# Patient Record
Sex: Male | Born: 1945 | Race: White | Hispanic: No | Marital: Married
Health system: Southern US, Academic
[De-identification: ages and names within clinical notes are randomized; demographics above are authoritative.]

## PROBLEM LIST (undated history)

## (undated) ENCOUNTER — Ambulatory Visit: Payer: MEDICARE

## (undated) ENCOUNTER — Encounter

## (undated) ENCOUNTER — Encounter: Attending: Family | Primary: Family

## (undated) ENCOUNTER — Telehealth

## (undated) ENCOUNTER — Encounter: Attending: Anatomic Pathology & Clinical Pathology | Primary: Anatomic Pathology & Clinical Pathology

## (undated) ENCOUNTER — Telehealth: Attending: Medical Oncology | Primary: Medical Oncology

## (undated) ENCOUNTER — Ambulatory Visit

## (undated) ENCOUNTER — Ambulatory Visit: Payer: MEDICARE | Attending: Neurological Surgery | Primary: Neurological Surgery

## (undated) ENCOUNTER — Telehealth
Attending: Student in an Organized Health Care Education/Training Program | Primary: Student in an Organized Health Care Education/Training Program

## (undated) ENCOUNTER — Ambulatory Visit
Attending: Student in an Organized Health Care Education/Training Program | Primary: Student in an Organized Health Care Education/Training Program

## (undated) DIAGNOSIS — I350 Nonrheumatic aortic (valve) stenosis: Secondary | ICD-10-CM

## (undated) DIAGNOSIS — I10 Essential (primary) hypertension: Secondary | ICD-10-CM

## (undated) DIAGNOSIS — M4800 Spinal stenosis, site unspecified: Secondary | ICD-10-CM

## (undated) DIAGNOSIS — Z85528 Personal history of other malignant neoplasm of kidney: Secondary | ICD-10-CM

## (undated) DIAGNOSIS — N184 Chronic kidney disease, stage 4 (severe): Secondary | ICD-10-CM

## (undated) DIAGNOSIS — I739 Peripheral vascular disease, unspecified: Secondary | ICD-10-CM

## (undated) DIAGNOSIS — K449 Diaphragmatic hernia without obstruction or gangrene: Secondary | ICD-10-CM

## (undated) DIAGNOSIS — I441 Atrioventricular block, second degree: Secondary | ICD-10-CM

## (undated) DIAGNOSIS — F410 Panic disorder [episodic paroxysmal anxiety] without agoraphobia: Secondary | ICD-10-CM

## (undated) DIAGNOSIS — Z8719 Personal history of other diseases of the digestive system: Secondary | ICD-10-CM

## (undated) DIAGNOSIS — R413 Other amnesia: Secondary | ICD-10-CM

## (undated) DIAGNOSIS — M3131 Wegener's granulomatosis with renal involvement: Secondary | ICD-10-CM

## (undated) DIAGNOSIS — F419 Anxiety disorder, unspecified: Secondary | ICD-10-CM

## (undated) DIAGNOSIS — N200 Calculus of kidney: Secondary | ICD-10-CM

## (undated) DIAGNOSIS — I4821 Permanent atrial fibrillation: Secondary | ICD-10-CM

## (undated) DIAGNOSIS — E6609 Other obesity due to excess calories: Secondary | ICD-10-CM

## (undated) DIAGNOSIS — I4891 Unspecified atrial fibrillation: Secondary | ICD-10-CM

## (undated) DIAGNOSIS — J449 Chronic obstructive pulmonary disease, unspecified: Secondary | ICD-10-CM

## (undated) DIAGNOSIS — E559 Vitamin D deficiency, unspecified: Secondary | ICD-10-CM

## (undated) DIAGNOSIS — M26629 Arthralgia of temporomandibular joint, unspecified side: Secondary | ICD-10-CM

## (undated) DIAGNOSIS — R001 Bradycardia, unspecified: Secondary | ICD-10-CM

## (undated) DIAGNOSIS — N189 Chronic kidney disease, unspecified: Secondary | ICD-10-CM

## (undated) DIAGNOSIS — I2510 Atherosclerotic heart disease of native coronary artery without angina pectoris: Secondary | ICD-10-CM

## (undated) DIAGNOSIS — I5022 Chronic systolic (congestive) heart failure: Secondary | ICD-10-CM

## (undated) DIAGNOSIS — Z950 Presence of cardiac pacemaker: Secondary | ICD-10-CM

## (undated) DIAGNOSIS — M109 Gout, unspecified: Secondary | ICD-10-CM

## (undated) DIAGNOSIS — E785 Hyperlipidemia, unspecified: Secondary | ICD-10-CM

## (undated) DIAGNOSIS — Z87442 Personal history of urinary calculi: Secondary | ICD-10-CM

## (undated) DIAGNOSIS — K8050 Calculus of bile duct without cholangitis or cholecystitis without obstruction: Secondary | ICD-10-CM

## (undated) DIAGNOSIS — K429 Umbilical hernia without obstruction or gangrene: Secondary | ICD-10-CM

## (undated) DIAGNOSIS — K219 Gastro-esophageal reflux disease without esophagitis: Secondary | ICD-10-CM

## (undated) DIAGNOSIS — G8929 Other chronic pain: Secondary | ICD-10-CM

## (undated) DIAGNOSIS — R55 Syncope and collapse: Secondary | ICD-10-CM

## (undated) DIAGNOSIS — M25559 Pain in unspecified hip: Secondary | ICD-10-CM

## (undated) HISTORY — PX: NOSE SURGERY: SHX723

## (undated) HISTORY — PX: CHOLECYSTECTOMY: SHX55

## (undated) HISTORY — DX: Umbilical hernia without obstruction or gangrene: K42.9

## (undated) HISTORY — DX: Hyperlipidemia, unspecified: E78.5

## (undated) HISTORY — DX: Vitamin D deficiency, unspecified: E55.9

## (undated) HISTORY — DX: Arthralgia of temporomandibular joint, unspecified side: M26.629

## (undated) HISTORY — DX: Chronic kidney disease, stage 4 (severe): N18.4

## (undated) HISTORY — PX: PROSTATE SURGERY: SHX751

## (undated) HISTORY — DX: Chronic kidney disease, unspecified: N18.9

## (undated) HISTORY — PX: HERNIA REPAIR: SHX51

## (undated) HISTORY — DX: Chronic obstructive pulmonary disease, unspecified: J44.9

## (undated) HISTORY — DX: Gout, unspecified: M10.9

## (undated) HISTORY — PX: CARDIAC CATHETERIZATION: SHX172

## (undated) HISTORY — DX: Nonrheumatic aortic (valve) stenosis: I35.0

## (undated) HISTORY — DX: Atherosclerotic heart disease of native coronary artery without angina pectoris: I25.10

## (undated) HISTORY — PX: EYE SURGERY: SHX253

## (undated) HISTORY — DX: Atrioventricular block, second degree: I44.1

## (undated) HISTORY — PX: HIP SURGERY: SHX245

## (undated) HISTORY — DX: Calculus of kidney: N20.0

## (undated) HISTORY — PX: KIDNEY SURGERY: SHX687

## (undated) HISTORY — DX: Other obesity due to excess calories: E66.09

## (undated) HISTORY — DX: Diaphragmatic hernia without obstruction or gangrene: K44.9

## (undated) HISTORY — DX: Calculus of bile duct without cholangitis or cholecystitis without obstruction: K80.50

## (undated) HISTORY — DX: Essential (primary) hypertension: I10

## (undated) HISTORY — DX: Spinal stenosis, site unspecified: M48.00

## (undated) HISTORY — DX: Gastro-esophageal reflux disease without esophagitis: K21.9

## (undated) HISTORY — PX: INTRAOCULAR LENS INSERTION: SHX110

## (undated) HISTORY — DX: Wegener's granulomatosis with renal involvement: M31.31

## (undated) HISTORY — DX: Personal history of other diseases of the digestive system: Z87.19

---
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
## 1998-09-10 ENCOUNTER — Ambulatory Visit (HOSPITAL_COMMUNITY): Admission: RE | Admit: 1998-09-10 | Discharge: 1998-09-10 | Payer: Self-pay | Admitting: *Deleted

## 2002-12-18 HISTORY — PX: CORONARY ARTERY BYPASS GRAFT: SHX141

## 2006-01-08 ENCOUNTER — Encounter: Admission: RE | Admit: 2006-01-08 | Discharge: 2006-01-08 | Payer: Self-pay | Admitting: Cardiovascular Disease

## 2006-01-12 ENCOUNTER — Inpatient Hospital Stay (HOSPITAL_COMMUNITY): Admission: RE | Admit: 2006-01-12 | Discharge: 2006-01-20 | Payer: Self-pay | Admitting: Cardiovascular Disease

## 2006-02-09 ENCOUNTER — Encounter: Admission: RE | Admit: 2006-02-09 | Discharge: 2006-02-09 | Payer: Self-pay | Admitting: Cardiothoracic Surgery

## 2006-07-31 ENCOUNTER — Inpatient Hospital Stay (HOSPITAL_COMMUNITY): Admission: RE | Admit: 2006-07-31 | Discharge: 2006-08-03 | Payer: Self-pay | Admitting: Orthopaedic Surgery

## 2006-08-06 ENCOUNTER — Ambulatory Visit: Payer: Self-pay | Admitting: Orthopaedic Surgery

## 2006-11-30 ENCOUNTER — Emergency Department: Payer: Self-pay | Admitting: Unknown Physician Specialty

## 2007-02-01 ENCOUNTER — Other Ambulatory Visit: Payer: Self-pay

## 2007-02-08 ENCOUNTER — Ambulatory Visit: Payer: Self-pay | Admitting: Surgery

## 2007-11-22 ENCOUNTER — Ambulatory Visit: Payer: Self-pay | Admitting: Endocrinology

## 2008-01-02 ENCOUNTER — Encounter: Payer: Self-pay | Admitting: Cardiovascular Disease

## 2008-01-03 ENCOUNTER — Encounter: Payer: Self-pay | Admitting: Cardiovascular Disease

## 2008-10-08 ENCOUNTER — Ambulatory Visit: Payer: Self-pay | Admitting: Urology

## 2008-10-14 ENCOUNTER — Ambulatory Visit: Payer: Self-pay | Admitting: Urology

## 2009-02-08 ENCOUNTER — Encounter: Admission: RE | Admit: 2009-02-08 | Discharge: 2009-02-08 | Payer: Self-pay | Admitting: Orthopaedic Surgery

## 2009-11-19 ENCOUNTER — Encounter: Payer: Self-pay | Admitting: Cardiovascular Disease

## 2011-01-08 ENCOUNTER — Encounter: Payer: Self-pay | Admitting: Orthopaedic Surgery

## 2011-07-14 ENCOUNTER — Ambulatory Visit: Payer: Self-pay | Admitting: Urology

## 2011-07-17 ENCOUNTER — Ambulatory Visit: Payer: Self-pay | Admitting: Urology

## 2011-12-25 ENCOUNTER — Ambulatory Visit: Payer: Self-pay | Admitting: Cardiovascular Disease

## 2012-01-08 ENCOUNTER — Encounter: Payer: Self-pay | Admitting: Cardiovascular Disease

## 2012-01-08 ENCOUNTER — Ambulatory Visit (INDEPENDENT_AMBULATORY_CARE_PROVIDER_SITE_OTHER): Payer: Medicare Other | Admitting: Cardiovascular Disease

## 2012-01-08 DIAGNOSIS — Z951 Presence of aortocoronary bypass graft: Secondary | ICD-10-CM | POA: Insufficient documentation

## 2012-01-08 DIAGNOSIS — E785 Hyperlipidemia, unspecified: Secondary | ICD-10-CM | POA: Insufficient documentation

## 2012-01-08 DIAGNOSIS — I10 Essential (primary) hypertension: Secondary | ICD-10-CM | POA: Insufficient documentation

## 2012-01-08 DIAGNOSIS — I2510 Atherosclerotic heart disease of native coronary artery without angina pectoris: Secondary | ICD-10-CM | POA: Insufficient documentation

## 2012-01-08 NOTE — Assessment & Plan Note (Signed)
Blood pressure is well controlled on today's visit. No changes made to the medications. 

## 2012-01-08 NOTE — Patient Instructions (Signed)
You are doing well. Please take a 1/2 tab of vytorin once a day If you have muscle ache, take a few days off.  Try red yeast rice for cholesterol  Please call us if you have new issues that need to be addressed before your next appt.  Your physician wants you to follow-up in: 6 months.  You will receive a reminder letter in the mail two months in advance. If you don't receive a letter, please call our office to schedule the follow-up appointment.

## 2012-01-08 NOTE — Assessment & Plan Note (Signed)
Cholesterol is elevated. Is not taking any medications. He did not tolerate pravastatin or Crestor. He did not tolerate simvastatin 40 mg. We will try vytorin 10/40 mg cut in half to start.

## 2012-01-08 NOTE — Assessment & Plan Note (Signed)
Bypass performed in 2007 at [facility]. No complications since that time.

## 2012-01-08 NOTE — Assessment & Plan Note (Addendum)
Severe proximal LAD and distal RCA disease in 2007, status post bypass. Rare episodes of chest pain. We did discuss whether he needs a stress test. He is not sure if he needs one at this time. We have asked him to call our office if he has any worsening chest discomfort and we would schedule a study. This could also be done at his primary care physician's office.  We have suggested he try low-dose aspirin.

## 2012-01-08 NOTE — Progress Notes (Signed)
[name], male    DOB: Sep 27, 1946, 66 y.o.   MRN: [id number]  HPI Comments: Jesus Hendrix is a very pleasant 66 year old gentleman, patient of Dr. 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  He denies any active chest pain though does report having episodes of chest pain over the past several months. No significant shortness of breath though he has not been very active secondary to his back with significant limitations in his back and legs. He is unable to do any exercise secondary to severe back pain. He is considering surgery.  Previous cardiac catheterization in January 2007 details 75-80% long lesion in the LAD at the ostium, 95% lesion in the PDA. He was referred to bypass surgery.  Previous echocardiogram January 2009 showed mild MR, normal LV function  Stress test January 2009 showed large region of decreased perfusion mild to moderate in severity in the inferior wall consistent with possible old MI. Defect worse in the inferoapical region. No ischemia. Ejection fraction 55%. Exercised for 7 METS  Recent blood work showed total cholesterol 246, LDL 158, HDL 45, TSH 1.8, normal LFTs, vitamin D 28th, fasting glucose 131  EKG shows normal sinus rhythm with rate 67 beats per minute, left axis deviation, unable to rule out old anterior infarct   Outpatient Encounter Prescriptions as of 01/08/2012  Medication Sig Dispense Refill  . cholecalciferol (VITAMIN D) 1000 UNITS tablet Take 1,000 Units by mouth daily.      . Famotidine (PEPCID PO) Take by mouth daily.      . FUROSEMIDE PO Take 20 mg by mouth daily.       Marland Kitchen losartan (COZAAR) 50 MG tablet Take 50 mg by mouth daily.      . mycophenolate (CELLCEPT) 500 MG tablet Take 500 mg by mouth 2 (two) times daily.      Marland Kitchen NIACIN PO Take by mouth daily.        . pantoprazole (PROTONIX) 40 MG tablet Take 40 mg by mouth daily.      . Tamsulosin HCl (FLOMAX) 0.4 MG CAPS Take 0.4 mg by mouth daily.        Review of Systems  Constitutional: Negative.   HENT: Negative.   Eyes: Negative.   Respiratory: Negative.   Cardiovascular: Negative.        Rare episodes of chest pain  Gastrointestinal: Negative.   Musculoskeletal: Positive for back pain and gait problem.  Skin: Negative.   Neurological: Negative.   Hematological: Negative.   Psychiatric/Behavioral: Negative.   All other systems reviewed and are negative.     BP 132/60  Pulse 67  Ht 5\' 7"  (1.702 m)  Wt 211 lb 12.8 oz (96.072 kg)  BMI 33.17 kg/m2 Physical Exam  Nursing note and vitals reviewed. Constitutional: He is oriented to person, place, and time. He appears well-developed and well-nourished.  HENT:  Head: Normocephalic.  Nose: Nose normal.  Mouth/Throat: Oropharynx is clear and moist.  Eyes: Conjunctivae are normal. Pupils are equal, round, and reactive to light.  Neck: Normal range of motion. Neck supple. No JVD present.  Cardiovascular: Normal rate, regular rhythm, S1 normal, S2 normal, normal heart sounds and intact distal pulses.  Exam reveals no gallop and no friction rub.   No murmur heard. Pulmonary/Chest: Effort normal and breath sounds normal. No respiratory distress. He has no  wheezes. He has no rales. He exhibits no tenderness.  Abdominal: Soft. Bowel sounds are normal. He exhibits no distension. There is no tenderness.  Musculoskeletal: Normal range of motion. He exhibits no edema and no tenderness.  Lymphadenopathy:    He has no cervical adenopathy.  Neurological: He is alert and oriented to person, place, and time. Coordination normal.  Skin: Skin is warm and dry. No rash noted. No erythema.  Psychiatric: He has a normal mood and affect. His behavior is normal. Judgment and thought content normal.           Assessment and Plan

## 2012-12-02 ENCOUNTER — Telehealth: Payer: Self-pay | Admitting: Cardiovascular Disease

## 2012-12-02 NOTE — Telephone Encounter (Signed)
Pt says he had a sensation of "needles pricking me" 2 nights ago in chest He then tells me he has hda, what feels like," indigestion" over the last 1-2 days He has hx hiatal hernia and is unsure if this is related He had 3 VCABG in 2007 and was asymptomatic prior to CABG He denies DOE or pain/numbness in extreme ties Has not seen Dr. Rockey Situ in almost a year Has appt with him next week I advised pt to come on in tomm since symptoms are concerning for cardiac cause Pt reassures me he has been "working all day" without symptoms He was advised to go to ER tonight should symptoms get worse/change otherwise we will see him tomm Understanding verb

## 2012-12-02 NOTE — Telephone Encounter (Signed)
Pt called stating that pt had some pains in chest felt like bee stings few days ago. Been having chest discomfort since then.

## 2012-12-03 ENCOUNTER — Encounter: Payer: Self-pay | Admitting: Cardiovascular Disease

## 2012-12-03 ENCOUNTER — Ambulatory Visit (INDEPENDENT_AMBULATORY_CARE_PROVIDER_SITE_OTHER): Payer: Medicare Other | Admitting: Cardiovascular Disease

## 2012-12-03 VITALS — BP 120/70 | HR 65 | Ht 67.0 in | Wt 204.0 lb

## 2012-12-03 DIAGNOSIS — I2510 Atherosclerotic heart disease of native coronary artery without angina pectoris: Secondary | ICD-10-CM

## 2012-12-03 DIAGNOSIS — I10 Essential (primary) hypertension: Secondary | ICD-10-CM

## 2012-12-03 DIAGNOSIS — R079 Chest pain, unspecified: Secondary | ICD-10-CM

## 2012-12-03 DIAGNOSIS — E785 Hyperlipidemia, unspecified: Secondary | ICD-10-CM

## 2012-12-03 MED ORDER — EZETIMIBE-SIMVASTATIN 10-80 MG PO TABS: 1.0000 | ORAL_TABLET | Freq: Every day | ORAL | Status: DC

## 2012-12-03 MED ORDER — EZETIMIBE-SIMVASTATIN 10-40 MG PO TABS: 0.5000 | ORAL_TABLET | Freq: Every day | ORAL | Status: DC

## 2012-12-03 NOTE — Patient Instructions (Addendum)
You are doing well. Please increase vytorin dose and take 1/2 pill daily  Please call us if you have new issues that need to be addressed before your next appt.  Your physician wants you to follow-up in: 6 months.  You will receive a reminder letter in the mail two months in advance. If you don't receive a letter, please call our office to schedule the follow-up appointment.

## 2012-12-03 NOTE — Assessment & Plan Note (Signed)
Blood pressure is well controlled on today's visit. No changes made to the medications. 

## 2012-12-03 NOTE — Assessment & Plan Note (Signed)
Currently with no symptoms of angina. No further workup at this time. Continue current medication regimen. Very active at his job.

## 2012-12-03 NOTE — Progress Notes (Signed)
[name], male    DOB: 07/30/1946, 66 y.o.   MRN: [id number]  HPI Comments: Jesus Hendrix is a very pleasant 66 year old gentleman, patient of Dr. 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  He is working long hours Jesus Hendrix, working with Jesus Hendrix, Jesus Hendrix, digging. He is significant stress recently as one of his family members who used to work with him is now working at a different job and he is working on his own. He has had palpitations at nighttime, strong beats, lying in bed with stress. He does report one episode of tingling in his chest. No symptoms with exertion.  Previous cardiac catheterization in January 2007 details 75-80% long lesion in the LAD at the ostium, 95% lesion in the PDA. He was referred to bypass surgery.  Previous echocardiogram January 2009 showed mild MR, normal LV function  Stress test January 2009 showed large region of decreased perfusion mild to moderate in severity in the inferior wall consistent with possible old MI. Defect worse in the inferoapical region. No ischemia. Ejection fraction 55%. Exercised for 7 METS  Previous blood work showed total cholesterol 246, LDL 158, HDL 45, TSH 1.8, normal LFTs, vitamin D 28th, fasting glucose 131 Most recent cholesterol 180s, LDL 122  EKG shows normal sinus rhythm with rate 65 beats per minute, left axis deviation, unable to rule out old anterior infarct   Outpatient Encounter Prescriptions as of 12/03/2012  Medication Sig Dispense Refill  . aspirin 81 MG tablet Take 81 mg by mouth daily.      . cholecalciferol (VITAMIN D) 1000 UNITS tablet Take 1,000 Units by mouth daily.      Marland Kitchen ezetimibe-simvastatin (VYTORIN) 10-40 MG per tablet Take 0.5 tablets by mouth at bedtime.  30 tablet  11  . Famotidine (PEPCID PO) Take by mouth daily.      .  FUROSEMIDE PO Take 20 mg by mouth daily.       Marland Kitchen losartan (COZAAR) 50 MG tablet Take 50 mg by mouth daily.      . mycophenolate (CELLCEPT) 500 MG tablet Take 500 mg by mouth 2 (two) times daily.      Marland Kitchen NIACIN PO Take by mouth daily.      . pantoprazole (PROTONIX) 40 MG tablet Take 40 mg by mouth daily.      . Tamsulosin HCl (FLOMAX) 0.4 MG CAPS Take 0.4 mg by mouth daily.      Marland Kitchen  ezetimibe-simvastatin (VYTORIN) 10-40 MG per tablet Take 0.5 tablets by mouth at bedtime.  30 tablet  11     Review of Systems  Constitutional: Negative.   HENT: Negative.   Eyes: Negative.   Respiratory: Negative.   Cardiovascular: Negative.        Rare episodes of chest pain  Gastrointestinal: Negative.   Musculoskeletal: Positive for back pain and gait problem.  Skin: Negative.   Neurological: Negative.   Hematological: Negative.   Psychiatric/Behavioral: Negative.   All other systems reviewed and are negative.     BP 120/70  Pulse 65  Ht 5\' 7"  (1.702 m)  Wt 204 lb (92.534 kg)  BMI 31.95 kg/m2 Physical Exam  Nursing note and vitals reviewed. Constitutional: He is oriented to person, place, and time. He appears well-developed and well-nourished.  HENT:  Head: Normocephalic.  Nose: Nose normal.  Mouth/Throat: Oropharynx is clear and moist.  Eyes: Conjunctivae normal are normal. Pupils are equal, round, and reactive to light.  Neck: Normal range of motion. Neck supple. No JVD present.  Cardiovascular: Normal rate, regular rhythm, S1 normal, S2 normal, normal heart sounds and intact distal pulses.  Exam reveals no gallop and no friction rub.   No murmur heard. Pulmonary/Chest: Effort normal and breath sounds normal. No respiratory distress. He has no wheezes. He has no rales. He exhibits no tenderness.  Abdominal: Soft. Bowel sounds are normal. He exhibits no distension. There is no tenderness.  Musculoskeletal: Normal range of motion. He exhibits no edema and no tenderness.  Lymphadenopathy:     He has no cervical adenopathy.  Neurological: He is alert and oriented to person, place, and time. Coordination normal.  Skin: Skin is warm and dry. No rash noted. No erythema.  Psychiatric: He has a normal mood and affect. His behavior is normal. Judgment and thought content normal.           Assessment and Plan

## 2012-12-03 NOTE — Assessment & Plan Note (Signed)
Cholesterol still above goal. We have written a prescription for Vytorin 10/80 mg daily and have suggested he take half. So far he has tolerated Vytorin 5/20 daily. Increasing to 5/40.

## 2012-12-12 ENCOUNTER — Ambulatory Visit: Payer: Medicare Other | Admitting: Cardiovascular Disease

## 2013-03-17 ENCOUNTER — Ambulatory Visit: Payer: Self-pay | Admitting: Urology

## 2013-04-07 ENCOUNTER — Telehealth: Payer: Self-pay

## 2013-04-07 NOTE — Telephone Encounter (Signed)
Pt wife called and states pt is having kidney surgery, and needs cardiac clearance. Please call. Wants to know if he needs a stress test before this surgery. Surgery on may 5

## 2013-04-07 NOTE — Telephone Encounter (Signed)
Please advise 

## 2013-04-08 NOTE — Telephone Encounter (Signed)
If he is doing the same as before with no significant changes from his prior visit at the end of 2013, no stress test needed. Acceptable risk for surgery

## 2013-04-09 NOTE — Telephone Encounter (Signed)
Pt informed Denies new symptoms Says he is unsure of surgeon's name but he is with [facility].  We will fax to them

## 2013-05-21 ENCOUNTER — Telehealth: Payer: Self-pay

## 2013-05-21 NOTE — Telephone Encounter (Signed)
Pt says he is having more frequent dizzy spells and "passed out" for "a second" this pas weekend. Having dizziness when up walking around denies chest pain or sob Says he spoke with Dr. Gwenlyn Found who advised he see [place] to discuss He is having surg on his kidney in [place] next Thursday and needs to be seen sooner since he is having symptoms Due for 6 month f/u this month Scheduled for Friday 6/6 at [place] I advised he stay well hydrated and go to ER should symptoms return prior to appt Understanding verb

## 2013-05-21 NOTE — Telephone Encounter (Signed)
Pt states he needs to see dr Rockey Situ asap, states he is having dizzy spells,  passed out 1 time, and is having a "kidney operation" next Thursday,needs clearance. please call

## 2013-05-23 ENCOUNTER — Ambulatory Visit (INDEPENDENT_AMBULATORY_CARE_PROVIDER_SITE_OTHER): Payer: Medicare Other | Admitting: Cardiovascular Disease

## 2013-05-23 ENCOUNTER — Encounter: Payer: Self-pay | Admitting: Cardiovascular Disease

## 2013-05-23 VITALS — BP 130/64 | HR 68 | Ht 67.0 in | Wt 206.8 lb

## 2013-05-23 DIAGNOSIS — M3130 Wegener's granulomatosis without renal involvement: Secondary | ICD-10-CM | POA: Insufficient documentation

## 2013-05-23 DIAGNOSIS — I10 Essential (primary) hypertension: Secondary | ICD-10-CM

## 2013-05-23 DIAGNOSIS — E785 Hyperlipidemia, unspecified: Secondary | ICD-10-CM

## 2013-05-23 DIAGNOSIS — R42 Dizziness and giddiness: Secondary | ICD-10-CM

## 2013-05-23 DIAGNOSIS — R55 Syncope and collapse: Secondary | ICD-10-CM

## 2013-05-23 DIAGNOSIS — I2510 Atherosclerotic heart disease of native coronary artery without angina pectoris: Secondary | ICD-10-CM

## 2013-05-23 MED ORDER — ATORVASTATIN CALCIUM 20 MG PO TABS: 20.0000 mg | ORAL_TABLET | Freq: Every day | ORAL | Status: DC

## 2013-05-23 NOTE — Assessment & Plan Note (Signed)
Currently with no symptoms of angina. No further workup at this time. Continue current medication regimen.  Acceptable risk for upcoming kidney surgery. No further testing needed. No active angina and he is active at work.

## 2013-05-23 NOTE — Assessment & Plan Note (Signed)
He has cut back on CellCept on his own and did not tell anyone. 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 I've suggested he share this with his rheumatologist. Dizziness has persisted, slightly improved over the past week. Uncertain if this is from decreasing his CellCept dose.

## 2013-05-23 NOTE — Assessment & Plan Note (Signed)
He is unable to afford Vytorin. Also having cramps. We will start low-dose generic Lipitor 10 mg daily for several weeks titrating up to 20 mg daily.

## 2013-05-23 NOTE — Assessment & Plan Note (Signed)
Blood pressure is well controlled on today's visit. No changes made to the medications. 

## 2013-05-23 NOTE — Progress Notes (Signed)
[name], male    DOB: [birth year], 67 y.o.   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  Recently started having sweats at nighttime again  He cut the CellCept dose in half after recent diagnosis of kidney tumor . He is being scheduled for laser ablation of his kidney tumor at [facility] . Also having some dizziness, spinning, worse when he turns his head . He wonders if it could be from cutting back on his CellCept and recurrence of his Wegener's . He does state that the dizziness is getting slightly better over the past week.  Previous cardiac catheterization in January 2007 details 75-80% long lesion in the LAD at the ostium, 95% lesion in the PDA. He was referred to bypass surgery. Previous echocardiogram January 2009 showed mild MR, normal LV function  Stress test January 2009 showed large region of decreased perfusion mild to moderate in severity in the inferior wall consistent with possible old MI. Defect worse in the inferoapical region. No ischemia. Ejection fraction 55%. Exercised for 7 METS   cholesterol 180s, LDL 122 He recently stopped his cholesterol medication as it was expensive, also was having muscle cramps.  EKG shows normal sinus rhythm with rate 70 beats per minute, left axis deviation, unable to rule out old anterior infarct   Outpatient Encounter Prescriptions as of 05/23/2013  Medication Sig Dispense Refill  . aspirin 81 MG tablet Take 81 mg by mouth daily.      . cholecalciferol (VITAMIN D) 1000 UNITS  tablet Take 1,000 Units by mouth daily.      . Famotidine (PEPCID PO) Take by mouth daily.      . FUROSEMIDE PO Take 20 mg by mouth daily.       Marland Kitchen losartan (COZAAR) 50 MG tablet Take 50 mg by mouth daily.      . mycophenolate (CELLCEPT) 500 MG tablet Take 500 mg by mouth daily.       Marland Kitchen NIACIN PO Take by mouth daily.      . pantoprazole (PROTONIX) 40 MG tablet Take 40 mg by mouth daily.      . Tamsulosin HCl (FLOMAX) 0.4 MG CAPS Take 0.4 mg by mouth daily.      Marland Kitchen  ezetimibe-simvastatin (VYTORIN) 10-40 MG per tablet Take 0.5 tablets by mouth at bedtime.  30 tablet  11     Review of Systems  Constitutional: Negative.   HENT: Negative.   Eyes: Negative.   Respiratory: Negative.   Cardiovascular: Negative.        Rare episodes of chest pain  Gastrointestinal: Negative.   Musculoskeletal: Positive for myalgias, back pain and gait problem.  Skin: Negative.   Neurological: Positive for dizziness.  Psychiatric/Behavioral: Negative.   All other systems reviewed and are negative.   BP 130/64  Pulse 68  Ht 5\' 7"  (1.702 m)  Wt 206 lb 12 oz (93.781 kg)  BMI 32.37 kg/m2  Physical Exam  Nursing note and vitals reviewed. Constitutional: He is oriented to person, place, and time. He appears  well-developed and well-nourished.  HENT:  Head: Normocephalic.  Nose: Nose normal.  Mouth/Throat: Oropharynx is clear and moist.  Eyes: Conjunctivae are normal. Pupils are equal, round, and reactive to light.  Neck: Normal range of motion. Neck supple. No JVD present.  Cardiovascular: Normal rate, regular rhythm, S1 normal, S2 normal and intact distal pulses.  Exam reveals no gallop and no friction rub.   Murmur heard.  Crescendo systolic murmur is present with a grade of 2/6  Pulmonary/Chest: Effort normal and breath sounds normal. No respiratory distress. He has no wheezes. He has no rales. He exhibits no tenderness.  Abdominal: Soft. Bowel sounds are normal. He exhibits no distension. There is no  tenderness.  Musculoskeletal: Normal range of motion. He exhibits no edema and no tenderness.  Lymphadenopathy:    He has no cervical adenopathy.  Neurological: He is alert and oriented to person, place, and time. Coordination normal.  Skin: Skin is warm and dry. No rash noted. No erythema.  Psychiatric: He has a normal mood and affect. His behavior is normal. Judgment and thought content normal.      Assessment and Plan

## 2013-05-23 NOTE — Patient Instructions (Signed)
You are doing well. Please start generic lipitor one a day  Please call us if you have new issues that need to be addressed before your next appt.  Your physician wants you to follow-up in: 6 months.  You will receive a reminder letter in the mail two months in advance. If you don't receive a letter, please call our office to schedule the follow-up appointment.

## 2013-10-21 ENCOUNTER — Telehealth: Payer: Self-pay

## 2013-10-21 NOTE — Telephone Encounter (Signed)
Spoke w/ pt.  Explained to him what LBBB was and possible symptoms.   He reports occasional dizziness "like when you shake your head too fast" over the past 3-4 months.  Explained lexiscan purpose and procedure.  Pt states that he would really like to talk to Dr. Rockey Situ and discuss what he feels is best.  Pt sched to see Dr. Rockey Situ tomorrow at 11:00.

## 2013-10-21 NOTE — Telephone Encounter (Signed)
Spoke w/ pt.  Informed him that Dr. Ronnald Collum sent over a copy of EKG done in his office 10/15/13.   Per Dr. Rockey Situ:  "New LBBB compared to previous EKG. Options include Do lexiscan stress test. If no symptoms, could just watch it."  Pt is due for 6 month f/u in a few weeks.  Pt states that he is not at home at the moment.   He will call when he gets home to let me know what he had decided.

## 2013-10-22 ENCOUNTER — Ambulatory Visit (INDEPENDENT_AMBULATORY_CARE_PROVIDER_SITE_OTHER): Payer: Medicare Other | Admitting: Cardiovascular Disease

## 2013-10-22 ENCOUNTER — Encounter: Payer: Self-pay | Admitting: Cardiovascular Disease

## 2013-10-22 VITALS — BP 130/62 | HR 72 | Ht 67.0 in | Wt 207.0 lb

## 2013-10-22 DIAGNOSIS — R079 Chest pain, unspecified: Secondary | ICD-10-CM

## 2013-10-22 DIAGNOSIS — I2510 Atherosclerotic heart disease of native coronary artery without angina pectoris: Secondary | ICD-10-CM

## 2013-10-22 DIAGNOSIS — E785 Hyperlipidemia, unspecified: Secondary | ICD-10-CM

## 2013-10-22 DIAGNOSIS — Z951 Presence of aortocoronary bypass graft: Secondary | ICD-10-CM

## 2013-10-22 DIAGNOSIS — I10 Essential (primary) hypertension: Secondary | ICD-10-CM

## 2013-10-22 DIAGNOSIS — I447 Left bundle-branch block, unspecified: Secondary | ICD-10-CM

## 2013-10-22 MED ORDER — ATORVASTATIN CALCIUM 20 MG PO TABS: 30.0000 mg | ORAL_TABLET | Freq: Every day | ORAL | Status: DC

## 2013-10-22 NOTE — Assessment & Plan Note (Signed)
Blood pressure is well controlled on today's visit. No changes made to the medications. 

## 2013-10-22 NOTE — Assessment & Plan Note (Addendum)
We have suggested he increase his Lipitor up to 30 mg daily as recent total cholesterol 170, goal less than 150

## 2013-10-22 NOTE — Assessment & Plan Note (Signed)
Etiology of his new left bundle branch block is unclear. Rare episodes of shortness of breath and chest tightness, typically with heavy exertion. We have suggested he repeat his stress test. This could be done with primary care or through [facility]. 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 He prefers the shortest time possible laying flat. We have suggested to him that the shortest time would probably be through the [facility] camera. At his request, a pharmacologic Myoview will be scheduled in the next week.

## 2013-10-22 NOTE — Assessment & Plan Note (Signed)
Myoview scheduled for symptoms and new left bundle branch block

## 2013-10-22 NOTE — Progress Notes (Signed)
[name], male    DOB: 23-Jan-1946, 67 y.o.   MRN: [id number]  HPI Comments: Mr. 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 Also with previous history of dizziness which she attributes to his Wegener's.  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 He does report having occasional tightness in his chest, some shortness of breath with heavy exertion.  He spends most of his time in the tractor . 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  Recent change in his EKG, now with left bundle branch block seen by Dr. Sabino Niemann.  He is concerned that this could be from his heart. Last stress test over 5 years ago  Previous cardiac catheterization in January 2007 details 75-80% long lesion in the LAD at the ostium, 95% lesion in the PDA. He was referred to bypass surgery. 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 Defect worse in the inferoapical region. No ischemia. Ejection fraction 55%. Exercised for 7 METS  He reports most recent cholesterol is 170  Previous history of myalgias on high-dose cholesterol medication  EKG shows normal sinus rhythm with rate 72 beats per minute,  left bundle branch blockt   Outpatient Encounter Prescriptions as of 10/22/2013  Medication Sig  . atorvastatin (LIPITOR) 20 MG tablet Take 1.5 tablets (30 mg total) by mouth daily.  . cholecalciferol (VITAMIN D) 1000 UNITS tablet Take 1,000 Units  by mouth daily.  . Famotidine (PEPCID PO) Take 20 mg by mouth daily.   . FUROSEMIDE PO Take 20 mg by mouth daily.   Marland Kitchen losartan (COZAAR) 50 MG tablet Take 25 mg by mouth daily.   . mycophenolate (CELLCEPT) 500 MG tablet Take 500 mg by mouth daily.   Marland Kitchen NIACIN PO Take 500 mg by mouth daily.   . pantoprazole (PROTONIX) 40 MG tablet Take 40 mg by mouth daily.  . Tamsulosin HCl (FLOMAX) 0.4 MG CAPS Take 0.4 mg by mouth daily.  Marland Kitchen zolpidem (AMBIEN) 5 MG tablet Take 5 mg by mouth at bedtime as needed for sleep.  . [DISCONTINUED] atorvastatin (LIPITOR) 20 MG tablet Take 1 tablet (20 mg total) by mouth daily.  . [DISCONTINUED] aspirin 81 MG tablet Take 81 mg by mouth daily.     Review of Systems  Constitutional: Negative.   HENT: Negative.   Eyes: Negative.   Respiratory: Positive for shortness of breath.   Cardiovascular: Negative.        Rare episodes of chest pain  Gastrointestinal: Negative.   Musculoskeletal: Positive for back pain, gait problem and myalgias.  Skin: Negative.   Neurological: Positive for dizziness.  Psychiatric/Behavioral: Negative.   All other systems reviewed and are negative.   BP 130/62  Pulse 72  Ht 5\' 7"  (1.702 m)  Wt 207 lb (93.895 kg)  BMI 32.41 kg/m2  Physical Exam  Nursing note and vitals reviewed. Constitutional: He is oriented to person, place, and time. He appears well-developed and well-nourished.  HENT:  Head: Normocephalic.  Nose: Nose  normal.  Mouth/Throat: Oropharynx is clear and moist.  Eyes: Conjunctivae are normal. Pupils are equal, round, and reactive to light.  Neck: Normal range of motion. Neck supple. No JVD present.  Cardiovascular: Normal rate, regular rhythm, S1 normal, S2 normal and intact distal pulses.  Exam reveals no gallop and no friction rub.   Murmur heard.  Crescendo systolic murmur is present with a grade of 2/6  Pulmonary/Chest: Effort normal and breath sounds normal. No respiratory distress. He has no wheezes. He has no  rales. He exhibits no tenderness.  Abdominal: Soft. Bowel sounds are normal. He exhibits no distension. There is no tenderness.  Musculoskeletal: Normal range of motion. He exhibits no edema and no tenderness.  Lymphadenopathy:    He has no cervical adenopathy.  Neurological: He is alert and oriented to person, place, and time. Coordination normal.  Skin: Skin is warm and dry. No rash noted. No erythema.  Psychiatric: He has a normal mood and affect. His behavior is normal. Judgment and thought content normal.      Assessment and Plan

## 2013-10-22 NOTE — Patient Instructions (Addendum)
You are doing well.  We will schedule a stress test, lexiscan No caffeine for 24 hours before the test No food the morning of the test  Please take 1/2 of the atorvastatin pills daily  Please call us if you have new issues that need to be addressed before your next appt.  Your physician wants you to follow-up in: 6 months.  You will receive a reminder letter in the mail two months in advance. If you don't receive a letter, please call our office to schedule the follow-up appointment.  [place]  Your caregiver has ordered a Stress Test with nuclear imaging. The purpose of this test is to evaluate the blood supply to your heart muscle. This procedure is referred to as a "Non-Invasive Stress Test." This is because other than having an IV started in your vein, nothing is inserted or "invades" your body. Cardiac stress tests are done to find areas of poor blood flow to the heart by determining the extent of coronary artery disease (CAD). Some patients exercise on a treadmill, which naturally increases the blood flow to your heart, while others who are  unable to walk on a treadmill due to physical limitations have a pharmacologic/chemical stress agent called Lexiscan . This medicine will mimic walking on a treadmill by temporarily increasing your coronary blood flow.   Please note: these test may take anywhere between 2-4 hours to complete  PLEASE REPORT TO [place] AT THE FIRST DESK WILL DIRECT YOU WHERE TO GO  Date of Procedure:________Tuesday, Nov 18______________  Arrival Time for Procedure:______7:00am_________________   PLEASE NOTIFY THE OFFICE AT LEAST 24 HOURS IN ADVANCE IF YOU ARE UNABLE TO KEEP YOUR APPOINTMENT.  [phone] AND  PLEASE NOTIFY NUCLEAR MEDICINE AT [facility] AT LEAST 24 HOURS IN ADVANCE IF YOU ARE UNABLE TO KEEP YOUR APPOINTMENT. [phone]  How to prepare for your Myoview test:  1. Do not eat or drink after midnight 2. No  caffeine for 24 hours prior to test 3. No smoking 24 hours prior to test. 4. Your medication may be taken with water.  If your doctor stopped a medication because of this test, do not take that medication. 5. Ladies, please do not wear dresses.  Skirts or pants are appropriate. Please wear a short sleeve shirt. 6. No perfume, cologne or lotion. 7. Wear comfortable walking shoes. No heels!

## 2013-11-04 ENCOUNTER — Ambulatory Visit: Payer: Self-pay | Admitting: Cardiovascular Disease

## 2013-11-04 ENCOUNTER — Other Ambulatory Visit: Payer: Self-pay

## 2013-11-04 DIAGNOSIS — I2510 Atherosclerotic heart disease of native coronary artery without angina pectoris: Secondary | ICD-10-CM

## 2013-11-04 DIAGNOSIS — I447 Left bundle-branch block, unspecified: Secondary | ICD-10-CM

## 2013-11-04 DIAGNOSIS — R079 Chest pain, unspecified: Secondary | ICD-10-CM

## 2013-11-19 ENCOUNTER — Telehealth: Payer: Self-pay | Admitting: *Deleted

## 2013-11-19 NOTE — Telephone Encounter (Signed)
Patient called wanting stress results.

## 2013-11-21 NOTE — Telephone Encounter (Signed)
Spoke w/ pt.  He is aware of results. Denies chest pain and reports that he is agreeable with plan and will call if symptoms occur.

## 2013-11-21 NOTE — Telephone Encounter (Signed)
Left message for pt to call back  °

## 2013-11-21 NOTE — Telephone Encounter (Signed)
Message copied by Stana Bunting on Fri Nov 21, 2013 11:14 AM ------      Message from: Minna Merritts      Created: Thu Nov 20, 2013 11:25 PM       Stress test is similar to previous stress test in 2009      There is old blockage in the inferior wall of the heart      If symptoms of chest are still happening,      Would probably recommend repeat cardiac cath to look at bypass grafts      If no significant chest pain, would wait on cath ------

## 2013-12-30 ENCOUNTER — Other Ambulatory Visit: Payer: Self-pay

## 2013-12-30 MED ORDER — ATORVASTATIN CALCIUM 20 MG PO TABS: 30.0000 mg | ORAL_TABLET | Freq: Every day | ORAL | Status: DC

## 2013-12-30 NOTE — Telephone Encounter (Signed)
Refill sent for atorvastatin. 

## 2014-03-13 ENCOUNTER — Other Ambulatory Visit: Payer: Self-pay

## 2014-03-13 MED ORDER — ATORVASTATIN CALCIUM 20 MG PO TABS: 30.0000 mg | ORAL_TABLET | Freq: Every day | ORAL | Status: DC

## 2014-03-13 NOTE — Telephone Encounter (Signed)
Refill sent for atorvastatin. 

## 2014-03-17 ENCOUNTER — Telehealth: Payer: Self-pay | Admitting: *Deleted

## 2014-03-17 NOTE — Telephone Encounter (Signed)
Would call patient and ask about his chest pain. Are sx stable. Stress test looked ok. Old scar.  If no new sx, ok to proceed. Acceptable risk.

## 2014-03-17 NOTE — Telephone Encounter (Signed)
Dr. Raynald Kemp called needing Surgical Clearance for Urological surgergy . Want to make sure patient's stress test is ok

## 2014-03-17 NOTE — Telephone Encounter (Signed)
[phone]  F) [phone]  Clearance letter and most recent EKG

## 2014-03-18 NOTE — Telephone Encounter (Signed)
Left message for pt to call back  °

## 2014-03-18 NOTE — Telephone Encounter (Signed)
Spoke w/ pt.  He reports that he has a "little cancer in my kidney and they want to make sure they got it all out." Denies any chest pain and would like to have clearance letter sent out as soon as possible. Advised him that I would type and send to Dr. Sabino Snipes office as soon as I hang up the phone.

## 2014-04-13 ENCOUNTER — Telehealth: Payer: Self-pay

## 2014-04-13 NOTE — Telephone Encounter (Signed)
Spoke w/ pt.  Reports that he was seen at [facility] for f/u.  Reports that his doctor told him that his "heart is making a funny beat and doesn't sound good". Reports dizziness, sweating, and pain in his back on Sat while working in his yard.  Reports that he feels good now, he has a hiatal hernia that usually gives him problems and he feels that the pollen is bothering him more than usual and may be contributing to his symptoms.  Advised pt that if symptoms recur, worsen or continue, to call 911 immediately. He verbalizes understanding and will come in to see Kerin Ransom, [place] on Wed 5/29 @ 10:00.

## 2014-04-13 NOTE — Telephone Encounter (Signed)
Pt called and states he was seen in [facility] and he had a "irregular heartbeat" and wants to be seen this week. States he had CP, pain in back, SOB on Saturday. Please call.

## 2014-04-15 ENCOUNTER — Encounter: Payer: Self-pay | Admitting: Cardiology

## 2014-04-15 ENCOUNTER — Encounter (INDEPENDENT_AMBULATORY_CARE_PROVIDER_SITE_OTHER): Payer: Self-pay

## 2014-04-15 ENCOUNTER — Ambulatory Visit (INDEPENDENT_AMBULATORY_CARE_PROVIDER_SITE_OTHER): Payer: Medicare Other | Admitting: Cardiology

## 2014-04-15 VITALS — BP 120/70 | HR 56 | Ht 66.0 in | Wt 199.5 lb

## 2014-04-15 DIAGNOSIS — I447 Left bundle-branch block, unspecified: Secondary | ICD-10-CM | POA: Insufficient documentation

## 2014-04-15 DIAGNOSIS — I498 Other specified cardiac arrhythmias: Secondary | ICD-10-CM

## 2014-04-15 DIAGNOSIS — I10 Essential (primary) hypertension: Secondary | ICD-10-CM

## 2014-04-15 DIAGNOSIS — Z951 Presence of aortocoronary bypass graft: Secondary | ICD-10-CM

## 2014-04-15 DIAGNOSIS — R55 Syncope and collapse: Secondary | ICD-10-CM

## 2014-04-15 DIAGNOSIS — R001 Bradycardia, unspecified: Secondary | ICD-10-CM

## 2014-04-15 DIAGNOSIS — R0989 Other specified symptoms and signs involving the circulatory and respiratory systems: Secondary | ICD-10-CM

## 2014-04-15 DIAGNOSIS — R011 Cardiac murmur, unspecified: Secondary | ICD-10-CM

## 2014-04-15 DIAGNOSIS — I440 Atrioventricular block, first degree: Secondary | ICD-10-CM

## 2014-04-15 DIAGNOSIS — I499 Cardiac arrhythmia, unspecified: Secondary | ICD-10-CM

## 2014-04-15 NOTE — Assessment & Plan Note (Signed)
Low risk Myoview Nov 2014

## 2014-04-15 NOTE — Assessment & Plan Note (Signed)
Controlled.  

## 2014-04-15 NOTE — Assessment & Plan Note (Signed)
Rt CA bruit

## 2014-04-15 NOTE — Assessment & Plan Note (Signed)
PR 218

## 2014-04-15 NOTE — Assessment & Plan Note (Signed)
HR 53

## 2014-04-15 NOTE — Assessment & Plan Note (Signed)
AS murmur, last echo 2009 WNL

## 2014-04-15 NOTE — Assessment & Plan Note (Signed)
Near syncopal event last week while working in his yard

## 2014-04-15 NOTE — Patient Instructions (Signed)
Your physician has requested that you have an echocardiogram. Echocardiography is a painless test that uses sound waves to create images of your heart. It provides your doctor with information about the size and shape of your heart and how well your heart's chambers and valves are working. This procedure takes approximately one hour. There are no restrictions for this procedure. Your physician has requested that you have a carotid duplex. This test is an ultrasound of the carotid arteries in your neck. It looks at blood flow through these arteries that supply the brain with blood. Allow one hour for this exam. There are no restrictions or special instructions. Your physician has recommended that you wear an event monitor. Event monitors are medical devices that record the heart's electrical activity. Doctors most often [place] these monitors to diagnose arrhythmias. Arrhythmias are problems with the speed or rhythm of the heartbeat. The monitor is a small, portable device. You can wear one while you do your normal daily activities. This is usually used to diagnose what is causing palpitations/syncope (passing out). Your physician recommends that you schedule a follow-up appointment in: Dr Rockey Situ after tests

## 2014-04-15 NOTE — Progress Notes (Signed)
04/15/2014 Jesus Hendrix   1945-12-30  [id number]  Primary Physicia Lenard Simmer, MD Primary Cardiologist: Dr Rockey Situ  HPI:  Pleasant 68 y/o male followed by Dr Rockey Situ. He is S/P CABG in 2007. He had a low risk Myoview Nov 2014- he did have a small area of inferior ischemia. He has not had angina. He has baseline bradycardia, first degree AVB, and LBBB. Last Saturday he was working in his yard when he had a near syncopal spell. He describes becoming weak and broke out into a mild sweat. He went in to lay down and felt better. He denies chest pain or palpations. Also, he recently was told by his providers at [facility] that see him for Wegener's that "his heart didn't sound right". He has had no further episodes.    Current Outpatient Prescriptions  Medication Sig Dispense Refill  . atorvastatin (LIPITOR) 20 MG tablet Take 1.5 tablets (30 mg total) by mouth daily.  135 tablet  3  . cholecalciferol (VITAMIN D) 1000 UNITS tablet Take 1,000 Units by mouth daily.      . Famotidine (PEPCID PO) Take 20 mg by mouth daily.       . FUROSEMIDE PO Take 20 mg by mouth daily.       Marland Kitchen losartan (COZAAR) 50 MG tablet Take 25 mg by mouth daily.       . mycophenolate (CELLCEPT) 500 MG tablet Take 500 mg by mouth daily.       Marland Kitchen NIACIN PO Take 500 mg by mouth daily.       . pantoprazole (PROTONIX) 40 MG tablet Take 40 mg by mouth daily.      . Tamsulosin HCl (FLOMAX) 0.4 MG CAPS Take 0.4 mg by mouth daily.      Marland Kitchen zolpidem (AMBIEN) 5 MG tablet Take 5 mg by mouth at bedtime as needed for sleep.       No current facility-administered medications for this visit.    No Known Allergies  History   Social History  . Marital Status: Married    Spouse Name: N/A    Number of Children: N/A  . Years of Education: N/A   Occupational History  . Not on file.   Social History Main Topics  . Smoking status: Former Smoker -- 1.00 packs/day for 25 years    Types: Cigarettes    Quit date: 12/29/1974  . Smokeless  tobacco: Not on file  . Alcohol Use: Yes     Comment: social  . Drug Use: No  . Sexual Activity: Not on file   Other Topics Concern  . Not on file   Social History Narrative  . No narrative on file     Review of Systems: General: negative for chills, fever, night sweats or weight changes.  Cardiovascular: negative for chest pain, dyspnea on exertion, edema, orthopnea, palpitations, paroxysmal nocturnal dyspnea or shortness of breath Dermatological: negative for rash Respiratory: negative for cough or wheezing Urologic: negative for hematuria Abdominal: negative for nausea, vomiting, diarrhea, bright red blood per rectum, melena, or hematemesis Neurologic: negative for visual changes, syncope, or dizziness All other systems reviewed and are otherwise negative except as noted above.    Blood pressure 120/70, pulse 56, height 5\' 6"  (1.676 m), weight 199 lb 8 oz (90.493 kg).  General appearance: alert, cooperative and no distress Neck: Rt carotid bruit Lungs: clear to auscultation bilaterally Heart: regular rate and rhythm and 2/6 systolic murmur AOv area Neurologic: Grossly normal  EKG NSR, SB,  LBBB, first degree AVB  ASSESSMENT AND PLAN:   Near syncope Near syncopal event last week while working in his yard  S/P CABG x 3 Low risk Myoview Nov 2014  Murmur AS murmur, last echo 2009 WNL  HTN (hypertension) Controlled  Bruit Rt CA bruit  LBBB (left bundle branch block) .  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 F/U Dr Rockey Situ after tests.   Doreene Burke [facility] 04/15/2014 10:47 AM

## 2014-04-20 ENCOUNTER — Encounter: Payer: Self-pay | Admitting: *Deleted

## 2014-04-23 ENCOUNTER — Other Ambulatory Visit: Payer: Medicare Other

## 2014-04-29 ENCOUNTER — Ambulatory Visit: Payer: Medicare Other | Admitting: Cardiovascular Disease

## 2014-05-04 ENCOUNTER — Other Ambulatory Visit: Payer: Self-pay

## 2014-05-04 ENCOUNTER — Other Ambulatory Visit (INDEPENDENT_AMBULATORY_CARE_PROVIDER_SITE_OTHER): Payer: Medicare Other

## 2014-05-04 ENCOUNTER — Encounter (INDEPENDENT_AMBULATORY_CARE_PROVIDER_SITE_OTHER): Payer: Medicare Other

## 2014-05-04 DIAGNOSIS — R55 Syncope and collapse: Secondary | ICD-10-CM

## 2014-05-04 DIAGNOSIS — I2510 Atherosclerotic heart disease of native coronary artery without angina pectoris: Secondary | ICD-10-CM

## 2014-05-04 DIAGNOSIS — I6529 Occlusion and stenosis of unspecified carotid artery: Secondary | ICD-10-CM

## 2014-05-04 DIAGNOSIS — R011 Cardiac murmur, unspecified: Secondary | ICD-10-CM

## 2014-05-04 DIAGNOSIS — R0989 Other specified symptoms and signs involving the circulatory and respiratory systems: Secondary | ICD-10-CM

## 2014-05-13 ENCOUNTER — Ambulatory Visit: Payer: Medicare Other | Admitting: Cardiovascular Disease

## 2014-05-22 ENCOUNTER — Telehealth: Payer: Self-pay

## 2014-05-22 NOTE — Telephone Encounter (Signed)
Spoke with Gen with [employer] mail order to get prior authorization for atorvastatin 20 mg take 1.5 mg (30 mg) by mouth daily. Verification # is [id number]. We should receive authoriazation by fax.

## 2014-05-26 ENCOUNTER — Telehealth: Payer: Self-pay

## 2014-05-26 NOTE — Telephone Encounter (Signed)
Atorvastatin 20 mg has been approved through 05/23/2015. Patient is aware that atorvastatin is approved and will contact our office when he needs a refill.

## 2014-07-09 ENCOUNTER — Telehealth: Payer: Self-pay | Admitting: *Deleted

## 2014-07-09 NOTE — Telephone Encounter (Signed)
Patient never got Ecardio monitor due to insurance purposes.

## 2014-07-13 ENCOUNTER — Encounter: Payer: Self-pay | Admitting: Cardiovascular Disease

## 2014-07-13 ENCOUNTER — Ambulatory Visit (INDEPENDENT_AMBULATORY_CARE_PROVIDER_SITE_OTHER): Payer: Medicare Other | Admitting: Cardiovascular Disease

## 2014-07-13 VITALS — BP 120/62 | HR 61 | Ht 68.0 in | Wt 204.5 lb

## 2014-07-13 DIAGNOSIS — Z951 Presence of aortocoronary bypass graft: Secondary | ICD-10-CM

## 2014-07-13 DIAGNOSIS — I158 Other secondary hypertension: Secondary | ICD-10-CM

## 2014-07-13 DIAGNOSIS — I359 Nonrheumatic aortic valve disorder, unspecified: Secondary | ICD-10-CM

## 2014-07-13 DIAGNOSIS — I658 Occlusion and stenosis of other precerebral arteries: Secondary | ICD-10-CM

## 2014-07-13 DIAGNOSIS — I6529 Occlusion and stenosis of unspecified carotid artery: Secondary | ICD-10-CM

## 2014-07-13 DIAGNOSIS — I447 Left bundle-branch block, unspecified: Secondary | ICD-10-CM

## 2014-07-13 DIAGNOSIS — E785 Hyperlipidemia, unspecified: Secondary | ICD-10-CM

## 2014-07-13 DIAGNOSIS — I2510 Atherosclerotic heart disease of native coronary artery without angina pectoris: Secondary | ICD-10-CM

## 2014-07-13 DIAGNOSIS — I6523 Occlusion and stenosis of bilateral carotid arteries: Secondary | ICD-10-CM

## 2014-07-13 DIAGNOSIS — I350 Nonrheumatic aortic (valve) stenosis: Secondary | ICD-10-CM

## 2014-07-13 MED ORDER — EZETIMIBE 10 MG PO TABS: 10.0000 mg | ORAL_TABLET | Freq: Every day | ORAL | Status: DC

## 2014-07-13 NOTE — Assessment & Plan Note (Signed)
Prior bypass surgery in 2007. He has done well since that time. No symptoms concerning for angina on today's visit. No further testing ordered

## 2014-07-13 NOTE — Assessment & Plan Note (Signed)
Blood pressure is well controlled on today's visit. No changes made to the medications. 

## 2014-07-13 NOTE — Assessment & Plan Note (Signed)
Currently with no symptoms of angina. No further workup at this time. Continue current medication regimen. 

## 2014-07-13 NOTE — Assessment & Plan Note (Addendum)
Prior cholesterol from 2014 above goal. Hemoglobin A1c has decreased since that time. We'll try to find his most recent lipid panel. Goal LDL less than 70. Suggested he start zetia 10 mg daily to reach this goal. Coupon provided today for free 30 days

## 2014-07-13 NOTE — Progress Notes (Signed)
[name], male    DOB: May 26, 1946, 68 y.o.   MRN: [id number]  HPI Comments: Mr. 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 Also with previous history of dizziness which she attributes to his Wegener's.  In followup today, he reports that he is doing very well. 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 No significant chest tightness with exertion. He spends most of his time in the tractor . 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  Prior echocardiogram showing mild aortic valve stenosis earlier in 2015 Denies having any near syncope or syncope. Previous episode of vertigo which she attributed to sinus problem Hemoglobin A1c has improved from 6.2 down to 5.4 With the higher hemoglobin A1c, total cholesterol 180, LDL 116  Previous cardiac catheterization in January 2007 details 75-80% long lesion in the LAD at the ostium, 95% lesion in the PDA. He was referred to bypass surgery. 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 Defect worse in the inferoapical region. No ischemia. Ejection fraction 55%. Exercised for 7 METS  Previous history of myalgias on high-dose cholesterol medication  EKG shows normal sinus rhythm with rate 61 beats per minute,  left bundle branch blockt   Outpatient Encounter Prescriptions as of 07/13/2014  Medication Sig  . atorvastatin (LIPITOR) 20 MG tablet Take 1.5 tablets (30 mg  total) by mouth daily.  . cholecalciferol (VITAMIN D) 1000 UNITS tablet Take 1,000 Units by mouth daily.  . Famotidine (PEPCID PO) Take 20 mg by mouth daily.   . FUROSEMIDE PO Take 20 mg by mouth daily.   Marland Kitchen losartan (COZAAR) 50 MG tablet Take 25 mg by mouth daily.   . mycophenolate (CELLCEPT) 500 MG tablet Take 500 mg by mouth daily.   Marland Kitchen NIACIN PO Take 500 mg by mouth daily.   . pantoprazole (PROTONIX) 40 MG tablet Take 40 mg by mouth daily.  . Tamsulosin HCl (FLOMAX) 0.4 MG CAPS Take 0.4 mg by mouth daily.  Marland Kitchen zolpidem (AMBIEN) 5 MG tablet Take 5 mg by mouth at bedtime as needed for sleep.     Review of Systems  Constitutional: Negative.   HENT: Negative.   Eyes: Negative.   Respiratory: Negative.   Cardiovascular: Negative.        Rare episodes of chest pain  Gastrointestinal: Negative.   Endocrine: Negative.   Musculoskeletal: Positive for back pain, gait problem and myalgias.  Skin: Negative.   Allergic/Immunologic: Negative.   Neurological: Negative.   Hematological: Negative.   Psychiatric/Behavioral: Negative.   All other systems reviewed and are negative.  BP 120/62  Pulse 61  Ht 5\' 8"  (1.727 m)  Wt 204 lb 8 oz (92.761 kg)  BMI 31.10 kg/m2  Physical Exam  Nursing note and vitals reviewed. Constitutional: He is oriented to person, place, and time. He appears well-developed and well-nourished.  HENT:  Head: Normocephalic.  Nose: Nose normal.  Mouth/Throat: Oropharynx is clear and moist.  Eyes: Conjunctivae are normal. Pupils are equal, round,  and reactive to light.  Neck: Normal range of motion. Neck supple. No JVD present.  Cardiovascular: Normal rate, regular rhythm, S1 normal, S2 normal and intact distal pulses.  Exam reveals no gallop and no friction rub.   Murmur heard.  Crescendo systolic murmur is present with a grade of 2/6  Pulmonary/Chest: Effort normal and breath sounds normal. No respiratory distress. He has no wheezes. He has no rales. He exhibits no  tenderness.  Abdominal: Soft. Bowel sounds are normal. He exhibits no distension. There is no tenderness.  Musculoskeletal: Normal range of motion. He exhibits no edema and no tenderness.  Lymphadenopathy:    He has no cervical adenopathy.  Neurological: He is alert and oriented to person, place, and time. Coordination normal.  Skin: Skin is warm and dry. No rash noted. No erythema.  Psychiatric: He has a normal mood and affect. His behavior is normal. Judgment and thought content normal.      Assessment and Plan

## 2014-07-13 NOTE — Assessment & Plan Note (Addendum)
Recent echocardiogram may 2015 showing mild aortic valve stenosis. He does not appear to have any clinical symptoms at this time

## 2014-07-13 NOTE — Patient Instructions (Signed)
You are doing well. No medication changes were made.  Call if you would like zetia one a day for cholesterol  Please call us if you have new issues that need to be addressed before your next appt.  Your physician wants you to follow-up in: 6 months.  You will receive a reminder letter in the mail two months in advance. If you don't receive a letter, please call our office to schedule the follow-up appointment.

## 2014-07-13 NOTE — Assessment & Plan Note (Signed)
Recent carotid ultrasound may 2015 showing less than 39% bilateral carotid disease

## 2014-09-25 ENCOUNTER — Telehealth: Payer: Self-pay | Admitting: *Deleted

## 2014-09-25 NOTE — Telephone Encounter (Signed)
Per Dr. Rockey Situ, pt is acceptable risk to proceed w/ surgery, no further workup needed. Dan Maker of this, as well as faxed back to [facility] at [phone].

## 2014-09-25 NOTE — Telephone Encounter (Signed)
Form is in [place] folder waiting for signature.

## 2014-09-25 NOTE — Telephone Encounter (Signed)
LouAnn calling from [facility] needs a cardiac clearance form.  Needs this stat and is having surgery on Tues. Please call.

## 2014-12-09 ENCOUNTER — Telehealth: Payer: Self-pay | Admitting: *Deleted

## 2014-12-09 NOTE — Telephone Encounter (Signed)
Please see me regarding this.

## 2014-12-09 NOTE — Telephone Encounter (Signed)
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

## 2014-12-09 NOTE — Telephone Encounter (Signed)
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 Please call patient when letter is ready. Patient needs this done asap.

## 2014-12-10 NOTE — Telephone Encounter (Signed)
Letter typed and left at front desk for pt to pick up at his convenience.  He is appreciative and will come by before noon today.

## 2015-01-11 ENCOUNTER — Ambulatory Visit (INDEPENDENT_AMBULATORY_CARE_PROVIDER_SITE_OTHER): Payer: Medicare Other | Admitting: Cardiovascular Disease

## 2015-01-11 ENCOUNTER — Encounter: Payer: Self-pay | Admitting: Cardiovascular Disease

## 2015-01-11 VITALS — BP 118/66 | HR 61 | Ht 67.0 in | Wt 215.5 lb

## 2015-01-11 DIAGNOSIS — Z951 Presence of aortocoronary bypass graft: Secondary | ICD-10-CM

## 2015-01-11 DIAGNOSIS — I447 Left bundle-branch block, unspecified: Secondary | ICD-10-CM

## 2015-01-11 DIAGNOSIS — I6523 Occlusion and stenosis of bilateral carotid arteries: Secondary | ICD-10-CM

## 2015-01-11 DIAGNOSIS — I350 Nonrheumatic aortic (valve) stenosis: Secondary | ICD-10-CM

## 2015-01-11 DIAGNOSIS — I159 Secondary hypertension, unspecified: Secondary | ICD-10-CM

## 2015-01-11 DIAGNOSIS — I2510 Atherosclerotic heart disease of native coronary artery without angina pectoris: Secondary | ICD-10-CM

## 2015-01-11 DIAGNOSIS — E785 Hyperlipidemia, unspecified: Secondary | ICD-10-CM

## 2015-01-11 NOTE — Progress Notes (Signed)
[name], male    DOB: 27-Dec-1945, 69 y.o.   MRN: [id number]  HPI Comments: Mr. 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 Also with previous history of dizziness which she attributes to his Wegener's.   he reports that he is doing very well. 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 No significant chest tightness with exertion.  Recent lab work with hemoglobin A1c 5.6 Echocardiogram May 2015 with mild aortic valve stenosis, results were discussed with him  He denies having any near syncope or syncope.   EKG on today's visit showing normal sinus rhythm with rate 60 bpm, left bundle branch block  Previous cardiac catheterization in January 2007 details 75-80% long lesion in the LAD at the ostium, 95% lesion in the PDA. He was referred to bypass surgery. 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 Defect worse in the inferoapical region. No ischemia. Ejection fraction 55%. Exercised for 7 METS  Previous history of myalgias on high-dose cholesterol medication. Tolerating Lipitor 30 mg daily   No Known Allergies  Outpatient Encounter Prescriptions as of 01/11/2015  Medication Sig  . atorvastatin (LIPITOR) 20 MG tablet Take 1.5 tablets (30 mg total) by mouth daily.  . cholecalciferol (VITAMIN D) 1000 UNITS tablet Take 1,000 Units by mouth daily.  Marland Kitchen ezetimibe (ZETIA) 10 MG tablet Take 1 tablet (10 mg total) by mouth daily.   . FUROSEMIDE PO Take 20 mg by mouth daily.   Marland Kitchen losartan (COZAAR) 50 MG tablet Take 25 mg by mouth daily.   . mycophenolate (CELLCEPT) 500 MG tablet Take 500 mg by mouth 2 (two) times daily.   Marland Kitchen NIACIN PO Take 500 mg by mouth daily.   . pantoprazole (PROTONIX) 40 MG tablet Take 40 mg by mouth daily.  . Tamsulosin HCl (FLOMAX) 0.4 MG CAPS Take 0.4 mg by mouth daily.  Marland Kitchen zolpidem (AMBIEN) 5 MG tablet Take 5 mg by mouth at bedtime as needed for sleep.  . [DISCONTINUED] Famotidine (PEPCID PO) Take 20 mg by mouth daily.     Past Medical History  Diagnosis Date  . Hypertension   . Hyperlipidemia   . Coronary artery disease   . Hiatal hernia   . Heart murmur   . Chronic kidney disease   . Cancer of kidney   . Cardiomyopathy   . Choledocholithiasis   . COPD (chronic obstructive pulmonary disease)   . History of colonic diverticulitis   . DM (diabetes mellitus)   . GERD (gastroesophageal reflux disease)   . Gout   . Spinal stenosis   . Nephrolithiasis   . Umbilical hernia   . Vitamin D deficiency   . Wegener's granulomatosis with renal involvement   . Temporomandibular joint pain dysfunction syndrome   . Exogenous obesity     Past Surgical History  Procedure Laterality Date  . Prostate surgery    . Coronary artery bypass graft  2004    CABG x 3  . Cardiac catheterization    . Hernia repair  x 2  . Eye surgery    . Intraocular lens insertion    . Hip surgery      right hip replacement  . Kidney surgery      cancer  . Cholecystectomy      Social History  reports that he quit smoking about 40 years ago. His smoking use included Cigarettes. He has a 25 pack-year smoking history. He does not have any smokeless tobacco history on file. He reports that he drinks alcohol. He reports that he does not use illicit drugs.  Family History family history includes Heart disease in his brother, mother, and sister.   Review of Systems  Constitutional: Negative.   Eyes:  Negative.   Respiratory: Negative.   Cardiovascular: Negative.        Rare episodes of chest pain  Gastrointestinal: Negative.   Musculoskeletal: Positive for myalgias, back pain and gait problem.  Skin: Negative.   Allergic/Immunologic: Negative.   Neurological: Negative.   Hematological: Negative.   Psychiatric/Behavioral: Negative.   All other systems reviewed and are negative.  BP 118/66 mmHg  Pulse 61  Ht 5\' 7"  (1.702 m)  Wt 215 lb 8 oz (97.75 kg)  BMI 33.74 kg/m2  Physical Exam  Constitutional: He is oriented to person, place, and time. He appears well-developed and well-nourished.  HENT:  Head: Normocephalic.  Nose: Nose normal.  Mouth/Throat: Oropharynx is clear and moist.  Eyes: Conjunctivae are normal. Pupils are equal, round, and reactive to light.  Neck: Normal range of motion. Neck supple. No JVD present.  Cardiovascular: Normal rate, regular rhythm, S1 normal, S2 normal and intact distal pulses.  Exam reveals no gallop and no friction rub.   Murmur heard.  Crescendo systolic murmur is present with a grade of 2/6  Pulmonary/Chest: Effort normal and breath sounds normal. No respiratory distress. He has no wheezes. He has no rales. He exhibits no tenderness.  Abdominal: Soft. Bowel sounds are normal. He exhibits no distension. There is no tenderness.  Musculoskeletal: Normal range of motion. He exhibits no edema or tenderness.  Lymphadenopathy:    He has no cervical adenopathy.  Neurological: He is alert and oriented to person, place, and time. Coordination normal.  Skin: Skin is warm and dry. No rash noted. No erythema.  Psychiatric: He has a normal mood and affect. His behavior is normal. Judgment and thought content normal.      Assessment and Plan   Nursing note and vitals reviewed.

## 2015-01-11 NOTE — Patient Instructions (Addendum)
You are doing well. No medication changes were made.  Please call us if you have new issues that need to be addressed before your next appt.  Your physician wants you to follow-up in: 6 months.  You will receive a reminder letter in the mail two months in advance. If you don't receive a letter, please call our office to schedule the follow-up appointment.   

## 2015-01-11 NOTE — Assessment & Plan Note (Signed)
carotid ultrasound may 2015 showing less than 39% bilateral carotid disease

## 2015-01-11 NOTE — Assessment & Plan Note (Signed)
Currently with no symptoms of angina. No further workup at this time. Continue current medication regimen. 

## 2015-01-11 NOTE — Assessment & Plan Note (Signed)
Chronic left bundle branch block seen on EKG

## 2015-01-11 NOTE — Assessment & Plan Note (Signed)
Encouraged him to stay on his current Lipitor and zetia dosing. Lab work followed by Dr. Ronnald Collum.

## 2015-01-11 NOTE — Assessment & Plan Note (Signed)
Mild aortic valve stenosis May 2015. Not contributing to any clinical symptoms at this time

## 2015-01-11 NOTE — Assessment & Plan Note (Signed)
Blood pressure is well controlled on today's visit. No changes made to the medications. 

## 2015-05-27 ENCOUNTER — Encounter: Payer: Self-pay | Admitting: *Deleted

## 2015-06-02 NOTE — Discharge Instructions (Signed)

## 2015-06-03 ENCOUNTER — Ambulatory Visit
Admission: RE | Admit: 2015-06-03 | Discharge: 2015-06-03 | Disposition: A | Discharge status: Home / Self Care | Payer: Medicare Other | Source: Ambulatory Visit | Attending: Gastroenterology | Admitting: Gastroenterology

## 2015-06-03 ENCOUNTER — Ambulatory Visit: Payer: Medicare Other | Admitting: Student in an Organized Health Care Education/Training Program

## 2015-06-03 ENCOUNTER — Encounter
Admission: RE | Disposition: A | Discharge status: Home / Self Care | Payer: Self-pay | Source: Ambulatory Visit | Attending: Gastroenterology

## 2015-06-03 ENCOUNTER — Other Ambulatory Visit: Payer: Self-pay | Admitting: Gastroenterology

## 2015-06-03 DIAGNOSIS — Z8739 Personal history of other diseases of the musculoskeletal system and connective tissue: Secondary | ICD-10-CM | POA: Diagnosis not present

## 2015-06-03 DIAGNOSIS — K621 Rectal polyp: Secondary | ICD-10-CM | POA: Insufficient documentation

## 2015-06-03 DIAGNOSIS — J449 Chronic obstructive pulmonary disease, unspecified: Secondary | ICD-10-CM | POA: Insufficient documentation

## 2015-06-03 DIAGNOSIS — Z87891 Personal history of nicotine dependence: Secondary | ICD-10-CM | POA: Insufficient documentation

## 2015-06-03 DIAGNOSIS — E785 Hyperlipidemia, unspecified: Secondary | ICD-10-CM | POA: Diagnosis not present

## 2015-06-03 DIAGNOSIS — M2662 Arthralgia of temporomandibular joint: Secondary | ICD-10-CM | POA: Diagnosis not present

## 2015-06-03 DIAGNOSIS — E119 Type 2 diabetes mellitus without complications: Secondary | ICD-10-CM | POA: Diagnosis not present

## 2015-06-03 DIAGNOSIS — K219 Gastro-esophageal reflux disease without esophagitis: Secondary | ICD-10-CM | POA: Diagnosis not present

## 2015-06-03 DIAGNOSIS — Z96641 Presence of right artificial hip joint: Secondary | ICD-10-CM | POA: Diagnosis not present

## 2015-06-03 DIAGNOSIS — I129 Hypertensive chronic kidney disease with stage 1 through stage 4 chronic kidney disease, or unspecified chronic kidney disease: Secondary | ICD-10-CM | POA: Insufficient documentation

## 2015-06-03 DIAGNOSIS — K635 Polyp of colon: Secondary | ICD-10-CM | POA: Insufficient documentation

## 2015-06-03 DIAGNOSIS — Z1211 Encounter for screening for malignant neoplasm of colon: Secondary | ICD-10-CM | POA: Diagnosis not present

## 2015-06-03 DIAGNOSIS — T184XXA Foreign body in colon, initial encounter: Secondary | ICD-10-CM | POA: Diagnosis not present

## 2015-06-03 DIAGNOSIS — X58XXXA Exposure to other specified factors, initial encounter: Secondary | ICD-10-CM | POA: Insufficient documentation

## 2015-06-03 DIAGNOSIS — Z6832 Body mass index (BMI) 32.0-32.9, adult: Secondary | ICD-10-CM | POA: Diagnosis not present

## 2015-06-03 DIAGNOSIS — I2510 Atherosclerotic heart disease of native coronary artery without angina pectoris: Secondary | ICD-10-CM | POA: Insufficient documentation

## 2015-06-03 DIAGNOSIS — Z9049 Acquired absence of other specified parts of digestive tract: Secondary | ICD-10-CM | POA: Insufficient documentation

## 2015-06-03 DIAGNOSIS — N189 Chronic kidney disease, unspecified: Secondary | ICD-10-CM | POA: Diagnosis not present

## 2015-06-03 DIAGNOSIS — K640 First degree hemorrhoids: Secondary | ICD-10-CM | POA: Insufficient documentation

## 2015-06-03 DIAGNOSIS — I429 Cardiomyopathy, unspecified: Secondary | ICD-10-CM | POA: Diagnosis not present

## 2015-06-03 DIAGNOSIS — E559 Vitamin D deficiency, unspecified: Secondary | ICD-10-CM | POA: Diagnosis not present

## 2015-06-03 DIAGNOSIS — Z87442 Personal history of urinary calculi: Secondary | ICD-10-CM | POA: Diagnosis not present

## 2015-06-03 DIAGNOSIS — E6609 Other obesity due to excess calories: Secondary | ICD-10-CM | POA: Insufficient documentation

## 2015-06-03 DIAGNOSIS — Z85528 Personal history of other malignant neoplasm of kidney: Secondary | ICD-10-CM | POA: Insufficient documentation

## 2015-06-03 DIAGNOSIS — M4800 Spinal stenosis, site unspecified: Secondary | ICD-10-CM | POA: Diagnosis not present

## 2015-06-03 DIAGNOSIS — Z951 Presence of aortocoronary bypass graft: Secondary | ICD-10-CM | POA: Insufficient documentation

## 2015-06-03 DIAGNOSIS — D125 Benign neoplasm of sigmoid colon: Secondary | ICD-10-CM | POA: Insufficient documentation

## 2015-06-03 DIAGNOSIS — Z79899 Other long term (current) drug therapy: Secondary | ICD-10-CM | POA: Insufficient documentation

## 2015-06-03 DIAGNOSIS — M3131 Wegener's granulomatosis with renal involvement: Secondary | ICD-10-CM | POA: Diagnosis not present

## 2015-06-03 HISTORY — PX: COLONOSCOPY: SHX5424

## 2015-06-03 HISTORY — PX: POLYPECTOMY: SHX5525

## 2015-06-03 SURGERY — COLONOSCOPY
Anesthesia: Monitor Anesthesia Care | Wound class: Contaminated

## 2015-06-03 MED ORDER — LIDOCAINE HCL (CARDIAC) 20 MG/ML IV SOLN
INTRAVENOUS | Status: DC | PRN
  Administered 2015-06-03: 40 mg via INTRAVENOUS

## 2015-06-03 MED ORDER — PROPOFOL 10 MG/ML IV BOLUS
INTRAVENOUS | Status: DC | PRN
  Administered 2015-06-03 (×2): 50 mg via INTRAVENOUS
  Administered 2015-06-03: 100 mg via INTRAVENOUS
  Administered 2015-06-03: 50 mg via INTRAVENOUS

## 2015-06-03 MED ORDER — SIMETHICONE 40 MG/0.6ML PO SUSP
ORAL | Status: DC | PRN
  Administered 2015-06-03: 12:00:00

## 2015-06-03 MED ORDER — LACTATED RINGERS IV SOLN
INTRAVENOUS | Status: DC
  Administered 2015-06-03: 10:00:00 via INTRAVENOUS

## 2015-06-03 SURGICAL SUPPLY — 28 items
CANISTER SUCT 1200ML W/VALVE (MISCELLANEOUS) ×3 IMPLANT
FCP ESCP3.2XJMB 240X2.8X (MISCELLANEOUS)
FORCEPS BIOP RAD 4 LRG CAP 4 (CUTTING FORCEPS) IMPLANT
FORCEPS BIOP RJ4 240 W/NDL (MISCELLANEOUS)
FORCEPS ESCP3.2XJMB 240X2.8X (MISCELLANEOUS) IMPLANT
GOWN CVR UNV OPN BCK APRN NK (MISCELLANEOUS) ×4 IMPLANT
GOWN ISOL THUMB LOOP REG UNIV (MISCELLANEOUS) ×6
HEMOCLIP INSTINCT (CLIP) IMPLANT
INJECTOR VARIJECT VIN23 (MISCELLANEOUS) IMPLANT
KIT CO2 TUBING (TUBING) IMPLANT
KIT DEFENDO VALVE AND CONN (KITS) IMPLANT
KIT ENDO PROCEDURE OLY (KITS) ×3 IMPLANT
LIGATOR MULTIBAND 6SHOOTER MBL (MISCELLANEOUS) IMPLANT
MARKER SPOT ENDO TATTOO 5ML (MISCELLANEOUS) IMPLANT
PAD GROUND ADULT SPLIT (MISCELLANEOUS) IMPLANT
SNARE SHORT THROW 13M SML OVAL (MISCELLANEOUS) IMPLANT
SNARE SHORT THROW 30M LRG OVAL (MISCELLANEOUS) IMPLANT
SPOT EX ENDOSCOPIC TATTOO (MISCELLANEOUS)
SUCTION POLY TRAP 4CHAMBER (MISCELLANEOUS) IMPLANT
TRAP SUCTION POLY (MISCELLANEOUS) IMPLANT
TUBING CONN 6MMX3.1M (TUBING)
TUBING SUCTION CONN 0.25 STRL (TUBING) IMPLANT
UNDERPAD 30X60 958B10 (PK) (MISCELLANEOUS) IMPLANT
VALVE BIOPSY ENDO (VALVE) IMPLANT
VARIJECT INJECTOR VIN23 (MISCELLANEOUS)
WATER AUXILLARY (MISCELLANEOUS) IMPLANT
WATER STERILE IRR 250ML POUR (IV SOLUTION) ×3 IMPLANT
WATER STERILE IRR 500ML POUR (IV SOLUTION) IMPLANT

## 2015-06-03 NOTE — H&P (Signed)
[facility]  [address] [place], [place] [address] Phone: [phone] Fax : [phone]  Primary Care Physician:  Lenard Simmer, MD Primary Gastroenterologist:  Dr. Allen Norris  Pre-Procedure History & Physical: HPI:  Jesus Hendrix is a 69 y.o. male is here for a screening colonoscopy.   Past Medical History  Diagnosis Date  . Hypertension   . Hyperlipidemia   . Coronary artery disease   . Hiatal hernia   . Heart murmur   . Chronic kidney disease   . Cancer of kidney   . Cardiomyopathy   . Choledocholithiasis   . COPD (chronic obstructive pulmonary disease)   . History of colonic diverticulitis   . DM (diabetes mellitus)   . GERD (gastroesophageal reflux disease)   . Gout   . Spinal stenosis   . Nephrolithiasis   . Umbilical hernia   . Vitamin D deficiency   . Wegener's granulomatosis with renal involvement   . Temporomandibular joint pain dysfunction syndrome   . Exogenous obesity     Past Surgical History  Procedure Laterality Date  . Prostate surgery    . Coronary artery bypass graft  2004    CABG x 3  . Cardiac catheterization    . Hernia repair      x 2  . Eye surgery    . Intraocular lens insertion    . Hip surgery      right hip replacement  . Kidney surgery      cancer  . Cholecystectomy      Prior to Admission medications   Medication Sig Start Date End Date Taking? Authorizing Provider  atorvastatin (LIPITOR) 20 MG tablet Take 1.5 tablets (30 mg total) by mouth daily. 03/13/14  Yes Minna Merritts, MD  cholecalciferol (VITAMIN D) 1000 UNITS tablet Take 1,000 Units by mouth daily. PM   Yes Historical Provider, MD  Famotidine (PEPCID PO) Take by mouth. AM   Yes Historical Provider, MD  fluticasone (FLONASE) 50 MCG/ACT nasal spray Place into both nostrils daily. PM   Yes Historical Provider, MD  FUROSEMIDE PO Take 20 mg by mouth daily. PM   Yes Historical Provider, MD  losartan (COZAAR) 50 MG tablet Take 25 mg by mouth  daily. PM   Yes Historical Provider, MD  mycophenolate (CELLCEPT) 500 MG tablet Take 250 mg by mouth 2 (two) times daily.    Yes Historical Provider, MD  Tamsulosin HCl (FLOMAX) 0.4 MG CAPS Take 0.4 mg by mouth daily. AM   Yes Historical Provider, MD  ezetimibe (ZETIA) 10 MG tablet Take 1 tablet (10 mg total) by mouth daily. 07/13/14   Minna Merritts, MD  NIACIN PO Take 500 mg by mouth daily.     Historical Provider, MD  pantoprazole (PROTONIX) 40 MG tablet Take 40 mg by mouth daily.    Historical Provider, MD  zolpidem (AMBIEN) 5 MG tablet Take 5 mg by mouth at bedtime as needed for sleep.    Historical Provider, MD    Allergies as of 05/03/2015  . (No Known Allergies)    Family History  Problem Relation Age of Onset  . Heart disease Mother   . Heart disease Sister   . Heart disease Brother     History   Social History  . Marital Status: Married    Spouse Name: N/A  . Number of Children: N/A  . Years of Education: N/A   Occupational History  . Not on file.   Social History Main Topics  .  Smoking status: Former Smoker -- 1.00 packs/day for 25 years    Types: Cigarettes    Quit date: 12/29/1974  . Smokeless tobacco: Not on file  . Alcohol Use: 1.2 oz/week    2 Cans of beer per week     Comment: social  . Drug Use: No  . Sexual Activity: Not on file   Other Topics Concern  . Not on file   Social History Narrative    Review of Systems: See HPI, otherwise negative ROS  Physical Exam: BP 168/68 mmHg  Pulse 69  Temp(Src) 97.5 F (36.4 C) (Temporal)  Resp 16  Ht 5\' 7"  (1.702 m)  Wt 208 lb (94.348 kg)  BMI 32.57 kg/m2  SpO2 99% General:   Alert,  pleasant and cooperative in NAD Head:  Normocephalic and atraumatic. Neck:  Supple; no masses or thyromegaly. Lungs:  Clear throughout to auscultation.    Heart:  Regular rate and rhythm. Abdomen:  Soft, nontender and nondistended. Normal bowel sounds, without guarding, and without rebound.   Neurologic:  Alert and   oriented x4;  grossly normal neurologically.  Impression/Plan: Jesus Hendrix is now here to undergo a screening colonoscopy.  Risks, benefits, and alternatives regarding colonoscopy have been reviewed with the patient.  Questions have been answered.  All parties agreeable.

## 2015-06-03 NOTE — Anesthesia Preprocedure Evaluation (Signed)
Anesthesia Evaluation  Patient identified by MRN, date of birth, ID band Patient awake    Reviewed: Allergy & Precautions, NPO status , Patient's Chart, lab work & pertinent test results  Airway Mallampati: II  TM Distance: >3 FB Neck ROM: Full    Dental no notable dental hx.    Pulmonary COPDformer smoker,  breath sounds clear to auscultation  Pulmonary exam normal       Cardiovascular Exercise Tolerance: Good hypertension, + CAD and + Peripheral Vascular Disease Normal cardiovascular exam+ dysrhythmias + Valvular Problems/Murmurs AS Rhythm:Regular Rate:Normal  Pt has excellent functional capacity, lays cement foundations daily.  No changes in current cardiac care.  No chest pain since CABG X 3.  Left BBB.  Aortic stenosis causes his murmur.     Neuro/Psych negative neurological ROS  negative psych ROS   GI/Hepatic negative GI ROS, Neg liver ROS, hiatal hernia, GERD-  ,  Endo/Other  negative endocrine ROSdiabetes  Renal/GU Renal diseasenegative Renal ROSWegener's granulomatosis.  One kidney currently.  negative genitourinary   Musculoskeletal negative musculoskeletal ROS (+)   Abdominal   Peds negative pediatric ROS (+)  Hematology negative hematology ROS (+)   Anesthesia Other Findings   Reproductive/Obstetrics negative OB ROS                             Anesthesia Physical Anesthesia Plan  ASA: III  Anesthesia Plan: MAC   Post-op Pain Management:    Induction: Intravenous  Airway Management Planned: Natural Airway  Additional Equipment:   Intra-op Plan:   Post-operative Plan:   Informed Consent: I have reviewed the patients History and Physical, chart, labs and discussed the procedure including the risks, benefits and alternatives for the proposed anesthesia with the patient or authorized representative who has indicated his/her understanding and acceptance.   Dental  advisory given  Plan Discussed with: CRNA  Anesthesia Plan Comments:         Anesthesia Quick Evaluation

## 2015-06-03 NOTE — Anesthesia Postprocedure Evaluation (Signed)
  Anesthesia Post-op Note  Patient: Jesus Hendrix  Procedure(s) Performed: Procedure(s): COLONOSCOPY (N/A) POLYPECTOMY  Anesthesia type:MAC  Patient location: PACU  Post pain: Pain level controlled  Post assessment: Post-op Vital signs reviewed, Patient's Cardiovascular Status Stable, Respiratory Function Stable, Patent Airway and No signs of Nausea or vomiting  Post vital signs: Reviewed and stable  Last Vitals:  Filed Vitals:   06/03/15 1218  BP:   Pulse: 43  Temp: 36.5 C  Resp: 14    Level of consciousness: awake, alert  and patient cooperative  Complications: No apparent anesthesia complications

## 2015-06-03 NOTE — Transfer of Care (Signed)
Immediate Anesthesia Transfer of Care Note  Patient: Jesus Hendrix  Procedure(s) Performed: Procedure(s): COLONOSCOPY (N/A) POLYPECTOMY  Patient Location: PACU  Anesthesia Type: MAC  Level of Consciousness: awake, alert  and patient cooperative  Airway and Oxygen Therapy: Patient Spontanous Breathing and Patient connected to supplemental oxygen  Post-op Assessment: Post-op Vital signs reviewed, Patient's Cardiovascular Status Stable, Respiratory Function Stable, Patent Airway and No signs of Nausea or vomiting  Post-op Vital Signs: Reviewed and stable  Complications: No apparent anesthesia complications

## 2015-06-03 NOTE — Op Note (Signed)
[facility] Patient Name: Jesus Hendrix Procedure Date: 06/03/2015 11:39 AM MRN: [id number] Account #: [id number] Date of Birth: [birth year] Admit Type: Outpatient Age: 69 Room: [facility] OR ROOM 01 Gender: Male Note Status: Finalized Procedure:         Colonoscopy Indications:       Screening for colorectal malignant neoplasm Providers:         Lucilla Lame, MD Medicines:         Propofol per Anesthesia Complications:     No immediate complications. Procedure:         Pre-Anesthesia Assessment:                    - Prior to the procedure, a History and Physical was                     performed, and patient medications and allergies were                     reviewed. The patient's tolerance of previous anesthesia                     was also reviewed. The risks and benefits of the procedure                     and the sedation options and risks were discussed with the                     patient. All questions were answered, and informed consent                     was obtained. Prior Anticoagulants: The patient has taken                     no previous anticoagulant or antiplatelet agents. ASA                     Grade Assessment: II - A patient with mild systemic                     disease. After reviewing the risks and benefits, the                     patient was deemed in satisfactory condition to undergo                     the procedure.                    After obtaining informed consent, the colonoscope was                     passed under direct vision. Throughout the procedure, the                     patient's blood pressure, pulse, and oxygen saturations                     were monitored continuously. The Olympus CF H180AL                     colonoscope (S#: [id number]) was introduced through the anus                     and advanced to the the cecum, identified by  appendiceal                     orifice and ileocecal valve. The colonoscopy  was performed                     without difficulty. The patient tolerated the procedure                     well. The quality of the bowel preparation was excellent. Findings:      The perianal and digital rectal examinations were normal.      A 4 mm polyp was found in the sigmoid colon. The polyp was sessile. The       polyp was removed with a cold biopsy forceps. Resection and retrieval       were complete.      A 3 mm polyp was found in the rectum. The polyp was sessile. The polyp       was removed with a cold biopsy forceps. Resection and retrieval were       complete.      Non-bleeding internal hemorrhoids were found during retroflexion. The       hemorrhoids were Grade I (internal hemorrhoids that do not prolapse).      A foreign body was found in the descending colon. Impression:        - One 4 mm polyp in the sigmoid colon. Resected and                     retrieved.                    - One 3 mm polyp in the rectum. Resected and retrieved.                    - Non-bleeding internal hemorrhoids.                    - Foreign body in the descending colon. Recommendation:    - Await pathology results.                    - Repeat colonoscopy in 5 years if polyp adenoma and 10                     years if hyperplastic Procedure Code(s): --- Professional ---                    [phone], Colonoscopy, flexible; with biopsy, single or                     multiple Diagnosis Code(s): --- Professional ---                    Z12.11, Encounter for screening for malignant neoplasm of                     colon                    D12.5, Benign neoplasm of sigmoid colon                    K62.1, Rectal polyp CPT copyright 2014 [employer]. All rights reserved. The codes documented in this report are preliminary and upon coder review may  be revised to meet current compliance requirements. Lucilla Lame, MD 06/03/2015 12:05:28 PM  This report has been signed  electronically. Number of Addenda: 0 Note Initiated On: 06/03/2015 11:39 AM Scope Withdrawal Time: 0 hours 8 minutes 2 seconds  Total Procedure Duration: 0 hours 11 minutes 10 seconds       [facility]

## 2015-06-03 NOTE — Anesthesia Procedure Notes (Signed)
Procedure Name: MAC Performed by: Kella Splinter Pre-anesthesia Checklist: Patient identified, Emergency Drugs available, Suction available, Timeout performed and Patient being monitored Patient Re-evaluated:Patient Re-evaluated prior to inductionOxygen Delivery Method: Nasal cannula Placement Confirmation: positive ETCO2     

## 2015-06-04 ENCOUNTER — Encounter: Payer: Self-pay | Admitting: Gastroenterology

## 2015-06-15 ENCOUNTER — Encounter: Payer: Self-pay | Admitting: Gastroenterology

## 2015-11-15 ENCOUNTER — Ambulatory Visit (INDEPENDENT_AMBULATORY_CARE_PROVIDER_SITE_OTHER): Payer: Medicare Other | Admitting: Cardiovascular Disease

## 2015-11-15 ENCOUNTER — Encounter: Payer: Self-pay | Admitting: Cardiovascular Disease

## 2015-11-15 VITALS — BP 125/68 | HR 69 | Ht 67.0 in | Wt 162.8 lb

## 2015-11-15 DIAGNOSIS — I159 Secondary hypertension, unspecified: Secondary | ICD-10-CM | POA: Diagnosis not present

## 2015-11-15 DIAGNOSIS — M3130 Wegener's granulomatosis without renal involvement: Secondary | ICD-10-CM | POA: Diagnosis not present

## 2015-11-15 DIAGNOSIS — Z01810 Encounter for preprocedural cardiovascular examination: Secondary | ICD-10-CM

## 2015-11-15 DIAGNOSIS — I447 Left bundle-branch block, unspecified: Secondary | ICD-10-CM | POA: Diagnosis not present

## 2015-11-15 DIAGNOSIS — I6523 Occlusion and stenosis of bilateral carotid arteries: Secondary | ICD-10-CM

## 2015-11-15 DIAGNOSIS — E785 Hyperlipidemia, unspecified: Secondary | ICD-10-CM

## 2015-11-15 DIAGNOSIS — I2510 Atherosclerotic heart disease of native coronary artery without angina pectoris: Secondary | ICD-10-CM | POA: Diagnosis not present

## 2015-11-15 DIAGNOSIS — I350 Nonrheumatic aortic (valve) stenosis: Secondary | ICD-10-CM

## 2015-11-15 MED ORDER — ATORVASTATIN CALCIUM 40 MG PO TABS: 40.0000 mg | ORAL_TABLET | Freq: Every day | ORAL | Status: DC

## 2015-11-15 MED ORDER — EZETIMIBE 10 MG PO TABS: 10.0000 mg | ORAL_TABLET | Freq: Every day | ORAL | Status: DC

## 2015-11-15 NOTE — Assessment & Plan Note (Signed)
Recommended that he increase his Lipitor up to 40 mg daily

## 2015-11-15 NOTE — Progress Notes (Signed)
[name], male    DOB: [birth year], 69 y.o.   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  previous history of dizziness which he attributes to his Wegener's.   he reports that he is doing very well. 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 No significant chest tightness with exertion. 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 He was referred to bypass surgery. 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 Defect worse in the inferoapical region. No ischemia. Ejection fraction 55%. Exercised for 7 METS  Previous history of myalgias on high-dose cholesterol medication. Tolerating Lipitor 30 mg daily   No Known Allergies  Outpatient Encounter Prescriptions as of 11/15/2015  Medication Sig  . atorvastatin (LIPITOR) 40 MG tablet Take 1 tablet (40 mg total) by mouth  daily.  . cholecalciferol (VITAMIN D) 1000 UNITS tablet Take 1,000 Units by mouth daily. PM  . ezetimibe (ZETIA) 10 MG tablet Take 1 tablet (10 mg total) by mouth daily.  . Famotidine (PEPCID PO) Take by mouth. AM  . fluticasone (FLONASE) 50 MCG/ACT nasal spray Place into both nostrils daily. PM  . furosemide (LASIX) 20 MG tablet Take 20 mg by mouth daily.  Marland Kitchen losartan (COZAAR) 50 MG tablet Take 25 mg by mouth daily. PM  . mycophenolate (CELLCEPT) 500 MG tablet Take 250 mg by mouth 2 (two) times daily.   Marland Kitchen NIACIN PO Take 500 mg by mouth daily.   . pantoprazole (PROTONIX) 40 MG tablet Take 40 mg by mouth daily.  . tamsulosin (FLOMAX) 0.4 MG CAPS capsule Take 0.4 mg by mouth daily.  . [DISCONTINUED] atorvastatin (LIPITOR) 20 MG tablet Take 1.5 tablets (30 mg total) by mouth daily.  . [DISCONTINUED] atorvastatin (LIPITOR) 40 MG tablet Take 1 tablet (40 mg total) by mouth daily.  . [DISCONTINUED] ezetimibe (ZETIA) 10 MG tablet Take 1 tablet (10 mg total) by mouth daily.  . [DISCONTINUED] ezetimibe (ZETIA) 10 MG tablet Take 1 tablet (10 mg total) by mouth daily.  . [DISCONTINUED] FUROSEMIDE PO Take 20 mg by mouth daily. PM  . [DISCONTINUED] Tamsulosin HCl (FLOMAX) 0.4 MG CAPS Take 0.4 mg by mouth daily. AM  . [DISCONTINUED] zolpidem (AMBIEN) 5 MG tablet Take 5 mg by mouth at bedtime as needed for sleep.   No facility-administered encounter medications on file as of 11/15/2015.    Past Medical History  Diagnosis Date  . Hypertension   . Hyperlipidemia   . Coronary artery disease   .  Hiatal hernia   . Heart murmur   . Chronic kidney disease   . Cancer of kidney ([place])   . Cardiomyopathy ([place])   . Choledocholithiasis   . COPD (chronic obstructive pulmonary disease) ([place])   . History of colonic diverticulitis   . DM (diabetes mellitus) ([place])   . GERD (gastroesophageal reflux disease)   . Gout   . Spinal stenosis   . Nephrolithiasis   . Umbilical hernia   . Vitamin D deficiency   .  Wegener's granulomatosis with renal involvement ([place])   . Temporomandibular joint pain dysfunction syndrome   . Exogenous obesity     Past Surgical History  Procedure Laterality Date  . Prostate surgery    . Coronary artery bypass graft  2004    CABG x 3  . Cardiac catheterization    . Hernia repair      x 2  . Eye surgery    . Intraocular lens insertion    . Hip surgery      right hip replacement  . Kidney surgery      cancer  . Cholecystectomy    . Colonoscopy N/A 06/03/2015    Procedure: COLONOSCOPY;  Surgeon: Lucilla Lame, MD;  Location: [place];  Service: Gastroenterology;  Laterality: N/A;  . Polypectomy  06/03/2015    Procedure: POLYPECTOMY;  Surgeon: Lucilla Lame, MD;  Location: [place];  Service: Gastroenterology;;    Social History  reports that he quit smoking about 40 years ago. His smoking use included Cigarettes. He has a 25 pack-year smoking history. He does not have any smokeless tobacco history on file. He reports that he drinks about 1.2 oz of alcohol per week. He reports that he does not use illicit drugs.  Family History family history includes Heart disease in his brother, mother, and sister.   Review of Systems  Constitutional: Negative.   Eyes: Negative.   Respiratory: Negative.   Cardiovascular: Negative.        Rare episodes of chest pain  Gastrointestinal: Negative.   Musculoskeletal: Positive for myalgias, back pain and gait problem.  Skin: Negative.   Allergic/Immunologic: Negative.   Neurological: Negative.   Hematological: Negative.   Psychiatric/Behavioral: Negative.   All other systems reviewed and are negative.  BP 125/68 mmHg  Pulse 69  Ht 5\' 7"  (1.702 m)  Wt 162 lb 12.8 oz (73.846 kg)  BMI 25.49 kg/m2  Physical Exam  Constitutional: He is oriented to person, place, and time. He appears well-developed and well-nourished.  HENT:  Head: Normocephalic.  Nose: Nose normal.  Mouth/Throat: Oropharynx is clear  and moist.  Eyes: Conjunctivae are normal. Pupils are equal, round, and reactive to light.  Neck: Normal range of motion. Neck supple. No JVD present.  Cardiovascular: Normal rate, regular rhythm, S1 normal, S2 normal and intact distal pulses.  Exam reveals no gallop and no friction rub.   Murmur heard.  Crescendo systolic murmur is present with a grade of 2/6  Pulmonary/Chest: Effort normal and breath sounds normal. No respiratory distress. He has no wheezes. He has no rales. He exhibits no tenderness.  Abdominal: Soft. Bowel sounds are normal. He exhibits no distension. There is no tenderness.  Musculoskeletal: Normal range of motion. He exhibits no edema or tenderness.  Lymphadenopathy:    He has no cervical adenopathy.  Neurological: He is alert and oriented to person, place, and time. Coordination normal.  Skin: Skin is warm and dry. No rash noted. No erythema.  Psychiatric: He  has a normal mood and affect. His behavior is normal. Judgment and thought content normal.      Assessment and Plan   Nursing note and vitals reviewed.

## 2015-11-15 NOTE — Assessment & Plan Note (Signed)
No change compared to prior ekg. No new sx concerning for angina

## 2015-11-15 NOTE — Assessment & Plan Note (Signed)
Murmur on exam consistent with mild aortic valve stenosis We will repeat echocardiogram every several years, currently asymptomatic

## 2015-11-15 NOTE — Assessment & Plan Note (Signed)
Reports he is off cellcept. No readmission.

## 2015-11-15 NOTE — Assessment & Plan Note (Signed)
Upcoming kidney surveillance procedure tomorrow at [facility] Doing very well, no angina with heavy stress/physical labor. No further testing needed

## 2015-11-15 NOTE — Assessment & Plan Note (Signed)
Currently with no symptoms of angina. No further workup at this time. Continue current medication regimen. 

## 2015-11-15 NOTE — Patient Instructions (Addendum)
You are doing well. Please increase Lipitor to 40 mg daily  Please call us if you have new issues that need to be addressed before your next appt.  Your physician wants you to follow-up in: 6 months.  You will receive a reminder letter in the mail two months in advance. If you don't receive a letter, please call our office to schedule the follow-up appointment.

## 2015-11-15 NOTE — Assessment & Plan Note (Signed)
Blood pressure is well controlled on today's visit. No changes made to the medications. 

## 2016-05-26 ENCOUNTER — Telehealth: Payer: Self-pay | Admitting: Cardiovascular Disease

## 2016-05-26 NOTE — Telephone Encounter (Signed)
Received pre-operative clearance request from [facility] as pt will undergo left total hip replacement. Pt last seen Nov 2016 w/instructions to f/u 6 months. Pt needs appt w/Dr. Rockey Situ. Forwarded to scheduling.

## 2016-05-26 NOTE — Telephone Encounter (Signed)
Pt is coming 06/08/16 to see Dr Rockey Situ

## 2016-06-06 ENCOUNTER — Emergency Department: Payer: Medicare Other

## 2016-06-06 ENCOUNTER — Telehealth: Payer: Self-pay | Admitting: Cardiovascular Disease

## 2016-06-06 ENCOUNTER — Observation Stay
Admission: EM | Admit: 2016-06-06 | Discharge: 2016-06-06 | Disposition: A | Discharge status: Home / Self Care | Payer: Medicare Other | Attending: Internal Medicine | Admitting: Internal Medicine

## 2016-06-06 ENCOUNTER — Other Ambulatory Visit: Payer: Self-pay

## 2016-06-06 ENCOUNTER — Encounter: Payer: Self-pay | Admitting: Emergency Medicine

## 2016-06-06 DIAGNOSIS — Z79899 Other long term (current) drug therapy: Secondary | ICD-10-CM | POA: Diagnosis not present

## 2016-06-06 DIAGNOSIS — E6609 Other obesity due to excess calories: Secondary | ICD-10-CM | POA: Diagnosis not present

## 2016-06-06 DIAGNOSIS — M4800 Spinal stenosis, site unspecified: Secondary | ICD-10-CM | POA: Diagnosis not present

## 2016-06-06 DIAGNOSIS — E785 Hyperlipidemia, unspecified: Secondary | ICD-10-CM | POA: Diagnosis not present

## 2016-06-06 DIAGNOSIS — E559 Vitamin D deficiency, unspecified: Secondary | ICD-10-CM | POA: Diagnosis not present

## 2016-06-06 DIAGNOSIS — E1122 Type 2 diabetes mellitus with diabetic chronic kidney disease: Secondary | ICD-10-CM | POA: Diagnosis not present

## 2016-06-06 DIAGNOSIS — I4891 Unspecified atrial fibrillation: Secondary | ICD-10-CM | POA: Diagnosis not present

## 2016-06-06 DIAGNOSIS — Z8249 Family history of ischemic heart disease and other diseases of the circulatory system: Secondary | ICD-10-CM | POA: Diagnosis not present

## 2016-06-06 DIAGNOSIS — R748 Abnormal levels of other serum enzymes: Secondary | ICD-10-CM | POA: Diagnosis present

## 2016-06-06 DIAGNOSIS — K449 Diaphragmatic hernia without obstruction or gangrene: Secondary | ICD-10-CM | POA: Diagnosis not present

## 2016-06-06 DIAGNOSIS — I480 Paroxysmal atrial fibrillation: Secondary | ICD-10-CM

## 2016-06-06 DIAGNOSIS — Z85528 Personal history of other malignant neoplasm of kidney: Secondary | ICD-10-CM | POA: Diagnosis not present

## 2016-06-06 DIAGNOSIS — M109 Gout, unspecified: Secondary | ICD-10-CM | POA: Insufficient documentation

## 2016-06-06 DIAGNOSIS — J449 Chronic obstructive pulmonary disease, unspecified: Secondary | ICD-10-CM | POA: Diagnosis not present

## 2016-06-06 DIAGNOSIS — I4821 Permanent atrial fibrillation: Secondary | ICD-10-CM | POA: Diagnosis present

## 2016-06-06 DIAGNOSIS — K429 Umbilical hernia without obstruction or gangrene: Secondary | ICD-10-CM | POA: Diagnosis not present

## 2016-06-06 DIAGNOSIS — I5022 Chronic systolic (congestive) heart failure: Secondary | ICD-10-CM | POA: Insufficient documentation

## 2016-06-06 DIAGNOSIS — Z6832 Body mass index (BMI) 32.0-32.9, adult: Secondary | ICD-10-CM | POA: Insufficient documentation

## 2016-06-06 DIAGNOSIS — N189 Chronic kidney disease, unspecified: Secondary | ICD-10-CM | POA: Insufficient documentation

## 2016-06-06 DIAGNOSIS — Z96641 Presence of right artificial hip joint: Secondary | ICD-10-CM | POA: Diagnosis not present

## 2016-06-06 DIAGNOSIS — K219 Gastro-esophageal reflux disease without esophagitis: Secondary | ICD-10-CM | POA: Insufficient documentation

## 2016-06-06 DIAGNOSIS — I130 Hypertensive heart and chronic kidney disease with heart failure and stage 1 through stage 4 chronic kidney disease, or unspecified chronic kidney disease: Secondary | ICD-10-CM | POA: Diagnosis not present

## 2016-06-06 DIAGNOSIS — I159 Secondary hypertension, unspecified: Secondary | ICD-10-CM

## 2016-06-06 DIAGNOSIS — R0602 Shortness of breath: Secondary | ICD-10-CM | POA: Insufficient documentation

## 2016-06-06 DIAGNOSIS — Z9889 Other specified postprocedural states: Secondary | ICD-10-CM | POA: Insufficient documentation

## 2016-06-06 DIAGNOSIS — Z9049 Acquired absence of other specified parts of digestive tract: Secondary | ICD-10-CM | POA: Diagnosis not present

## 2016-06-06 DIAGNOSIS — Z7951 Long term (current) use of inhaled steroids: Secondary | ICD-10-CM | POA: Insufficient documentation

## 2016-06-06 DIAGNOSIS — Z87891 Personal history of nicotine dependence: Secondary | ICD-10-CM | POA: Diagnosis not present

## 2016-06-06 DIAGNOSIS — R55 Syncope and collapse: Secondary | ICD-10-CM | POA: Diagnosis present

## 2016-06-06 DIAGNOSIS — Z951 Presence of aortocoronary bypass graft: Secondary | ICD-10-CM

## 2016-06-06 DIAGNOSIS — I10 Essential (primary) hypertension: Secondary | ICD-10-CM | POA: Diagnosis present

## 2016-06-06 DIAGNOSIS — M25559 Pain in unspecified hip: Secondary | ICD-10-CM | POA: Diagnosis present

## 2016-06-06 DIAGNOSIS — F419 Anxiety disorder, unspecified: Secondary | ICD-10-CM | POA: Diagnosis not present

## 2016-06-06 DIAGNOSIS — M3131 Wegener's granulomatosis with renal involvement: Secondary | ICD-10-CM | POA: Insufficient documentation

## 2016-06-06 DIAGNOSIS — F410 Panic disorder [episodic paroxysmal anxiety] without agoraphobia: Secondary | ICD-10-CM | POA: Insufficient documentation

## 2016-06-06 DIAGNOSIS — I2510 Atherosclerotic heart disease of native coronary artery without angina pectoris: Secondary | ICD-10-CM | POA: Diagnosis not present

## 2016-06-06 HISTORY — DX: Unspecified atrial fibrillation: I48.91

## 2016-06-06 HISTORY — DX: Permanent atrial fibrillation: I48.21

## 2016-06-06 HISTORY — DX: Syncope and collapse: R55

## 2016-06-06 HISTORY — DX: Personal history of other malignant neoplasm of kidney: Z85.528

## 2016-06-06 HISTORY — DX: Chronic systolic (congestive) heart failure: I50.22

## 2016-06-06 HISTORY — DX: Panic disorder (episodic paroxysmal anxiety): F41.0

## 2016-06-06 HISTORY — DX: Nonrheumatic aortic (valve) stenosis: I35.0

## 2016-06-06 HISTORY — DX: Anxiety disorder, unspecified: F41.9

## 2016-06-06 LAB — BASIC METABOLIC PANEL
ANION GAP: 8 (ref 5–15)
BUN: 19 mg/dL (ref 6–20)
CALCIUM: 8.6 mg/dL — AB (ref 8.9–10.3)
CHLORIDE: 106 mmol/L (ref 101–111)
CO2: 25 mmol/L (ref 22–32)
Creatinine, Ser: 0.96 mg/dL (ref 0.61–1.24)
GFR calc non Af Amer: >60 mL/min (ref >60)
Glucose, Bld: 136 mg/dL — ABNORMAL HIGH (ref 65–99)
Potassium: 3.8 mmol/L (ref 3.5–5.1)
Sodium: 139 mmol/L (ref 135–145)

## 2016-06-06 LAB — CBC
HCT: 37.9 % — ABNORMAL LOW (ref 40.0–52.0)
HEMOGLOBIN: 13.5 g/dL (ref 13.0–18.0)
MCH: 33.3 pg (ref 26.0–34.0)
MCHC: 35.5 g/dL (ref 32.0–36.0)
MCV: 93.8 fL (ref 80.0–100.0)
PLATELETS: 208 10*3/uL (ref 150–440)
RBC: 4.05 MIL/uL — AB (ref 4.40–5.90)
RDW: 14.7 % — ABNORMAL HIGH (ref 11.5–14.5)
WBC: 6.6 10*3/uL (ref 3.8–10.6)

## 2016-06-06 LAB — TSH
TSH: 1.823 u[IU]/mL (ref 0.350–4.500)
TSH: 1.467 u[IU]/mL (ref 0.350–4.500)

## 2016-06-06 LAB — TROPONIN I
Troponin I: 0.04 ng/mL — ABNORMAL HIGH (ref <0.031)
Troponin I: <0.03 ng/mL (ref <0.031)

## 2016-06-06 LAB — BRAIN NATRIURETIC PEPTIDE: B Natriuretic Peptide: 98 pg/mL (ref 0.0–100.0)

## 2016-06-06 LAB — PROTIME-INR
INR: 1.06
PROTHROMBIN TIME: 14 s (ref 11.4–15.0)

## 2016-06-06 LAB — APTT: APTT: 30 s (ref 24–36)

## 2016-06-06 LAB — MAGNESIUM: MAGNESIUM: 1.9 mg/dL (ref 1.7–2.4)

## 2016-06-06 MED ORDER — ONDANSETRON HCL 4 MG/2ML IJ SOLN: 4.0000 mg | Freq: Four times a day (QID) | INTRAMUSCULAR | Status: DC | PRN

## 2016-06-06 MED ORDER — LOSARTAN POTASSIUM 25 MG PO TABS: 25.0000 mg | ORAL_TABLET | Freq: Every day | ORAL | Status: DC

## 2016-06-06 MED ORDER — ACETAMINOPHEN 325 MG PO TABS: 650.0000 mg | ORAL_TABLET | Freq: Four times a day (QID) | ORAL | Status: DC | PRN

## 2016-06-06 MED ORDER — TAMSULOSIN HCL 0.4 MG PO CAPS
0.4000 mg | ORAL_CAPSULE | Freq: Every day | ORAL | Status: DC
  Administered 2016-06-06: 0.4 mg via ORAL
  Filled 2016-06-06: qty 1

## 2016-06-06 MED ORDER — HEPARIN BOLUS VIA INFUSION
5000.0000 [IU] | Freq: Once | INTRAVENOUS | Status: AC
  Administered 2016-06-06: 5000 [IU] via INTRAVENOUS
  Filled 2016-06-06: qty 5000

## 2016-06-06 MED ORDER — HEPARIN BOLUS VIA INFUSION
4000.0000 [IU] | Freq: Once | INTRAVENOUS | Status: DC
  Filled 2016-06-06: qty 4000

## 2016-06-06 MED ORDER — PANTOPRAZOLE SODIUM 40 MG PO TBEC
40.0000 mg | DELAYED_RELEASE_TABLET | Freq: Every day | ORAL | Status: DC
  Administered 2016-06-06: 40 mg via ORAL
  Filled 2016-06-06: qty 1

## 2016-06-06 MED ORDER — LORAZEPAM 2 MG/ML IJ SOLN
INTRAMUSCULAR | Status: AC
  Administered 2016-06-06: 1 mg via INTRAVENOUS
  Filled 2016-06-06: qty 1

## 2016-06-06 MED ORDER — ASPIRIN EC 325 MG PO TBEC
325.0000 mg | DELAYED_RELEASE_TABLET | Freq: Once | ORAL | Status: AC
  Administered 2016-06-06: 325 mg via ORAL
  Filled 2016-06-06: qty 1

## 2016-06-06 MED ORDER — METOPROLOL SUCCINATE ER 25 MG PO TB24: 25.0000 mg | ORAL_TABLET | Freq: Every day | ORAL | Status: DC

## 2016-06-06 MED ORDER — APIXABAN 5 MG PO TABS: 5.0000 mg | ORAL_TABLET | Freq: Two times a day (BID) | ORAL | Status: DC

## 2016-06-06 MED ORDER — VITAMIN D 1000 UNITS PO TABS: 2000.0000 [IU] | ORAL_TABLET | Freq: Every day | ORAL | Status: DC

## 2016-06-06 MED ORDER — FLUTICASONE PROPIONATE 50 MCG/ACT NA SUSP
2.0000 | Freq: Every day | NASAL | Status: DC
  Filled 2016-06-06: qty 16

## 2016-06-06 MED ORDER — LORAZEPAM 2 MG/ML IJ SOLN
1.0000 mg | Freq: Once | INTRAMUSCULAR | Status: AC
Start: 2016-06-06 — End: 2016-06-06
  Administered 2016-06-06: 1 mg via INTRAVENOUS

## 2016-06-06 MED ORDER — SODIUM CHLORIDE 0.9% FLUSH
3.0000 mL | Freq: Two times a day (BID) | INTRAVENOUS | Status: DC
  Administered 2016-06-06: 3 mL via INTRAVENOUS

## 2016-06-06 MED ORDER — HEPARIN (PORCINE) IN NACL 100-0.45 UNIT/ML-% IJ SOLN
1250.0000 [IU]/h | INTRAMUSCULAR | Status: DC
  Administered 2016-06-06: 1250 [IU]/h via INTRAVENOUS
  Filled 2016-06-06: qty 250

## 2016-06-06 MED ORDER — ACETAMINOPHEN 650 MG RE SUPP: 650.0000 mg | Freq: Four times a day (QID) | RECTAL | Status: DC | PRN

## 2016-06-06 MED ORDER — ONDANSETRON HCL 4 MG PO TABS: 4.0000 mg | ORAL_TABLET | Freq: Four times a day (QID) | ORAL | Status: DC | PRN

## 2016-06-06 MED ORDER — APIXABAN 5 MG PO TABS
5.0000 mg | ORAL_TABLET | Freq: Two times a day (BID) | ORAL | Status: DC
  Administered 2016-06-06: 5 mg via ORAL
  Filled 2016-06-06: qty 1

## 2016-06-06 MED ORDER — ATORVASTATIN CALCIUM 20 MG PO TABS: 40.0000 mg | ORAL_TABLET | Freq: Every day | ORAL | Status: DC

## 2016-06-06 MED ORDER — NIACIN ER (ANTIHYPERLIPIDEMIC) 500 MG PO TBCR
500.0000 mg | EXTENDED_RELEASE_TABLET | Freq: Every day | ORAL | Status: DC
  Administered 2016-06-06: 500 mg via ORAL
  Filled 2016-06-06: qty 1

## 2016-06-06 NOTE — Care Management Obs Status (Signed)
[place] NOTIFICATION   Patient Details  Name: Jesus Hendrix MRN: [id number] Date of Birth: 08/21/1947   Medicare Observation Status Notification Given:  Yes    Beau Fanny, RN 06/06/2016, 11:09 AM

## 2016-06-06 NOTE — ED Notes (Signed)
Attempted report floor. RN unavailable.

## 2016-06-06 NOTE — ED Notes (Signed)
Pt denies PMH of atrial fibrillation.

## 2016-06-06 NOTE — ED Notes (Signed)
Pt presents to ED c/o of anxiety, restlessness, and SOB

## 2016-06-06 NOTE — ED Notes (Signed)
Attempted report. RN unavailable.

## 2016-06-06 NOTE — ED Notes (Signed)
Lab called to confirm that PT/INR tube is in the lab

## 2016-06-06 NOTE — ED Notes (Signed)
Handoff report given to [facility].

## 2016-06-06 NOTE — Care Management (Signed)
Patient for discharge home on Eliquis.  Confirms her has medication coverage through his medicare.  Provided 30 day free coupon.

## 2016-06-06 NOTE — Progress Notes (Signed)
Jesus Hendrix is a 70 y.o. male patient admitted from ED awake, alert - oriented  X 4 - no acute distress noted.  VSS - Blood pressure 156/88, pulse 106, temperature 97.6 F (36.4 C), temperature source Oral, resp. rate 22, height 5\' 8"  (1.727 m), weight 98.34 kg (216 lb 12.8 oz), SpO2 98 %.    IV in place, occlusive dsg intact without redness. Heparin started.   Orientation to room, and floor completed with information packet given to patient/family. Admission INP armband ID verified with patient/family, and in place.   SR up x 2, fall assessment complete, with patient and family able to verbalize understanding of risk associated with falls, and verbalized understanding.  Call light within reach, patient able to voice, and demonstrate understanding.  Skin, clean-dry- intact without evidence of bruising, or skin tears.   No evidence of skin break down noted on exam. Skin assessed with Cystal G. Tele box #20 verified with Edwena Blow, NT and [employer], Therapist, sports. Family at bedside.      Will cont to eval and treat per MD orders.  Horton Finer, RN 06/06/2016 12:42 PM

## 2016-06-06 NOTE — Progress Notes (Signed)
ANTICOAGULATION CONSULT NOTE - Initial Consult  Pharmacy Consult for Heparin Drip  Indication: atrial fibrillation  No Known Allergies  Patient Measurements: Height: 5\' 8"  (172.7 cm) Weight: 217 lb (98.431 kg) IBW/kg (Calculated) : 68.4 Heparin Dosing Weight: 89.37kg  Vital Signs: Temp: 97.6 F (36.4 C) (06/20 1153) Temp Source: Oral (06/20 1153) BP: 156/88 mmHg (06/20 1153) Pulse Rate: 106 (06/20 1153)  Labs:  Recent Labs  06/06/16 0822  HGB 13.5  HCT 37.9*  PLT 208  CREATININE 0.96  TROPONINI 0.04*    Estimated Creatinine Clearance: 81.4 mL/min (by C-G formula based on Cr of 0.96).   Medical History: Past Medical History  Diagnosis Date  . Hypertension   . Hyperlipidemia   . Coronary artery disease     a. status post CABG in 2007; b. nuc stress test 2014: small region of mild ischemia in the inferior territory, EF 61%, no ischemia, scan similar to prior scan in 2009  . Hiatal hernia   . Chronic kidney disease   . Choledocholithiasis   . COPD (chronic obstructive pulmonary disease) ([place])     Not on home oxygen  . History of colonic diverticulitis   . DM (diabetes mellitus) ([place])   . GERD (gastroesophageal reflux disease)   . Gout   . Spinal stenosis   . Nephrolithiasis   . Umbilical hernia   . Vitamin D deficiency   . Wegener's granulomatosis with renal involvement ([place])   . Temporomandibular joint pain dysfunction syndrome   . Exogenous obesity   . History of renal cell carcinoma     a. s/p laser ablation   . Chronic systolic CHF (congestive heart failure) ([place])     a. echo 04/2014: EF 45-50%, nl LV dia fxn, mild AS, LA mildly dilated, PASP nl  . Atrial fibrillation ([place]) 06/06/2016    a. newly diagnosed 06/06/2016; b. CHADS2VASc => 5 (CHF, HTN, age x 1, DM, vascular disease)  . Near syncope     a. 3 separate events in 2017  . Aortic stenosis     a. mild on echo 04/2014  . Anxiety   . Panic attacks     Medications:  Scheduled:  . atorvastatin   40 mg Oral q1800  . heparin  5,000 Units Intravenous Once   Infusions:  . heparin      Assessment: 70 yo male ordered heparin for atrial fibrillation.   Goal of Therapy:  Heparin level 0.3-0.7 units/ml Monitor platelets by anticoagulation protocol: Yes   Plan:  Give 5000 units bolus x 1 Start heparin infusion at 1250 units/hr Check anti-Xa level in 8 hours and daily while on heparin Continue to monitor H&H and platelets  Heparin bag sent to ED ~ 11:15, but patient transferred to 2A prior to infusion being started.  Called ED to send bag up to floor.    Olivia Canter, [facility] Clinical Pharmacist 06/06/2016,11:58 AM

## 2016-06-06 NOTE — H&P (Signed)
[facility] NAME: Jesus Hendrix    MR#:  [id number]  DATE OF BIRTH:  08/21/1947  DATE OF ADMISSION:  06/06/2016  PRIMARY CARE PHYSICIAN: Lenard Simmer, MD   REQUESTING/REFERRING PHYSICIAN: Dr. Marta Antu  CHIEF COMPLAINT:   Chief Complaint  Patient presents with  . Anxiety  . Shortness of Breath    HISTORY OF PRESENT ILLNESS:  Jesus Hendrix  is a 70 y.o. male with a known history of CAD status post CABG, CK D, hypertension, COPD not on home oxygen, chronic back pain with sciatica, diabetes mellitus, arthritis presents from home secondary to anxiety/panic attack this morning. Patient was noted to be in new onset atrial fibrillation. He was in sinus rhythm with history of left bundle branch block and some PVCs in the past from cardiology notes. His heart rate dropped to 30s today. Patient had an episode of near syncope last week. He occasionally feels palpitations at home. No chest pain, nausea vomiting or diaphoresis. He felt short of breath this morning which made him come to the emergency room. He received Ativan and his breathing is much better now. Slight elevation of troponin noted today. EKG showing atrial fibrillation which is now. Chest x-ray is negative. He is being admitted under observation for near syncope and new onset A. fib.  PAST MEDICAL HISTORY:   Past Medical History  Diagnosis Date  . Hypertension   . Hyperlipidemia   . Coronary artery disease     a. status post CABG in 2007; b. nuc stress test 2014: small region of mild ischemia in the inferior territory, EF 61%, no ischemia, scan similar to prior scan in 2009  . Hiatal hernia   . Chronic kidney disease   . Choledocholithiasis   . COPD (chronic obstructive pulmonary disease) ([place])     Not on home oxygen  . History of colonic diverticulitis   . DM (diabetes mellitus) ([place])   . GERD (gastroesophageal reflux disease)   . Gout   . Spinal stenosis    . Nephrolithiasis   . Umbilical hernia   . Vitamin D deficiency   . Wegener's granulomatosis with renal involvement ([place])   . Temporomandibular joint pain dysfunction syndrome   . Exogenous obesity   . History of renal cell carcinoma     a. s/p laser ablation   . Chronic systolic CHF (congestive heart failure) ([place])     a. echo 04/2014: EF 45-50%, nl LV dia fxn, mild AS, LA mildly dilated, PASP nl  . Atrial fibrillation ([place]) 06/06/2016    a. newly diagnosed 06/06/2016; b. CHADS2VASc => 5 (CHF, HTN, age x 1, DM, vascular disease)  . Near syncope     a. 3 separate events in 2017  . Aortic stenosis     a. mild on echo 04/2014  . Anxiety   . Panic attacks     PAST SURGICAL HISTORY:   Past Surgical History  Procedure Laterality Date  . Prostate surgery    . Coronary artery bypass graft  2004    CABG x 3  . Cardiac catheterization    . Hernia repair      x 2  . Eye surgery    . Intraocular lens insertion    . Hip surgery      right hip replacement  . Kidney surgery      cancer  . Cholecystectomy    . Colonoscopy N/A 06/03/2015    Procedure: COLONOSCOPY;  Surgeon: Lucilla Lame, MD;  Location: [facility];  Service: Gastroenterology;  Laterality: N/A;  . Polypectomy  06/03/2015    Procedure: POLYPECTOMY;  Surgeon: Lucilla Lame, MD;  Location: [facility];  Service: Gastroenterology;;    SOCIAL HISTORY:   Social History  Substance Use Topics  . Smoking status: Former Smoker -- 1.00 packs/day for 25 years    Types: Cigarettes    Quit date: 12/29/1974  . Smokeless tobacco: Not on file  . Alcohol Use: 1.2 oz/week    2 Cans of beer per week     Comment: social    FAMILY HISTORY:   Family History  Problem Relation Age of Onset  . Heart disease Mother   . Heart disease Sister   . Heart disease Brother     DRUG ALLERGIES:  No Known Allergies  REVIEW OF SYSTEMS:   Review of Systems  Constitutional: Negative for fever, chills, weight loss and  malaise/fatigue.  HENT: Negative for ear discharge, ear pain, hearing loss and nosebleeds.   Eyes: Negative for blurred vision, double vision and photophobia.  Respiratory: Positive for shortness of breath. Negative for cough, hemoptysis and wheezing.   Cardiovascular: Positive for palpitations. Negative for chest pain, orthopnea and leg swelling.  Gastrointestinal: Negative for heartburn, nausea, vomiting, abdominal pain, diarrhea, constipation and melena.  Genitourinary: Negative for dysuria, urgency, frequency and hematuria.  Musculoskeletal: Positive for myalgias and joint pain. Negative for back pain and neck pain.  Skin: Negative for rash.  Neurological: Negative for dizziness, tingling, sensory change, speech change, focal weakness and headaches.  Endo/Heme/Allergies: Does not bruise/bleed easily.  Psychiatric/Behavioral: Negative for depression.    MEDICATIONS AT HOME:   Prior to Admission medications   Medication Sig Start Date End Date Taking? Authorizing Provider  acetaminophen (TYLENOL) 325 MG tablet Take 650 mg by mouth 3 (three) times daily as needed for moderate pain.   Yes Historical Provider, MD  cholecalciferol (VITAMIN D) 1000 UNITS tablet Take 2,000 Units by mouth at bedtime.    Yes Historical Provider, MD  fluticasone (FLONASE) 50 MCG/ACT nasal spray Place 2 sprays into both nostrils at bedtime.    Yes Historical Provider, MD  furosemide (LASIX) 20 MG tablet Take 20 mg by mouth daily. 10/27/15  Yes Historical Provider, MD  losartan (COZAAR) 50 MG tablet Take 25 mg by mouth at bedtime.    Yes Historical Provider, MD  niacin (NIASPAN) 500 MG CR tablet Take 500 mg by mouth daily.   Yes Historical Provider, MD  pantoprazole (PROTONIX) 40 MG tablet Take 40 mg by mouth daily.   Yes Historical Provider, MD  tamsulosin (FLOMAX) 0.4 MG CAPS capsule Take 0.4 mg by mouth daily. 01/28/15  Yes Historical Provider, MD  atorvastatin (LIPITOR) 40 MG tablet Take 1 tablet (40 mg total) by  mouth daily. Patient not taking: Reported on 06/06/2016 11/15/15   Minna Merritts, MD  ezetimibe (ZETIA) 10 MG tablet Take 1 tablet (10 mg total) by mouth daily. Patient not taking: Reported on 06/06/2016 11/15/15   Minna Merritts, MD      VITAL SIGNS:  Blood pressure 140/92, temperature 97.7 F (36.5 C), temperature source Oral, resp. rate 16, height 5\' 8"  (1.727 m), weight 98.431 kg (217 lb), SpO2 95 %.  PHYSICAL EXAMINATION:   Physical Exam  GENERAL:  70 y.o.-year-old patient lying in the bed with no acute distress.  EYES: Pupils equal, round, reactive to light and accommodation. No scleral icterus. Extraocular muscles intact.  HEENT: Head atraumatic,  normocephalic. Oropharynx and nasopharynx clear.  NECK:  Supple, no jugular venous distention. No thyroid enlargement, no tenderness.  LUNGS: Normal breath sounds bilaterally, no wheezing, rales,rhonchi or crepitation. No use of accessory muscles of respiration.  CARDIOVASCULAR: S1, S2 normal. No murmurs, rubs, or gallops.  ABDOMEN: Soft, nontender, nondistended. Bowel sounds present. No organomegaly or mass.  EXTREMITIES: No pedal edema, cyanosis, or clubbing. Left hip pain secondary to arthritis. NEUROLOGIC: Cranial nerves II through XII are intact. Muscle strength 5/5 in all extremities. Sensation intact. Gait not checked.  PSYCHIATRIC: The patient is alert and oriented x 3.  SKIN: No obvious rash, lesion, or ulcer.   LABORATORY PANEL:   CBC  Recent Labs Lab 06/06/16 0822  WBC 6.6  HGB 13.5  HCT 37.9*  PLT 208   ------------------------------------------------------------------------------------------------------------------  Chemistries   Recent Labs Lab 06/06/16 0822  NA 139  K 3.8  CL 106  CO2 25  GLUCOSE 136*  BUN 19  CREATININE 0.96  CALCIUM 8.6*  MG 1.9   ------------------------------------------------------------------------------------------------------------------  Cardiac Enzymes  Recent  Labs Lab 06/06/16 0822  TROPONINI 0.04*   ------------------------------------------------------------------------------------------------------------------  RADIOLOGY:  Dg Chest 2 View  06/06/2016  CLINICAL DATA:  Shortness of breath. EXAM: CHEST  2 VIEW COMPARISON:  07/14/2011 FINDINGS: Postsurgical changes from CABG is stable. Cardiomediastinal silhouette is normal. Mediastinal contours appear intact. There is no evidence of focal airspace consolidation, pleural effusion or pneumothorax. Osseous structures are without acute abnormality. Soft tissues are grossly normal. IMPRESSION: No active cardiopulmonary disease. Electronically Signed   By: Fidela Salisbury M.D.   On: 06/06/2016 08:52   Ct Head Wo Contrast  06/06/2016  CLINICAL DATA:  Near syncope and shortness of breath, possible anxiety attack. EXAM: CT HEAD WITHOUT CONTRAST TECHNIQUE: Contiguous axial images were obtained from the base of the skull through the vertex without intravenous contrast. COMPARISON:  Report from 01/08/1999 FINDINGS: Remote lacunar infarcts in the right external capsule and right periventricular white matter. Periventricular white matter and corona radiata hypodensities favor chronic ischemic microvascular white matter disease. Otherwise, the brainstem, cerebellum, cerebral peduncles, thalami, basal ganglia, basilar cisterns, and ventricular system appear within normal limits. No intracranial hemorrhage, mass lesion, or acute CVA. There is atherosclerotic calcification of the cavernous carotid arteries bilaterally. Mucosal thickening in the nasal cavity with 7 mm of leftward nasal septal deviation and right-sided concha bullosa. IMPRESSION: 1. No acute intracranial findings. 2. Remote right-sided lacunar infarcts in the periventricular white matter and external capsule. Periventricular white matter and corona radiata hypodensities favor chronic ischemic microvascular white matter disease. 3. Mucosal thickening in the  nasal cavity with leftward nasal septal deviation. Electronically Signed   By: Van Clines M.D.   On: 06/06/2016 10:31    EKG:   Orders placed or performed during the hospital encounter of 06/06/16  . ED EKG within 10 minutes  . ED EKG within 10 minutes    IMPRESSION AND PLAN:   Jesus Hendrix  is a 70 y.o. male with a known history of CAD status post CABG, CK D, hypertension, COPD not on home oxygen, chronic back pain with sciatica, diabetes mellitus, arthritis presents from home secondary to anxiety/panic attack this morning.  #1 New onset atrial fibrillation- has been rate controlled since being here. -Per ER physician, bradycardic with heart rate into the 40s. None now. -Not on any rate controlling medications. -Cardiology consulted. Started on heparin drip -Echocardiogram ordered. -Possible Lexiscan tomorrow.  #2 presyncopal episode/dizziness-no loss of consciousness. -CT of the head is pending. Not  sure if cardiac arrhythmia is contributing to these events. Might need outpatient monitoring -Echo and Dopplers ordered.  #3 CAD status post CABG-for Myoview tomorrow -Check echocardiogram. Appreciate cardiology consult. Currently on heparin drip. -Trend troponins  #4 hypertension-continue home medications  #5 hyperlipidemia-on statin  #6 GERD-Protonix  #7 DVT prophylaxis-on heparin drip    All the records are reviewed and case discussed with ED provider. Management plans discussed with the patient, family and they are in agreement.  CODE STATUS: Full Code  TOTAL TIME TAKING CARE OF THIS PATIENT: 50 minutes.    Jesus Hendrix M.D on 06/06/2016 at 11:20 AM  Between 7am to 6pm - Pager - [phone]  After 6pm go to [email or web address] - password [email or web address]  Office  [phone]  CC: Primary care physician; Lenard Simmer, MD

## 2016-06-06 NOTE — Telephone Encounter (Signed)
Lm with wife to schedule a Carotid U/s (2 yr f/u). She stated that he wouldn't schedule right now as he is having other health issues.

## 2016-06-06 NOTE — Consult Note (Signed)
Cardiology Consultation Note  [name], MRN: [id number], DOB/AGE: 72/03/1947 72 y.o. Admit date: 06/06/2016   Date of Consult: 06/06/2016 Primary Physician: Lenard Simmer, MD Primary Cardiologist: Dr. Rockey Situ, MD Requesting Physician: Dr. Tressia Miners, MD  Chief Complaint: SOB and anxiety Reason for Consult: new onset Afib  HPI: 70 y.o. male with h/o CAD s/p CABG in 2007, Wegener's granulomatosis, renal carcinoma s/p laser ablation, HTN, HLD, obesity, COPD, diverticulitis, chronic back pain with associated sciatica, anxiety, and recent presyncope/syncopal events who presented to [facility] on 6/20 with anxiety/panic attack and newly found Afib.   Last cardiac in 2007 showed 75-80% long stenosis in the LAD at the ostium, 95% PDA stenosis. He was referred for cardiac bypass surgery. Echo in 2009 showed normal LVEF, mild MR, and aortic valve sclerosis without stenosis. Stress test in 2009 showed a large region of decreased perfusion of mild to moderate severity in the inferior wall consistent with possible old MI. Defect was worse in the inferoapical region. No ischemia. EF 55%. He exercised for 7 METS. Repeat stress test in 2014 showed small region of mild ischemia in the inferior territory, EF 61%, no EKG changes concerning for ischemia, Moderate risk scan. Scan was similar to his prior test in 2009. Echo in May 2015 showed EF 45-50%, mild AS, LA mildly dilated, PASP normal. 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 He was not having any chest pain or tachy-palpitations. He was noted to be having renal surgery at that time and was cleared. He recently contacted our office on 05/26/2016 stating he needed cardiac clearance for orthopedic surgery. He was advised to follow up and had an appointment scheduled on 06/08/16.   Of note, the patient has suffered 3 separate dizzy/presyncope episodes over the past 12 months with the  last 2 occuring within the past 1 week. He reports these events last approximately 5 seconds and self resolve. Most recent carotid doppler from 2015 showed 1-39% bilateral ICA stenosis. Never with associated chest pain, palpitations, SOB, nausea, vomiting, or diaphoresis. Has never fully suffered LOC.    He presented to [facility] ED on 6/20 with complaints of panic attack for 2 hours and SOB. He was sitting in his house when he suddenly became SOB and anxious. He had to walk outdoors to attempt to catch his breath. His panic attacks usually last 5-10 minutes. He could not feel any palpitations. His main complaint was he could not catch his breath. No chest pain, nausea, vomiting, diaphoresis, presyncope or syncope with this episode.   Upon the patient's arrival to [facility] they were found to have an initial troponin of 0.04, BNP 98.0, hgb 13.5, WBC 6.6, PLT 208, SCr 0.96, BUN 19, K+ 3.8. ECG showed Afib, 89 bpm, LBBB (known), CXR showed no active cardiopulmonary disease. Head CT pending. He has received full-dose aspirin and Ativan in the ED. Cardiology added Mg++, TSH, and echo. Currently, in rate controlled Afib. Review of tele as below with no evidence of severely bradycardic heart rates in the 30's to 40's. His heart rate has remained in the 60's bpm.    Past Medical History  Diagnosis Date  . Hypertension   . Hyperlipidemia   . Coronary artery disease     a. status post CABG in 2007; b. nuc stress test 2014: small region of mild ischemia in the inferior territory, EF 61%, no ischemia, scan similar to prior scan in 2009  .  Hiatal hernia   . Chronic kidney disease   . Choledocholithiasis   . COPD (chronic obstructive pulmonary disease) ([place])     Not on home oxygen  . History of colonic diverticulitis   . DM (diabetes mellitus) ([place])   . GERD (gastroesophageal reflux disease)   . Gout   . Spinal stenosis   . Nephrolithiasis   . Umbilical hernia   . Vitamin D deficiency   . Wegener's granulomatosis  with renal involvement ([place])   . Temporomandibular joint pain dysfunction syndrome   . Exogenous obesity   . History of renal cell carcinoma     a. s/p laser ablation   . Chronic systolic CHF (congestive heart failure) ([place])     a. echo 04/2014: EF 45-50%, nl LV dia fxn, mild AS, LA mildly dilated, PASP nl  . Atrial fibrillation ([place]) 06/06/2016    a. newly diagnosed 06/06/2016; b. CHADS2VASc => 5 (CHF, HTN, age x 1, DM, vascular disease)  . Near syncope     a. 3 separate events in 2017  . Aortic stenosis     a. mild on echo 04/2014  . Anxiety   . Panic attacks       Most Recent Cardiac Studies: Echo 04/2014: Study Conclusions - Left ventricle: The cavity size was normal. Systolic function was mildly reduced. The estimated ejection fraction was in the range of 45% to 50%. Septal motion consistent with conduction abnormality and postoperative state. Left ventricular diastolic function parameters were normal. - Aortic valve: Poorly visualized. Transvalvular velocity was increased. There was mild stenosis. Peak gradient (S): 20 mm Hg. - Mitral valve: Calcified annulus. There was mild regurgitation. - Left atrium: The atrium was mildly dilated. - Pulmonary arteries: Systolic pressure was within the normal range.  Nuclear stress test 10/2013: Small region of mild ischemia in the inferior territory, EF 61%, no EKG changes concerning for ischemia, Moderate risk scan. Scan was similar to his prior test in 2009.   Surgical History:  Past Surgical History  Procedure Laterality Date  . Prostate surgery    . Coronary artery bypass graft  2004    CABG x 3  . Cardiac catheterization    . Hernia repair      x 2  . Eye surgery    . Intraocular lens insertion    . Hip surgery      right hip replacement  . Kidney surgery      cancer  . Cholecystectomy    . Colonoscopy N/A 06/03/2015    Procedure: COLONOSCOPY;  Surgeon: Lucilla Lame, MD;  Location: [facility];  Service:  Gastroenterology;  Laterality: N/A;  . Polypectomy  06/03/2015    Procedure: POLYPECTOMY;  Surgeon: Lucilla Lame, MD;  Location: [facility];  Service: Gastroenterology;;     Home Meds: Prior to Admission medications   Medication Sig Start Date End Date Taking? Authorizing Provider  acetaminophen (TYLENOL) 325 MG tablet Take 650 mg by mouth 3 (three) times daily as needed for moderate pain.   Yes Historical Provider, MD  cholecalciferol (VITAMIN D) 1000 UNITS tablet Take 2,000 Units by mouth at bedtime.    Yes Historical Provider, MD  fluticasone (FLONASE) 50 MCG/ACT nasal spray Place 2 sprays into both nostrils at bedtime.    Yes Historical Provider, MD  furosemide (LASIX) 20 MG tablet Take 20 mg by mouth daily. 10/27/15  Yes Historical Provider, MD  losartan (COZAAR) 50 MG tablet Take 25 mg by mouth at bedtime.  Yes Historical Provider, MD  niacin (NIASPAN) 500 MG CR tablet Take 500 mg by mouth daily.   Yes Historical Provider, MD  pantoprazole (PROTONIX) 40 MG tablet Take 40 mg by mouth daily.   Yes Historical Provider, MD  tamsulosin (FLOMAX) 0.4 MG CAPS capsule Take 0.4 mg by mouth daily. 01/28/15  Yes Historical Provider, MD  atorvastatin (LIPITOR) 40 MG tablet Take 1 tablet (40 mg total) by mouth daily. Patient not taking: Reported on 06/06/2016 11/15/15   Minna Merritts, MD  ezetimibe (ZETIA) 10 MG tablet Take 1 tablet (10 mg total) by mouth daily. Patient not taking: Reported on 06/06/2016 11/15/15   Minna Merritts, MD    Inpatient Medications:     Allergies: No Known Allergies  Social History   Social History  . Marital Status: Married    Spouse Name: N/A  . Number of Children: N/A  . Years of Education: N/A   Occupational History  . Not on file.   Social History Main Topics  . Smoking status: Former Smoker -- 1.00 packs/day for 25 years    Types: Cigarettes    Quit date: 12/29/1974  . Smokeless tobacco: Not on file  . Alcohol Use: 1.2 oz/week    2 Cans  of beer per week     Comment: social  . Drug Use: No  . Sexual Activity: Not on file   Other Topics Concern  . Not on file   Social History Narrative     Family History  Problem Relation Age of Onset  . Heart disease Mother   . Heart disease Sister   . Heart disease Brother      Review of Systems: Review of Systems  Constitutional: Positive for malaise/fatigue. Negative for fever, chills, weight loss and diaphoresis.  HENT: Negative for congestion.   Eyes: Negative for discharge and redness.  Respiratory: Positive for shortness of breath. Negative for cough, hemoptysis, sputum production and wheezing.   Cardiovascular: Negative for chest pain, palpitations, orthopnea, claudication, leg swelling and PND.  Gastrointestinal: Negative for heartburn, nausea, vomiting, abdominal pain, blood in stool and melena.  Genitourinary: Negative for hematuria.  Musculoskeletal: Positive for back pain and joint pain. Negative for myalgias, falls and neck pain.       Left hip pain  Skin: Negative for rash.  Neurological: Negative for dizziness, sensory change, speech change, focal weakness, weakness and headaches.  Endo/Heme/Allergies: Does not bruise/bleed easily.  Psychiatric/Behavioral: Negative for substance abuse. The patient is nervous/anxious.   All other systems reviewed and are negative.   Labs:  Recent Labs  06/06/16 0822  TROPONINI 0.04*   Lab Results  Component Value Date   WBC 6.6 06/06/2016   HGB 13.5 06/06/2016   HCT 37.9* 06/06/2016   MCV 93.8 06/06/2016   PLT 208 06/06/2016     Recent Labs Lab 06/06/16 0822  NA 139  K 3.8  CL 106  CO2 25  BUN 19  CREATININE 0.96  CALCIUM 8.6*  GLUCOSE 136*   No results found for: CHOL, HDL, LDLCALC, TRIG No results found for: DDIMER  Radiology/Studies:  Dg Chest 2 View  06/06/2016  CLINICAL DATA:  Shortness of breath. EXAM: CHEST  2 VIEW COMPARISON:  07/14/2011 FINDINGS: Postsurgical changes from CABG is stable.  Cardiomediastinal silhouette is normal. Mediastinal contours appear intact. There is no evidence of focal airspace consolidation, pleural effusion or pneumothorax. Osseous structures are without acute abnormality. Soft tissues are grossly normal. IMPRESSION: No active cardiopulmonary disease. Electronically Signed  By: Fidela Salisbury M.D.   On: 06/06/2016 08:52   Ct Head Wo Contrast  06/06/2016  CLINICAL DATA:  Near syncope and shortness of breath, possible anxiety attack. EXAM: CT HEAD WITHOUT CONTRAST TECHNIQUE: Contiguous axial images were obtained from the base of the skull through the vertex without intravenous contrast. COMPARISON:  Report from 01/08/1999 FINDINGS: Remote lacunar infarcts in the right external capsule and right periventricular white matter. Periventricular white matter and corona radiata hypodensities favor chronic ischemic microvascular white matter disease. Otherwise, the brainstem, cerebellum, cerebral peduncles, thalami, basal ganglia, basilar cisterns, and ventricular system appear within normal limits. No intracranial hemorrhage, mass lesion, or acute CVA. There is atherosclerotic calcification of the cavernous carotid arteries bilaterally. Mucosal thickening in the nasal cavity with 7 mm of leftward nasal septal deviation and right-sided concha bullosa. IMPRESSION: 1. No acute intracranial findings. 2. Remote right-sided lacunar infarcts in the periventricular white matter and external capsule. Periventricular white matter and corona radiata hypodensities favor chronic ischemic microvascular white matter disease. 3. Mucosal thickening in the nasal cavity with leftward nasal septal deviation. Electronically Signed   By: Van Clines M.D.   On: 06/06/2016 10:31    EKG: Interpreted by me showed: Afib, 89 bpm, LBBB (known) Telemetry: Interpreted by me showed: Afib with peak HR in the 80's bpm. I do not see any heart rates in the 30's bpm and the reported heart rates in  the 40's are artifact given this is an irregularly-irregular rhythm  Weights: Filed Weights   06/06/16 0814  Weight: 217 lb (98.431 kg)     Physical Exam: Blood pressure 140/92, temperature 97.7 F (36.5 C), temperature source Oral, resp. rate 16, height 5\' 8"  (1.727 m), weight 217 lb (98.431 kg), SpO2 95 %. Body mass index is 33 kg/(m^2). General: Well developed, well nourished, in no acute distress. Head: Normocephalic, atraumatic, sclera non-icteric, no xanthomas, nares are without discharge.  Neck: Negative for carotid bruits. JVD not elevated. Lungs: Clear bilaterally to auscultation without wheezes, rales, or rhonchi. Breathing is unlabored. Heart: RRR with S1 S2. No murmurs, rubs, or gallops appreciated. Abdomen: Obese, soft, non-tender, non-distended with normoactive bowel sounds. No hepatomegaly. No rebound/guarding. No obvious abdominal masses. Msk:  Strength and tone appear normal for age. Extremities: No clubbing or cyanosis. No edema. Distal pedal pulses are 2+ and equal bilaterally. Neuro: Alert and oriented X 3. No facial asymmetry. No focal deficit. Moves all extremities spontaneously. Psych:  Responds to questions appropriately with a normal affect.    Assessment and Plan:  Principal Problem:   New onset a-fib [facility]) Active Problems:   CAD (coronary artery disease)   S/P CABG x 3   HTN (hypertension)   Near syncope   Anxiety   Hip pain   Hyperlipidemia    1. New onset Afib: -Currently rate controlled in the 60's bpm with a peak heart rate in the 80's bpm -No evidence of severely bradycardic heart rates -Would rate control as it is uncertain when he exactly went into this rhythm, or if he has been in it previously, given his presyncope (possible pst-termination pause) without full-dose anticoagulation  -Check echo to evaluate LVSF -Add Mg++ and TSH -CHADS2VASc at least 5 (CHF, HTN, age x 1, DM, vascular disease) -For now, until we see how the troponin  trends would anticoagulate with heparin gtt. If no inpatient ischemic workup is needed would transition to [place] given the above stroke risk -Check Lexiscan as below -Hip pain possibly precipitating event. Could also consider  obesity with possible sleep apnea  2. CAD s/p CABG: -Not on aspirin upon admission -No symptoms concerning for angina -Will check Lexiscan Myoview (cannot treadmill 2/2 hip pain) on 6/21 given his dizziness/presyncope events as well as for pre-operative evaluation for upcoming hip replacement  -Not on beta blocker at this time given heart rates in the low 60's to 50's bpm  3. Dizziness/presyncope: -has had 3 events within the past 12 months and 2 of these events have occurred in the past 1 week -Never with LOC -Cannot rule out post-termination pause from possible Afib occuring at home leading to these events -Check echo as above as well as repeat carotid doppler ultrasound  -Events have not been with positional changes, thus orthostasis is less likely   4. Chronic systolic CHF: -Most recent EF 45-50% in 04/2014 -Check echo as above -Will eventually be placed on DOAC in place of aspirin. No indication for dual therapy at this time  5. Elevated troponin: -Initial troponin 0.04, continue to cycle -Check echo as above -Heparin gtt as above -Check Lexiscan Myoview as above, unless troponin trends upwards then would need cardiac cath  6. Anxiety/panic attack: -Improved s/p Ativan -Per IM  7. HTN: -Improved -Presented with SBP in the 180's, now in the 1-teens -Continue losartan -If needed for rate control would add metoprolol   8. HLD: -Continue Lipitor and Zetia  9. Hip pain: -Planning to have hip replaced -Lexiscan as above for pre-operative evaluation    Signed, Marcille Blanco [place] Pager: [phone] 06/06/2016, 11:19 AM

## 2016-06-06 NOTE — Discharge Summary (Signed)
[facility] [place] NAME: Jesus Hendrix    MR#:  [id number]  DATE OF BIRTH:  08/21/1947  DATE OF ADMISSION:  06/06/2016 ADMITTING PHYSICIAN: Gladstone Lighter, MD  DATE OF DISCHARGE: 06/06/2016  4:24 PM  PRIMARY CARE PHYSICIAN: Lenard Simmer, MD    ADMISSION DIAGNOSIS:  Shortness of breath [R06.02] Cardiac enzymes elevated [R74.8] Near syncope [R55] Atrial fibrillation, unspecified type ([place]) [I48.91]  DISCHARGE DIAGNOSIS:  Principal Problem:   New onset a-fib ([place]) Active Problems:   CAD (coronary artery disease)   S/P CABG x 3   Hyperlipidemia   HTN (hypertension)   Near syncope   Anxiety   Hip pain   Shortness of breath   SECONDARY DIAGNOSIS:   Past Medical History  Diagnosis Date  . Hypertension   . Hyperlipidemia   . Coronary artery disease     a. status post CABG in 2007; b. nuc stress test 2014: small region of mild ischemia in the inferior territory, EF 61%, no ischemia, scan similar to prior scan in 2009  . Hiatal hernia   . Chronic kidney disease   . Choledocholithiasis   . COPD (chronic obstructive pulmonary disease) ([place])     Not on home oxygen  . History of colonic diverticulitis   . DM (diabetes mellitus) ([place])   . GERD (gastroesophageal reflux disease)   . Gout   . Spinal stenosis   . Nephrolithiasis   . Umbilical hernia   . Vitamin D deficiency   . Wegener's granulomatosis with renal involvement ([place])   . Temporomandibular joint pain dysfunction syndrome   . Exogenous obesity   . History of renal cell carcinoma     a. s/p laser ablation   . Chronic systolic CHF (congestive heart failure) ([place])     a. echo 04/2014: EF 45-50%, nl LV dia fxn, mild AS, LA mildly dilated, PASP nl  . Atrial fibrillation ([place]) 06/06/2016    a. newly diagnosed 06/06/2016; b. CHADS2VASc => 5 (CHF, HTN, age x 1, DM, vascular disease)  . Near syncope     a. 3 separate events in 2017  . Aortic stenosis     a. mild  on echo 04/2014  . Anxiety   . Panic attacks     HOSPITAL COURSE:   Jesus Hendrix is a 70 y.o. male with a known history of CAD status post CABG, CK D, hypertension, COPD not on home oxygen, chronic back pain with sciatica, diabetes mellitus, arthritis presents from home secondary to anxiety/panic attack this morning.  #1 New onset atrial fibrillation- rate controlled in the hospital. -Started on Toprol 25 mg daily and also eliquis 5 mg twice a day for anticoagulation. Ex line-outpatient follow-up for echocardiogram and stress test if needed. -Patient is asymptomatic, seen by cardiology. -For a near syncopal episode as outpatient concerning his atrial fibrillation, 30 day event monitor will be set up as outpatient in the next 1-2 days. Advised to refrain from driving until then.  #2 dyspnea on admission-secondary to anxiety. Improved with Ativan. -Cardiology follow up as outpatient for possible stress test  #3 CAD status post CABG-outpatient cardiology follow-up. Continue cardiac medications. -Patient is asymptomatic.  #4 hypertension-continue home medications and Toprol added  #5 hyperlipidemia-on statin  #6 GERD-Protonix  Patient is being discharged today  DISCHARGE CONDITIONS:   Stable  CONSULTS OBTAINED:  Treatment Team:  Minna Merritts, MD  DRUG ALLERGIES:  No Known Allergies  DISCHARGE MEDICATIONS:   Discharge Medication List  as of 06/06/2016  4:02 PM    START taking these medications   Details  apixaban (ELIQUIS) 5 MG TABS tablet Take 1 tablet (5 mg total) by mouth 2 (two) times daily., Starting 06/06/2016, Until Discontinued, Print    metoprolol succinate (TOPROL-XL) 25 MG 24 hr tablet Take 1 tablet (25 mg total) by mouth daily., Starting 06/07/2016, Until Discontinued, Print      CONTINUE these medications which have NOT CHANGED   Details  acetaminophen (TYLENOL) 325 MG tablet Take 650 mg by mouth 3 (three) times daily as needed for moderate pain., Until  Discontinued, Historical Med    cholecalciferol (VITAMIN D) 1000 UNITS tablet Take 2,000 Units by mouth at bedtime. , Until Discontinued, Historical Med    fluticasone (FLONASE) 50 MCG/ACT nasal spray Place 2 sprays into both nostrils at bedtime. , Until Discontinued, Historical Med    furosemide (LASIX) 20 MG tablet Take 20 mg by mouth daily., Starting 10/27/2015, Until Discontinued, Historical Med    losartan (COZAAR) 50 MG tablet Take 25 mg by mouth at bedtime. , Until Discontinued, Historical Med    niacin (NIASPAN) 500 MG CR tablet Take 500 mg by mouth daily., Until Discontinued, Historical Med    pantoprazole (PROTONIX) 40 MG tablet Take 40 mg by mouth daily., Until Discontinued, Historical Med    tamsulosin (FLOMAX) 0.4 MG CAPS capsule Take 0.4 mg by mouth daily., Starting 01/28/2015, Until Discontinued, Historical Med    atorvastatin (LIPITOR) 40 MG tablet Take 1 tablet (40 mg total) by mouth daily., Starting 11/15/2015, Until Discontinued, Normal    ezetimibe (ZETIA) 10 MG tablet Take 1 tablet (10 mg total) by mouth daily., Starting 11/15/2015, Until Discontinued, Normal         DISCHARGE INSTRUCTIONS:   1. Cardiology follow up in 2 days as prior schedule 2. PCP follow-up in 1-2 weeks  If you experience worsening of your admission symptoms, develop shortness of breath, life threatening emergency, suicidal or homicidal thoughts you must seek medical attention immediately by calling 911 or calling your MD immediately  if symptoms less severe.  You Must read complete instructions/literature along with all the possible adverse reactions/side effects for all the Medicines you take and that have been prescribed to you. Take any new Medicines after you have completely understood and accept all the possible adverse reactions/side effects.   Please note  You were cared for by a hospitalist during your hospital stay. If you have any questions about your discharge medications or the  care you received while you were in the hospital after you are discharged, you can call the unit and asked to speak with the hospitalist on call if the hospitalist that took care of you is not available. Once you are discharged, your primary care physician will handle any further medical issues. Please note that NO REFILLS for any discharge medications will be authorized once you are discharged, as it is imperative that you return to your primary care physician (or establish a relationship with a primary care physician if you do not have one) for your aftercare needs so that they can reassess your need for medications and monitor your lab values.    Today   CHIEF COMPLAINT:   Chief Complaint  Patient presents with  . Anxiety  . Shortness of Breath    VITAL SIGNS:  Blood pressure 156/88, pulse 106, temperature 97.6 F (36.4 C), temperature source Oral, resp. rate 22, height 5\' 8"  (1.727 m), weight 98.34 kg (216 lb 12.8 oz),  SpO2 98 %.  I/O:  No intake or output data in the 24 hours ending 06/06/16 1704  PHYSICAL EXAMINATION:   Physical Exam  GENERAL: 70 y.o.-year-old patient lying in the bed with no acute distress.  EYES: Pupils equal, round, reactive to light and accommodation. No scleral icterus. Extraocular muscles intact.  HEENT: Head atraumatic, normocephalic. Oropharynx and nasopharynx clear.  NECK: Supple, no jugular venous distention. No thyroid enlargement, no tenderness.  LUNGS: Normal breath sounds bilaterally, no wheezing, rales,rhonchi or crepitation. No use of accessory muscles of respiration.  CARDIOVASCULAR: S1, S2 normal. No murmurs, rubs, or gallops.  ABDOMEN: Soft, nontender, nondistended. Bowel sounds present. No organomegaly or mass.  EXTREMITIES: No pedal edema, cyanosis, or clubbing. Left hip pain secondary to arthritis. NEUROLOGIC: Cranial nerves II through XII are intact. Muscle strength 5/5 in all extremities. Sensation intact. Gait not checked.   PSYCHIATRIC: The patient is alert and oriented x 3.  SKIN: No obvious rash, lesion, or ulcer.   DATA REVIEW:   CBC  Recent Labs Lab 06/06/16 0822  WBC 6.6  HGB 13.5  HCT 37.9*  PLT 208    Chemistries   Recent Labs Lab 06/06/16 0822  NA 139  K 3.8  CL 106  CO2 25  GLUCOSE 136*  BUN 19  CREATININE 0.96  CALCIUM 8.6*  MG 1.9    Cardiac Enzymes  Recent Labs Lab 06/06/16 [place] <0.03    Microbiology Results  No results found for this or any previous visit.  RADIOLOGY:  Dg Chest 2 View  06/06/2016  CLINICAL DATA:  Shortness of breath. EXAM: CHEST  2 VIEW COMPARISON:  07/14/2011 FINDINGS: Postsurgical changes from CABG is stable. Cardiomediastinal silhouette is normal. Mediastinal contours appear intact. There is no evidence of focal airspace consolidation, pleural effusion or pneumothorax. Osseous structures are without acute abnormality. Soft tissues are grossly normal. IMPRESSION: No active cardiopulmonary disease. Electronically Signed   By: Fidela Salisbury M.D.   On: 06/06/2016 08:52   Ct Head Wo Contrast  06/06/2016  CLINICAL DATA:  Near syncope and shortness of breath, possible anxiety attack. EXAM: CT HEAD WITHOUT CONTRAST TECHNIQUE: Contiguous axial images were obtained from the base of the skull through the vertex without intravenous contrast. COMPARISON:  Report from 01/08/1999 FINDINGS: Remote lacunar infarcts in the right external capsule and right periventricular white matter. Periventricular white matter and corona radiata hypodensities favor chronic ischemic microvascular white matter disease. Otherwise, the brainstem, cerebellum, cerebral peduncles, thalami, basal ganglia, basilar cisterns, and ventricular system appear within normal limits. No intracranial hemorrhage, mass lesion, or acute CVA. There is atherosclerotic calcification of the cavernous carotid arteries bilaterally. Mucosal thickening in the nasal cavity with 7 mm of leftward  nasal septal deviation and right-sided concha bullosa. IMPRESSION: 1. No acute intracranial findings. 2. Remote right-sided lacunar infarcts in the periventricular white matter and external capsule. Periventricular white matter and corona radiata hypodensities favor chronic ischemic microvascular white matter disease. 3. Mucosal thickening in the nasal cavity with leftward nasal septal deviation. Electronically Signed   By: Van Clines M.D.   On: 06/06/2016 10:31    EKG:   Orders placed or performed in visit on 06/06/16  . EKG 12-Lead  . EKG 12-Lead  . EKG 12-Lead      Management plans discussed with the patient, family and they are in agreement.  CODE STATUS:     Code Status Orders        Start     Ordered   06/06/16  1202  Full code   Continuous     06/06/16 1201    Code Status History    Date Active Date Inactive Code Status Order ID Comments User Context   This patient has a current code status but no historical code status.      TOTAL TIME TAKING CARE OF THIS PATIENT: 38 minutes.    Jazzlene Huot M.D on 06/06/2016 at 5:04 PM  Between 7am to 6pm - Pager - [phone]  After 6pm go to [email or web address] - password [email or web address] [facility]  Office  [phone]  CC: Primary care physician; Lenard Simmer, MD

## 2016-06-06 NOTE — ED Provider Notes (Signed)
[facility] Emergency Department Provider Note   ____________________________________________  Time seen: Approximately 8:07 AM  I have reviewed the triage vital signs and the nursing notes.   HISTORY  Chief Complaint Anxiety and Shortness of Breath    HPI Jesus Hendrix is a 70 y.o. male who states that he started feeling like he couldn't catch his breath this morning he got extremely anxious and felt like he was having a panic attack. Patient states that he generally has a anxiety reaction it only lasts for approximately 15-30 minutes but today it has lasted for 2 hours. Patient denies any significant chest pain, he just states that he feels extremely winded. Patient's wife also stated that last week patient had an event where he had near syncope, and had a 5-10 minute period of minimal responsiveness and inability to recognize his surroundings. Patient denies any headache, dizziness, nausea, vomiting, change in his bowels, paresthesias or weakness. Patient has no pain at this time. Patient had to be given IV Ativan on arrival to the ER secondary to his anxiety and panic attack.   Past Medical History  Diagnosis Date  . Hypertension   . Hyperlipidemia   . Coronary artery disease   . Hiatal hernia   . Heart murmur   . Chronic kidney disease   . Cancer of kidney ([place])   . Cardiomyopathy ([place])   . Choledocholithiasis   . COPD (chronic obstructive pulmonary disease) ([place])   . History of colonic diverticulitis   . DM (diabetes mellitus) ([place])   . GERD (gastroesophageal reflux disease)   . Gout   . Spinal stenosis   . Nephrolithiasis   . Umbilical hernia   . Vitamin D deficiency   . Wegener's granulomatosis with renal involvement ([place])   . Temporomandibular joint pain dysfunction syndrome   . Exogenous obesity     Patient Active Problem List   Diagnosis Date Noted  . Preop cardiovascular exam 11/15/2015  . Special screening for malignant  neoplasms, colon   . Benign neoplasm of sigmoid colon   . Rectal polyp   . Mild aortic valve stenosis 07/13/2014  . Carotid stenosis 07/13/2014  . Near syncope 04/15/2014  . Murmur 04/15/2014  . Bruit 04/15/2014  . Bradycardia 04/15/2014  . LBBB (left bundle branch block) 04/15/2014  . First degree AV block 04/15/2014  . Wegener's granulomatosis ([place]) 05/23/2013  . CAD (coronary artery disease) 01/08/2012  . 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 Hyperlipidemia 01/08/2012  . HTN (hypertension) 01/08/2012    Past Surgical History  Procedure Laterality Date  . Prostate surgery    . Coronary artery bypass graft  2004    CABG x 3  . Cardiac catheterization    . Hernia repair      x 2  . Eye surgery    . Intraocular lens insertion    . Hip surgery      right hip replacement  . Kidney surgery      cancer  . Cholecystectomy    . Colonoscopy N/A 06/03/2015    Procedure: COLONOSCOPY;  Surgeon: Lucilla Lame, MD;  Location: [facility];  Service: Gastroenterology;  Laterality: N/A;  . Polypectomy  06/03/2015    Procedure: POLYPECTOMY;  Surgeon: Lucilla Lame, MD;  Location: [facility];  Service: Gastroenterology;;    Current Outpatient Rx  Name  Route  Sig  Dispense  Refill  . acetaminophen (TYLENOL) 325 MG tablet   Oral   Take 650 mg  by mouth 3 (three) times daily as needed for moderate pain.         . cholecalciferol (VITAMIN D) 1000 UNITS tablet   Oral   Take 2,000 Units by mouth at bedtime.          . fluticasone (FLONASE) 50 MCG/ACT nasal spray   Each Nare   Place 2 sprays into both nostrils at bedtime.          . furosemide (LASIX) 20 MG tablet   Oral   Take 20 mg by mouth daily.         Marland Kitchen losartan (COZAAR) 50 MG tablet   Oral   Take 25 mg by mouth at bedtime.          . niacin (NIASPAN) 500 MG CR tablet   Oral   Take 500 mg by mouth daily.         . pantoprazole (PROTONIX) 40 MG tablet   Oral   Take 40 mg by mouth daily.         .  tamsulosin (FLOMAX) 0.4 MG CAPS capsule   Oral   Take 0.4 mg by mouth daily.         Marland Kitchen atorvastatin (LIPITOR) 40 MG tablet   Oral   Take 1 tablet (40 mg total) by mouth daily. Patient not taking: Reported on 06/06/2016   90 tablet   4   . ezetimibe (ZETIA) 10 MG tablet   Oral   Take 1 tablet (10 mg total) by mouth daily. Patient not taking: Reported on 06/06/2016   90 tablet   4     Allergies Review of patient's allergies indicates no known allergies.  Family History  Problem Relation Age of Onset  . Heart disease Mother   . Heart disease Sister   . Heart disease Brother     Social History Social History  Substance Use Topics  . Smoking status: Former Smoker -- 1.00 packs/day for 25 years    Types: Cigarettes    Quit date: 12/29/1974  . Smokeless tobacco: None  . Alcohol Use: 1.2 oz/week    2 Cans of beer per week     Comment: social    Review of Systems Constitutional: No fever/chills Eyes: No visual changes. ENT: No sore throat. Cardiovascular: Denies chest pain. Respiratory: Positive for shortness of breath. Gastrointestinal: No abdominal pain.  No nausea, no vomiting.  No diarrhea.  No constipation. Genitourinary: Negative for dysuria. Musculoskeletal: Negative for back pain. Skin: Negative for rash. Neurological: Negative for headaches, focal weakness or numbness. Patient had questionable near syncopal event a week ago. Psychological: Anxiety/panic attack  10-point ROS otherwise negative.  ____________________________________________   PHYSICAL EXAM:  VITAL SIGNS: ED Triage Vitals  Enc Vitals Group     BP --      Pulse --      Resp --      Temp --      Temp src --      SpO2 --      Weight --      Height --      Head Cir --      Peak Flow --      Pain Score --      Pain Loc --      Pain Edu? --      Excl. in [place]? --     Constitutional: Alert and oriented. Well appearing and in no acute distress. Eyes: Conjunctivae are normal. PERRL.  EOMI. Head: Atraumatic.  Nose: No congestion/rhinnorhea. Mouth/Throat: Mucous membranes are moist.  Oropharynx non-erythematous. Neck: No stridor.   Cardiovascular: Normal rate, Irregular rhythm. Patient with 4/6 systolic ejection murmur.  Good peripheral circulation. Respiratory: Normal respiratory effort.  No retractions. Lungs CTAB. Gastrointestinal: Soft and nontender. No distention. No abdominal bruits. No CVA tenderness. Musculoskeletal: No lower extremity tenderness but mild edema.  No joint effusions. Neurologic:  Normal speech and language. No gross focal neurologic deficits are appreciated. No gait instability. Skin:  Skin is warm, dry and intact. No rash noted. Psychiatric: Mood and affect are normal. Speech and behavior are normal.  ____________________________________________   LABS (all labs ordered are listed, but only abnormal results are displayed)  Labs Reviewed  BASIC METABOLIC PANEL - Abnormal; Notable for the following:    Glucose, Bld 136 (*)    Calcium 8.6 (*)    All other components within normal limits  CBC - Abnormal; Notable for the following:    RBC 4.05 (*)    HCT 37.9 (*)    RDW 14.7 (*)    All other components within normal limits  TROPONIN I - Abnormal; Notable for the following:    Troponin I 0.04 (*)    All other components within normal limits  BRAIN NATRIURETIC PEPTIDE   ____________________________________________  EKG  ED ECG REPORT I, Ruby Cola, the attending physician, personally viewed and interpreted this ECG.   Date: 06/06/2016  EKG Time: 8:05 AM  Rate: 89  Rhythm: atrial fibrillation, rate 89, LBBB  Axis: Normal  Intervals:left bundle branch block  ST&T Change: Nonspecific ST and T-wave changes associated with a left bundle branch block  ____________________________________________  RADIOLOGY Dg Chest 2 View  06/06/2016  CLINICAL DATA:  Shortness of breath. EXAM: CHEST  2 VIEW COMPARISON:  07/14/2011 FINDINGS:  Postsurgical changes from CABG is stable. Cardiomediastinal silhouette is normal. Mediastinal contours appear intact. There is no evidence of focal airspace consolidation, pleural effusion or pneumothorax. Osseous structures are without acute abnormality. Soft tissues are grossly normal. IMPRESSION: No active cardiopulmonary disease. Electronically Signed   By: Fidela Salisbury M.D.   On: 06/06/2016 08:52    ____________________________________________   PROCEDURES  Procedure(s) performed: None  Critical Care performed: No  ____________________________________________   INITIAL IMPRESSION / ASSESSMENT AND PLAN / ED COURSE  Pertinent labs & imaging results that were available during my care of the patient were reviewed by me and considered in my medical decision making (see chart for details).  9:52 AM Patient in atrial fibrillation at a rate 89. Patient has never had history of A. fib in the past. Patient's anxiety is much improved after IV Ativan. Patient also states that his shortness of breath has improved since arriving to the ER. Patient will get routine labs, EKG, chest x-ray at this time.  9:52 AM Patient's initial troponin was 0.04. Patient also had several periods of time where his atrial fibrillation dropped into the rate of 30s. Patient's blood pressure remained normal at that time. Patient will be admitted to Dr. Lavetta Nielsen the hospitalist for workup for elevated cardiac enzymes and new-onset atrial fibrillation. Patient will also get a CT of the head to evaluate for his near syncopal event that He had a week ago. Patient was given an aspirin in the ER. ____________________________________________   FINAL CLINICAL IMPRESSION(S) / ED DIAGNOSES  Final diagnoses:  Shortness of breath  Atrial fibrillation, unspecified type (HCC)  Cardiac enzymes elevated  Near syncope      NEW MEDICATIONS STARTED DURING  THIS VISIT:  New Prescriptions   No medications on file      Note:  This document was prepared using Dragon voice recognition software and may include unintentional dictation errors.    Ruby Cola, MD 06/06/16 [phone]

## 2016-06-06 NOTE — ED Notes (Signed)
Pt arrived to room reported anxiety.  Pt states that he can't sit still. Endorses diaphoresis.  Stated that around 0500 he woke up feeling "anxious" and restless.  He denies chest pain. Does reports feeling SOB.  Pt requesting something to "calm him down" He denies past history of anxiety.

## 2016-06-06 NOTE — Progress Notes (Signed)
Patient received discharge instructions, pt verbalized understanding. IV was removed with no signs of infection. Dressing clean, dry intact. No skin tears or wounds present. Prescription was printed and given to patient. Patient was escorted out with staff member via wheelchair via private auto. No further needs from care management team.  

## 2016-06-08 ENCOUNTER — Telehealth: Payer: Self-pay | Admitting: Cardiovascular Disease

## 2016-06-08 ENCOUNTER — Ambulatory Visit (INDEPENDENT_AMBULATORY_CARE_PROVIDER_SITE_OTHER): Payer: Medicare Other | Admitting: Cardiovascular Disease

## 2016-06-08 ENCOUNTER — Encounter: Payer: Self-pay | Admitting: Cardiovascular Disease

## 2016-06-08 VITALS — BP 130/84 | HR 67 | Ht 67.0 in | Wt 213.2 lb

## 2016-06-08 DIAGNOSIS — I447 Left bundle-branch block, unspecified: Secondary | ICD-10-CM

## 2016-06-08 DIAGNOSIS — I4819 Other persistent atrial fibrillation: Secondary | ICD-10-CM

## 2016-06-08 DIAGNOSIS — I2510 Atherosclerotic heart disease of native coronary artery without angina pectoris: Secondary | ICD-10-CM | POA: Diagnosis not present

## 2016-06-08 DIAGNOSIS — I481 Persistent atrial fibrillation: Secondary | ICD-10-CM

## 2016-06-08 DIAGNOSIS — R55 Syncope and collapse: Secondary | ICD-10-CM

## 2016-06-08 DIAGNOSIS — I159 Secondary hypertension, unspecified: Secondary | ICD-10-CM | POA: Diagnosis not present

## 2016-06-08 DIAGNOSIS — Z951 Presence of aortocoronary bypass graft: Secondary | ICD-10-CM

## 2016-06-08 DIAGNOSIS — E785 Hyperlipidemia, unspecified: Secondary | ICD-10-CM

## 2016-06-08 DIAGNOSIS — R0602 Shortness of breath: Secondary | ICD-10-CM

## 2016-06-08 MED ORDER — FUROSEMIDE 20 MG PO TABS: 20.0000 mg | ORAL_TABLET | Freq: Two times a day (BID) | ORAL | Status: DC | PRN

## 2016-06-08 NOTE — Telephone Encounter (Signed)
Spoke w/ pt's wife.  Advised her that I spoke w/ Stacy @ [employer]. Pt's monitor has been shipped and should arrive via [employer] no later than tomorrow evening.  She is appreciative and will call back if we can be of further assistance.

## 2016-06-08 NOTE — Progress Notes (Signed)
[name], male   DOB: [birth year], 70 y.o.   MRN: [id number] Cardiology Office Note  Date:  06/08/2016   ID:  Jesus Hendrix, DOB 06/15/1946, MRN [id number]  PCP:  Jesus Simmer, MD   Chief Complaint  Patient presents with  . other    C/o sob and dizziness. Meds reviewed verbally with pt.    HPI:  Jesus Hendrix is a very pleasant 70 year old gentleman with chronic back pain and sciatica down his left leg, history of coronary artery disease and bypass surgery in 2007, Wegener's granulomatosis, hyperlipidemia, obesity, kidney cancer, status post laser ablation at [facility], who presents for routine followup of his coronary artery disease And recent diagnosis of atrial fibrillation previous history of dizziness which he attributes to his Wegener's.   He was in the hospitalist past weekend for "panic attack", seen by myself, found to be in atrial fibrillation. Arrhythmia likely contributing to his shortness of breath He was discharged on anticoagulation, eliquis but reports this will cost him $370 per month Currently has free samples  Also discharged with metoprolol succinate 25 mg daily Reports having shortness of breath when he lays down at nighttime, feels it is from the new medication Denies any leg edema Significant problems with panic/anxiety, requesting Xanax  He continues to working long hours [employer], working with Psychologist, forensic, digging. No significant chest tightness with exertion.  EKG on today's visit shows atrial fibrillation with ventricular rate 67 bpm, left bundle branch block  Other past medical history Echocardiogram May 2015 with mild aortic valve stenosis, results were discussed with him  Previous cardiac catheterization in January 2007 details 75-80% long lesion in the LAD at the ostium, 95% lesion in the PDA. He was referred to bypass surgery. 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 Defect worse in the inferoapical region. No ischemia. Ejection fraction 55%. Exercised for 7 METS  Previous history of myalgias on high-dose cholesterol medication. Tolerating Lipitor 30 mg daily  PMH:   has a past medical history of Hypertension; Hyperlipidemia; Coronary artery disease; Hiatal hernia; Chronic kidney disease; Choledocholithiasis; COPD (chronic obstructive pulmonary disease) ([place]); History of colonic diverticulitis; DM (diabetes mellitus) ([place]); GERD (gastroesophageal reflux disease); Gout; Spinal stenosis; Nephrolithiasis; Umbilical hernia; Vitamin D deficiency; Wegener's granulomatosis with renal involvement ([place]); Temporomandibular joint pain dysfunction syndrome; Exogenous obesity; History of renal cell carcinoma; Chronic systolic CHF (congestive heart failure) ([place]); Atrial fibrillation ([place]) (06/06/2016); Near syncope; Aortic stenosis; Anxiety; and Panic attacks.  PSH:    Past Surgical History  Procedure Laterality Date  . Prostate surgery    . Coronary artery bypass graft  2004    CABG x 3  . Cardiac catheterization    . Hernia repair      x 2  . Eye surgery    . Intraocular lens insertion    . Hip surgery      right hip replacement  . Kidney surgery      cancer  . Cholecystectomy    . Colonoscopy N/A 06/03/2015    Procedure: COLONOSCOPY;  Surgeon: Jesus Lame, MD;  Location: [place];  Service: Gastroenterology;  Laterality: N/A;  . Polypectomy  06/03/2015    Procedure: POLYPECTOMY;  Surgeon: Jesus Lame, MD;  Location: [place];  Service: Gastroenterology;;  . Nose surgery  Current Outpatient Prescriptions  Medication Sig Dispense Refill  . acetaminophen (TYLENOL) 325 MG tablet Take 650 mg by mouth 3 (three) times daily as needed for moderate pain.    Marland Kitchen apixaban  (ELIQUIS) 5 MG TABS tablet Take 1 tablet (5 mg total) by mouth 2 (two) times daily. 60 tablet 2  . atorvastatin (LIPITOR) 40 MG tablet Take 1 tablet (40 mg total) by mouth daily. 90 tablet 4  . cholecalciferol (VITAMIN D) 1000 UNITS tablet Take 2,000 Units by mouth at bedtime.     . fluticasone (FLONASE) 50 MCG/ACT nasal spray Place 2 sprays into both nostrils at bedtime.     . furosemide (LASIX) 20 MG tablet Take 1 tablet (20 mg total) by mouth 2 (two) times daily as needed for fluid or edema. 60 tablet 6  . losartan (COZAAR) 50 MG tablet Take 25 mg by mouth at bedtime.     . metoprolol succinate (TOPROL-XL) 25 MG 24 hr tablet Take 1 tablet (25 mg total) by mouth daily. 30 tablet 2  . niacin (NIASPAN) 500 MG CR tablet Take 500 mg by mouth daily.    . pantoprazole (PROTONIX) 40 MG tablet Take 40 mg by mouth daily.    . tamsulosin (FLOMAX) 0.4 MG CAPS capsule Take 0.4 mg by mouth daily.     No current facility-administered medications for this visit.     Allergies:   Review of patient's allergies indicates no known allergies.   Social History:  The patient  reports that he quit smoking about 41 years ago. His smoking use included Cigarettes. He has a 25 pack-year smoking history. He does not have any smokeless tobacco history on file. He reports that he drinks about 1.2 oz of alcohol per week. He reports that he does not use illicit drugs.   Family History:   family history includes Heart disease in his brother, mother, and sister.    Review of Systems: Review of Systems  Constitutional: Negative.   Respiratory: Positive for shortness of breath.   Cardiovascular: Negative.   Gastrointestinal: Negative.   Musculoskeletal: Negative.   Neurological: Negative.   Psychiatric/Behavioral: The patient is nervous/anxious.   All other systems reviewed and are negative.    PHYSICAL EXAM: VS:  BP 130/84 mmHg  Pulse 67  Ht 5\' 7"  (1.702 m)  Wt 213 lb 4 oz (96.73 kg)  BMI 33.39 kg/m2 , BMI  Body mass index is 33.39 kg/(m^2). GEN: Well nourished, well developed, in no acute distress HEENT: normal Neck: no JVD, carotid bruits, or masses Cardiac: RRR; no murmurs, rubs, or gallops,no edema  Respiratory:  clear to auscultation bilaterally, normal work of breathing GI: soft, nontender, nondistended, + BS MS: no deformity or atrophy Skin: warm and dry, no rash Neuro:  Strength and sensation are intact Psych: euthymic mood, full affect    Recent Labs: 06/06/2016: B Natriuretic Peptide 98.0; BUN 19; Creatinine, Ser 0.96; Hemoglobin 13.5; Magnesium 1.9; Platelets 208; Potassium 3.8; Sodium 139; TSH 1.467    Lipid Panel No results found for: CHOL, HDL, LDLCALC, TRIG    Wt Readings from Last 3 Encounters:  06/08/16 213 lb 4 oz (96.73 kg)  06/06/16 216 lb 12.8 oz (98.34 kg)  11/15/15 162 lb 12.8 oz (73.846 kg)       ASSESSMENT AND PLAN:  Coronary artery disease involving native coronary artery of native heart without angina pectoris - Plan: EKG 12-Lead, ECHOCARDIOGRAM COMPLETE Currently with no symptoms of angina. No further workup at this time. Continue current  medication regimen.  Persistent atrial fibrillation ([place]) - Plan: ECHOCARDIOGRAM COMPLETE Noted to be in atrial fibrillation while in the hospital with shortness of breath, panic symptoms Unclear what role his anxiety is playing in his presentation, may benefit from benzodiazepines Started on anticoagulation and metoprolol for rate control He feels short of breath on the metoprolol succinate, we'll decrease the dose down to 12.5 mg daily  Secondary hypertension, unspecified Blood pressure is well controlled on today's visit. We'll decrease metoprolol as above  Hyperlipidemia Encouraged him to stay on his Lipitor  Near syncope Recent episodes of near syncope with quick recovery, unable to exclude termination pauses, paroxysmal atrial fibrillation. 30 day monitor on order, should receive today  S/P CABG x  3 Currently with no symptoms of angina. No further workup at this time. Continue current medication regimen.  Shortness of breath Likely secondary to underlying atrial fibrillation. Recommended he increase Lasix up to 20 mgrams twice a day for worsening shortness of breath symptoms   Total encounter time more than 25 minutes  Greater than 50% was spent in counseling and coordination of care with the patient   Disposition:   F/U  1 month   Orders Placed This Encounter  Procedures  . EKG 12-Lead  . ECHOCARDIOGRAM COMPLETE     Signed, Esmond Plants, M.D., Ph.D. 06/08/2016  [place], [place]

## 2016-06-08 NOTE — Patient Instructions (Addendum)
Medication Instructions:   Please cut the metoprolol succinate in 1/2 daily at night  Increase the lasix up to 20 mg twice a day until shortness of breath improves   Testing/Procedures:  We will schedule an echocardiogram for atrial fibrillaiton Echocardiography is a painless test that uses sound waves to create images of your heart. It provides your doctor with information about the size and shape of your heart and how well your heart's chambers and valves are working. This procedure takes approximately one hour. There are no restrictions for this procedure.  Date & time: ___________________________  Follow-Up: It was a pleasure seeing you in the office today. Please call us if you have new issues that need to be addressed before your next appt.  [phone]  Your physician wants you to follow-up in: 4 weeks   Date & time: ______________________________  If you need a refill on your cardiac medications before your next appointment, please call your pharmacy.    Echocardiogram An echocardiogram, or echocardiography, uses sound waves (ultrasound) to produce an image of your heart. The echocardiogram is simple, painless, obtained within a short period of time, and offers valuable information to your health care provider. The images from an echocardiogram can provide information such as:  Evidence of coronary artery disease (CAD).  Heart size.  Heart muscle function.  Heart valve function.  Aneurysm detection.  Evidence of a past heart attack.  Fluid buildup around the heart.  Heart muscle thickening.  Assess heart valve function. LET [facility] CARE PROVIDER KNOW ABOUT:  Any allergies you have.  All medicines you are taking, including vitamins, herbs, eye drops, creams, and over-the-counter medicines.  Previous problems you or members of your family have had with the use of anesthetics.  Any blood disorders you have.  Previous surgeries you have  had.  Medical conditions you have.  Possibility of pregnancy, if this applies. BEFORE THE PROCEDURE  No special preparation is needed. Eat and drink normally.  PROCEDURE   In order to produce an image of your heart, gel will be applied to your chest and a wand-like tool (transducer) will be moved over your chest. The gel will help transmit the sound waves from the transducer. The sound waves will harmlessly bounce off your heart to allow the heart images to be captured in real-time motion. These images will then be recorded.  You may need an IV to receive a medicine that improves the quality of the pictures. AFTER THE PROCEDURE You may return to your normal schedule including diet, activities, and medicines, unless your health care provider tells you otherwise.   This information is not intended to replace advice given to you by your health care provider. Make sure you discuss any questions you have with your health care provider.   Document Released: 12/01/2000 Document Revised: 12/25/2014 Document Reviewed: 08/11/2013 Elsevier Interactive Patient Education 2016 Elsevier Inc. Atrial Fibrillation Atrial fibrillation is a type of irregular or rapid heartbeat (arrhythmia). In atrial fibrillation, the heart quivers continuously in a chaotic pattern. This occurs when parts of the heart receive disorganized signals that make the heart unable to pump blood normally. This can increase the risk for stroke, heart failure, and other heart-related conditions. There are different types of atrial fibrillation, including:  Paroxysmal atrial fibrillation. This type starts suddenly, and it usually stops on its own shortly after it starts.  Persistent atrial fibrillation. This type often lasts longer than a week. It may stop on its own or with treatment.  Long-lasting persistent atrial fibrillation. This type lasts longer than 12 months.  Permanent atrial fibrillation. This type does not go away. Talk  with your health care provider to learn about the type of atrial fibrillation that you have. CAUSES This condition is caused by some heart-related conditions or procedures, including:  A heart attack.  Coronary artery disease.  Heart failure.  Heart valve conditions.  High blood pressure.  Inflammation of the sac that surrounds the heart (pericarditis).  Heart surgery.  Certain heart rhythm disorders, such as Wolf-Parkinson-White syndrome. Other causes include:  Pneumonia.  Obstructive sleep apnea.  Blockage of an artery in the lungs (pulmonary embolism, or PE).  Lung cancer.  Chronic lung disease.  Thyroid problems, especially if the thyroid is overactive (hyperthyroidism).  Caffeine.  Excessive alcohol use or illegal drug use.  Use of some medicines, including certain decongestants and diet pills. Sometimes, the cause cannot be found. RISK FACTORS This condition is more likely to develop in:  People who are older in age.  People who smoke.  People who have diabetes mellitus.  People who are overweight (obese).  Athletes who exercise vigorously. SYMPTOMS Symptoms of this condition include:  A feeling that your heart is beating rapidly or irregularly.  A feeling of discomfort or pain in your chest.  Shortness of breath.  Sudden light-headedness or weakness.  Getting tired easily during exercise. In some cases, there are no symptoms. DIAGNOSIS Your health care provider may be able to detect atrial fibrillation when taking your pulse. If detected, this condition may be diagnosed with:  An electrocardiogram (ECG).  A Holter monitor test that records your heartbeat patterns over a 24-hour period.  Transthoracic echocardiogram (TTE) to evaluate how blood flows through your heart.  Transesophageal echocardiogram (TEE) to view more detailed images of your heart.  A stress test.  Imaging tests, such as a CT scan or chest X-ray.  Blood  tests. TREATMENT The main goals of treatment are to prevent blood clots from forming and to keep your heart beating at a normal rate and rhythm. The type of treatment that you receive depends on many factors, such as your underlying medical conditions and how you feel when you are experiencing atrial fibrillation. This condition may be treated with:  Medicine to slow down the heart rate, bring the heart's rhythm back to normal, or prevent clots from forming.  Electrical cardioversion. This is a procedure that resets your heart's rhythm by delivering a controlled, low-energy shock to the heart through your skin.  Different types of ablation, such as catheter ablation, catheter ablation with pacemaker, or surgical ablation. These procedures destroy the heart tissues that send abnormal signals. When the pacemaker is used, it is placed under your skin to help your heart beat in a regular rhythm. HOME CARE INSTRUCTIONS  Take over-the counter and prescription medicines only as told by your health care provider.  If your health care provider prescribed a blood-thinning medicine (anticoagulant), take it exactly as told. Taking too much blood-thinning medicine can cause bleeding. If you do not take enough blood-thinning medicine, you will not have the protection that you need against stroke and other problems.  Do not use tobacco products, including cigarettes, chewing tobacco, and e-cigarettes. If you need help quitting, ask your health care provider.  If you have obstructive sleep apnea, manage your condition as told by your health care provider.  Do not drink alcohol.  Do not drink beverages that contain caffeine, such as coffee, soda, and tea.  Maintain a healthy weight. Do not use diet pills unless your health care provider approves. Diet pills may make heart problems worse.  Follow diet instructions as told by your health care provider.  Exercise regularly as told by your health care  provider.  Keep all follow-up visits as told by your health care provider. This is important. PREVENTION  Avoid drinking beverages that contain caffeine or alcohol.  Avoid certain medicines, especially medicines that are used for breathing problems.  Avoid certain herbs and herbal medicines, such as those that contain ephedra or ginseng.  Do not use illegal drugs, such as cocaine and amphetamines.  Do not smoke.  Manage your high blood pressure. SEEK MEDICAL CARE IF:  You notice a change in the rate, rhythm, or strength of your heartbeat.  You are taking an anticoagulant and you notice increased bruising.  You tire more easily when you exercise or exert yourself. SEEK IMMEDIATE MEDICAL CARE IF:  You have chest pain, abdominal pain, sweating, or weakness.  You feel nauseous.  You notice blood in your vomit, bowel movement, or urine.  You have shortness of breath.  You suddenly have swollen feet and ankles.  You feel dizzy.  You have sudden weakness or numbness of the face, arm, or leg, especially on one side of the body.  You have trouble speaking, trouble understanding, or both (aphasia).  Your face or your eyelid droops on one side. These symptoms may represent a serious problem that is an emergency. Do not wait to see if the symptoms will go away. Get medical help right away. Call your local emergency services (911 in the [place] Do not drive yourself to the hospital.   This information is not intended to replace advice given to you by your health care provider. Make sure you discuss any questions you have with your health care provider.   Document Released: 12/04/2005 Document Revised: 08/25/2015 Document Reviewed: 03/31/2015 Elsevier Interactive Patient Education [employer].

## 2016-06-08 NOTE — Telephone Encounter (Signed)
Please call patient wife to clarify if holter monitor has been mailed.  Daughter is concerned that they have not heard anything yet  Also she wants to make sure pcp is aware of recent hospital visit and care plan.  Please call wife or patient (he is very HOH)

## 2016-06-09 ENCOUNTER — Encounter (INDEPENDENT_AMBULATORY_CARE_PROVIDER_SITE_OTHER): Payer: Medicare Other

## 2016-06-09 DIAGNOSIS — I480 Paroxysmal atrial fibrillation: Secondary | ICD-10-CM

## 2016-06-09 DIAGNOSIS — R55 Syncope and collapse: Secondary | ICD-10-CM | POA: Diagnosis not present

## 2016-06-13 ENCOUNTER — Ambulatory Visit (INDEPENDENT_AMBULATORY_CARE_PROVIDER_SITE_OTHER): Payer: Medicare Other

## 2016-06-13 ENCOUNTER — Other Ambulatory Visit: Payer: Self-pay

## 2016-06-13 DIAGNOSIS — I481 Persistent atrial fibrillation: Secondary | ICD-10-CM

## 2016-06-13 DIAGNOSIS — I447 Left bundle-branch block, unspecified: Secondary | ICD-10-CM | POA: Diagnosis not present

## 2016-06-13 DIAGNOSIS — I2510 Atherosclerotic heart disease of native coronary artery without angina pectoris: Secondary | ICD-10-CM

## 2016-06-13 DIAGNOSIS — I4819 Other persistent atrial fibrillation: Secondary | ICD-10-CM

## 2016-06-13 LAB — ECHOCARDIOGRAM COMPLETE
AO mean calculated velocity dopler: 149 cm/s
AV Area VTI: 1.36 cm2
AV Area mean vel: 1.3 cm2
AV VEL mean LVOT/AV: 0.41
AV area mean vel ind: 0.6 cm2/m2
AV peak Index: 0.63
AV pk vel: 242 cm/s
AVA: 1.35 cm2
AVAREAVTIIND: 0.62 cm2/m2
AVG: 11 mmHg
AVPG: 23 mmHg
Ao pk vel: 0.43 m/s
CHL CUP AV VEL: 1.35
CHL CUP MV DEC (S): 222
EERAT: 11.05
EWDT: 222 ms
FS: 23 % — AB (ref 28–44)
IVS/LV PW RATIO, ED: 0.94
LA diam index: 2.44 cm/m2
LA vol index: 42.6 mL/m2
LA vol: 92.6 mL
LASIZE: 53 mm
LAVOLA4C: 107 mL
LEFT ATRIUM END SYS DIAM: 53 mm
LV TDI E'LATERAL: 9.14
LV TDI E'MEDIAL: 6.96
LV e' LATERAL: 9.14 cm/s
LVEEAVG: 11.05
LVEEMED: 11.05
LVOT VTI: 18 cm
LVOT area: 3.14 cm2
LVOT diameter: 20 mm
LVOT peak VTI: 0.43 cm
LVOT peak vel: 105 cm/s
LVOTSV: 57 mL
MV pk E vel: 101 m/s
MVPG: 4 mmHg
PW: 10.8 mm — AB (ref 0.6–1.1)
VTI: 41.9 cm
Valve area index: 0.62

## 2016-07-21 ENCOUNTER — Encounter: Payer: Self-pay | Admitting: Cardiovascular Disease

## 2016-07-21 ENCOUNTER — Ambulatory Visit (INDEPENDENT_AMBULATORY_CARE_PROVIDER_SITE_OTHER): Payer: Medicare Other | Admitting: Cardiovascular Disease

## 2016-07-21 VITALS — BP 100/60 | HR 69 | Ht 66.0 in | Wt 215.2 lb

## 2016-07-21 DIAGNOSIS — R0602 Shortness of breath: Secondary | ICD-10-CM

## 2016-07-21 DIAGNOSIS — I255 Ischemic cardiomyopathy: Secondary | ICD-10-CM | POA: Diagnosis not present

## 2016-07-21 DIAGNOSIS — I159 Secondary hypertension, unspecified: Secondary | ICD-10-CM | POA: Diagnosis not present

## 2016-07-21 DIAGNOSIS — I2510 Atherosclerotic heart disease of native coronary artery without angina pectoris: Secondary | ICD-10-CM | POA: Diagnosis not present

## 2016-07-21 DIAGNOSIS — F419 Anxiety disorder, unspecified: Secondary | ICD-10-CM

## 2016-07-21 DIAGNOSIS — Z951 Presence of aortocoronary bypass graft: Secondary | ICD-10-CM

## 2016-07-21 DIAGNOSIS — I4891 Unspecified atrial fibrillation: Secondary | ICD-10-CM

## 2016-07-21 NOTE — Patient Instructions (Addendum)
Medication Instructions:  Use up the eliquis (blood thinner pill)  Next week we will start warfarin (generic)  Labwork: No new labs  Testing/Procedures: We will order a lexiscan myoview for abnormal echocardiogram, cardiomyopathy  Follow-Up: It was a pleasure seeing you in the office today. Please call us if you have new issues that need to be addressed before your next appt.  [phone]  Your physician wants you to follow-up in: 3 months.  You will receive a reminder letter in the mail two months in advance. If you don't receive a letter, please call our office to schedule the follow-up appointment.  If you need a refill on your cardiac medications before your next appointment, please call your pharmacy.  [place]  Your caregiver has ordered a Stress Test with nuclear imaging. The purpose of this test is to evaluate the blood supply to your heart muscle. This procedure is referred to as a "Non-Invasive Stress Test." This is because other than having an IV started in your vein, nothing is inserted or "invades" your body. Cardiac stress tests are done to find areas of poor blood flow to the heart by determining the extent of coronary artery disease (CAD). Some patients exercise on a treadmill, which naturally increases the blood flow to your heart, while others who are  unable to walk on a treadmill due to physical limitations have a pharmacologic/chemical stress agent called Lexiscan . This medicine will mimic walking on a treadmill by temporarily increasing your coronary blood flow.   Please note: these test may take anywhere between 2-4 hours to complete  PLEASE REPORT TO [place] AT THE FIRST DESK WILL DIRECT YOU WHERE TO GO  Date of Procedure:_____Thursday, August 17______  Arrival Time for Procedure:_____7:15 am___________  Instructions regarding medication:    __X__:  Hold METOPROLOL the night before and morning of procedure  How to  prepare for your Myoview test:  1. Do not eat or drink after midnight 2. No caffeine for 24 hours prior to test 3. No smoking 24 hours prior to test. 4. Your medication may be taken with water.  If your doctor stopped a medication because of this test, do not take that medication. 5. Ladies, please do not wear dresses.  Skirts or pants are appropriate. Please wear a short sleeve shirt. 6. No perfume, cologne or lotion.   Cardiac Nuclear Scanning A cardiac nuclear scan is used to check your heart for problems, such as the following:  A portion of the heart is not getting enough blood.  Part of the heart muscle has died, which happens with a heart attack.  The heart wall is not working normally.  In this test, a radioactive dye (tracer) is injected into your bloodstream. After the tracer has traveled to your heart, a scanning device is used to measure how much of the tracer is absorbed by or distributed to various areas of your heart. LET [facility] CARE PROVIDER KNOW ABOUT:  Any allergies you have.  All medicines you are taking, including vitamins, herbs, eye drops, creams, and over-the-counter medicines.  Previous problems you or members of your family have had with the use of anesthetics.  Any blood disorders you have.  Previous surgeries you have had.  Medical conditions you have.  RISKS AND COMPLICATIONS Generally, this is a safe procedure. However, as with any procedure, problems can occur. Possible problems include:   Serious chest pain.  Rapid heartbeat.  Sensation of warmth in your  chest. This usually passes quickly. BEFORE THE PROCEDURE Ask your health care provider about changing or stopping your regular medicines. PROCEDURE This procedure is usually done at a hospital and takes 2-4 hours.  An IV tube is inserted into one of your veins.  Your health care provider will inject a small amount of radioactive tracer through the tube.  You will then wait for  20-40 minutes while the tracer travels through your bloodstream.  You will lie down on an exam table so images of your heart can be taken. Images will be taken for about 15-20 minutes.  You will exercise on a treadmill or stationary bike. While you exercise, your heart activity will be monitored with an electrocardiogram (ECG), and your blood pressure will be checked.  If you are unable to exercise, you may be given a medicine to make your heart beat faster.  When blood flow to your heart has peaked, tracer will again be injected through the IV tube.  After 20-40 minutes, you will get back on the exam table and have more images taken of your heart.  When the procedure is over, your IV tube will be removed. AFTER THE PROCEDURE  You will likely be able to leave shortly after the test. Unless your health care provider tells you otherwise, you may return to your normal schedule, including diet, activities, and medicines.  Make sure you find out how and when you will get your test results.   This information is not intended to replace advice given to you by your health care provider. Make sure you discuss any questions you have with your health care provider.   Document Released: 12/29/2004 Document Revised: 12/09/2013 Document Reviewed: 11/12/2013 Elsevier Interactive Patient Education [employer].

## 2016-07-21 NOTE — Progress Notes (Signed)
[name], male   DOB: 1946/01/02, 70 y.o.   MRN: [id number] Cardiology Office Note  Date:  07/21/2016   ID:  Jesus Hendrix, DOB Feb 25, 1946, MRN [id number]  PCP:  Lenard Simmer, MD   Chief Complaint  Patient presents with  . Other    follow up from echo. Meds reviewed by the patient verbally. "doing well." Needs a cardiac clearance for left hip surgery.     HPI:  Jesus Hendrix is a very pleasant 70 year old gentleman with chronic back pain and sciatica down his left leg, history of coronary artery disease and bypass surgery in 2007, Wegener's granulomatosis, hyperlipidemia, obesity, kidney cancer, status post laser ablation at [facility], who presents for routine followup of his coronary artery disease And recent diagnosis of atrial fibrillation previous history of dizziness which he attributes to his Wegener's.  In follow-up today, he reports that he is unable to afford elqiuis He does not have a pharmaceutical plan The medication will cost $400 He has been taking this once a day instead of twice a day Also reports the medication does not make him feel well, makes him tired He did take this twice a day for a month, then only once a day for the past several weeks  He has been taking care of his wife who had recent fall, wrist fracture, "needs to go back to work"  Echocardiogram reviewed with him showing moderately depressed ejection fraction, anterior wall hypokinesis   He does report shortness of breath has improved though he has not been working and exerting himself   issues with anxiety, previously requested Xanax Denies any leg edema  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 No significant chest tightness with exertion.  EKG on today's visit shows atrial fibrillation with ventricular rate 73 bpm, left bundle branch block  Other past medical history Echocardiogram May 2015 with mild aortic valve stenosis, results were discussed  with him  Previous cardiac catheterization in January 2007 details 75-80% long lesion in the LAD at the ostium, 95% lesion in the PDA. He was referred to bypass surgery. 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 Defect worse in the inferoapical region. No ischemia. Ejection fraction 55%. Exercised for 7 METS  Previous history of myalgias on high-dose cholesterol medication. Tolerating Lipitor 30 mg daily  PMH:   has a past medical history of Anxiety; Aortic stenosis; Atrial fibrillation ([place]) (06/06/2016); Choledocholithiasis; Chronic kidney disease; Chronic systolic CHF (congestive heart failure) ([place]); COPD (chronic obstructive pulmonary disease) ([place]); Coronary artery disease; DM (diabetes mellitus) ([place]); Exogenous obesity; GERD (gastroesophageal reflux disease); Gout; Hiatal hernia; History of colonic diverticulitis; History of renal cell carcinoma; Hyperlipidemia; Hypertension; Near syncope; Nephrolithiasis; Panic attacks; Spinal stenosis; Temporomandibular joint pain dysfunction syndrome; Umbilical hernia; Vitamin D deficiency; and Wegener's granulomatosis with renal involvement ([place]).  PSH:    Past Surgical History:  Procedure Laterality Date  . CARDIAC CATHETERIZATION    . CHOLECYSTECTOMY    . COLONOSCOPY N/A 06/03/2015   Procedure: COLONOSCOPY;  Surgeon: Lucilla Lame, MD;  Location: [place];  Service: Gastroenterology;  Laterality: N/A;  . CORONARY ARTERY BYPASS GRAFT  2004   CABG x 3  . EYE SURGERY    . HERNIA REPAIR     x 2  .  HIP SURGERY     right hip replacement  . INTRAOCULAR LENS INSERTION    . KIDNEY SURGERY     cancer  . NOSE SURGERY    . POLYPECTOMY  06/03/2015   Procedure: POLYPECTOMY;  Surgeon: Lucilla Lame, MD;  Location:  [place];  Service: Gastroenterology;;  . PROSTATE SURGERY      Current Outpatient Prescriptions  Medication Sig Dispense Refill  . acetaminophen (TYLENOL) 325 MG tablet Take 650 mg by mouth 3 (three) times daily as needed for moderate pain.    Marland Kitchen apixaban (ELIQUIS) 5 MG TABS tablet Take 1 tablet (5 mg total) by mouth 2 (two) times daily. 60 tablet 2  . atorvastatin (LIPITOR) 40 MG tablet Take 1 tablet (40 mg total) by mouth daily. 90 tablet 4  . cholecalciferol (VITAMIN D) 1000 UNITS tablet Take 2,000 Units by mouth at bedtime.     . clonazePAM (KLONOPIN) 1 MG tablet Take 1 mg by mouth 2 (two) times daily.    . fluticasone (FLONASE) 50 MCG/ACT nasal spray Place 2 sprays into both nostrils at bedtime.     . furosemide (LASIX) 20 MG tablet Take 1 tablet (20 mg total) by mouth 2 (two) times daily as needed for fluid or edema. 60 tablet 6  . losartan (COZAAR) 50 MG tablet Take 25 mg by mouth at bedtime.     . metoprolol succinate (TOPROL-XL) 25 MG 24 hr tablet Take 1 tablet (25 mg total) by mouth daily. 30 tablet 2  . niacin (NIASPAN) 500 MG CR tablet Take 500 mg by mouth daily.    . pantoprazole (PROTONIX) 40 MG tablet Take 40 mg by mouth daily.    . tamsulosin (FLOMAX) 0.4 MG CAPS capsule Take 0.4 mg by mouth daily.     No current facility-administered medications for this visit.      Allergies:   Review of patient's allergies indicates no known allergies.   Social History:  The patient  reports that he quit smoking about 41 years ago. His smoking use included Cigarettes. He has a 25.00 pack-year smoking history. He has never used smokeless tobacco. He reports that he drinks about 1.2 oz of alcohol per week . He reports that he does not use drugs.   Family History:   family history includes Heart disease in his brother, mother, and sister.    Review of Systems: Review of Systems  Constitutional: Negative.   Respiratory: Positive for shortness of breath.   Cardiovascular:  Negative.   Gastrointestinal: Negative.   Musculoskeletal: Negative.   Neurological: Negative.   Psychiatric/Behavioral: The patient is nervous/anxious.   All other systems reviewed and are negative.    PHYSICAL EXAM: VS:  BP 100/60 (BP Location: Left Arm, Patient Position: Sitting, Cuff Size: Normal)   Pulse 69   Ht 5\' 6"  (1.676 m)   Wt 215 lb 4 oz (97.6 kg)   BMI 34.74 kg/m  , BMI Body mass index is 34.74 kg/m. GEN: Well nourished, well developed, in no acute distress HEENT: normal Neck: no JVD, carotid bruits, or masses Cardiac: Irregularly irregular, no murmurs, rubs, or gallops,no edema  Respiratory:  clear to auscultation bilaterally, normal work of breathing GI: soft, nontender, nondistended, + BS MS: no deformity or atrophy Skin: warm and dry, no rash Neuro:  Strength and sensation are intact Psych: euthymic mood, full affect    Recent Labs: 06/06/2016: B Natriuretic Peptide 98.0; BUN 19; Creatinine, Ser 0.96; Hemoglobin 13.5; Magnesium 1.9; Platelets 208; Potassium 3.8; Sodium  139; TSH 1.467    Lipid Panel No results found for: CHOL, HDL, LDLCALC, TRIG    Wt Readings from Last 3 Encounters:  07/21/16 215 lb 4 oz (97.6 kg)  06/08/16 213 lb 4 oz (96.7 kg)  06/06/16 216 lb 12.8 oz (98.3 kg)       ASSESSMENT AND PLAN:  Coronary artery disease involving native coronary artery of native heart without angina pectoris - Plan: EKG 12-Lead, ECHOCARDIOGRAM COMPLETE Echocardiogram with anterior wall hypokinesis We have ordered a stress test to rule out ischemia given history of coronary disease and bypass  Persistent atrial fibrillation ([place]) - Plan: ECHOCARDIOGRAM COMPLETE Long discussion with him concerning anticoagulation He is unable to afford eliquis as he does not have a pharmaceutical plan We have recommended he take eliquis twice a day until he runs out of samples or prescription We have scheduled an appointment with Coumadin clinic He feels that he might  be able to do this better  Secondary hypertension, unspecified Blood pressure is well controlled on today's visit. No medication changes  Hyperlipidemia Encouraged him to stay on his Lipitor  Near syncope Denies any near-syncope or syncope No further medication changes  S/P CABG x 3 Stress test as above  Shortness of breath Improved symptoms with rate control and Lasix    Total encounter time more than 25 minutes  Greater than 50% was spent in counseling and coordination of care with the patient   Disposition:   F/U  3 months   Orders Placed This Encounter  Procedures  . NM Myocar Multi W/Spect W/Wall Motion / EF  . EKG 12-Lead     Signed, Esmond Plants, M.D., Ph.D. 07/21/2016  [place], [place]

## 2016-07-26 ENCOUNTER — Telehealth: Payer: Self-pay

## 2016-07-26 MED ORDER — WARFARIN SODIUM 5 MG PO TABS: ORAL_TABLET | ORAL | 1 refills | Status: DC

## 2016-07-26 NOTE — Telephone Encounter (Signed)
Pt was scheduled in Coumadin Clinic for transition from Eliquis to Coumadin.  Called spoke with pt and pt's wife advised no need to come into office today for an appt given the fact he has not yet started on Coumadin.  Cancelled appt in clinic for today 07/26/16 and rescheduled appt to 08/09/16.  Pt's wife states he has 2 boxes of Elquis left with 1 week supply #14 tablets in each left.  Pt denies having any adverse effects of Eliquis, the only reason he is switching is secondary to cost.  Advised pt he can continue on Eliquis until he has 3 days (6 tablets) of Eliquis left, or almost finished with current supply.  At that time pt will need to start taking Warfarin (Coumadin) 5mg  daily in the evening in addition to taking his Eliquis.  Explained to pt the reason we overlap these drugs is because Eliquis works quickly after taking dosage, but Warfarin (Coumadin) is a drug that has to build up in his system and the dosage of Warfarin that he takes today will not show up in his system for 2-3 days and it will take 5-7 days on the appropriate dosage of Warfarin to become therapeutic and protected from a clot or a stroke.  Pt verbalized understanding. Decided with pt to have him start taking Warfarin 5mg  daily on 08/06/16.  He will continue taking his Eliquis as well twice daily.  He will overlap these drugs for 3 days 8/20, 8/21, and 8/22.  On 08/09/16 he will stop taking Eliquis and continue taking the Warfarin 5mg  daily.  Made an appt in [facility] for pt on 08/09/16 at 3:00 pm to check pt's INR and educate him on Warfarin as well at that time. Forwarded a copy of these instructions via email to pt's wife per their request. Sent rx for Warfarin to [place].

## 2016-08-03 ENCOUNTER — Encounter
Admission: RE | Admit: 2016-08-03 | Discharge: 2016-08-03 | Disposition: A | Discharge status: Home / Self Care | Payer: Medicare Other | Source: Ambulatory Visit | Attending: Cardiovascular Disease | Admitting: Cardiovascular Disease

## 2016-08-03 DIAGNOSIS — I2510 Atherosclerotic heart disease of native coronary artery without angina pectoris: Secondary | ICD-10-CM | POA: Diagnosis present

## 2016-08-03 DIAGNOSIS — I255 Ischemic cardiomyopathy: Secondary | ICD-10-CM | POA: Diagnosis not present

## 2016-08-03 DIAGNOSIS — G8929 Other chronic pain: Secondary | ICD-10-CM

## 2016-08-03 DIAGNOSIS — I4891 Unspecified atrial fibrillation: Secondary | ICD-10-CM | POA: Diagnosis present

## 2016-08-03 DIAGNOSIS — M25559 Pain in unspecified hip: Secondary | ICD-10-CM

## 2016-08-03 HISTORY — DX: Other chronic pain: G89.29

## 2016-08-03 HISTORY — DX: Pain in unspecified hip: M25.559

## 2016-08-03 LAB — NM MYOCAR MULTI W/SPECT W/WALL MOTION / EF
CHL CUP NUCLEAR SDS: 0
CHL CUP NUCLEAR SRS: 7
CHL CUP NUCLEAR SSS: 1
CSEPPHR: 114 {beats}/min
LV dias vol: 146 mL (ref 62–150)
LV sys vol: 74 mL
Percent HR: 76 %
Rest HR: 77 {beats}/min
TID: 1.06

## 2016-08-03 MED ORDER — TECHNETIUM TC 99M TETROFOSMIN IV KIT
33.0000 | PACK | Freq: Once | INTRAVENOUS | Status: AC | PRN
  Administered 2016-08-03: 31.646 via INTRAVENOUS

## 2016-08-03 MED ORDER — REGADENOSON 0.4 MG/5ML IV SOLN
0.4000 mg | Freq: Once | INTRAVENOUS | Status: DC
  Filled 2016-08-03: qty 5

## 2016-08-03 MED ORDER — REGADENOSON 0.4 MG/5ML IV SOLN
0.4000 mg | Freq: Once | INTRAVENOUS | Status: AC
  Administered 2016-08-03: 0.4 mg via INTRAVENOUS
  Filled 2016-08-03: qty 5

## 2016-08-03 MED ORDER — TECHNETIUM TC 99M TETROFOSMIN IV KIT
13.0000 | PACK | Freq: Once | INTRAVENOUS | Status: AC | PRN
  Administered 2016-08-03: 12.162 via INTRAVENOUS

## 2016-08-09 ENCOUNTER — Ambulatory Visit (INDEPENDENT_AMBULATORY_CARE_PROVIDER_SITE_OTHER): Payer: Medicare Other

## 2016-08-09 DIAGNOSIS — I4891 Unspecified atrial fibrillation: Secondary | ICD-10-CM | POA: Diagnosis not present

## 2016-08-09 DIAGNOSIS — Z7901 Long term (current) use of anticoagulants: Secondary | ICD-10-CM | POA: Diagnosis not present

## 2016-08-09 LAB — POCT INR: INR: 1.6

## 2016-08-09 NOTE — Patient Instructions (Signed)

## 2016-08-16 ENCOUNTER — Ambulatory Visit (INDEPENDENT_AMBULATORY_CARE_PROVIDER_SITE_OTHER): Payer: Medicare Other | Admitting: *Deleted

## 2016-08-16 DIAGNOSIS — I4891 Unspecified atrial fibrillation: Secondary | ICD-10-CM | POA: Diagnosis not present

## 2016-08-16 DIAGNOSIS — Z7901 Long term (current) use of anticoagulants: Secondary | ICD-10-CM

## 2016-08-16 LAB — POCT INR: INR: 2

## 2016-08-22 ENCOUNTER — Telehealth: Payer: Self-pay | Admitting: Cardiovascular Disease

## 2016-08-22 NOTE — Telephone Encounter (Signed)
Received cardiac clearance request for pt to proceed w/ left TKR w/ Dr. Joni Fears, date not yet scheduled pending this clearance.  Pt is on coumadin and will need instructions for this, as well.  Please fax clearance and recommendations to [facility] @ [phone].

## 2016-08-23 ENCOUNTER — Ambulatory Visit (INDEPENDENT_AMBULATORY_CARE_PROVIDER_SITE_OTHER): Payer: Medicare Other

## 2016-08-23 DIAGNOSIS — Z7901 Long term (current) use of anticoagulants: Secondary | ICD-10-CM | POA: Diagnosis not present

## 2016-08-23 DIAGNOSIS — I4891 Unspecified atrial fibrillation: Secondary | ICD-10-CM | POA: Diagnosis not present

## 2016-08-23 LAB — POCT INR: INR: 1.5

## 2016-08-23 MED ORDER — WARFARIN SODIUM 5 MG PO TABS
ORAL_TABLET | ORAL | 1 refills | Status: DC
Start: 2016-08-23 — End: 2016-10-18

## 2016-08-28 NOTE — Telephone Encounter (Signed)
Acceptable risk for procedure Would stop warfarin 5 days prior to knee surgery Restart warfarin following surgery

## 2016-08-29 NOTE — Telephone Encounter (Signed)
Routed clearance to [place] through [employer].

## 2016-08-30 ENCOUNTER — Ambulatory Visit (INDEPENDENT_AMBULATORY_CARE_PROVIDER_SITE_OTHER): Payer: Medicare Other | Admitting: *Deleted

## 2016-08-30 DIAGNOSIS — Z7901 Long term (current) use of anticoagulants: Secondary | ICD-10-CM | POA: Diagnosis not present

## 2016-08-30 DIAGNOSIS — I4891 Unspecified atrial fibrillation: Secondary | ICD-10-CM | POA: Diagnosis not present

## 2016-08-30 LAB — POCT INR: INR: 1.3

## 2016-09-06 ENCOUNTER — Ambulatory Visit (INDEPENDENT_AMBULATORY_CARE_PROVIDER_SITE_OTHER): Payer: Medicare Other | Admitting: *Deleted

## 2016-09-06 DIAGNOSIS — I4891 Unspecified atrial fibrillation: Secondary | ICD-10-CM

## 2016-09-06 DIAGNOSIS — Z7901 Long term (current) use of anticoagulants: Secondary | ICD-10-CM | POA: Diagnosis not present

## 2016-09-06 LAB — POCT INR: INR: 1.9

## 2016-09-12 ENCOUNTER — Ambulatory Visit (INDEPENDENT_AMBULATORY_CARE_PROVIDER_SITE_OTHER): Payer: Medicare Other

## 2016-09-12 DIAGNOSIS — Z7901 Long term (current) use of anticoagulants: Secondary | ICD-10-CM | POA: Diagnosis not present

## 2016-09-12 DIAGNOSIS — I4891 Unspecified atrial fibrillation: Secondary | ICD-10-CM | POA: Diagnosis not present

## 2016-09-12 LAB — POCT INR: INR: 2.3

## 2016-09-18 ENCOUNTER — Telehealth: Payer: Self-pay | Admitting: Cardiovascular Disease

## 2016-09-18 NOTE — Telephone Encounter (Signed)
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   Please call when ready for pick up.  Placed in nurse inbox.

## 2016-09-20 NOTE — Telephone Encounter (Signed)
Note has been completed On my desk

## 2016-09-21 NOTE — Telephone Encounter (Signed)
Spoke w/ pt.  Advised him that we can provide him w/ requested info and there is no need to send everything to [facility] He is agreeable and will p/u clearance letter this afternoon.

## 2016-09-21 NOTE — Telephone Encounter (Signed)
They need your signature on the 2nd page of the paperwork.  I put it back on your desk.

## 2016-09-21 NOTE — Telephone Encounter (Signed)
Letter typed & signed by Dr. Rockey Situ. Left at front desk w/ lexi & ECHO results for pt to p/u at his convenience.

## 2016-10-02 ENCOUNTER — Ambulatory Visit (INDEPENDENT_AMBULATORY_CARE_PROVIDER_SITE_OTHER): Payer: Medicare Other | Admitting: *Deleted

## 2016-10-02 ENCOUNTER — Encounter: Payer: Self-pay | Admitting: Cardiovascular Disease

## 2016-10-02 ENCOUNTER — Ambulatory Visit (INDEPENDENT_AMBULATORY_CARE_PROVIDER_SITE_OTHER): Payer: Medicare Other | Admitting: Cardiovascular Disease

## 2016-10-02 VITALS — BP 138/84 | HR 77 | Ht 67.0 in | Wt 221.8 lb

## 2016-10-02 DIAGNOSIS — I10 Essential (primary) hypertension: Secondary | ICD-10-CM | POA: Diagnosis not present

## 2016-10-02 DIAGNOSIS — I2510 Atherosclerotic heart disease of native coronary artery without angina pectoris: Secondary | ICD-10-CM

## 2016-10-02 DIAGNOSIS — I6523 Occlusion and stenosis of bilateral carotid arteries: Secondary | ICD-10-CM

## 2016-10-02 DIAGNOSIS — I482 Chronic atrial fibrillation, unspecified: Secondary | ICD-10-CM

## 2016-10-02 DIAGNOSIS — E7800 Pure hypercholesterolemia, unspecified: Secondary | ICD-10-CM | POA: Diagnosis not present

## 2016-10-02 DIAGNOSIS — Z7901 Long term (current) use of anticoagulants: Secondary | ICD-10-CM

## 2016-10-02 DIAGNOSIS — I4891 Unspecified atrial fibrillation: Secondary | ICD-10-CM

## 2016-10-02 DIAGNOSIS — Z951 Presence of aortocoronary bypass graft: Secondary | ICD-10-CM

## 2016-10-02 DIAGNOSIS — I255 Ischemic cardiomyopathy: Secondary | ICD-10-CM

## 2016-10-02 LAB — POCT INR: INR: 3.5

## 2016-10-02 NOTE — Patient Instructions (Signed)

## 2016-10-02 NOTE — Progress Notes (Signed)
[name], male   DOB: 07/16/1946, 70 y.o.   MRN: [id number] Cardiology Office Note  Date:  10/02/2016   ID:  Jesus Hendrix, DOB October 04, 1946, MRN [id number]  PCP:  Jesus Simmer, MD   Jesus Complaint  Patient presents with  . other    3 mo f/u.  Cardiac clearance for hip surgery.    HPI:  Jesus Hendrix is a very pleasant 70 year old gentleman with chronic back pain and sciatica down his left leg, history of coronary artery disease and bypass surgery in 2007, Wegener's granulomatosis, hyperlipidemia, obesity, kidney cancer, status post laser ablation at [facility], who presents for routine followup of his coronary artery disease, atrial fibrillation previous history of dizziness which he attributes to his Wegener's.  On his last clinic visit, he reported that he was unable to afford elqiuis (also reports that he did not make him feel well, was only taking it once a day instead of twice a day  as directed) He does not have a pharmaceutical plan He was started on warfarin, But now reports that this medication makes his testicles itch He would like to switch back after he signs up for a new plan  He is having significant left hip and back pain Trouble walking  He is scheduled for Left hip replacement Jesus Hendrix will be performing the surgery  EKG on today's visit shows atrial fibrillation with ventricular rate 77 bpm, left bundle branch block  Previously taking care of his wife who had recent fall, wrist fracture, "needs to go back to work"  Echocardiogram reviewed with him showing moderately depressed ejection fraction, anterior wall hypokinesis    issues with anxiety, previously requested Xanax  In the past was working long hours Jesus Hendrix, working with Jesus Hendrix, digging. No significant chest tightness with exertion.  Echocardiogram May 2015 with mild aortic valve stenosis, results were discussed with him  Previous cardiac catheterization in January 2007  details 75-80% long lesion in the LAD at the ostium, 95% lesion in the PDA. He was referred to bypass surgery. 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 Defect worse in the inferoapical region. No ischemia. Ejection fraction 55%. Exercised for 7 METS  Previous history of myalgias on high-dose cholesterol medication. Tolerating Lipitor 30 mg daily  PMH:   has a past medical history of Anxiety; Aortic stenosis; Atrial fibrillation ([place]) (06/06/2016); Choledocholithiasis; Chronic kidney disease; Chronic systolic CHF (congestive heart failure) ([place]); COPD (chronic obstructive pulmonary disease) ([place]); Coronary artery disease; DM (diabetes mellitus) ([place]); Exogenous obesity; GERD (gastroesophageal reflux disease); Gout; Hiatal hernia; Hip pain, chronic (08/03/2016); History of colonic diverticulitis; History of renal cell carcinoma; Hyperlipidemia; Hypertension; Near syncope; Nephrolithiasis; Panic attacks; Spinal stenosis; Temporomandibular joint pain dysfunction syndrome; Umbilical hernia; Vitamin D deficiency; and Wegener's granulomatosis with renal involvement ([place]).  PSH:    Past Surgical History:  Procedure Laterality Date  . CARDIAC CATHETERIZATION    . CHOLECYSTECTOMY    . COLONOSCOPY N/A 06/03/2015   Procedure: COLONOSCOPY;  Surgeon: Jesus Lame, MD;  Location: [place];  Service: Gastroenterology;  Laterality: N/A;  . CORONARY ARTERY BYPASS GRAFT  2004   CABG x 3  . EYE SURGERY    . HERNIA REPAIR     x 2  . HIP SURGERY     right  hip replacement  . INTRAOCULAR LENS INSERTION    . KIDNEY SURGERY     cancer  . NOSE SURGERY    . POLYPECTOMY  06/03/2015   Procedure: POLYPECTOMY;  Surgeon: Jesus Lame, MD;  Location: [facility];  Service:  Gastroenterology;;  . PROSTATE SURGERY      Current Outpatient Prescriptions  Medication Sig Dispense Refill  . acetaminophen (TYLENOL) 325 MG tablet Take 650 mg by mouth 3 (three) times daily as needed for moderate pain.    Marland Kitchen atorvastatin (LIPITOR) 40 MG tablet Take 1 tablet (40 mg total) by mouth daily. 90 tablet 4  . cholecalciferol (VITAMIN D) 1000 UNITS tablet Take 2,000 Units by mouth at bedtime.     . clonazePAM (KLONOPIN) 1 MG tablet Take 1 mg by mouth 2 (two) times daily.    . fluticasone (FLONASE) 50 MCG/ACT nasal spray Place 2 sprays into both nostrils at bedtime.     . furosemide (LASIX) 20 MG tablet Take 1 tablet (20 mg total) by mouth 2 (two) times daily as needed for fluid or edema. 60 tablet 6  . losartan (COZAAR) 50 MG tablet Take 25 mg by mouth at bedtime.     . metoprolol succinate (TOPROL-XL) 25 MG 24 hr tablet Take 1 tablet (25 mg total) by mouth daily. 30 tablet 2  . niacin (NIASPAN) 500 MG CR tablet Take 500 mg by mouth daily.    . pantoprazole (PROTONIX) 40 MG tablet Take 40 mg by mouth daily.    . tamsulosin (FLOMAX) 0.4 MG CAPS capsule Take 0.4 mg by mouth daily.    Marland Kitchen warfarin (COUMADIN) 5 MG tablet Take as directed by Coumadin Clinic 100 tablet 1   No current facility-administered medications for this visit.      Allergies:   Review of patient's allergies indicates no known allergies.   Social History:  The patient  reports that he quit smoking about 41 years ago. His smoking use included Cigarettes. He has a 25.00 pack-year smoking history. He has never used smokeless tobacco. He reports that he drinks about 1.2 oz of alcohol per week . He reports that he does not use drugs.   Family History:   family history includes Heart disease in his brother, mother, and sister.    Review of Systems: Review of Systems  Constitutional: Negative.   Cardiovascular: Negative.   Gastrointestinal: Negative.   Musculoskeletal: Positive for back pain and joint pain.   Neurological: Negative.   All other systems reviewed and are negative.    PHYSICAL EXAM: VS:  BP 138/84 (BP Location: Left Arm, Patient Position: Sitting, Cuff Size: Normal)   Pulse 77   Ht 5\' 7"  (1.702 m)   Wt 221 lb 12 oz (100.6 kg)   BMI 34.73 kg/m  , BMI Body mass index is 34.73 kg/m. GEN: Well nourished, well developed, in no acute distress  HEENT: normal  Neck: no JVD, carotid bruits, or masses Cardiac: Irregularly irregular, no murmurs, rubs, or gallops,no edema  Respiratory:  clear to auscultation bilaterally, normal work of breathing GI: soft, nontender, nondistended, + BS MS: no deformity or atrophy,  unable to perform full range of motion Skin: warm and dry, no rash Neuro:  Strength was not fully tested secondary to significant hip pain, back pain Psych: euthymic mood, full affect    Recent Labs: 06/06/2016: B Natriuretic Peptide 98.0; BUN 19; Creatinine, Ser 0.96; Hemoglobin 13.5; Magnesium 1.9; Platelets 208; Potassium 3.8; Sodium 139; TSH 1.467    Lipid  Panel No results found for: CHOL, HDL, LDLCALC, TRIG    Wt Readings from Last 3 Encounters:  10/02/16 221 lb 12 oz (100.6 kg)  07/21/16 215 lb 4 oz (97.6 kg)  06/08/16 213 lb 4 oz (96.7 kg)       ASSESSMENT AND PLAN:  Coronary artery disease involving native coronary artery of native heart without angina pectoris - Plan: EKG 12-Lead, ECHOCARDIOGRAM COMPLETE Echocardiogram with anterior wall hypokinesis, old MI Stress test showing no ischemia  Persistent atrial fibrillation ([place]) - Plan: ECHOCARDIOGRAM COMPLETE Long discussion with him concerning anticoagulation He is unable to afford eliquis  on Coumadin but reports having itching We will change to xarelto once he has a pharmaceutical plan High risk of recurrent atrial fibrillation given his cardiomyopathy, obesity. No plan at this time to try to restore normal sinus rhythm as he is relatively asymptomatic  Secondary hypertension,  unspecified Blood pressure is well controlled on today's visit. No medication changes  Hyperlipidemia Encouraged him to stay on his Lipitor  S/P CABG x 3 Stress test as above, no ischemia  Shortness of breath Improved symptoms with rate control and Lasix    Total encounter time more than 25 minutes  Greater than 50% was spent in counseling and coordination of care with the patient   Disposition:   F/U  6 months   Orders Placed This Encounter  Procedures  . EKG 12-Lead     Signed, Esmond Plants, M.D., Ph.D. 10/02/2016  [place], [place]

## 2016-10-03 ENCOUNTER — Telehealth: Payer: Self-pay | Admitting: Cardiovascular Disease

## 2016-10-03 NOTE — Telephone Encounter (Signed)
Spoke w/ pt's wife.  Advised her that Dr. Rockey Situ is out of the office sick today & I will be unable to get an answer from him today.  She stated that she will make pt aware and call back w/ any other questions or concerns.

## 2016-10-03 NOTE — Telephone Encounter (Signed)
Pt calling stating he is going to be seeing a back doctor and for some pain.  He is wanting to know if he is okay to get that shot for that. Please advise  The appointment is today he states.

## 2016-10-05 ENCOUNTER — Ambulatory Visit (INDEPENDENT_AMBULATORY_CARE_PROVIDER_SITE_OTHER): Payer: Medicare Other | Admitting: Physical Medicine and Rehabilitation

## 2016-10-10 ENCOUNTER — Ambulatory Visit (INDEPENDENT_AMBULATORY_CARE_PROVIDER_SITE_OTHER): Payer: Medicare Other | Admitting: Orthopedic Surgery

## 2016-10-10 ENCOUNTER — Encounter (INDEPENDENT_AMBULATORY_CARE_PROVIDER_SITE_OTHER): Payer: Self-pay | Admitting: Orthopedic Surgery

## 2016-10-10 VITALS — BP 138/78 | HR 72 | Resp 12 | Ht 68.0 in | Wt 224.0 lb

## 2016-10-10 DIAGNOSIS — I255 Ischemic cardiomyopathy: Secondary | ICD-10-CM

## 2016-10-10 DIAGNOSIS — M25552 Pain in left hip: Secondary | ICD-10-CM

## 2016-10-10 DIAGNOSIS — M87052 Idiopathic aseptic necrosis of left femur: Secondary | ICD-10-CM

## 2016-10-10 NOTE — Progress Notes (Deleted)
Office Visit Note   Patient: Jesus Hendrix           Date of Birth: 23-Feb-1946           MRN: [id number] Visit Date: 10/10/2016              Requested by: Lenard Simmer, MD [address] [place], [place] [address] PCP: Lenard Simmer, MD   Assessment & Plan: Visit Diagnoses: No diagnosis found.  Plan: ***  Follow-Up Instructions: No Follow-up on file.   Orders:  No orders of the defined types were placed in this encounter.  No orders of the defined types were placed in this encounter.     Procedures: No procedures performed   Clinical Data: No additional findings.   Subjective: Chief Complaint  Patient presents with  . Left Hip - Pain, Weakness    Waiting for clearance from all his doctors for L THA. Pt fell onto concrete 10/09/16 when R leg gave way. He had a very hard time getting up, needed 2 extra people. He is very bruised at his R hip, R knee and R hand.    HPI  Review of Systems   Objective: Vital Signs: BP 138/78   Pulse 72   Resp 12   Ht 5\' 8"  (1.727 m)   Wt 224 lb (101.6 kg)   BMI 34.06 kg/m   Physical Exam  Ortho Exam  Specialty Comments:  No specialty comments available.  Imaging: No results found.   PMFS History: Patient Active Problem List   Diagnosis Date Noted  . Long term (current) use of anticoagulants [Z79.01] 08/09/2016  . New onset a-fib ([place]) 06/06/2016  . Anxiety 06/06/2016  . Hip pain 06/06/2016  . Shortness of breath   . Preop cardiovascular exam 11/15/2015  . Special screening for malignant neoplasms, colon   . Benign neoplasm of sigmoid colon   . Rectal polyp   . Mild aortic valve stenosis 07/13/2014  . Carotid stenosis 07/13/2014  . Near syncope 04/15/2014  . Murmur 04/15/2014  . Bruit 04/15/2014  . Bradycardia 04/15/2014  . LBBB (left bundle branch block) 04/15/2014  . First degree AV block 04/15/2014  . Wegener's granulomatosis ([place]) 05/23/2013  . CAD (coronary artery disease) 01/08/2012  .  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 Hyperlipidemia 01/08/2012  . HTN (hypertension) 01/08/2012   Past Medical History:  Diagnosis Date  . Anxiety   . Aortic stenosis    a. mild on echo 04/2014  . Atrial fibrillation ([place]) 06/06/2016   a. newly diagnosed 06/06/2016; b. CHADS2VASc => 5 (CHF, HTN, age x 1, DM, vascular disease)  . Choledocholithiasis   . Chronic kidney disease   . Chronic systolic CHF (congestive heart failure) ([place])    a. echo 04/2014: EF 45-50%, nl LV dia fxn, mild AS, LA mildly dilated, PASP nl  . COPD (chronic obstructive pulmonary disease) ([place])    Not on home oxygen  . Coronary artery disease    a. status post CABG in 2007; b. nuc stress test 2014: small region of mild ischemia in the inferior territory, EF 61%, no ischemia, scan similar to prior scan in 2009  . DM (diabetes mellitus) ([place])   . Exogenous obesity   . GERD (gastroesophageal reflux disease)   . Gout   . Hiatal hernia   . Hip pain, chronic 08/03/2016   Waiting to have hip replacement pending NM stress test results per patient.  . History of colonic diverticulitis   .  History of renal cell carcinoma    a. s/p laser ablation   . Hyperlipidemia   . Hypertension   . Near syncope    a. 3 separate events in 2017  . Nephrolithiasis   . Panic attacks   . Spinal stenosis   . Temporomandibular joint pain dysfunction syndrome   . Umbilical hernia   . Vitamin D deficiency   . 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 Heart disease Mother   . Heart disease Sister   . Heart disease Brother     Past Surgical History:  Procedure Laterality Date  . CARDIAC CATHETERIZATION    . CHOLECYSTECTOMY    . COLONOSCOPY N/A 06/03/2015   Procedure: COLONOSCOPY;  Surgeon: Lucilla Lame, MD;  Location: [facility];  Service: Gastroenterology;  Laterality: N/A;  . CORONARY ARTERY BYPASS GRAFT  2004   CABG x 3  . EYE SURGERY    . HERNIA REPAIR     x 2    . HIP SURGERY     right hip replacement  . INTRAOCULAR LENS INSERTION    . KIDNEY SURGERY     cancer  . NOSE SURGERY    . POLYPECTOMY  06/03/2015   Procedure: POLYPECTOMY;  Surgeon: Lucilla Lame, MD;  Location: [facility];  Service: Gastroenterology;;  . PROSTATE SURGERY     Social History   Occupational History  . Not on file.   Social History Main Topics  . Smoking status: Former Smoker    Packs/day: 1.00    Years: 25.00    Types: Cigarettes    Quit date: 12/29/1974  . Smokeless tobacco: Never Used  . Alcohol use 1.2 oz/week    2 Cans of beer per week     Comment: social  . Drug use: No  . Sexual activity: Not on file

## 2016-10-10 NOTE — Progress Notes (Signed)
Office Visit Note   Patient: Jesus Hendrix           Date of Birth: [birth year]           MRN: [id number] Visit Date: 10/10/2016              Requested by: Lenard Simmer, MD [address] [place], [place] [address] PCP: Lenard Simmer, MD   Assessment & Plan: Visit Diagnoses:  1. Avascular necrosis of femoral head, left (HCC)   2. Pain of left hip joint     Plan: At this time we have not obtained all of his clearances and evaluations. We have had a long discussion about his clearances and he will go to each of the physicians and obtain these. I have given him our fax number.  Follow-Up Instructions: Return call when he obtains all of his clearances.   Orders:  No orders of the defined types were placed in this encounter.  No orders of the defined types were placed in this encounter.     Procedures: No procedures performed   Clinical Data: No additional findings.   Subjective: Chief Complaint  Patient presents with  . Left Hip - Pain, Weakness    Waiting for clearance from all his doctors for L THA. Pt fell onto concrete 10/09/16 when R leg gave way. He had a very hard time getting up, needed 2 extra people. He is very bruised at his R hip, R knee and R hand.    HPI  Jesus Hendrix is a 70 year old white male who is seen today for evaluation of his left groin pain which appeared previously diagnosed as avascular necrosis. He has had a previous right total hip replacement for avascular necrosis. He now has AVN of his left hip which is progressively worsening with pain and discomfort. He has nighttime pain as well as rest pain. He has difficulty with any activities of daily living. He also has significant lumbar spine disease and has been treated previously with Dr. Ernestina Patches with [facility]. Comes in today requesting an office visit to determine what clearances need to be obtained or his surgery.  Review of Systems are unchanged since his previous visit of  04/04/2016   Objective: Vital Signs: BP 138/78   Pulse 72   Resp 12   Ht 5\' 8"  (1.727 m)   Wt 224 lb (101.6 kg)   BMI 34.06 kg/m   Physical Exam  Right Hip Exam  Right hip exam is normal.   Tenderness  The patient is experiencing no tenderness.      Left Hip Exam   Tenderness  The patient is experiencing no tenderness.     Range of Motion  Extension:  -10 abnormal  Flexion:  90 abnormal  Internal Rotation: 0 abnormal  External Rotation:  10 abnormal  Abduction: abnormal  Adduction: abnormal   Muscle Strength  Abduction: 3/5  Adduction: 3/5  Flexion: 3/5   Other  Sensation: normal      Specialty Comments:  No specialty comments available.  Imaging: No results found.   PMFS History: Patient Active Problem List   Diagnosis Date Noted  . Long term (current) use of anticoagulants [Z79.01] 08/09/2016  . New onset a-fib ([place]) 06/06/2016  . Anxiety 06/06/2016  . Hip pain 06/06/2016  . Shortness of breath   . Preop cardiovascular exam 11/15/2015  . Special screening for malignant neoplasms, colon   . Benign neoplasm of sigmoid colon   . Rectal polyp   .  Mild aortic valve stenosis 07/13/2014  . Carotid stenosis 07/13/2014  . Near syncope 04/15/2014  . Murmur 04/15/2014  . Bruit 04/15/2014  . Bradycardia 04/15/2014  . LBBB (left bundle branch block) 04/15/2014  . First degree AV block 04/15/2014  . Wegener's granulomatosis ([place]) 05/23/2013  . CAD (coronary artery disease) 01/08/2012  . 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 Hyperlipidemia 01/08/2012  . HTN (hypertension) 01/08/2012   Past Medical History:  Diagnosis Date  . Anxiety   . Aortic stenosis    a. mild on echo 04/2014  . Atrial fibrillation ([place]) 06/06/2016   a. newly diagnosed 06/06/2016; b. CHADS2VASc => 5 (CHF, HTN, age x 1, DM, vascular disease)  . Choledocholithiasis   . Chronic kidney disease   . Chronic systolic CHF (congestive heart failure) ([place])    a. echo 04/2014: EF 45-50%, nl LV  dia fxn, mild AS, LA mildly dilated, PASP nl  . COPD (chronic obstructive pulmonary disease) ([place])    Not on home oxygen  . Coronary artery disease    a. status post CABG in 2007; b. nuc stress test 2014: small region of mild ischemia in the inferior territory, EF 61%, no ischemia, scan similar to prior scan in 2009  . DM (diabetes mellitus) ([place])   . Exogenous obesity   . GERD (gastroesophageal reflux disease)   . Gout   . Hiatal hernia   . Hip pain, chronic 08/03/2016   Waiting to have hip replacement pending NM stress test results per patient.  . History of colonic diverticulitis   . History of renal cell carcinoma    a. s/p laser ablation   . Hyperlipidemia   . Hypertension   . Near syncope    a. 3 separate events in 2017  . Nephrolithiasis   . Panic attacks   . Spinal stenosis   . Temporomandibular joint pain dysfunction syndrome   . Umbilical hernia   . Vitamin D deficiency   . 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 Heart disease Mother   . Heart disease Sister   . Heart disease Brother     Past Surgical History:  Procedure Laterality Date  . CARDIAC CATHETERIZATION    . CHOLECYSTECTOMY    . COLONOSCOPY N/A 06/03/2015   Procedure: COLONOSCOPY;  Surgeon: Lucilla Lame, MD;  Location: [facility];  Service: Gastroenterology;  Laterality: N/A;  . CORONARY ARTERY BYPASS GRAFT  2004   CABG x 3  . EYE SURGERY    . HERNIA REPAIR     x 2  . HIP SURGERY     right hip replacement  . INTRAOCULAR LENS INSERTION    . KIDNEY SURGERY     cancer  . NOSE SURGERY    . POLYPECTOMY  06/03/2015   Procedure: POLYPECTOMY;  Surgeon: Lucilla Lame, MD;  Location: [facility];  Service: Gastroenterology;;  . PROSTATE SURGERY     Social History   Occupational History  . Not on file.   Social History Main Topics  . Smoking status: Former Smoker    Packs/day: 1.00    Years: 25.00    Types: Cigarettes     Quit date: 12/29/1974  . Smokeless tobacco: Never Used  . Alcohol use 1.2 oz/week    2 Cans of beer per week     Comment: social  . Drug use: No  . Sexual activity: Not on file

## 2016-10-12 ENCOUNTER — Ambulatory Visit (INDEPENDENT_AMBULATORY_CARE_PROVIDER_SITE_OTHER): Payer: Medicare Other | Admitting: Physical Medicine and Rehabilitation

## 2016-10-16 ENCOUNTER — Telehealth: Payer: Self-pay | Admitting: Cardiovascular Disease

## 2016-10-16 NOTE — Telephone Encounter (Signed)
Patient dropped off DOT clearance form .  Please call when ready for pick up.  Placed in Nurse box.patient  Patient says cdl will expire on 10/24/16.

## 2016-10-16 NOTE — Telephone Encounter (Signed)
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 Donivan Scull signature to clear pt to drive.  The wording on the form was contradictory to itself, so Dr. Rockey Situ did not feel comfortable signing it. Advised pt of this.  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 Advised pt that he was provided w/ a letter on 09/21/16 from Dr. 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 Advised him to check for this clearance letter and if he cannot find it, I will provide him w/ a new copy. He is appreciative and will call back tomorrow if he needs this.

## 2016-10-18 ENCOUNTER — Ambulatory Visit (INDEPENDENT_AMBULATORY_CARE_PROVIDER_SITE_OTHER): Payer: Medicare Other

## 2016-10-18 DIAGNOSIS — I4891 Unspecified atrial fibrillation: Secondary | ICD-10-CM

## 2016-10-18 DIAGNOSIS — Z7901 Long term (current) use of anticoagulants: Secondary | ICD-10-CM | POA: Diagnosis not present

## 2016-10-18 LAB — POCT INR: INR: 2

## 2016-10-18 MED ORDER — WARFARIN SODIUM 5 MG PO TABS: ORAL_TABLET | ORAL | 1 refills | Status: DC

## 2016-10-18 NOTE — Telephone Encounter (Signed)
Patient came by office to pick up copy of this clearance form.  Patient says he has gone to another facility for clearance and they want to look at this form.  Please call when he can pick up form.

## 2016-10-19 NOTE — Telephone Encounter (Signed)
Form is at the front desk for pt to p/u at his convenience.

## 2016-10-20 NOTE — Telephone Encounter (Signed)
Please see previous phone note.  

## 2016-10-20 NOTE — Telephone Encounter (Signed)
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 Rockey Situ, please call.

## 2016-10-23 ENCOUNTER — Ambulatory Visit: Payer: No Typology Code available for payment source | Admitting: Cardiovascular Disease

## 2016-11-01 ENCOUNTER — Ambulatory Visit (INDEPENDENT_AMBULATORY_CARE_PROVIDER_SITE_OTHER): Payer: Medicare Other

## 2016-11-01 DIAGNOSIS — Z7901 Long term (current) use of anticoagulants: Secondary | ICD-10-CM

## 2016-11-01 DIAGNOSIS — I4891 Unspecified atrial fibrillation: Secondary | ICD-10-CM | POA: Diagnosis not present

## 2016-11-01 DIAGNOSIS — I255 Ischemic cardiomyopathy: Secondary | ICD-10-CM

## 2016-11-01 LAB — POCT INR: INR: 2.4

## 2016-11-15 ENCOUNTER — Telehealth: Payer: Self-pay

## 2016-11-15 NOTE — Telephone Encounter (Signed)
Will notify pt at next Coumadin Clinic visit.

## 2016-11-15 NOTE — Telephone Encounter (Signed)
-----   Message from Minna Merritts, MD sent at 11/12/2016 10:40 PM EST ----- Acceptable risk for surgery and to come off warfarin May be a good to switch back to NOAC after the surgery He indicated he might want to make the switch thx TG  ----- Message ----- From: Brynda Peon, RN Sent: 11/01/2016   3:08 PM To: Minna Merritts, MD  Pt is scheduled for hip surgery on 12/19/16 by Dr Joni Fears.  Pt needs to be cleared for surgery and to hold Coumadin 5 days prior to procedure.  Please advise.  Thanks

## 2016-11-22 ENCOUNTER — Ambulatory Visit (INDEPENDENT_AMBULATORY_CARE_PROVIDER_SITE_OTHER): Payer: Medicare Other

## 2016-11-22 DIAGNOSIS — Z7901 Long term (current) use of anticoagulants: Secondary | ICD-10-CM

## 2016-11-22 DIAGNOSIS — I4891 Unspecified atrial fibrillation: Secondary | ICD-10-CM | POA: Diagnosis not present

## 2016-11-22 DIAGNOSIS — I255 Ischemic cardiomyopathy: Secondary | ICD-10-CM

## 2016-11-22 LAB — POCT INR: INR: 2.3

## 2016-11-29 ENCOUNTER — Telehealth: Payer: Self-pay | Admitting: Cardiovascular Disease

## 2016-11-29 NOTE — Telephone Encounter (Signed)
Patient states that both of his legs are hurting really bad and have been the past month. He states that he also developed a rash on right leg and that it itches something terrible. He reports that a friend was on coumadin and was having similar leg pains. Checked with [name] RN with coumadin clinic and states that coumadin does not cause these symptoms. Patient had coumadin checked last week and he was therapeutic. Instructed patient to check with his primary care provider regarding these symptoms. He verbalized understanding and had no further questions.

## 2016-11-29 NOTE — Telephone Encounter (Signed)
Pt calling stating he is having some pains in his legs Been going on for a while now but its been getting worst now.  States it is so bad he is breaking out, thinks it may have to do with his warfarin Going on about a month or so Would like to know what can he do Please advise.

## 2016-12-06 ENCOUNTER — Ambulatory Visit (INDEPENDENT_AMBULATORY_CARE_PROVIDER_SITE_OTHER): Payer: Medicare Other | Admitting: Orthopedic Surgery

## 2016-12-06 ENCOUNTER — Encounter (INDEPENDENT_AMBULATORY_CARE_PROVIDER_SITE_OTHER): Payer: Self-pay | Admitting: Orthopedic Surgery

## 2016-12-06 VITALS — BP 120/61 | HR 76 | Temp 98.3°F | Resp 14 | Ht 67.0 in | Wt 216.0 lb

## 2016-12-06 DIAGNOSIS — M25552 Pain in left hip: Secondary | ICD-10-CM

## 2016-12-06 DIAGNOSIS — I255 Ischemic cardiomyopathy: Secondary | ICD-10-CM | POA: Diagnosis not present

## 2016-12-06 DIAGNOSIS — M8700 Idiopathic aseptic necrosis of unspecified bone: Secondary | ICD-10-CM | POA: Diagnosis not present

## 2016-12-06 NOTE — H&P (Addendum)
Patient: Jesus Hendrix                                       Date of Birth: 1946-05-31                                                     MRN: [id number] Visit Date: 12/06/2016                                                                     Requested by: Lenard Simmer, MD [address] [place], [place] [address] PCP: Lenard Simmer, MD   Assessment & Plan: Visit Diagnoses:  1. AVN (avascular necrosis of bone) (HCC)   2. Pain of left hip joint      Plan: At this time we feel that he is a candidate for a left total hip arthroplasty. He will have to stop his Coumadin preoperatively and will place him back on postop. She'll consider remote telemetry for him also.  Follow-Up Instructions: Return if symptoms worsen or fail to improve.   Orders:  No orders of the defined types were placed in this encounter.  No orders of the defined types were placed in this encounter.     Procedures: No procedures performed   Clinical Data: No additional findings.   Subjective: Chief complaint: Painful left hip   Jesus Hendrix is a 70 year old white male who is seen today for evaluation of his left groin pain which appeared previously diagnosed as avascular necrosis. He has had a previous right total hip replacement for avascular necrosis. He now has AVN of his left hip which is progressively worsening with pain and discomfort. He has nighttime pain as well as rest pain. He has difficulty with any activities of daily living. He also has significant lumbar spine disease and has been treated previously with Dr. Ernestina Patches with [facility]. However approximate 8 months ago he started having worsening symptoms.He still continues to work, doing Psychologist, forensic work, and he states that certainly having to jump over ditches and that, he has had problems with severe pain in the groin.  He has had a previous right total hip replacement for AVN.  He has also had multiple problems where he was taking  a drug called CellCept, which caused a renal carcinoma and he had to have a left nephrectomy.  He comes in today, though, complaining of pain more in the left groin and a limp. It has not affected his ability to perform activities of daily living as well as his job. Nighttime pain. Pain with every step. Antalgic limp is noted by him. He cannot take this pain anymore. Radiographs from 04/04/2016 revealed what appears to be AVN with a fragmentation in the fovea noted on the pelvis. Seen today for evaluation.   Review of Systems  Constitutional: Negative.   HENT: Negative.   Respiratory: Negative.   Cardiovascular:       History of coronary artery disease with stenosis and stenting. On prophylactic Coumadin..  Gastrointestinal: Negative.  Genitourinary:       History of left nephrectomy for carcinoma  Skin: Negative.   Neurological: Negative.   Hematological: Negative.   Psychiatric/Behavioral: Negative.      Objective: Vital Signs: BP 120/61 (BP Location: Left Arm, Patient Position: Sitting, Cuff Size: Normal)   Pulse 76   Temp 98.3 F (36.8 C)   Resp 14   Ht 5\' 7"  (1.702 m)   Wt 216 lb (98 kg)   BMI 33.83 kg/m   Physical Exam  Constitutional: He is oriented to person, place, and time. He appears well-developed and well-nourished.  HENT:  Head: Normocephalic and atraumatic.  Eyes: Conjunctivae and EOM are normal. Pupils are equal, round, and reactive to light.  Neck:  No carotid bruits  Cardiovascular: Normal rate and intact distal pulses.   Murmur (grade 3 systolic murmur left and right lower sternal border) heard. Irregular heart beat with distant heart sounds  Pulmonary/Chest: Effort normal.  Abdominal: Soft. Bowel sounds are normal. A hernia (midepigastric) is present.  Neurological: He is alert and oriented to person, place, and time.  Skin: Skin is warm and dry.  Psychiatric: He has a normal mood and affect. His behavior is normal. Judgment and thought content  normal.    Right Hip Exam  Right hip exam is normal.    Left Hip Exam   Range of Motion  Internal Rotation: abnormal  External Rotation: abnormal  Abduction: 15  Adduction: 5   Other  Sensation: normal Pulse: present  Comments:  Very limited internal and external rotation of the hip. Knee taken into flexion he externally rotates about the 20. He cannot internally rotate him at all     Specialty Comments:  No specialty comments available.  Imaging: No results found.  Current Facility-Administered Medications  Medication Dose Route Frequency Provider Last Rate Last Dose  . ceFAZolin (ANCEF) IVPB 2g/100 mL premix  2 g Intravenous On Call to [place], RPH      . tranexamic acid (CYKLOKAPRON) 2,000 mg in sodium chloride 0.9 % 50 mL Topical Application  1,610 mg Topical To OR Theone Murdoch Hammons, RPH       Current Outpatient Prescriptions  Medication Sig Dispense Refill  . acetaminophen (TYLENOL) 325 MG tablet Take 650 mg by mouth 3 (three) times daily as needed for moderate pain.    Marland Kitchen atorvastatin (LIPITOR) 40 MG tablet Take 1 tablet (40 mg total) by mouth daily. 90 tablet 4  . Cholecalciferol (VITAMIN D) 2000 units CAPS Take 2,000 Units by mouth daily.    . clonazePAM (KLONOPIN) 1 MG tablet Take 1 mg by mouth 2 (two) times daily.    . fluticasone (FLONASE) 50 MCG/ACT nasal spray Place 2 sprays into both nostrils at bedtime.     . furosemide (LASIX) 20 MG tablet Take 1 tablet (20 mg total) by mouth 2 (two) times daily as needed for fluid or edema. (Patient taking differently: Take 40 mg by mouth daily. ) 60 tablet 6  . losartan (COZAAR) 50 MG tablet Take 25 mg by mouth at bedtime.     . metoprolol succinate (TOPROL-XL) 25 MG 24 hr tablet Take 1 tablet (25 mg total) by mouth daily. 30 tablet 2  . niacin (NIASPAN) 500 MG CR tablet Take 500 mg by mouth daily.    . pantoprazole (PROTONIX) 40 MG tablet Take 40 mg by mouth daily.    . tamsulosin (FLOMAX)  0.4 MG CAPS capsule Take 0.4 mg by mouth at bedtime.     Marland Kitchen  warfarin (COUMADIN) 5 MG tablet Take as directed by Coumadin Clinic (Patient taking differently: Take 5 mg by mouth as directed. On Sunday, Wednesday & Thursday take 1.5 tablet (7.5) then on all days take 1 tablet (5 mg)) 120 tablet 1     PMFS History:     Patient Active Problem List   Diagnosis Date Noted  . Long term (current) use of anticoagulants [Z79.01] 08/09/2016  . New onset a-fib ([place]) 06/06/2016  . Anxiety 06/06/2016  . Hip pain 06/06/2016  . Shortness of breath   . Preop cardiovascular exam 11/15/2015  . Special screening for malignant neoplasms, colon   . Benign neoplasm of sigmoid colon   . Rectal polyp   . Mild aortic valve stenosis 07/13/2014  . Carotid stenosis 07/13/2014  . Near syncope 04/15/2014  . Murmur 04/15/2014  . Bruit 04/15/2014  . Bradycardia 04/15/2014  . LBBB (left bundle branch block) 04/15/2014  . First degree AV block 04/15/2014  . Wegener's granulomatosis ([place]) 05/23/2013  . CAD (coronary artery disease) 01/08/2012  . 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 Hyperlipidemia 01/08/2012  . HTN (hypertension) 01/08/2012       Past Medical History:  Diagnosis Date  . Anxiety   . Aortic stenosis    a. mild on echo 04/2014  . Atrial fibrillation ([place]) 06/06/2016   a. newly diagnosed 06/06/2016; b. CHADS2VASc => 5 (CHF, HTN, age x 1, DM, vascular disease)  . Choledocholithiasis   . Chronic kidney disease   . Chronic systolic CHF (congestive heart failure) ([place])    a. echo 04/2014: EF 45-50%, nl LV dia fxn, mild AS, LA mildly dilated, PASP nl  . COPD (chronic obstructive pulmonary disease) ([place])    Not on home oxygen  . Coronary artery disease    a. status post CABG in 2007; b. nuc stress test 2014: small region of mild ischemia in the inferior territory, EF 61%, no ischemia, scan similar to prior scan in 2009  . DM (diabetes mellitus) ([place])   . Exogenous obesity   . GERD  (gastroesophageal reflux disease)   . Gout   . Hiatal hernia   . Hip pain, chronic 08/03/2016   Waiting to have hip replacement pending NM stress test results per patient.  . History of colonic diverticulitis   . History of renal cell carcinoma    a. s/p laser ablation   . Hyperlipidemia   . Hypertension   . Near syncope    a. 3 separate events in 2017  . Nephrolithiasis   . Panic attacks   . Spinal stenosis   . Temporomandibular joint pain dysfunction syndrome   . Umbilical hernia   . Vitamin D deficiency   . 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 Heart disease Mother   . Heart disease Sister   . Heart disease Brother          Past Surgical History:  Procedure Laterality Date  . CARDIAC CATHETERIZATION    . CHOLECYSTECTOMY    . COLONOSCOPY N/A 06/03/2015   Procedure: COLONOSCOPY;  Surgeon: Lucilla Lame, MD;  Location: [facility];  Service: Gastroenterology;  Laterality: N/A;  . CORONARY ARTERY BYPASS GRAFT  2004   CABG x 3  . EYE SURGERY    . HERNIA REPAIR     x 2  . HIP SURGERY     right hip replacement  . INTRAOCULAR  LENS INSERTION    . KIDNEY SURGERY     cancer  . NOSE SURGERY    . POLYPECTOMY  06/03/2015   Procedure: POLYPECTOMY;  Surgeon: Lucilla Lame, MD;  Location: [facility];  Service: Gastroenterology;;  . PROSTATE SURGERY     Social History      Occupational History  . Not on file.         Social History Main Topics  . Smoking status: Former Smoker    Packs/day: 1.00    Years: 25.00    Types: Cigarettes    Quit date: 12/29/1974  . Smokeless tobacco: Never Used  . Alcohol use 1.2 oz/week    2 Cans of beer per week     Comment: social  . Drug use: No  . Sexual activity: Not on file    Mike Craze. [place], [place] [phone]  12/19/2016 7:20 AM

## 2016-12-06 NOTE — Progress Notes (Signed)
Office Visit Note   Patient: Jesus Hendrix           Date of Birth: 10-06-1946           MRN: [id number] Visit Date: 12/06/2016              Requested by: Lenard Simmer, MD [address] [place],  [address] PCP: Lenard Simmer, MD   Assessment & Plan: Visit Diagnoses:  1. AVN (avascular necrosis of bone) (HCC)   2. Pain of left hip joint      Plan: At this time we feel that he is a candidate for a left total hip arthroplasty. He will have to stop his Coumadin preoperatively and will place him back on postop. She'll consider remote telemetry for him also.  Follow-Up Instructions: Return if symptoms worsen or fail to improve.   Orders:  No orders of the defined types were placed in this encounter.  No orders of the defined types were placed in this encounter.     Procedures: No procedures performed   Clinical Data: No additional findings.   Subjective: Chief complaint: Painful left hip   Quandre is a 70 year old white male who is seen today for evaluation of his left groin pain which appeared previously diagnosed as avascular necrosis. He has had a previous right total hip replacement for avascular necrosis. He now has AVN of his left hip which is progressively worsening with pain and discomfort. He has nighttime pain as well as rest pain. He has difficulty with any activities of daily living. He also has significant lumbar spine disease and has been treated previously with Dr. Ernestina Patches with [facility]. However approximate 8 months ago he started having worsening symptoms.He still continues to work, doing Psychologist, forensic work, and he states that certainly having to jump over ditches and that, he has had problems with severe pain in the groin.  He has had a previous right total hip replacement for AVN.  He has also had multiple problems where he was taking a drug called CellCept, which caused a renal carcinoma and he had to have a left nephrectomy.  He comes in today, though,  complaining of pain more in the left groin and a limp. It has not affected his ability to perform activities of daily living as well as his job. Nighttime pain. Pain with every step. Antalgic limp is noted by him. He cannot take this pain anymore. Radiographs from 04/04/2016 revealed what appears to be AVN with a fragmentation in the fovea noted on the pelvis. Seen today for evaluation.    Review of Systems  Constitutional: Negative.   HENT: Negative.   Respiratory: Negative.   Cardiovascular:       History of coronary artery disease with stenosis and stenting. On prophylactic Coumadin..  Gastrointestinal: Negative.   Genitourinary:       History of left nephrectomy for carcinoma  Skin: Negative.   Neurological: Negative.   Hematological: Negative.   Psychiatric/Behavioral: Negative.      Objective: Vital Signs: BP 120/61 (BP Location: Left Arm, Patient Position: Sitting, Cuff Size: Normal)   Pulse 76   Temp 98.3 F (36.8 C)   Resp 14   Ht 5\' 7"  (1.702 m)   Wt 216 lb (98 kg)   BMI 33.83 kg/m   Physical Exam  Constitutional: He is oriented to person, place, and time. He appears well-developed and well-nourished.  HENT:  Head: Normocephalic and atraumatic.  Eyes: Conjunctivae and EOM are normal. Pupils are  equal, round, and reactive to light.  Neck:  No carotid bruits  Cardiovascular: Normal rate and intact distal pulses.   Murmur (grade 3 systolic murmur left and right lower sternal border) heard. Irregular heart beat with distant heart sounds  Pulmonary/Chest: Effort normal.  Abdominal: Soft. Bowel sounds are normal. A hernia (midepigastric) is present.  Neurological: He is alert and oriented to person, place, and time.  Skin: Skin is warm and dry.  Psychiatric: He has a normal mood and affect. His behavior is normal. Judgment and thought content normal.    Right Hip Exam  Right hip exam is normal.    Left Hip Exam   Range of Motion  Internal Rotation: abnormal    External Rotation: abnormal  Abduction: 15  Adduction: 5   Other  Sensation: normal Pulse: present  Comments:  Very limited internal and external rotation of the hip. Knee taken into flexion he externally rotates about the 20. He cannot internally rotate him all      Specialty Comments:  No specialty comments available.  Imaging: No results found.   PMFS History: Patient Active Problem List   Diagnosis Date Noted  . Long term (current) use of anticoagulants [Z79.01] 08/09/2016  . New onset a-fib ([place]) 06/06/2016  . Anxiety 06/06/2016  . Hip pain 06/06/2016  . Shortness of breath   . Preop cardiovascular exam 11/15/2015  . Special screening for malignant neoplasms, colon   . Benign neoplasm of sigmoid colon   . Rectal polyp   . Mild aortic valve stenosis 07/13/2014  . Carotid stenosis 07/13/2014  . Near syncope 04/15/2014  . Murmur 04/15/2014  . Bruit 04/15/2014  . Bradycardia 04/15/2014  . LBBB (left bundle branch block) 04/15/2014  . First degree AV block 04/15/2014  . 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 CAD (coronary artery disease) 01/08/2012  . 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 Hyperlipidemia 01/08/2012  . HTN (hypertension) 01/08/2012   Past Medical History:  Diagnosis Date  . Anxiety   . Aortic stenosis    a. mild on echo 04/2014  . Atrial fibrillation ([place]) 06/06/2016   a. newly diagnosed 06/06/2016; b. CHADS2VASc => 5 (CHF, HTN, age x 1, DM, vascular disease)  . Choledocholithiasis   . Chronic kidney disease   . Chronic systolic CHF (congestive heart failure) ([place])    a. echo 04/2014: EF 45-50%, nl LV dia fxn, mild AS, LA mildly dilated, PASP nl  . COPD (chronic obstructive pulmonary disease) ([place])    Not on home oxygen  . Coronary artery disease    a. status post CABG in 2007; b. nuc stress test 2014: small region of mild ischemia in the inferior territory, EF 61%, no ischemia, scan similar to prior scan in 2009  . DM (diabetes mellitus) ([place])    . Exogenous obesity   . GERD (gastroesophageal reflux disease)   . Gout   . Hiatal hernia   . Hip pain, chronic 08/03/2016   Waiting to have hip replacement pending NM stress test results per patient.  . History of colonic diverticulitis   . History of renal cell carcinoma    a. s/p laser ablation   . Hyperlipidemia   . Hypertension   . Near syncope    a. 3 separate events in 2017  . Nephrolithiasis   . Panic attacks   . Spinal stenosis   . Temporomandibular joint pain dysfunction syndrome   . Umbilical hernia   . Vitamin D deficiency   . Wegener's  granulomatosis with renal involvement ([place])     Family History  Problem Relation Age of Onset  . Heart disease Mother   . Heart disease Sister   . Heart disease Brother     Past Surgical History:  Procedure Laterality Date  . CARDIAC CATHETERIZATION    . CHOLECYSTECTOMY    . COLONOSCOPY N/A 06/03/2015   Procedure: COLONOSCOPY;  Surgeon: Lucilla Lame, MD;  Location: [facility];  Service: Gastroenterology;  Laterality: N/A;  . CORONARY ARTERY BYPASS GRAFT  2004   CABG x 3  . EYE SURGERY    . HERNIA REPAIR     x 2  . HIP SURGERY     right hip replacement  . INTRAOCULAR LENS INSERTION    . KIDNEY SURGERY     cancer  . NOSE SURGERY    . POLYPECTOMY  06/03/2015   Procedure: POLYPECTOMY;  Surgeon: Lucilla Lame, MD;  Location: [facility];  Service: Gastroenterology;;  . PROSTATE SURGERY     Social History   Occupational History  . Not on file.   Social History Main Topics  . Smoking status: Former Smoker    Packs/day: 1.00    Years: 25.00    Types: Cigarettes    Quit date: 12/29/1974  . Smokeless tobacco: Never Used  . Alcohol use 1.2 oz/week    2 Cans of beer per week     Comment: social  . Drug use: No  . Sexual activity: Not on file

## 2016-12-07 ENCOUNTER — Encounter (HOSPITAL_COMMUNITY)
Admission: RE | Admit: 2016-12-07 | Discharge: 2016-12-07 | Disposition: A | Discharge status: Home / Self Care | Payer: Medicare Other | Source: Ambulatory Visit | Attending: Orthopaedic Surgery | Admitting: Orthopaedic Surgery

## 2016-12-07 ENCOUNTER — Encounter (HOSPITAL_COMMUNITY): Payer: Self-pay

## 2016-12-07 DIAGNOSIS — K219 Gastro-esophageal reflux disease without esophagitis: Secondary | ICD-10-CM | POA: Diagnosis not present

## 2016-12-07 DIAGNOSIS — I10 Essential (primary) hypertension: Secondary | ICD-10-CM | POA: Insufficient documentation

## 2016-12-07 DIAGNOSIS — I2510 Atherosclerotic heart disease of native coronary artery without angina pectoris: Secondary | ICD-10-CM | POA: Insufficient documentation

## 2016-12-07 DIAGNOSIS — E785 Hyperlipidemia, unspecified: Secondary | ICD-10-CM | POA: Diagnosis not present

## 2016-12-07 DIAGNOSIS — Z951 Presence of aortocoronary bypass graft: Secondary | ICD-10-CM | POA: Diagnosis not present

## 2016-12-07 DIAGNOSIS — J449 Chronic obstructive pulmonary disease, unspecified: Secondary | ICD-10-CM | POA: Diagnosis not present

## 2016-12-07 DIAGNOSIS — Z01812 Encounter for preprocedural laboratory examination: Secondary | ICD-10-CM | POA: Diagnosis not present

## 2016-12-07 LAB — CBC WITH DIFFERENTIAL/PLATELET
Basophils Absolute: 0 10*3/uL (ref 0.0–0.1)
Basophils Relative: 1 %
Eosinophils Absolute: 0.5 10*3/uL (ref 0.0–0.7)
Eosinophils Relative: 8 %
HEMATOCRIT: 41.1 % (ref 39.0–52.0)
HEMOGLOBIN: 14.1 g/dL (ref 13.0–17.0)
LYMPHS ABS: 2 10*3/uL (ref 0.7–4.0)
LYMPHS PCT: 30 %
MCH: 31.5 pg (ref 26.0–34.0)
MCHC: 34.3 g/dL (ref 30.0–36.0)
MCV: 91.9 fL (ref 78.0–100.0)
MONOS PCT: 6 %
Monocytes Absolute: 0.4 10*3/uL (ref 0.1–1.0)
NEUTROS ABS: 3.6 10*3/uL (ref 1.7–7.7)
NEUTROS PCT: 55 %
Platelets: 288 10*3/uL (ref 150–400)
RBC: 4.47 MIL/uL (ref 4.22–5.81)
RDW: 13.9 % (ref 11.5–15.5)
WBC: 6.5 10*3/uL (ref 4.0–10.5)

## 2016-12-07 LAB — COMPREHENSIVE METABOLIC PANEL
ALK PHOS: 60 U/L (ref 38–126)
ALT: 27 U/L (ref 17–63)
ANION GAP: 7 (ref 5–15)
AST: 25 U/L (ref 15–41)
Albumin: 3.7 g/dL (ref 3.5–5.0)
BILIRUBIN TOTAL: 1.1 mg/dL (ref 0.3–1.2)
BUN: 31 mg/dL — ABNORMAL HIGH (ref 6–20)
CALCIUM: 9.3 mg/dL (ref 8.9–10.3)
CO2: 26 mmol/L (ref 22–32)
Chloride: 105 mmol/L (ref 101–111)
Creatinine, Ser: 1.6 mg/dL — ABNORMAL HIGH (ref 0.61–1.24)
GFR calc non Af Amer: 41 mL/min — ABNORMAL LOW (ref >60)
GFR, EST AFRICAN AMERICAN: 48 mL/min — AB (ref >60)
Glucose, Bld: 104 mg/dL — ABNORMAL HIGH (ref 65–99)
Potassium: 4.4 mmol/L (ref 3.5–5.1)
SODIUM: 138 mmol/L (ref 135–145)
TOTAL PROTEIN: 7.2 g/dL (ref 6.5–8.1)

## 2016-12-07 LAB — SURGICAL PCR SCREEN
MRSA, PCR: NEGATIVE
Staphylococcus aureus: NEGATIVE

## 2016-12-07 LAB — TYPE AND SCREEN
ABO/RH(D): B POS
Antibody Screen: NEGATIVE

## 2016-12-07 LAB — APTT: aPTT: 36 seconds (ref 24–36)

## 2016-12-07 LAB — PROTIME-INR
INR: 2.24
Prothrombin Time: 25.1 seconds — ABNORMAL HIGH (ref 11.4–15.2)

## 2016-12-07 NOTE — Pre-Procedure Instructions (Signed)
Jesus Hendrix  12/07/2016      [place], [place] [address] [place] [address] Phone: [phone] Fax: [phone]  [facility] - [place], [place] - [address] [place] [address] Phone: [phone] Fax: [facility] - [place], [place] [address] Phone: [phone] Fax: [phone]  [facility] [address] ([address]), [place] - [address]) [place] [address] Phone: [phone] Fax: [place], [place] [address] Phone: [phone] Fax: [phone]    Your procedure is scheduled on Tuesday January 2.  Report to [facility] Admitting at 7:45 A.M.  Call this number if you have problems the morning of surgery:  [phone]   Remember:  Do not eat food or drink liquids after midnight.  Take these medicines the morning of surgery with A SIP OF WATER: clonazepam (klonopin), metoprolol (toprol-XL), pantoprazole (protonix), acetaminophen (tylenol) if needed  FOLLOW MD's instructions on stopping Coumadin (warfarin) - stop taking this medication 5 days prior to surgery  7 days prior to surgery STOP taking any Aspirin, Aleve, Naproxen, Ibuprofen, Motrin, Advil, Goody's, BC's, all herbal medications, fish oil, and all vitamins      Do not wear jewelry, make-up or nail polish.  Do not wear lotions, powders, or perfumes, or deoderant.  Do not shave 48 hours prior to surgery.  Men may shave face and neck.  Do not bring valuables to the hospital.  [facility] is not responsible for any belongings or valuables.  Contacts, dentures or bridgework may not be worn into surgery.  Leave your suitcase in the car.  After surgery it may be brought  to your room.  For patients admitted to the hospital, discharge time will be determined by your treatment team.  Patients discharged the day of surgery will not be allowed to drive home.    Special instructions:    [facility]- Preparing For Surgery  Before surgery, you can play an important role. Because skin is not sterile, your skin needs to be as free of germs as possible. You can reduce the number of germs on your skin by washing with CHG (chlorahexidine gluconate) Soap before surgery.  CHG is an antiseptic cleaner which kills germs and bonds with the skin to continue killing germs even after washing.  Please do not use if you have an allergy to CHG or antibacterial soaps. If your skin becomes reddened/irritated stop using the CHG.  Do not shave (including legs and underarms) for at least 48 hours prior to first CHG shower. It is OK to shave your face.  Please follow these instructions carefully.   1. Shower the NIGHT BEFORE SURGERY and the MORNING OF SURGERY with CHG.   2. If you chose to wash your hair, wash your hair first as usual with your normal shampoo.  3. After you shampoo, rinse your hair and body thoroughly to remove the shampoo.  4. Use CHG as you would any other liquid soap. You can apply CHG directly to the skin and wash gently with a scrungie or a clean washcloth.   5. Apply the CHG Soap to your body ONLY FROM THE NECK DOWN.  Do not use on open  wounds or open sores. Avoid contact with your eyes, ears, mouth and genitals (private parts). Wash genitals (private parts) with your normal soap.  6. Wash thoroughly, paying special attention to the area where your surgery will be performed.  7. Thoroughly rinse your body with warm water from the neck down.  8. DO NOT shower/wash with your normal soap after using and rinsing off the CHG Soap.  9. Pat yourself dry with a CLEAN TOWEL.   10. Wear CLEAN PAJAMAS   11. Place CLEAN SHEETS on your bed the night of your first  shower and DO NOT SLEEP WITH PETS.    Day of Surgery: Do not apply any deodorants/lotions. Please wear clean clothes to the hospital/surgery center.      Please read over the following fact sheets that you were given. Total Joint Packet and MRSA Information

## 2016-12-08 LAB — URINE CULTURE: SPECIAL REQUESTS: NORMAL

## 2016-12-08 NOTE — Progress Notes (Signed)
Anesthesia Chart Review: Patient is a 70 year old male scheduled for left THA on 12/19/16 by Dr. Durward Fortes.  History includes former smoker (quit '76), HTN, HLD, CAD s/p CABG (by notes, LIMA-LAD, vein grafts to PDA and PL) 5/99/35, chronic systolic CHF, afib (diagnosed 06/06/16), aortic stenosis (mild 05/2016 echo), near syncope, anxiety with panic attacks, hiatal hernia, Wegener's granulomatosis with renal involvement, renal cell carcinoma s/p percutaneous resection/ablation 05/28/13 and re-ablation 03/31/14 and 09/29/14 and 05/11/15, CKD, atropic left kidney,COPD, GERD, gout, umbilical hernia s/p surgery '88, right THA, TMJ, cholecystectomy. He had evidence of remote right sided lacunar infarct on 06/06/16 head CT.   PCP is listed as Dr. Belinda Fisher.  Nephrologist is Dr. Caren Griffins Denu-Ciocca at [facility], last visit 11/13/16. He was without any active signs of vasculitis by exam. Urine sediment was not active and renal function was stable (BUN 30, Cr 1.28). He has known persistent + PR3 ANCA titer but given his long history of diminutive suppressive history neurologic cancer she did not favor further immunosuppressant therapy and absence of symptomatic disease. She recommended continued monitoring. She is aware of surgery plans.  Urologist is Dr. Kathrin Ruddy with [facility].   Cardiologist is Dr. Ida Rogue, last visit 10/02/16 for follow-up and preoperative clearance. Patient was in persistent afib, but with no plans to try to restore NSR as he was relatively asymptomatic and rate controlled. Recent echo showed anterior hypokinesis, but stress test was non-ischemic. No further testing recommended at that time. He thought patient was acceptable risk and could come off warfarin. May be good to switch back to and [facility] after the surgery as he indicated he might want to make the switch back to Eliquis (had been taken off before due to lack of pharmaceutical plan).  Meds include Lipitor, Klonopin, Flonase, Lasix,  losartan, Toprol-XL, Niaspan, Protonix, Flomax, warfarin (to hold 5 days prior to surgery).  BP (!) 147/78   Pulse 76   Temp 36.4 C   Resp 18   Ht 5\' 7"  (1.702 m)   Wt 215 lb 4.8 oz (97.7 kg)   SpO2 98%   BMI 33.72 kg/m   EKG 10/02/16 ([facility]): Afib at 77 bpm, occasional PVC, left BBB.  Nuclear stress test 08/03/16: Pharmacological myocardial perfusion imaging study with no significant  Ischemia Apical thinning noted (likely secondary to attenuation artifact) Inferior and inferoseptal hypokinesis,  EF estimated at 33% (likely secondary to underlying bundle branch block) No EKG changes concerning for ischemia at peak stress or in recovery. Baseline EKG with atrial fibrillation and LBBB Low risk scan  Echo 06/13/16: Study Conclusions - Left ventricle: The cavity size was normal. Systolic function was   mildly to moderately reduced. The estimated ejection fraction was   in the range of 40% to 45%. Hypokinesis of the anteroseptal   myocardium. Hypokinesis of the anterior myocardium. The study is   not technically sufficient to allow evaluation of LV diastolic   function. - Aortic valve: There was very mild stenosis. Mean gradient (S): 11   mm Hg. Peak gradient (S): 23 mm Hg. Valve area (VTI): 1.35 cm^2. - Mitral valve: There was mild regurgitation. - Left atrium: The atrium was moderately dilated. - Pulmonary arteries: Systolic pressure was within the normal   range. Impressions: - Challenging image quality. (Comparison echo 05/04/14: EF 45-50%, mild AS, mild MR.)  06/2016 30 day event monitor: Chronic atrial fibrillation with intraventricular conduction delay Frequent PVCs Minimum heart rate 50 bpm at 5:26 AM June 24 Maximum heart rate 125  bpm,  11:56 AM, July 10  According to cardiology records, "Previous cardiac catheterization in January 2007 details 75-80% long lesion in the LAD at the ostium, 95% lesion in the PDA. He was referred to bypass surgery."  Carotid U/S  05/05/14: Impression: Heterogeneous plaque, bilaterally. 1-39% bilateral ICA stenosis. Patent vertebral arteries with antegrade flow. Normal subclavian arteries, bilaterally.  CXR 06/06/16: IMPRESSION: No active cardiopulmonary disease.   Preoperative labs noted. BUN 31, Cr 1.60 (up fro 0.96 on 06/06/16; Cr 1.19-1.39 since 10/27/15 in [place]). Glucose 104. CBC WNL. INR 2.24. PTT 36. T&S done. Urine culture showed less than 10,000 colonies per mL insignificant growth. He will need a PT/INR on arrival. Will plan to recheck BMET as well to re-evaluate for renal function stability.   If follow-up labs acceptable and otherwise no acute changes then I anticipate that he can proceed as planned. Chart reviewed with anesthesiologist Dr. Marcie Bal.  [facility] Short Stay Center/Anesthesiology Phone [phone] 12/08/2016 4:49 PM

## 2016-12-18 MED ORDER — CEFAZOLIN SODIUM-DEXTROSE 2-4 GM/100ML-% IV SOLN
2.0000 g | INTRAVENOUS | Status: AC
  Administered 2016-12-19: 2 g via INTRAVENOUS
  Filled 2016-12-18: qty 100

## 2016-12-18 MED ORDER — TRANEXAMIC ACID 1000 MG/10ML IV SOLN
2000.0000 mg | INTRAVENOUS | Status: DC
  Filled 2016-12-18 (×2): qty 20

## 2016-12-19 ENCOUNTER — Inpatient Hospital Stay (HOSPITAL_COMMUNITY)
Admission: RE | Admit: 2016-12-19 | Discharge: 2016-12-21 | DRG: 470 | Disposition: A | Discharge status: Home Health | Payer: Medicare Other | Source: Ambulatory Visit | Attending: Orthopaedic Surgery | Admitting: Orthopaedic Surgery

## 2016-12-19 ENCOUNTER — Inpatient Hospital Stay (HOSPITAL_COMMUNITY): Payer: Medicare Other | Admitting: Vascular Surgery

## 2016-12-19 ENCOUNTER — Inpatient Hospital Stay (HOSPITAL_COMMUNITY): Payer: Medicare Other

## 2016-12-19 ENCOUNTER — Encounter (HOSPITAL_COMMUNITY): Payer: Self-pay | Admitting: *Deleted

## 2016-12-19 ENCOUNTER — Encounter (HOSPITAL_COMMUNITY)
Admission: RE | Disposition: A | Discharge status: Home Health | Payer: Self-pay | Source: Ambulatory Visit | Attending: Orthopaedic Surgery

## 2016-12-19 DIAGNOSIS — Z955 Presence of coronary angioplasty implant and graft: Secondary | ICD-10-CM | POA: Diagnosis not present

## 2016-12-19 DIAGNOSIS — M87052 Idiopathic aseptic necrosis of left femur: Secondary | ICD-10-CM | POA: Diagnosis not present

## 2016-12-19 DIAGNOSIS — M87852 Other osteonecrosis, left femur: Principal | ICD-10-CM | POA: Diagnosis present

## 2016-12-19 DIAGNOSIS — E785 Hyperlipidemia, unspecified: Secondary | ICD-10-CM | POA: Diagnosis present

## 2016-12-19 DIAGNOSIS — K219 Gastro-esophageal reflux disease without esophagitis: Secondary | ICD-10-CM | POA: Diagnosis present

## 2016-12-19 DIAGNOSIS — Z96641 Presence of right artificial hip joint: Secondary | ICD-10-CM | POA: Diagnosis present

## 2016-12-19 DIAGNOSIS — Z6833 Body mass index (BMI) 33.0-33.9, adult: Secondary | ICD-10-CM | POA: Diagnosis not present

## 2016-12-19 DIAGNOSIS — Z79899 Other long term (current) drug therapy: Secondary | ICD-10-CM | POA: Diagnosis not present

## 2016-12-19 DIAGNOSIS — Z87891 Personal history of nicotine dependence: Secondary | ICD-10-CM | POA: Diagnosis not present

## 2016-12-19 DIAGNOSIS — I4891 Unspecified atrial fibrillation: Secondary | ICD-10-CM | POA: Diagnosis present

## 2016-12-19 DIAGNOSIS — M1612 Unilateral primary osteoarthritis, left hip: Secondary | ICD-10-CM | POA: Diagnosis not present

## 2016-12-19 DIAGNOSIS — E1122 Type 2 diabetes mellitus with diabetic chronic kidney disease: Secondary | ICD-10-CM | POA: Diagnosis present

## 2016-12-19 DIAGNOSIS — Z96642 Presence of left artificial hip joint: Secondary | ICD-10-CM

## 2016-12-19 DIAGNOSIS — G8929 Other chronic pain: Secondary | ICD-10-CM

## 2016-12-19 DIAGNOSIS — Z951 Presence of aortocoronary bypass graft: Secondary | ICD-10-CM

## 2016-12-19 DIAGNOSIS — E6609 Other obesity due to excess calories: Secondary | ICD-10-CM | POA: Diagnosis present

## 2016-12-19 DIAGNOSIS — I2510 Atherosclerotic heart disease of native coronary artery without angina pectoris: Secondary | ICD-10-CM | POA: Diagnosis present

## 2016-12-19 DIAGNOSIS — Z7951 Long term (current) use of inhaled steroids: Secondary | ICD-10-CM

## 2016-12-19 DIAGNOSIS — I130 Hypertensive heart and chronic kidney disease with heart failure and stage 1 through stage 4 chronic kidney disease, or unspecified chronic kidney disease: Secondary | ICD-10-CM | POA: Diagnosis present

## 2016-12-19 DIAGNOSIS — M25552 Pain in left hip: Secondary | ICD-10-CM | POA: Diagnosis present

## 2016-12-19 DIAGNOSIS — M25452 Effusion, left hip: Secondary | ICD-10-CM | POA: Diagnosis present

## 2016-12-19 DIAGNOSIS — Z905 Acquired absence of kidney: Secondary | ICD-10-CM

## 2016-12-19 DIAGNOSIS — F419 Anxiety disorder, unspecified: Secondary | ICD-10-CM | POA: Diagnosis present

## 2016-12-19 DIAGNOSIS — J449 Chronic obstructive pulmonary disease, unspecified: Secondary | ICD-10-CM | POA: Diagnosis present

## 2016-12-19 DIAGNOSIS — I5022 Chronic systolic (congestive) heart failure: Secondary | ICD-10-CM | POA: Diagnosis present

## 2016-12-19 DIAGNOSIS — N189 Chronic kidney disease, unspecified: Secondary | ICD-10-CM | POA: Diagnosis present

## 2016-12-19 DIAGNOSIS — Z9049 Acquired absence of other specified parts of digestive tract: Secondary | ICD-10-CM

## 2016-12-19 DIAGNOSIS — Z85528 Personal history of other malignant neoplasm of kidney: Secondary | ICD-10-CM | POA: Diagnosis not present

## 2016-12-19 DIAGNOSIS — Z01818 Encounter for other preprocedural examination: Secondary | ICD-10-CM

## 2016-12-19 DIAGNOSIS — Z7901 Long term (current) use of anticoagulants: Secondary | ICD-10-CM | POA: Diagnosis not present

## 2016-12-19 DIAGNOSIS — E559 Vitamin D deficiency, unspecified: Secondary | ICD-10-CM | POA: Diagnosis present

## 2016-12-19 HISTORY — PX: TOTAL HIP ARTHROPLASTY: SHX124

## 2016-12-19 LAB — CBC
HCT: 35.9 % — ABNORMAL LOW (ref 39.0–52.0)
Hemoglobin: 12.1 g/dL — ABNORMAL LOW (ref 13.0–17.0)
MCH: 31.3 pg (ref 26.0–34.0)
MCHC: 33.7 g/dL (ref 30.0–36.0)
MCV: 92.8 fL (ref 78.0–100.0)
Platelets: 262 10*3/uL (ref 150–400)
RBC: 3.87 MIL/uL — ABNORMAL LOW (ref 4.22–5.81)
RDW: 14.3 % (ref 11.5–15.5)
WBC: 10.6 10*3/uL — ABNORMAL HIGH (ref 4.0–10.5)

## 2016-12-19 LAB — GLUCOSE, CAPILLARY
GLUCOSE-CAPILLARY: 111 mg/dL — AB (ref 65–99)
Glucose-Capillary: 102 mg/dL — ABNORMAL HIGH (ref 65–99)

## 2016-12-19 LAB — BASIC METABOLIC PANEL
Anion gap: 8 (ref 5–15)
BUN: 25 mg/dL — ABNORMAL HIGH (ref 6–20)
CO2: 27 mmol/L (ref 22–32)
Calcium: 9.3 mg/dL (ref 8.9–10.3)
Chloride: 102 mmol/L (ref 101–111)
Creatinine, Ser: 1.41 mg/dL — ABNORMAL HIGH (ref 0.61–1.24)
GFR calc Af Amer: 56 mL/min — ABNORMAL LOW (ref >60)
GFR, EST NON AFRICAN AMERICAN: 48 mL/min — AB (ref >60)
GLUCOSE: 120 mg/dL — AB (ref 65–99)
POTASSIUM: 4.1 mmol/L (ref 3.5–5.1)
Sodium: 137 mmol/L (ref 135–145)

## 2016-12-19 LAB — CREATININE, SERUM
CREATININE: 1.36 mg/dL — AB (ref 0.61–1.24)
GFR calc Af Amer: 58 mL/min — ABNORMAL LOW (ref >60)
GFR calc non Af Amer: 50 mL/min — ABNORMAL LOW (ref >60)

## 2016-12-19 LAB — PROTIME-INR
INR: 1.01
Prothrombin Time: 13.3 seconds (ref 11.4–15.2)

## 2016-12-19 SURGERY — ARTHROPLASTY, HIP, TOTAL,POSTERIOR APPROACH
Anesthesia: Monitor Anesthesia Care | Site: Hip | Laterality: Left

## 2016-12-19 MED ORDER — PROPOFOL 10 MG/ML IV BOLUS
INTRAVENOUS | Status: DC | PRN
  Administered 2016-12-19: 20 mg via INTRAVENOUS

## 2016-12-19 MED ORDER — LOSARTAN POTASSIUM 25 MG PO TABS
25.0000 mg | ORAL_TABLET | Freq: Every day | ORAL | Status: DC
  Administered 2016-12-19 – 2016-12-20 (×2): 25 mg via ORAL
  Filled 2016-12-19 (×2): qty 1

## 2016-12-19 MED ORDER — ACETAMINOPHEN 10 MG/ML IV SOLN
1000.0000 mg | Freq: Four times a day (QID) | INTRAVENOUS | Status: AC
  Administered 2016-12-19 – 2016-12-20 (×4): 1000 mg via INTRAVENOUS
  Filled 2016-12-19 (×4): qty 100

## 2016-12-19 MED ORDER — NIACIN ER (ANTIHYPERLIPIDEMIC) 500 MG PO TBCR
500.0000 mg | EXTENDED_RELEASE_TABLET | Freq: Every day | ORAL | Status: DC
  Administered 2016-12-20 – 2016-12-21 (×2): 500 mg via ORAL
  Filled 2016-12-19 (×2): qty 1

## 2016-12-19 MED ORDER — HYDROMORPHONE HCL 1 MG/ML IJ SOLN
INTRAMUSCULAR | Status: AC
  Filled 2016-12-19: qty 0.5

## 2016-12-19 MED ORDER — ONDANSETRON HCL 4 MG/2ML IJ SOLN
4.0000 mg | Freq: Four times a day (QID) | INTRAMUSCULAR | Status: DC | PRN
  Administered 2016-12-20: 4 mg via INTRAVENOUS
  Filled 2016-12-19: qty 2

## 2016-12-19 MED ORDER — OXYCODONE HCL 5 MG PO TABS
5.0000 mg | ORAL_TABLET | ORAL | Status: DC | PRN
  Administered 2016-12-19 – 2016-12-20 (×4): 10 mg via ORAL
  Filled 2016-12-19 (×3): qty 2

## 2016-12-19 MED ORDER — METOCLOPRAMIDE HCL 5 MG PO TABS: 5.0000 mg | ORAL_TABLET | Freq: Three times a day (TID) | ORAL | Status: DC | PRN

## 2016-12-19 MED ORDER — LACTATED RINGERS IV SOLN
INTRAVENOUS | Status: DC
  Administered 2016-12-19 (×2): via INTRAVENOUS

## 2016-12-19 MED ORDER — WARFARIN SODIUM 7.5 MG PO TABS
7.5000 mg | ORAL_TABLET | Freq: Once | ORAL | Status: AC
  Administered 2016-12-19: 7.5 mg via ORAL
  Filled 2016-12-19: qty 1

## 2016-12-19 MED ORDER — MAGNESIUM CITRATE PO SOLN: 1.0000 | Freq: Once | ORAL | Status: DC | PRN

## 2016-12-19 MED ORDER — HYDROMORPHONE HCL 1 MG/ML IJ SOLN
INTRAMUSCULAR | Status: AC
  Administered 2016-12-19: 0.5 mg via INTRAVENOUS
  Filled 2016-12-19: qty 0.5

## 2016-12-19 MED ORDER — PANTOPRAZOLE SODIUM 40 MG PO TBEC
40.0000 mg | DELAYED_RELEASE_TABLET | Freq: Every day | ORAL | Status: DC
  Administered 2016-12-20 – 2016-12-21 (×2): 40 mg via ORAL
  Filled 2016-12-19 (×2): qty 1

## 2016-12-19 MED ORDER — ENOXAPARIN SODIUM 40 MG/0.4ML ~~LOC~~ SOLN
40.0000 mg | SUBCUTANEOUS | Status: DC
  Administered 2016-12-20 – 2016-12-21 (×2): 40 mg via SUBCUTANEOUS
  Filled 2016-12-19 (×2): qty 0.4

## 2016-12-19 MED ORDER — ALUM & MAG HYDROXIDE-SIMETH 200-200-20 MG/5ML PO SUSP: 30.0000 mL | ORAL | Status: DC | PRN

## 2016-12-19 MED ORDER — BUPIVACAINE HCL (PF) 0.25 % IJ SOLN
INTRAMUSCULAR | Status: AC
  Filled 2016-12-19: qty 30

## 2016-12-19 MED ORDER — CHLORHEXIDINE GLUCONATE 4 % EX LIQD: 60.0000 mL | Freq: Once | CUTANEOUS | Status: DC

## 2016-12-19 MED ORDER — WARFARIN SODIUM 5 MG PO TABS: 5.0000 mg | ORAL_TABLET | ORAL | Status: DC

## 2016-12-19 MED ORDER — MEPERIDINE HCL 25 MG/ML IJ SOLN: 6.2500 mg | INTRAMUSCULAR | Status: DC | PRN

## 2016-12-19 MED ORDER — KETOROLAC TROMETHAMINE 15 MG/ML IJ SOLN
INTRAMUSCULAR | Status: AC
  Administered 2016-12-19: 7.5 mg via INTRAVENOUS
  Filled 2016-12-19: qty 1

## 2016-12-19 MED ORDER — PHENYLEPHRINE 40 MCG/ML (10ML) SYRINGE FOR IV PUSH (FOR BLOOD PRESSURE SUPPORT)
PREFILLED_SYRINGE | INTRAVENOUS | Status: AC
  Filled 2016-12-19: qty 20

## 2016-12-19 MED ORDER — BISACODYL 10 MG RE SUPP: 10.0000 mg | Freq: Every day | RECTAL | Status: DC | PRN

## 2016-12-19 MED ORDER — 0.9 % SODIUM CHLORIDE (POUR BTL) OPTIME
TOPICAL | Status: DC | PRN
  Administered 2016-12-19: 1000 mL

## 2016-12-19 MED ORDER — DIPHENHYDRAMINE HCL 12.5 MG/5ML PO ELIX: 12.5000 mg | ORAL_SOLUTION | ORAL | Status: DC | PRN

## 2016-12-19 MED ORDER — ACETAMINOPHEN 10 MG/ML IV SOLN
1000.0000 mg | Freq: Four times a day (QID) | INTRAVENOUS | Status: DC
  Administered 2016-12-19: 1000 mg via INTRAVENOUS
  Filled 2016-12-19: qty 100

## 2016-12-19 MED ORDER — SODIUM CHLORIDE 0.9 % IV SOLN: INTRAVENOUS | Status: DC

## 2016-12-19 MED ORDER — PHENYLEPHRINE HCL 10 MG/ML IJ SOLN
INTRAVENOUS | Status: DC | PRN
  Administered 2016-12-19: 50 ug/min via INTRAVENOUS

## 2016-12-19 MED ORDER — TAMSULOSIN HCL 0.4 MG PO CAPS
0.4000 mg | ORAL_CAPSULE | Freq: Every day | ORAL | Status: DC
  Administered 2016-12-19 – 2016-12-20 (×2): 0.4 mg via ORAL
  Filled 2016-12-19 (×2): qty 1

## 2016-12-19 MED ORDER — CLONAZEPAM 1 MG PO TABS
1.0000 mg | ORAL_TABLET | Freq: Two times a day (BID) | ORAL | Status: DC
  Administered 2016-12-19 – 2016-12-20 (×3): 1 mg via ORAL
  Filled 2016-12-19 (×3): qty 1

## 2016-12-19 MED ORDER — EPHEDRINE 5 MG/ML INJ
INTRAVENOUS | Status: AC
  Filled 2016-12-19: qty 20

## 2016-12-19 MED ORDER — MIDAZOLAM HCL 2 MG/2ML IJ SOLN
INTRAMUSCULAR | Status: AC
  Filled 2016-12-19: qty 2

## 2016-12-19 MED ORDER — EPINEPHRINE PF 1 MG/ML IJ SOLN
INTRAMUSCULAR | Status: AC
  Filled 2016-12-19: qty 1

## 2016-12-19 MED ORDER — DOCUSATE SODIUM 100 MG PO CAPS
100.0000 mg | ORAL_CAPSULE | Freq: Two times a day (BID) | ORAL | Status: DC
  Administered 2016-12-19 – 2016-12-21 (×4): 100 mg via ORAL
  Filled 2016-12-19 (×4): qty 1

## 2016-12-19 MED ORDER — KETOROLAC TROMETHAMINE 15 MG/ML IJ SOLN
7.5000 mg | Freq: Four times a day (QID) | INTRAMUSCULAR | Status: AC
  Administered 2016-12-19 – 2016-12-20 (×4): 7.5 mg via INTRAVENOUS
  Filled 2016-12-19 (×3): qty 1

## 2016-12-19 MED ORDER — METOCLOPRAMIDE HCL 5 MG/ML IJ SOLN: 5.0000 mg | Freq: Three times a day (TID) | INTRAMUSCULAR | Status: DC | PRN

## 2016-12-19 MED ORDER — HYDROMORPHONE HCL 2 MG/ML IJ SOLN
0.5000 mg | INTRAMUSCULAR | Status: DC | PRN
  Administered 2016-12-19: 1 mg via INTRAVENOUS
  Filled 2016-12-19: qty 1

## 2016-12-19 MED ORDER — METOPROLOL SUCCINATE ER 25 MG PO TB24
25.0000 mg | ORAL_TABLET | Freq: Every day | ORAL | Status: DC
  Administered 2016-12-20 – 2016-12-21 (×2): 25 mg via ORAL
  Filled 2016-12-19 (×2): qty 1

## 2016-12-19 MED ORDER — SODIUM CHLORIDE 0.9 % IV SOLN
75.0000 mL/h | INTRAVENOUS | Status: DC
  Administered 2016-12-20: 75 mL/h via INTRAVENOUS

## 2016-12-19 MED ORDER — PROPOFOL 500 MG/50ML IV EMUL
INTRAVENOUS | Status: DC | PRN
  Administered 2016-12-19: 75 ug/kg/min via INTRAVENOUS

## 2016-12-19 MED ORDER — CEFAZOLIN IN D5W 1 GM/50ML IV SOLN
1.0000 g | Freq: Four times a day (QID) | INTRAVENOUS | Status: AC
  Administered 2016-12-19 (×2): 1 g via INTRAVENOUS
  Filled 2016-12-19 (×2): qty 50

## 2016-12-19 MED ORDER — FLUTICASONE PROPIONATE 50 MCG/ACT NA SUSP
2.0000 | Freq: Every day | NASAL | Status: DC
  Administered 2016-12-19 – 2016-12-20 (×2): 2 via NASAL
  Filled 2016-12-19: qty 16

## 2016-12-19 MED ORDER — ONDANSETRON HCL 4 MG/2ML IJ SOLN: 4.0000 mg | Freq: Once | INTRAMUSCULAR | Status: DC | PRN

## 2016-12-19 MED ORDER — FENTANYL CITRATE (PF) 100 MCG/2ML IJ SOLN
INTRAMUSCULAR | Status: AC
  Filled 2016-12-19: qty 2

## 2016-12-19 MED ORDER — METHOCARBAMOL 1000 MG/10ML IJ SOLN
500.0000 mg | Freq: Four times a day (QID) | INTRAVENOUS | Status: DC | PRN
  Filled 2016-12-19: qty 5

## 2016-12-19 MED ORDER — FUROSEMIDE 40 MG PO TABS
40.0000 mg | ORAL_TABLET | Freq: Every day | ORAL | Status: DC
  Administered 2016-12-19 – 2016-12-21 (×3): 40 mg via ORAL
  Filled 2016-12-19 (×3): qty 1

## 2016-12-19 MED ORDER — PHENYLEPHRINE HCL 10 MG/ML IJ SOLN
INTRAMUSCULAR | Status: DC | PRN
  Administered 2016-12-19 (×2): 120 ug via INTRAVENOUS

## 2016-12-19 MED ORDER — METHOCARBAMOL 500 MG PO TABS
500.0000 mg | ORAL_TABLET | Freq: Four times a day (QID) | ORAL | Status: DC | PRN
  Administered 2016-12-19 – 2016-12-21 (×2): 500 mg via ORAL
  Filled 2016-12-19 (×2): qty 1

## 2016-12-19 MED ORDER — HYDROMORPHONE HCL 1 MG/ML IJ SOLN
0.2500 mg | INTRAMUSCULAR | Status: DC | PRN
  Administered 2016-12-19 (×2): 0.5 mg via INTRAVENOUS

## 2016-12-19 MED ORDER — ONDANSETRON HCL 4 MG PO TABS: 4.0000 mg | ORAL_TABLET | Freq: Four times a day (QID) | ORAL | Status: DC | PRN

## 2016-12-19 MED ORDER — MIDAZOLAM HCL 5 MG/5ML IJ SOLN
INTRAMUSCULAR | Status: DC | PRN
  Administered 2016-12-19: 2 mg via INTRAVENOUS

## 2016-12-19 MED ORDER — MAGNESIUM HYDROXIDE 400 MG/5ML PO SUSP: 30.0000 mL | Freq: Every day | ORAL | Status: DC | PRN

## 2016-12-19 MED ORDER — BUPIVACAINE-EPINEPHRINE (PF) 0.25% -1:200000 IJ SOLN
INTRAMUSCULAR | Status: DC | PRN
  Administered 2016-12-19: 30 mL via PERINEURAL

## 2016-12-19 MED ORDER — MENTHOL 3 MG MT LOZG: 1.0000 | LOZENGE | OROMUCOSAL | Status: DC | PRN

## 2016-12-19 MED ORDER — BUPIVACAINE HCL (PF) 0.5 % IJ SOLN
INTRAMUSCULAR | Status: DC | PRN
  Administered 2016-12-19: 3 mL via INTRATHECAL

## 2016-12-19 MED ORDER — TRANEXAMIC ACID 1000 MG/10ML IV SOLN
INTRAVENOUS | Status: DC | PRN
  Administered 2016-12-19: 2000 mg via TOPICAL

## 2016-12-19 MED ORDER — FENTANYL CITRATE (PF) 100 MCG/2ML IJ SOLN
INTRAMUSCULAR | Status: DC | PRN
  Administered 2016-12-19: 100 ug via INTRAVENOUS

## 2016-12-19 MED ORDER — WARFARIN - PHARMACIST DOSING INPATIENT
Freq: Every day | Status: DC
  Administered 2016-12-19: 18:00:00

## 2016-12-19 MED ORDER — PHENOL 1.4 % MT LIQD
1.0000 | OROMUCOSAL | Status: DC | PRN
Start: 2016-12-19 — End: 2016-12-21

## 2016-12-19 MED ORDER — EPHEDRINE SULFATE 50 MG/ML IJ SOLN
INTRAMUSCULAR | Status: DC | PRN
  Administered 2016-12-19: 10 mg via INTRAVENOUS

## 2016-12-19 MED ORDER — VITAMIN D3 25 MCG (1000 UNIT) PO TABS
2000.0000 [IU] | ORAL_TABLET | Freq: Every day | ORAL | Status: DC
  Administered 2016-12-20 – 2016-12-21 (×2): 2000 [IU] via ORAL
  Filled 2016-12-19 (×4): qty 2

## 2016-12-19 MED ORDER — ATORVASTATIN CALCIUM 40 MG PO TABS
40.0000 mg | ORAL_TABLET | Freq: Every day | ORAL | Status: DC
  Administered 2016-12-19 – 2016-12-21 (×3): 40 mg via ORAL
  Filled 2016-12-19 (×3): qty 1

## 2016-12-19 MED ORDER — ALBUMIN HUMAN 5 % IV SOLN
INTRAVENOUS | Status: DC | PRN
Start: 2016-12-19 — End: 2016-12-19
  Administered 2016-12-19: 11:00:00 via INTRAVENOUS

## 2016-12-19 MED ORDER — OXYCODONE HCL 5 MG PO TABS
ORAL_TABLET | ORAL | Status: AC
  Administered 2016-12-19: 10 mg via ORAL
  Filled 2016-12-19: qty 2

## 2016-12-19 SURGICAL SUPPLY — 58 items
BLADE SAW SAG 73X25 THK (BLADE) ×2
BLADE SAW SGTL 73X25 THK (BLADE) ×1 IMPLANT
BRUSH FEMORAL CANAL (MISCELLANEOUS) IMPLANT
CAPT HIP TOTAL 2 ×2 IMPLANT
COVER SURGICAL LIGHT HANDLE (MISCELLANEOUS) ×5 IMPLANT
DRAPE INCISE IOBAN 66X45 STRL (DRAPES) ×2 IMPLANT
DRAPE ORTHO SPLIT 77X108 STRL (DRAPES) ×6
DRAPE SURG ORHT 6 SPLT 77X108 (DRAPES) ×2 IMPLANT
DRSG MEPILEX BORDER 4X12 (GAUZE/BANDAGES/DRESSINGS) ×3 IMPLANT
DRSG MEPILEX BORDER 4X8 (GAUZE/BANDAGES/DRESSINGS) ×2 IMPLANT
DURAPREP 26ML APPLICATOR (WOUND CARE) ×6 IMPLANT
ELECT BLADE 6.5 EXT (BLADE) ×2 IMPLANT
ELECT REM PT RETURN 9FT ADLT (ELECTROSURGICAL) ×3
ELECTRODE REM PT RTRN 9FT ADLT (ELECTROSURGICAL) ×1 IMPLANT
EVACUATOR 1/8 PVC DRAIN (DRAIN) IMPLANT
FACESHIELD WRAPAROUND (MASK) ×9 IMPLANT
FACESHIELD WRAPAROUND OR TEAM (MASK) ×3 IMPLANT
GLOVE BIOGEL PI IND STRL 8 (GLOVE) ×2 IMPLANT
GLOVE BIOGEL PI IND STRL 8.5 (GLOVE) ×1 IMPLANT
GLOVE BIOGEL PI INDICATOR 8 (GLOVE) ×4
GLOVE BIOGEL PI INDICATOR 8.5 (GLOVE) ×2
GLOVE ECLIPSE 8.0 STRL XLNG CF (GLOVE) ×6 IMPLANT
GLOVE SURG ORTHO 8.5 STRL (GLOVE) ×6 IMPLANT
GOWN STRL REUS W/ TWL LRG LVL3 (GOWN DISPOSABLE) ×2 IMPLANT
GOWN STRL REUS W/TWL 2XL LVL3 (GOWN DISPOSABLE) ×6 IMPLANT
GOWN STRL REUS W/TWL LRG LVL3 (GOWN DISPOSABLE) ×6
HANDPIECE INTERPULSE COAX TIP (DISPOSABLE)
IMMOBILIZER KNEE 20 (SOFTGOODS) IMPLANT
IMMOBILIZER KNEE 22 UNIV (SOFTGOODS) ×3 IMPLANT
KIT BASIN OR (CUSTOM PROCEDURE TRAY) ×3 IMPLANT
KIT ROOM TURNOVER OR (KITS) ×3 IMPLANT
MANIFOLD NEPTUNE II (INSTRUMENTS) ×3 IMPLANT
NEEDLE 22X1 1/2 (OR ONLY) (NEEDLE) ×3 IMPLANT
NS IRRIG 1000ML POUR BTL (IV SOLUTION) ×3 IMPLANT
PACK TOTAL JOINT (CUSTOM PROCEDURE TRAY) ×3 IMPLANT
PACK UNIVERSAL I (CUSTOM PROCEDURE TRAY) ×3 IMPLANT
PAD ARMBOARD 7.5X6 YLW CONV (MISCELLANEOUS) ×3 IMPLANT
PRESSURIZER FEMORAL UNIV (MISCELLANEOUS) IMPLANT
SET HNDPC FAN SPRY TIP SCT (DISPOSABLE) IMPLANT
STAPLER VISISTAT 35W (STAPLE) ×2 IMPLANT
SUCTION FRAZIER HANDLE 10FR (MISCELLANEOUS) ×2
SUCTION TUBE FRAZIER 10FR DISP (MISCELLANEOUS) ×1 IMPLANT
SUT BONE WAX W31G (SUTURE) IMPLANT
SUT ETHIBOND NAB CT1 #1 30IN (SUTURE) ×7 IMPLANT
SUT MNCRL AB 3-0 PS2 18 (SUTURE) ×3 IMPLANT
SUT VIC AB 0 CT1 27 (SUTURE) ×6
SUT VIC AB 0 CT1 27XBRD ANBCTR (SUTURE) ×2 IMPLANT
SUT VIC AB 1 CT1 27 (SUTURE) ×6
SUT VIC AB 1 CT1 27XBRD ANBCTR (SUTURE) ×2 IMPLANT
SUT VIC AB 2-0 CT1 27 (SUTURE) ×3
SUT VIC AB 2-0 CT1 TAPERPNT 27 (SUTURE) ×1 IMPLANT
SYR CONTROL 10ML LL (SYRINGE) ×3 IMPLANT
TOWEL OR 17X24 6PK STRL BLUE (TOWEL DISPOSABLE) ×3 IMPLANT
TOWEL OR 17X26 10 PK STRL BLUE (TOWEL DISPOSABLE) ×3 IMPLANT
TOWER CARTRIDGE SMART MIX (DISPOSABLE) IMPLANT
TRAY CATH 16FR W/PLASTIC CATH (SET/KITS/TRAYS/PACK) ×2 IMPLANT
WATER STERILE IRR 1000ML POUR (IV SOLUTION) ×4 IMPLANT
WRAP KNEE MAXI GEL POST OP (GAUZE/BANDAGES/DRESSINGS) ×4 IMPLANT

## 2016-12-19 NOTE — Anesthesia Postprocedure Evaluation (Signed)
Anesthesia Post Note  Patient: Jesus Hendrix  Procedure(s) Performed: Procedure(s) (LRB): LEFT TOTAL HIP ARTHROPLASTY (Left)  Patient location during evaluation: PACU Anesthesia Type: MAC Level of consciousness: oriented and awake and alert Pain management: pain level controlled Vital Signs Assessment: post-procedure vital signs reviewed and stable Respiratory status: spontaneous breathing, respiratory function stable and patient connected to nasal cannula oxygen Cardiovascular status: blood pressure returned to baseline and stable Postop Assessment: no headache and no backache Anesthetic complications: no       Last Vitals:  Vitals:   12/19/16 1330 12/19/16 1342  BP: 112/66 110/74  Pulse: (!) 54 (!) 58  Resp: 14 10  Temp:      Last Pain:  Vitals:   12/19/16 1227  TempSrc:   PainSc: 0-No pain                 Rhonda Vangieson DAVID

## 2016-12-19 NOTE — Anesthesia Preprocedure Evaluation (Signed)
Anesthesia Evaluation  Patient identified by MRN, date of birth, ID band Patient awake    Reviewed: Allergy & Precautions, NPO status , Patient's Chart, lab work & pertinent test results  Airway Mallampati: I  TM Distance: >3 FB Neck ROM: Full    Dental   Pulmonary COPD, former smoker,    Pulmonary exam normal        Cardiovascular hypertension, + CAD and + CABG  Normal cardiovascular exam+ dysrhythmias Atrial Fibrillation   ECHO 05/2016 Study Conclusions  - Left ventricle: The cavity size was normal. Systolic function was   mildly to moderately reduced. The estimated ejection fraction was   in the range of 40% to 45%. Hypokinesis of the anteroseptal   myocardium. Hypokinesis of the anterior myocardium. The study is   not technically sufficient to allow evaluation of LV diastolic   function. - Aortic valve: There was very mild stenosis. Mean gradient (S): 11   mm Hg. Peak gradient (S): 23 mm Hg. Valve area (VTI): 1.35 cm^2. - Mitral valve: There was mild regurgitation. - Left atrium: The atrium was moderately dilated. - Pulmonary arteries: Systolic pressure was within the normal   range.    Neuro/Psych    GI/Hepatic GERD  Medicated and Controlled,  Endo/Other    Renal/GU Renal InsufficiencyRenal disease     Musculoskeletal   Abdominal   Peds  Hematology   Anesthesia Other Findings   Reproductive/Obstetrics                             Anesthesia Physical Anesthesia Plan  ASA: III  Anesthesia Plan: Spinal and MAC   Post-op Pain Management:    Induction: Intravenous  Airway Management Planned: Simple Face Mask  Additional Equipment:   Intra-op Plan:   Post-operative Plan:   Informed Consent: I have reviewed the patients History and Physical, chart, labs and discussed the procedure including the risks, benefits and alternatives for the proposed anesthesia with the patient or  authorized representative who has indicated his/her understanding and acceptance.     Plan Discussed with: CRNA and Surgeon  Anesthesia Plan Comments:         Anesthesia Quick Evaluation

## 2016-12-19 NOTE — Progress Notes (Signed)
ANTICOAGULATION CONSULT NOTE - Initial Consult  Pharmacy Consult for Coumadin Indication: atrial fibrillation  No Known Allergies  Patient Measurements: Weight: 215 lb (97.5 kg)  Vital Signs: Temp: 97.3 F (36.3 C) (01/02 1510) Temp Source: Oral (01/02 0806) BP: 127/72 (01/02 1510) Pulse Rate: 70 (01/02 1510)  Labs:  Recent Labs  12/19/16 0752  LABPROT 13.3  INR 1.01  CREATININE 1.41*    Estimated Creatinine Clearance: 52.7 mL/min (by C-G formula based on SCr of 1.41 mg/dL (H)).   Medical History: Past Medical History:  Diagnosis Date  . Anxiety   . Aortic stenosis    a. mild on echo 04/2014  . Atrial fibrillation ([place]) 06/06/2016   a. newly diagnosed 06/06/2016; b. CHADS2VASc => 5 (CHF, HTN, age x 1, DM, vascular disease)  . Choledocholithiasis   . Chronic kidney disease   . Chronic systolic CHF (congestive heart failure) ([place])    a. echo 04/2014: EF 45-50%, nl LV dia fxn, mild AS, LA mildly dilated, PASP nl  . COPD (chronic obstructive pulmonary disease) ([place])    Not on home oxygen  . Coronary artery disease    a. status post CABG in 2007; b. nuc stress test 2014: small region of mild ischemia in the inferior territory, EF 61%, no ischemia, scan similar to prior scan in 2009  . Exogenous obesity   . GERD (gastroesophageal reflux disease)   . Gout   . Hiatal hernia   . Hip pain, chronic 08/03/2016   Waiting to have hip replacement pending NM stress test results per patient.  . History of colonic diverticulitis   . History of renal cell carcinoma    a. s/p laser ablation   . Hyperlipidemia   . Hypertension   . Near syncope    a. 3 separate events in 2017  . Nephrolithiasis   . Panic attacks   . Spinal stenosis   . Temporomandibular joint pain dysfunction syndrome   . Umbilical hernia   . Vitamin D deficiency   . Wegener's granulomatosis with renal involvement ([place])     Medications:  Prescriptions Prior to Admission  Medication Sig Dispense Refill Last  Dose  . acetaminophen (TYLENOL) 325 MG tablet Take 650 mg by mouth 3 (three) times daily as needed for moderate pain.   12/18/2016 at Unknown time  . atorvastatin (LIPITOR) 40 MG tablet Take 1 tablet (40 mg total) by mouth daily. 90 tablet 4 12/18/2016 at Unknown time  . Cholecalciferol (VITAMIN D) 2000 units CAPS Take 2,000 Units by mouth daily.   Past Week at Unknown time  . fluticasone (FLONASE) 50 MCG/ACT nasal spray Place 2 sprays into both nostrils at bedtime.    Past Week at Unknown time  . furosemide (LASIX) 20 MG tablet Take 1 tablet (20 mg total) by mouth 2 (two) times daily as needed for fluid or edema. (Patient taking differently: Take 40 mg by mouth daily. ) 60 tablet 6 Past Week at Unknown time  . losartan (COZAAR) 50 MG tablet Take 25 mg by mouth at bedtime.    12/18/2016 at Unknown time  . metoprolol succinate (TOPROL-XL) 25 MG 24 hr tablet Take 1 tablet (25 mg total) by mouth daily. 30 tablet 2 12/19/2016 at 0600  . niacin (NIASPAN) 500 MG CR tablet Take 500 mg by mouth daily.   Past Week at Unknown time  . pantoprazole (PROTONIX) 40 MG tablet Take 40 mg by mouth daily.   12/19/2016 at 0600  . tamsulosin (FLOMAX) 0.4 MG CAPS  capsule Take 0.4 mg by mouth at bedtime.    Past Week at Unknown time  . warfarin (COUMADIN) 5 MG tablet Take as directed by Coumadin Clinic (Patient taking differently: Take 5 mg by mouth as directed. On Sunday, Wednesday & Thursday take 1.5 tablet (7.5) then on all days take 1 tablet (5 mg)) 120 tablet 1 12/13/2016  . clonazePAM (KLONOPIN) 1 MG tablet Take 1 mg by mouth 2 (two) times daily.   12/07/2016 at Unknown time    Assessment: 71 yo M on Coumadin PTA for hx of afib.  Last dose 12/27 in anticipation of L hip surgery 12/19/16.  INR 1.01 this AM  PTA dose = 5mg  daily except 7.5mg  TThSun  Goal of Therapy:  INR 2-3 Monitor platelets by anticoagulation protocol: Yes   Plan:  Coumadin 7.5mg  PO x 1 tonight Daily INR  [employer], Pharm.D., BCPS Clinical  Pharmacist Pager [phone] 12/19/2016 4:36 PM

## 2016-12-19 NOTE — Transfer of Care (Signed)
Immediate Anesthesia Transfer of Care Note  Patient: Jesus Hendrix  Procedure(s) Performed: Procedure(s): LEFT TOTAL HIP ARTHROPLASTY (Left)  Patient Location: PACU  Anesthesia Type:MAC and Spinal  Level of Consciousness: awake, alert , oriented and patient cooperative  Airway & Oxygen Therapy: Patient Spontanous Breathing and Patient connected to face mask oxygen  Post-op Assessment: Report given to RN and Post -op Vital signs reviewed and stable  Post vital signs: Reviewed and stable BP 99/65 spo2 99%  HR 72  Last Vitals:  Vitals:   12/19/16 0806 12/19/16 1227  BP: 136/79 99/65  Pulse: 71 63  Resp: 18 12  Temp: 36.4 C     Last Pain:  Vitals:   12/19/16 0814  TempSrc:   PainSc: 10-Worst pain ever      Patients Stated Pain Goal: 3 (40/81/44 8185)  Complications: No apparent anesthesia complications

## 2016-12-19 NOTE — Anesthesia Procedure Notes (Signed)
Spinal  Start time: 12/19/2016 10:10 AM End time: 12/19/2016 10:20 AM Staffing Anesthesiologist: Lillia Abed Performed: anesthesiologist  Preanesthetic Checklist Completed: patient identified, surgical consent, pre-op evaluation, timeout performed, IV checked, risks and benefits discussed and monitors and equipment checked Spinal Block Patient position: sitting Prep: Betadine Patient monitoring: heart rate, cardiac monitor, continuous pulse ox and blood pressure Approach: right paramedian Location: L3-4 Injection technique: single-shot Needle Needle type: Pencan  Needle gauge: 24 G Needle length: 9 cm Needle insertion depth: 7 cm

## 2016-12-19 NOTE — Progress Notes (Signed)
Physical Therapy Evaluation Patient Details Name: Jesus Hendrix MRN: [id number] DOB: [birth year] Today's Date: 12/19/2016   History of Present Illness  Pt with L THA on 12/19/2016 2/2 diagnosis of L hip avascular necrosis. PMH of aortic stenosis, afib, chronic kidney disease, chronig systolic CHF (congestive heart failure), COPD, Gout, HTN, panic attacks, spinal stenosis,   Clinical Impression  PTA, pt independent with all ADLs and community mobility. Was working full-time. Pt currently lives with wife and will have family available to help 24/7. Pt required mod assist for trunk upright during supine to sit and min assist to power to stand. Pt with poor recollection of posterior hip precautions and would benefit from continual reinforcement. Pt demonstrated good strength during HEP. Pt will benefit from HHPT to address deficits in activity tolerance and mobility before return to baseline function. PT will continue to follow acutely.     Follow Up Recommendations Home health PT;Supervision/Assistance - 24 hour    Equipment Recommendations  3in1 (PT)    Recommendations for Other Services       Precautions / Restrictions Precautions Precautions: Posterior Hip;Fall Precaution Booklet Issued: Yes (comment) Precaution Comments: Reviewed and given handout Required Braces or Orthoses: Knee Immobilizer - Right Knee Immobilizer - Right: Other (comment) (on in bed) Restrictions Weight Bearing Restrictions: No      Mobility  Bed Mobility Overal bed mobility: Needs Assistance Bed Mobility: Supine to Sit     Supine to sit: Mod assist     General bed mobility comments: Mod assist to upright trunk. VC for sequencing and hand placement.   Transfers Overall transfer level: Needs assistance Equipment used: Rolling walker (2 wheeled) Transfers: Sit to/from Stand Sit to Stand: Min assist         General transfer comment: Min assist to power to stand and maintain standing balance. VCs for  sequencing. Fair compliance with hip precautions.  Ambulation/Gait Ambulation/Gait assistance: Min guard Ambulation Distance (Feet): 10 Feet Assistive device: Rolling walker (2 wheeled) Gait Pattern/deviations: Step-through pattern;Decreased stride length;Decreased weight shift to left;Antalgic Gait velocity: decreased Gait velocity interpretation: Below normal speed for age/gender General Gait Details: slow, unsteady gait, required min guard for safety.   Stairs            Wheelchair Mobility    Modified Rankin (Stroke Patients Only)       Balance Overall balance assessment: Needs assistance Sitting-balance support: Feet supported;Single extremity supported Sitting balance-Leahy Scale: Fair     Standing balance support: Bilateral upper extremity supported;During functional activity Standing balance-Leahy Scale: Poor Standing balance comment: Pt reliant on RW to maintain standing balance                             Pertinent Vitals/Pain Pain Assessment: 0-10 Pain Score: 10-Worst pain ever Pain Location: L hip, and catheter Pain Descriptors / Indicators: Aching Pain Intervention(s): Limited activity within patient's tolerance;Monitored during session;Repositioned    Home Living Family/patient expects to be discharged to:: Private residence Living Arrangements: Spouse/significant other Available Help at Discharge: Friend(s);Available 24 hours/day Type of Home: House Home Access: Stairs to enter Entrance Stairs-Rails: Can reach both Entrance Stairs-Number of Steps: 2 Home Layout: One level Home Equipment: Walker - 2 wheels;Cane - quad;Shower seat      Prior Function Level of Independence: Independent (occasionally with cane)               Hand Dominance        Extremity/Trunk Assessment  Upper Extremity Assessment Upper Extremity Assessment: Overall WFL for tasks assessed    Lower Extremity Assessment Lower Extremity Assessment: LLE  deficits/detail LLE Deficits / Details: Decreased ROM and strength as expected postoperatively.       Communication   Communication: No difficulties  Cognition Arousal/Alertness: Awake/alert Behavior During Therapy: WFL for tasks assessed/performed Overall Cognitive Status: Within Functional Limits for tasks assessed                      General Comments      Exercises Total Joint Exercises Ankle Circles/Pumps: AROM;Both;10 reps;Supine;Seated Quad Sets: AROM;Left;10 reps;Seated Short Arc Quad: AROM;Left;10 reps;Seated Heel Slides: AROM;Left;10 reps;Seated Hip ABduction/ADduction: AROM;Left;10 reps;Seated   Assessment/Plan    PT Assessment Patient needs continued PT services  PT Problem List Decreased strength;Decreased range of motion;Decreased activity tolerance;Decreased balance;Decreased mobility;Decreased knowledge of use of DME;Decreased safety awareness;Decreased knowledge of precautions;Pain          PT Treatment Interventions Gait training;DME instruction;Stair training;Functional mobility training;Therapeutic activities;Therapeutic exercise;Balance training;Patient/family education    PT Goals (Current goals can be found in the Care Plan section)  Acute Rehab PT Goals Patient Stated Goal: to go back to work PT Goal Formulation: With patient Time For Goal Achievement: 01/02/17 Potential to Achieve Goals: Fair    Frequency 7X/week   Barriers to discharge        Co-evaluation               End of Session Equipment Utilized During Treatment: Gait belt Activity Tolerance: Patient tolerated treatment well Patient left: in chair;with call bell/phone within reach;with family/visitor present Nurse Communication: Mobility status         Time: 2458-0998 (3382-5053) PT Time Calculation (min) (ACUTE ONLY): 23 min   Charges:   PT Evaluation $PT Eval Moderate Complexity: 1 Procedure PT Treatments $Gait Training: 8-22 mins $Therapeutic  Exercise: 8-22 mins   PT G Codes:        Tonia Brooms 01-11-17, 5:44 PM Tonia Brooms, SPT [phone]

## 2016-12-19 NOTE — Op Note (Signed)
PATIENT ID:      SUNNY GAINS  MRN:     [id number] DOB/AGE:    [birth year] / 71 y.o.       OPERATIVE REPORT    DATE OF PROCEDURE:  12/19/2016       PREOPERATIVE DIAGNOSIS:   LEFT HIP AVASCULAR NECROSIS                                                       Estimated body mass index is 33.67 kg/m as calculated from the following:   Height as of 12/07/16: 5\' 7"  (1.702 m).   Weight as of this encounter: 215 lb (97.5 kg).     POSTOPERATIVE DIAGNOSIS:   LEFT HIP AVASCULAR NECROSIS                                                                     Estimated body mass index is 33.67 kg/m as calculated from the following:   Height as of 12/07/16: 5\' 7"  (1.702 m).   Weight as of this encounter: 215 lb (97.5 kg).     PROCEDURE:  Procedure(s): LEFT TOTAL HIP ARTHROPLASTY      SURGEON:  Joni Fears, MD    ASSISTANT:   Biagio Borg, PA-C   (Present and scrubbed throughout the case, critical for assistance with exposure, retraction, instrumentation, and closure.)          ANESTHESIA: spinal and IV sedation     DRAINS: none :      TOURNIQUET TIME: * No tourniquets in log *    COMPLICATIONS:  None   CONDITION:  stable  PROCEDURE IN DETAIL: [place] 12/19/2016, 12:09 PM

## 2016-12-19 NOTE — H&P (Signed)
The recent History & Physical has been reviewed. I have personally examined the patient today. There is no interval change to the documented History & Physical. The patient would like to proceed with the procedure.  Garald Balding 12/19/2016,  9:23 AM

## 2016-12-20 ENCOUNTER — Encounter (HOSPITAL_COMMUNITY): Payer: Self-pay | Admitting: Orthopaedic Surgery

## 2016-12-20 LAB — CBC
HCT: 33.5 % — ABNORMAL LOW (ref 39.0–52.0)
Hemoglobin: 11.2 g/dL — ABNORMAL LOW (ref 13.0–17.0)
MCH: 31.1 pg (ref 26.0–34.0)
MCHC: 33.4 g/dL (ref 30.0–36.0)
MCV: 93.1 fL (ref 78.0–100.0)
PLATELETS: 239 10*3/uL (ref 150–400)
RBC: 3.6 MIL/uL — ABNORMAL LOW (ref 4.22–5.81)
RDW: 14.5 % (ref 11.5–15.5)
WBC: 6.5 10*3/uL (ref 4.0–10.5)

## 2016-12-20 LAB — BASIC METABOLIC PANEL
Anion gap: 8 (ref 5–15)
BUN: 21 mg/dL — AB (ref 6–20)
CALCIUM: 8.5 mg/dL — AB (ref 8.9–10.3)
CO2: 27 mmol/L (ref 22–32)
Chloride: 103 mmol/L (ref 101–111)
Creatinine, Ser: 1.35 mg/dL — ABNORMAL HIGH (ref 0.61–1.24)
GFR calc Af Amer: 59 mL/min — ABNORMAL LOW (ref >60)
GFR, EST NON AFRICAN AMERICAN: 51 mL/min — AB (ref >60)
GLUCOSE: 115 mg/dL — AB (ref 65–99)
Potassium: 4.3 mmol/L (ref 3.5–5.1)
Sodium: 138 mmol/L (ref 135–145)

## 2016-12-20 LAB — PROTIME-INR
INR: 1.16
Prothrombin Time: 14.9 seconds (ref 11.4–15.2)

## 2016-12-20 MED ORDER — SODIUM CHLORIDE 0.9% FLUSH
3.0000 mL | Freq: Two times a day (BID) | INTRAVENOUS | Status: DC
  Administered 2016-12-20 – 2016-12-21 (×3): 3 mL via INTRAVENOUS

## 2016-12-20 MED ORDER — WARFARIN SODIUM 7.5 MG PO TABS
7.5000 mg | ORAL_TABLET | Freq: Once | ORAL | Status: AC
  Administered 2016-12-20: 7.5 mg via ORAL
  Filled 2016-12-20: qty 1

## 2016-12-20 MED ORDER — SODIUM CHLORIDE 0.9% FLUSH: 3.0000 mL | INTRAVENOUS | Status: DC | PRN

## 2016-12-20 NOTE — Progress Notes (Signed)
PT/OT note After treating pt today, noted that pt continues to be impulsive with very poor safety awareness even with max education.  Wife agreed to meet PT in the am for education however when OT went it to see pt after PT treatment in pm, daughter expressed concern regarding ability for wife and family to care for pt at home.  Spoke with Reginold Agent regarding above concerns and she agreed that PT/OT update d/c recommendations as pt may need SNF for therapy.  Will still plan to perform education in am.   [facility] [phone] (pager)

## 2016-12-20 NOTE — Progress Notes (Signed)
12/20/16 1210  PT Visit Information  Last PT Received On 12/20/16  Assistance Needed +1  History of Present Illness Pt with L THA on 12/19/2016 2/2 diagnosis of L hip avascular necrosis. PMH of aortic stenosis, afib, chronic kidney disease, chronig systolic CHF (congestive heart failure), COPD, Gout, HTN, panic attacks, spinal stenosis,   Subjective Data  Patient Stated Goal to go back to work  Precautions  Precautions Posterior Hip;Fall  Precaution Booklet Issued Yes (comment)  Precaution Comments Reviewed and given handout  Required Braces or Orthoses Knee Immobilizer - Right  Knee Immobilizer - Right Other (comment) (on when in bed)  Restrictions  Weight Bearing Restrictions Yes  LLE Weight Bearing WBAT  Pain Assessment  Pain Assessment 0-10  Pain Score 7  Pain Location L hip  Pain Descriptors / Indicators Aching  Pain Intervention(s) Limited activity within patient's tolerance;Monitored during session;Repositioned  Cognition  Arousal/Alertness Awake/alert  Behavior During Therapy WFL for tasks assessed/performed  Overall Cognitive Status Within Functional Limits for tasks assessed  Bed Mobility  Overal bed mobility Needs Assistance  Bed Mobility Sit to Supine  Supine to sit Mod assist  General bed mobility comments Mod assist to mobilize B LEs. Able to scoot without physical assist  Transfers  Overall transfer level Needs assistance  Equipment used Rolling walker (2 wheeled)  Transfers Sit to/from Stand  Sit to Stand Mod assist;Min assist  General transfer comment Min to mod assist to power to stand and to maintain balance. 1 LOB once pt standing as he c/o dizziness but able to correct with PT assist. good compliance with posterior hip precautions   Ambulation/Gait  Ambulation/Gait assistance Min guard  Ambulation Distance (Feet) 100 Feet  Assistive device Rolling walker (2 wheeled)  Gait Pattern/deviations Step-through pattern;Decreased stance time - left;Decreased  step length - left;Decreased weight shift to left;Antalgic;Trunk flexed  General Gait Details unsteady antalgic gait. Cued pt to slow down today 2/2 worsening pronounced antalgia with increased speed. Verbal cues for trunk upright. Verbal cues for equal time spent on BLEs  Gait velocity decreased  Gait velocity interpretation Below normal speed for age/gender  Stairs Yes  Stairs assistance Mod assist  Stair Management One rail Right;Step to pattern;Forwards  Number of Stairs 4  General stair comments Pt impulsive with stair training. Required several verbal cues for step to pattern, sequencing of steps, and safety. Pt with difficulty clearing step with LLE on descent. Pt family reports that there he will only be able to reach one rail at a time, contrary to what was said at evaluation. Wife present during stair training, will practice again for reinforcement  Balance  Overall balance assessment Needs assistance  Sitting-balance support Feet supported;No upper extremity supported  Sitting balance-Leahy Scale Fair  Standing balance support Bilateral upper extremity supported;During functional activity  Standing balance-Leahy Scale Poor  Standing balance comment Pt reliant on RW to maintain standing balance  PT - End of Session  Equipment Utilized During Treatment Gait belt  Activity Tolerance Patient tolerated treatment well  Patient left in chair;with call bell/phone within reach;with family/visitor present  Nurse Communication Mobility status  PT - Assessment/Plan  PT Plan Current plan remains appropriate  PT Frequency (ACUTE ONLY) 7X/week  Follow Up Recommendations Home health PT;Supervision/Assistance - 24 hour  PT equipment 3in1 (PT)  PT Goal Progression  Progress towards PT goals Progressing toward goals  Acute Rehab PT Goals  PT Goal Formulation With patient  Time For Goal Achievement 01/02/17  Potential to Achieve Goals  Fair  PT Time Calculation  PT Start Time (ACUTE ONLY) 1208   PT Stop Time (ACUTE ONLY) 1240  PT Time Calculation (min) (ACUTE ONLY) 32 min   Pt with impulsive behavior during stair training today. Pt required several verbal cues for safety and step-to pattern. Pt with +2 LOB as he had difficulty clearing LLE while descending. Wife present during stair training but would benefit from reinforcement tomorrow morning. Concern for wife ability to cue during pt ambulation as pt also demonstrates impulsive behavior (increased gait speed) with ambulation. Will also practice ambulation with wife tomorrow morning. Pt dizzy upon standing. BP measured 144/87. PT continues to be appropriate for HHPT at this time. PT will continue to follow acutely.   Tonia Brooms, SPT [phone]

## 2016-12-20 NOTE — Op Note (Signed)
NAME:  CONROY, GORACKE NO.:  MEDICAL RECORD NO.:  [id number]  LOCATION:                                 FACILITY:  PHYSICIAN:  Vonna Kotyk. Damar Petit, M.D.DATE OF BIRTH:  Jan 10, 1946  DATE OF PROCEDURE:  12/19/2016 DATE OF DISCHARGE:                              OPERATIVE REPORT   PREOPERATIVE DIAGNOSIS:  Avascular necrosis, left hip.  POSTOPERATIVE DIAGNOSIS:  Avascular necrosis, left hip.  PROCEDURE:  Left total hip replacement.  SURGEON:  Vonna Kotyk. Durward Fortes, M.D.  ASSISTANT:  Aaron Edelman D. Petrarca, PA-C, was present throughout the operative procedure to ensure its timely completion.  ANESTHESIA:  Spinal with IV sedation.  COMPLICATIONS:  None.  COMPONENTS:  DePuy AML large stature 13.5 mm femoral component, 36 mm outer diameter hip ball with a +8.5 neck length, 54 mm outer diameter Gription 3 acetabular component, apex hole eliminator, and a Marathon polyethylene liner.  DESCRIPTION OF PROCEDURE:  Mr. Beougher was met in the holding area, identified the left hip was the appropriate operative site and marked it accordingly.  Any questions were answered.  The patient was then transported to room #7.  Anesthesia performed spinal and IV sedation.  Catheter was inserted.  Urine was clear.  The patient was then placed in the lateral decubitus position with the left side up and secured to the operating room table with the Innomed hip system.  A time-out was called again.  The left hip was then prepped from the iliac crest to the ankle with chlorhexidine scrub and DuraPrep x2.  Sterile draping was performed.  Time-out was called again.  A routine Southern incision was utilized in the left hip and via sharp dissection, carried down to subcutaneous tissue.  Gross bleeders were Bovie coagulated.  The Bovie was used to incise the adipose tissue. Self-retaining retractors were inserted.  The IT band was identified, incised with the Bovie extending through the  gluteal muscles.  These were separated manually.  Self-retaining retractors were inserted more deeply.  The short external rotators were identified.  Tendinous structures were tagged and allowed to retract.  The capsule was identified and incised along the femoral neck and head.  There was a clear yellow joint effusion, probably 6 to 7 mL.  Head was then dislocated posteriorly and then osteotomized using the calcar guide with the oscillating saw.  The head was malformed, there was obvious evidence of avascular necrosis with a ping-pong effect.  In the piriformis fossa, a starter hole was then made, calcar finder was then inserted.  Retractors were then placed about the hip.  We reamed to 13 mm to accept a 13.5 component.  Rasping was then performed sequentially to a large 13.5 component, which sat nicely on the calcar about a fingerbreadth proximal to the lesser trochanter.  There were obvious areas of avascular necrosis right down to our calcar cut.  Large stature 13.5 mm rasp fit very nicely without any toggling.  Retractors were then placed about the acetabulum.  The labrum was sharply excised.  Reaming was performed sequentially to 53 mm to accept a 54 component. We had nice bleeding bone.  We trialed both  a 52 and a 54 component, 52 would completely seat, 54 would not put a good rim fit.  Accordingly, the final acetabular component was impacted with the Gription 3 54 mm component.  This was impacted nicely within the acetabulum using the acetabular guide.  The trial polyethylene component was inserted followed by the 13.5 mm large stature rasp.  We trialed several neck lengths and felt that the 8.5 mm neck length gave [place] perfect stability and reestablished leg lengths.  The trial components were removed.  We irrigated the acetabulum.  We inserted an apex hole eliminator and the final polyethylene acetabular component.  We irrigated the acetabulum again.  The final femoral  component was then impacted to 13.5 mm large stature component, it fit nicely on the calcar.  We again irrigated the wound. We used the 8.5 neck length with 36 mm hip ball.  This was impacted on the clean [facility] taper neck.  We cleaned the acetabulum.  We then reduced the hip through a full range of motion and had perfect stability.  We felt leg lengths were perfectly symmetrical.  The capsule was then closed anatomically with interrupted 0-Ethibond short external rotators closed with the similar material.  We did apply topical tranexamic acid into the wound.  The IT band was then closed with a running #1 Vicryl, subcu in several layers of Vicryl, and 3-0 Monocryl.  Skin was closed with skin clips. Sterile bulky dressing was applied.  The patient was then placed supine, a knee immobilizer placed on the left lower extremity.  The patient was then transported to the postanesthesia recovery room on a stretcher without complications.     Vonna Kotyk. Durward Fortes, M.D.     PWW/MEDQ  D:  12/19/2016  T:  12/19/2016  Job:  [id number]

## 2016-12-20 NOTE — Progress Notes (Signed)
ANTICOAGULATION CONSULT NOTE - FOLLOW UP  Pharmacy Consult:  Coumadin Indication: atrial fibrillation  No Known Allergies  Patient Measurements: Weight: 215 lb (97.5 kg)  Vital Signs: Temp: 98.2 F (36.8 C) (01/03 0648) Temp Source: Oral (01/03 0648) BP: 105/61 (01/03 0812) Pulse Rate: 62 (01/03 0812)  Labs:  Recent Labs  12/19/16 0752 12/19/16 1620 12/20/16 0651  HGB  --  12.1* 11.2*  HCT  --  35.9* 33.5*  PLT  --  262 239  LABPROT 13.3  --  14.9  INR 1.01  --  1.16  CREATININE 1.41* 1.36* 1.35*    Estimated Creatinine Clearance: 55.1 mL/min (by C-G formula based on SCr of 1.35 mg/dL (H)).    Assessment: 70 YOM on Coumadin PTA for history of Afib.  Now s/p left THR and Coumadin resumed in addition to prophylactic Lovenox.  INR sub-therapeutic as expected.  No bleeding reported.  PTA dose = 5mg  daily except 7.5mg  TThSun   Goal of Therapy:  INR 2-3    Plan:  - Coumadin 7.5mg  PO x 1 tonight - Lovenox 40mg  SQ Q24H until INR >/= 1.8 - Daily INR   Zev Blue D. Mina Marble, PharmD, BCPS Pager:  [phone] 12/20/2016, 10:01 AM

## 2016-12-20 NOTE — Progress Notes (Signed)
Called after hours number for Dr. Durward Fortes.  Patient is not wanting to take narcotics but would like Tylenol.  Waiting to hear back from office to see if this can be added.

## 2016-12-20 NOTE — Progress Notes (Signed)
   12/20/16 1352  PT - Assessment/Plan  PT Plan Discharge plan needs to be updated  Follow Up Recommendations SNF;Supervision/Assistance - 24 hour  [place] [phone] (pager)

## 2016-12-20 NOTE — Discharge Instructions (Signed)

## 2016-12-20 NOTE — Progress Notes (Signed)
Family concerned, thought patient seemed disoriented.  Assessed patient.  Vital signs are stable, he answered my questions correctly.  Helped patient to the bathroom.  Told patient we will continue to monitor him.  Patient states he feels okay.

## 2016-12-20 NOTE — Evaluation (Addendum)
Occupational Therapy Evaluation Patient Details Name: Jesus Hendrix MRN: [id number] DOB: 11-29-1946 Today's Date: 12/20/2016    History of Present Illness Pt with L THA on 12/19/2016 2/2 diagnosis of L hip avascular necrosis. PMH of aortic stenosis, afib, chronic kidney disease, chronig systolic CHF (congestive heart failure), COPD, Gout, HTN, panic attacks, spinal stenosis,    Clinical Impression   PTA, pt was independent with ADL and functional mobility and maintained an active lifestyle. Pt is currently highly impulsive and requiring min assist for toilet transfers and max assist for LB ADL. Pt only able to recall 2/3 hip precautions this session and required max VC's to attend to education this session. Pt lives with wife but family unsure if pt will have 24 hour assistance at home and are looking into this. Plan to complete education with wife tomorrow morning concerning methods to assist pt. Feel pt is unsafe at this time without 24 hour assistance and currently recommending short-term SNF placement for continued rehabilitation in order to improve independence and safety with ADL post-acute D/C. Pt insistent on returning home and wife reports that she understands why this is the recommendation but feels that Jesus Hendrix will not agree to this. Pt will need 24 hour assistance at home and maximum home health services (including OT and potentially an aide) if he is to go home. OT will continue to follow acutely with focus on pt and family education on LB ADL and safe ADL transfers.    Follow Up Recommendations  SNF;Supervision/Assistance - 24 hour    Equipment Recommendations  None recommended by OT (Has needs met; family is looking for [facility])    Recommendations for Other Services       Precautions / Restrictions Precautions Precautions: Posterior Hip;Fall Precaution Booklet Issued: Yes (comment) Precaution Comments: Reviewed handout provided by PT Required Braces or Orthoses: Knee Immobilizer -  Right Knee Immobilizer - Right: Other (comment) (on when in bed) Restrictions Weight Bearing Restrictions: Yes LLE Weight Bearing: Weight bearing as tolerated      Mobility Bed Mobility Overal bed mobility: Needs Assistance Bed Mobility: Sit to Supine;Supine to Sit     Supine to sit: Mod assist Sit to supine: Mod assist   General bed mobility comments: Mod assist to mobilize B LEs.  Transfers Overall transfer level: Needs assistance Equipment used: Rolling walker (2 wheeled) Transfers: Sit to/from Stand Sit to Stand: Min assist         General transfer comment: Pt is highly impulsive and unsafe without assist to complete transfers. Requires max VC's for safety and to manage RW.    Balance Overall balance assessment: Needs assistance Sitting-balance support: Feet supported;No upper extremity supported Sitting balance-Leahy Scale: Fair     Standing balance support: Bilateral upper extremity supported;During functional activity Standing balance-Leahy Scale: Poor Standing balance comment: Pt reliant on RW to maintain standing balance                            ADL Overall ADL's : Needs assistance/impaired     Grooming: Min guard;Minimal assistance;Standing   Upper Body Bathing: Sitting;Min guard   Lower Body Bathing: Maximal assistance;Sit to/from stand   Upper Body Dressing : Min guard;Sitting   Lower Body Dressing: Maximal assistance;Sit to/from stand   Toilet Transfer: Minimal assistance;Ambulation;RW   Toileting- Clothing Manipulation and Hygiene: Moderate assistance;Sit to/from stand       Functional mobility during ADLs: Min guard;Minimal assistance;Rolling walker General ADL Comments: Pt  requires max VC's to adhere to education topics. Pt and family educated on posterior hip precautions, safety awareness, ADL techniques, as well as need for 24 hour assistance at home.     Vision Vision Assessment?: No apparent visual deficits    Perception     Praxis      Pertinent Vitals/Pain Pain Assessment: 0-10 Pain Score: 5  Pain Location: L hip Pain Descriptors / Indicators: Aching;Sore Pain Intervention(s): Limited activity within patient's tolerance;Monitored during session;Repositioned     Hand Dominance Right   Extremity/Trunk Assessment Upper Extremity Assessment Upper Extremity Assessment: Overall WFL for tasks assessed   Lower Extremity Assessment Lower Extremity Assessment: Defer to PT evaluation       Communication Communication Communication: No difficulties   Cognition Arousal/Alertness: Awake/alert Behavior During Therapy: WFL for tasks assessed/performed Overall Cognitive Status: Impaired/Different from baseline Area of Impairment: Safety/judgement;Following commands;Awareness       Following Commands: Follows multi-step commands inconsistently (Pt requires max cues to listen to education) Safety/Judgement: Decreased awareness of safety;Decreased awareness of deficits Awareness: Emergent   General Comments: At beginning and throughout session, pt making off colored jokes and inappropriate toward family and therapist. Did not subside with encouragement to stop. Pt highly impulsive throughout session and required max cues to attend to education as pt will talk over therapist or change the subject. Pt's daughter reports that this happens with pain medications. Pt demonstrates much decreased awareness of safety as well as decreased ability to make safe choices.   General Comments       Exercises       Shoulder Instructions      Home Living Family/patient expects to be discharged to:: Private residence Living Arrangements: Spouse/significant other Available Help at Discharge: Family;Friend(s);Available 24 hours/day Type of Home: House Home Access: Stairs to enter [employer] of Steps: 2 Entrance Stairs-Rails: Can reach both Home Layout: One level     Bathroom Shower/Tub:  Occupational psychologist: Standard     Home Equipment: Mining engineer - 2 wheels;Shower seat;Bedside commode          Prior Functioning/Environment Level of Independence: Independent (occasionally with cane)                 OT Problem List: Decreased strength;Decreased range of motion;Decreased activity tolerance;Impaired balance (sitting and/or standing);Decreased safety awareness;Decreased knowledge of use of DME or AE;Decreased knowledge of precautions;Pain   OT Treatment/Interventions: Self-care/ADL training;Therapeutic exercise;DME and/or AE instruction;Therapeutic activities;Cognitive remediation/compensation;Patient/family education;Balance training;Energy conservation    OT Goals(Current goals can be found in the care plan section) Acute Rehab OT Goals Patient Stated Goal: to go back to work OT Goal Formulation: With patient/family Time For Goal Achievement: 12/27/16 Potential to Achieve Goals: Good ADL Goals Pt Will Perform Grooming: with modified independence;standing Pt Will Perform Lower Body Bathing: with modified independence;with adaptive equipment;sit to/from stand Pt Will Perform Lower Body Dressing: with modified independence;with adaptive equipment;sit to/from stand Pt Will Transfer to Toilet: with modified independence;ambulating;bedside commode Pt Will Perform Toileting - Clothing Manipulation and hygiene: with modified independence;sit to/from stand Pt Will Perform Tub/Shower Transfer: with modified independence;shower seat;3 in 1;ambulating;rolling walker  OT Frequency: Min 2X/week   Barriers to D/C:            Co-evaluation              End of Session Equipment Utilized During Treatment: Gait belt;Rolling walker  Activity Tolerance: Patient tolerated treatment well Patient left: in bed;with call bell/phone within reach;with family/visitor present  Time: 7737-3668 OT Time Calculation (min): 38 min Charges:  OT General  Charges $OT Visit: 1 Procedure OT Evaluation $OT Eval Moderate Complexity: 1 Procedure OT Treatments $Self Care/Home Management : 23-37 mins  Norman Herrlich, OTR/L [phone] 12/20/2016, 2:24 PM

## 2016-12-20 NOTE — Progress Notes (Signed)
Physical Therapy Treatment Patient Details Name: KEVIN SPACE MRN: [id number] DOB: 04/26/1946 Today's Date: 12/20/2016    History of Present Illness Pt with L THA on 12/19/2016 2/2 diagnosis of L hip avascular necrosis. PMH of aortic stenosis, afib, chronic kidney disease, chronig systolic CHF (congestive heart failure), COPD, Gout, HTN, panic attacks, spinal stenosis,     PT Comments    Pt with increased activity tolerance today. Pt able to ambulate 150 ft with min guard for safety. Pt demonstrated unsteady, antalgic gait and required cues for even weight shift between LEs and trunk upright. Pt gait improved with cues to decrease gait speed as antalgia became more apparent as pt increased his speed. Pt would benefit from continual reinforcement of posterior hip precautions as his behavior is slightly impulsive. Pt continues to be an appropriate candidate for HHPT to address deficits in mobility and strength before return to baseline function. PT will continue to follow acutely.   Follow Up Recommendations  Home health PT;Supervision/Assistance - 24 hour     Equipment Recommendations  3in1 (PT)    Recommendations for Other Services       Precautions / Restrictions Precautions Precautions: Posterior Hip;Fall Precaution Booklet Issued: Yes (comment) Precaution Comments: Reviewed and given handout Required Braces or Orthoses: Knee Immobilizer - Right Knee Immobilizer - Right: Other (comment) (on in bed) Restrictions Weight Bearing Restrictions: Yes LLE Weight Bearing: Weight bearing as tolerated    Mobility  Bed Mobility Overal bed mobility: Needs Assistance Bed Mobility: Supine to Sit     Supine to sit: Min assist     General bed mobility comments: Min assist to upright trunk and LE mobility OOB. VC for sequencing and hand placement. Fair compliance with posterior hip precautions  Transfers Overall transfer level: Needs assistance Equipment used: Rolling walker (2  wheeled) Transfers: Sit to/from Stand Sit to Stand: Min assist         General transfer comment: Min assist to power to stand and for safety. Pt reports that he ambulated to bathroom and fell back onto toilet after feelings of lightheadedness earlier toay. No reports of lightheadedness/dizziness during our session.  Ambulation/Gait Ambulation/Gait assistance: Min guard Ambulation Distance (Feet): 150 Feet Assistive device: Rolling walker (2 wheeled) Gait Pattern/deviations: Step-through pattern;Decreased stance time - left;Decreased step length - left;Decreased weight shift to left;Antalgic;Trunk flexed Gait velocity: decreased Gait velocity interpretation: Below normal speed for age/gender General Gait Details: unsteady antalgic gait. Cued pt to slow down today 2/2 worsening pronounced antalgia with increased speed. Verbal cues for trunk upright.    Stairs            Wheelchair Mobility    Modified Rankin (Stroke Patients Only)       Balance Overall balance assessment: Needs assistance Sitting-balance support: Feet supported;No upper extremity supported Sitting balance-Leahy Scale: Fair     Standing balance support: Bilateral upper extremity supported;During functional activity Standing balance-Leahy Scale: Poor Standing balance comment: Pt reliant on RW to maintain standing balance                    Cognition Arousal/Alertness: Awake/alert Behavior During Therapy: WFL for tasks assessed/performed Overall Cognitive Status: Within Functional Limits for tasks assessed                      Exercises Total Joint Exercises Long Arc Quad: AROM;10 reps;Left;Seated Marching in Standing: AROM;Both;15 reps;Seated    General Comments        Pertinent Vitals/Pain Pain Assessment:  0-10 Pain Score: 10-Worst pain ever Pain Location: L hip Pain Descriptors / Indicators: Aching Pain Intervention(s): Limited activity within patient's tolerance;Monitored  during session;Repositioned;Ice applied;RN gave pain meds during session    Home Living                      Prior Function            PT Goals (current goals can now be found in the care plan section) Acute Rehab PT Goals Patient Stated Goal: to go back to work PT Goal Formulation: With patient Time For Goal Achievement: 01/02/17 Potential to Achieve Goals: Fair Progress towards PT goals: Progressing toward goals    Frequency    7X/week      PT Plan Current plan remains appropriate    Co-evaluation             End of Session Equipment Utilized During Treatment: Gait belt Activity Tolerance: Patient tolerated treatment well Patient left: in chair;with call bell/phone within reach;with family/visitor present     Time: 0852-0926 PT Time Calculation (min) (ACUTE ONLY): 34 min  Charges:  $Gait Training: 8-22 mins $Therapeutic Exercise: 8-22 mins                    G Codes:      Tonia Brooms Jan 01, 2017, 10:06 AM Tonia Brooms, SPT [phone]

## 2016-12-20 NOTE — Progress Notes (Signed)
[name]        MRN:  [id number]          DOB/AGE: Jul 13, 1946 / 71 y.o.    Joni Fears, MD   Biagio Borg, PA-C [address] [place], [place]  [address]                             [phone]   PROGRESS NOTE  Subjective:  negative for Chest Pain  negative for Shortness of Breath  negative for Nausea/Vomiting   negative for Calf Pain    Tolerating Diet: yes         Patient reports pain as mild and moderate.       Objective: Vital signs in last 24 hours:   Patient Vitals for the past 24 hrs:  BP Temp Temp src Pulse Resp SpO2 Weight  12/20/16 0648 (!) 101/57 98.2 F (36.8 C) Oral 66 16 94 % -  12/20/16 0105 112/62 98.3 F (36.8 C) Oral (!) 58 16 95 % -  12/19/16 2204 119/60 98.2 F (36.8 C) Oral 67 16 98 % -  12/19/16 1700 125/70 97.2 F (36.2 C) Oral 70 - 98 % -  12/19/16 1510 127/72 97.3 F (36.3 C) - 70 - 97 % -  12/19/16 1445 105/83 97.9 F (36.6 C) - 77 19 98 % -  12/19/16 1430 114/63 - - 62 15 100 % -  12/19/16 1415 111/61 - - 62 10 96 % -  12/19/16 1400 (!) 117/101 - - (!) 113 19 97 % -  12/19/16 1345 - - - (!) 52 14 99 % -  12/19/16 1342 110/74 - - (!) 58 10 97 % -  12/19/16 1330 112/66 - - (!) 54 14 100 % -  12/19/16 1327 - - - (!) 46 11 100 % -  12/19/16 1300 (!) 87/57 - - 63 13 99 % -  12/19/16 1245 (!) 99/57 - - (!) 48 13 97 % -  12/19/16 1230 - - - (!) 59 14 98 % -  12/19/16 1227 99/65 97.9 F (36.6 C) - 63 12 99 % -  12/19/16 0806 136/79 97.6 F (36.4 C) Oral 71 18 98 % 215 lb (97.5 kg)      Intake/Output from previous day:   01/02 0701 - 01/03 0700 In: 1580 [P.O.:180; I.V.:1000] Out: 1000 [Urine:700]   Intake/Output this shift:   No intake/output data recorded.   Intake/Output      01/02 0701 - 01/03 0700 01/03 0701 - 01/04 0700   P.O. 180    I.V. (mL/kg) 1000 (10.3)    IV Piggyback 400    Total Intake(mL/kg) 1580 (16.2)    Urine (mL/kg/hr) 700    Blood 300    Total Output 1000     Net +580              LABORATORY DATA:  Recent Labs  12/19/16 1620  WBC 10.6*  HGB 12.1*  HCT 35.9*  PLT 262    Recent Labs  12/19/16 0752 12/19/16 1620  NA 137  --   K 4.1  --   CL 102  --   CO2 27  --   BUN 25*  --   CREATININE 1.41* 1.36*  GLUCOSE 120*  --   CALCIUM 9.3  --    Lab Results  Component Value Date   INR 1.01 12/19/2016   INR 2.24 12/07/2016  INR 2.3 11/22/2016    Recent Radiographic Studies :  Dg Chest 2 View  Result Date: 12/19/2016 CLINICAL DATA:  Preop left total hip.  Mild chest congestion. EXAM: CHEST  2 VIEW COMPARISON:  Two-view chest x-ray a 06/06/2016. FINDINGS: The heart size is normal. Median sternotomy for CABG is again noted. Emphysematous changes are present. Atherosclerotic calcifications are present at the aortic arch. No focal airspace disease is present. There is no edema or effusion. Degenerate changes of the thoracic spine and mild rightward curvature are stable. IMPRESSION: 1. No acute cardiopulmonary disease. 2. Emphysema. 3. Aortic atherosclerosis. Electronically Signed   By: San Morelle M.D.   On: 12/19/2016 08:05   Dg Pelvis 1-2 Views  Result Date: 12/19/2016 CLINICAL DATA:  Preop left total hip replacement EXAM: PELVIS - 1-2 VIEW COMPARISON:  CT 10/14/2008 FINDINGS: Prior right hip replacement. Probable changes of avascular necrosis in the left femoral head with sclerosis and flattening. Advanced degenerative changes in the left hip. No acute bony abnormality. Specifically, no fracture, subluxation, or dislocation. Soft tissues are intact. 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 Prior right hip replacement. No acute findings. Electronically Signed   By: Rolm Baptise M.D.   On: 12/19/2016 09:59   Dg Hip Port Unilat With Pelvis 1v Left  Result Date: 12/19/2016 CLINICAL DATA:  Status post left total hip arthroplasty. EXAM: DG HIP (WITH OR WITHOUT PELVIS) 1V PORT LEFT COMPARISON:   Postoperative left hip radiograph of today's date FINDINGS: The patient has undergone left total hip arthroplasty placement. Radiographic positioning of prosthetic components is good. The cross-table lateral view is limited. IMPRESSION: No immediate complication following left total hip joint prosthesis placement. The pre-existing right hip prosthesis is unremarkable. Electronically Signed   By: David  Martinique M.D.   On: 12/19/2016 14:43     Examination:  General appearance: alert, cooperative, mild distress and moderate distress Resp: clear to auscultation bilaterally Cardio: regular rate and rhythm GI: normal findings: bowel sounds normal  Wound Exam: clean, dry, intact dressing  Drainage:  None: wound tissue dry  Motor Exam: EHL, FHL, Anterior Tibial and Posterior Tibial Intact  Sensory Exam: Superficial Peroneal, Deep Peroneal and Tibial normal  Vascular Exam: Left posterior tibial artery has trace pulse  Assessment:    1 Day Post-Op  Procedure(s) (LRB): LEFT TOTAL HIP ARTHROPLASTY (Left)  ADDITIONAL DIAGNOSIS:  Principal Problem:   Avascular necrosis of bone of hip, left (HCC) Active Problems:   Status post total replacement of left hip     Plan: Physical Therapy as ordered Weight Bearing as Tolerated (WBAT)  DVT Prophylaxis:  Lovenox, Coumadin, Foot Pumps and TED hose  DISCHARGE PLAN: Home  DISCHARGE NEEDS: HHPT        Biagio Borg  12/20/2016 7:59 AM

## 2016-12-21 LAB — PROTIME-INR
INR: 1.12
Prothrombin Time: 14.5 seconds (ref 11.4–15.2)

## 2016-12-21 LAB — BASIC METABOLIC PANEL
ANION GAP: 8 (ref 5–15)
BUN: 17 mg/dL (ref 6–20)
CHLORIDE: 98 mmol/L — AB (ref 101–111)
CO2: 26 mmol/L (ref 22–32)
Calcium: 8.7 mg/dL — ABNORMAL LOW (ref 8.9–10.3)
Creatinine, Ser: 1.37 mg/dL — ABNORMAL HIGH (ref 0.61–1.24)
GFR calc Af Amer: 58 mL/min — ABNORMAL LOW (ref >60)
GFR, EST NON AFRICAN AMERICAN: 50 mL/min — AB (ref >60)
Glucose, Bld: 145 mg/dL — ABNORMAL HIGH (ref 65–99)
POTASSIUM: 3.9 mmol/L (ref 3.5–5.1)
Sodium: 132 mmol/L — ABNORMAL LOW (ref 135–145)

## 2016-12-21 LAB — CBC
HEMATOCRIT: 33.4 % — AB (ref 39.0–52.0)
HEMOGLOBIN: 11.3 g/dL — AB (ref 13.0–17.0)
MCH: 31.1 pg (ref 26.0–34.0)
MCHC: 33.8 g/dL (ref 30.0–36.0)
MCV: 92 fL (ref 78.0–100.0)
Platelets: 248 10*3/uL (ref 150–400)
RBC: 3.63 MIL/uL — ABNORMAL LOW (ref 4.22–5.81)
RDW: 14.3 % (ref 11.5–15.5)
WBC: 8.3 10*3/uL (ref 4.0–10.5)

## 2016-12-21 MED ORDER — ENOXAPARIN SODIUM 40 MG/0.4ML ~~LOC~~ SOLN: 40.0000 mg | SUBCUTANEOUS | 1 refills | Status: DC

## 2016-12-21 MED ORDER — METHOCARBAMOL 500 MG PO TABS: 500.0000 mg | ORAL_TABLET | Freq: Three times a day (TID) | ORAL | 0 refills | Status: DC | PRN

## 2016-12-21 MED ORDER — ACETAMINOPHEN 325 MG PO TABS
650.0000 mg | ORAL_TABLET | ORAL | Status: DC | PRN
  Administered 2016-12-21: 650 mg via ORAL
  Filled 2016-12-21: qty 2

## 2016-12-21 MED ORDER — OXYCODONE HCL 5 MG PO TABS: 5.0000 mg | ORAL_TABLET | ORAL | 0 refills | Status: DC | PRN

## 2016-12-21 NOTE — Addendum Note (Signed)
Addendum  created 12/21/16 1920 by Lillia Abed, MD   Sign clinical note

## 2016-12-21 NOTE — Progress Notes (Signed)
ANTICOAGULATION CONSULT NOTE - FOLLOW UP  Pharmacy Consult:  Coumadin Indication: atrial fibrillation  No Known Allergies  Patient Measurements: Weight: 215 lb (97.5 kg)  Vital Signs: Temp: 99.6 F (37.6 C) (01/04 0554) Temp Source: Oral (01/04 0554) BP: 124/56 (01/04 0500) Pulse Rate: 93 (01/04 0500)  Labs:  Recent Labs  12/19/16 0752  12/19/16 1620 12/20/16 0651 12/21/16 0812  HGB  --   < > 12.1* 11.2* 11.3*  HCT  --   --  35.9* 33.5* 33.4*  PLT  --   --  262 239 248  LABPROT 13.3  --   --  14.9 14.5  INR 1.01  --   --  1.16 1.12  CREATININE 1.41*  --  1.36* 1.35* 1.37*  < > = values in this interval not displayed.  Estimated Creatinine Clearance: 54.3 mL/min (by C-G formula based on SCr of 1.37 mg/dL (H)).    Assessment: 70 YOM on Coumadin PTA for history of Afib.  Now s/p left THR and Coumadin resumed in addition to prophylactic Lovenox.  INR sub-therapeutic as expected.  No bleeding reported.  PTA dose = 5mg  daily except 7.5mg  TThSun   Goal of Therapy:  INR 2-3    Plan:  - Coumadin 10mg  PO x 1 tonight if not dicharged - Lovenox 40mg  SQ Q24H until INR >/= 1.8 - Daily INR   Alta Shober D. Mina Marble, PharmD, BCPS Pager:  [phone] 12/21/2016, 11:30 AM

## 2016-12-21 NOTE — Discharge Summary (Signed)
Joni Fears, MD   Biagio Borg, PA-C [address] [place], [place]  [address]                             [phone]  [name]        MRN:  [id number]          DOB/AGE: Jan 21, 1946 / 71 y.o.    DISCHARGE SUMMARY  ADMISSION DATE:    12/19/2016 DISCHARGE DATE:   12/21/2016   ADMISSION DIAGNOSIS: LEFT HIP AVASCULAR NECROSIS    DISCHARGE DIAGNOSIS:  LEFT HIP AVASCULAR NECROSIS    ADDITIONAL DIAGNOSIS: Principal Problem:   Avascular necrosis of bone of hip, left (HCC) Active Problems:   Status post total replacement of left hip  Past Medical History:  Diagnosis Date  . Anxiety   . Aortic stenosis    a. mild on echo 04/2014  . Atrial fibrillation ([place]) 06/06/2016   a. newly diagnosed 06/06/2016; b. CHADS2VASc => 5 (CHF, HTN, age x 1, DM, vascular disease)  . Choledocholithiasis   . Chronic kidney disease   . Chronic systolic CHF (congestive heart failure) ([place])    a. echo 04/2014: EF 45-50%, nl LV dia fxn, mild AS, LA mildly dilated, PASP nl  . COPD (chronic obstructive pulmonary disease) ([place])    Not on home oxygen  . Coronary artery disease    a. status post CABG in 2007; b. nuc stress test 2014: small region of mild ischemia in the inferior territory, EF 61%, no ischemia, scan similar to prior scan in 2009  . Exogenous obesity   . GERD (gastroesophageal reflux disease)   . Gout   . Hiatal hernia   . Hip pain, chronic 08/03/2016   Waiting to have hip replacement pending NM stress test results per patient.  . History of colonic diverticulitis   . History of renal cell carcinoma    a. s/p laser ablation   . Hyperlipidemia   . Hypertension   . Near syncope    a. 3 separate events in 2017  . Nephrolithiasis   . Panic attacks   . Spinal stenosis   . Temporomandibular joint pain dysfunction syndrome   . Umbilical hernia   . Vitamin D deficiency   . Wegener's granulomatosis with renal involvement ([place])     PROCEDURE: Procedure(s): LEFT TOTAL HIP  ARTHROPLASTY Left on 12/19/2016  CONSULTS: none    HISTORY:  Maze is a 71 year old white male who is seen today for evaluation of his left groin pain which appeared previously diagnosed as avascular necrosis. He has had a previous right total hip replacement for avascular necrosis. He now has AVN of his left hip which is progressively worsening with pain and discomfort. He has nighttime pain as well as rest pain. He has difficulty with any activities of daily living. He also has significant lumbar spine disease and has been treated previously with Dr. Ernestina Patches with ESI. However approximate 8 months ago he started having worsening symptoms.He still continues to work, doing Psychologist, forensic work, and he states that certainly having to jump over ditches and that, he has had problems with severe pain in the groin. He has had a previous right total hip replacement for AVN. He has also had multiple problems where he was taking a drug called CellCept, which caused a renal carcinoma and he had to have a left nephrectomy. He comes in today, though, complaining of pain more in the left  groin and a limp. It has not affected his ability to perform activities of daily living as well as his job. Nighttime pain. Pain with every step. Antalgic limp is noted by him. He cannot take this pain anymore. Radiographs from 04/04/2016 revealed what appears to be AVN with a fragmentation in the fovea noted on the pelvis. Seen today for evaluation.   HOSPITAL COURSE:  Jesus Hendrix is a 71 y.o. admitted on 12/19/2016 and found to have a diagnosis of LEFT HIP AVASCULAR NECROSIS.  After appropriate laboratory studies were obtained  they were taken to the operating room on 12/19/2016 and underwent  Procedure(s): LEFT TOTAL HIP ARTHROPLASTY  Left.   They were given perioperative antibiotics:  Anti-infectives    Start     Dose/Rate Route Frequency Ordered Stop   12/19/16 1645  ceFAZolin (ANCEF) IVPB 1 g/50 mL premix     1 g 100 mL/hr over 30  Minutes Intravenous Every 6 hours 12/19/16 1540 12/19/16 2136   12/19/16 0930  ceFAZolin (ANCEF) IVPB 2g/100 mL premix     2 g 200 mL/hr over 30 Minutes Intravenous On call to O.R. 12/18/16 1884 12/19/16 1044    .  Tolerated the procedure well.  Toradol was given post op.  POD #1, allowed out of bed to a chair.  PT for ambulation and exercise program.  IV saline locked.  O2 discontionued.  POD #2, continued PT and ambulation.  .  The remainder of the hospital course was dedicated to ambulation and strengthening.   The patient was discharged on 2 Days Post-Op in  Stable condition.  Blood products given:none  DIAGNOSTIC STUDIES: Recent vital signs:  Patient Vitals for the past 24 hrs:  BP Temp Temp src Pulse Resp SpO2  12/21/16 0554 - 99.6 F (37.6 C) Oral - - -  12/21/16 0500 (!) 124/56 100.3 F (37.9 C) Axillary 93 15 92 %  12/20/16 2028 (!) 148/58 99.8 F (37.7 C) Oral 84 - 94 %  12/20/16 1700 (!) 145/73 99.3 F (37.4 C) Oral 85 - 98 %  12/20/16 1427 113/61 98.6 F (37 C) Oral - 16 96 %  12/20/16 1300 (!) 144/87 - - - - -       Recent laboratory studies:  Recent Labs  12/19/16 1620 12/20/16 0651 12/21/16 0812  WBC 10.6* 6.5 8.3  HGB 12.1* 11.2* 11.3*  HCT 35.9* 33.5* 33.4*  PLT 262 239 248    Recent Labs  12/19/16 0752 12/19/16 1620 12/20/16 0651 12/21/16 0812  NA 137  --  138 132*  K 4.1  --  4.3 3.9  CL 102  --  103 98*  CO2 27  --  27 26  BUN 25*  --  21* 17  CREATININE 1.41* 1.36* 1.35* 1.37*  GLUCOSE 120*  --  115* 145*  CALCIUM 9.3  --  8.5* 8.7*   Lab Results  Component Value Date   INR 1.12 12/21/2016   INR 1.16 12/20/2016   INR 1.01 12/19/2016     Recent Radiographic Studies :  Dg Chest 2 View  Result Date: 12/19/2016 CLINICAL DATA:  Preop left total hip.  Mild chest congestion. EXAM: CHEST  2 VIEW COMPARISON:  Two-view chest x-ray a 06/06/2016. FINDINGS: The heart size is normal. Median sternotomy for CABG is again noted. Emphysematous  changes are present. Atherosclerotic calcifications are present at the aortic arch. No focal airspace disease is present. There is no edema or effusion. Degenerate changes of the thoracic spine  and mild rightward curvature are stable. IMPRESSION: 1. No acute cardiopulmonary disease. 2. Emphysema. 3. Aortic atherosclerosis. Electronically Signed   By: San Morelle M.D.   On: 12/19/2016 08:05   Dg Pelvis 1-2 Views  Result Date: 12/19/2016 CLINICAL DATA:  Preop left total hip replacement EXAM: PELVIS - 1-2 VIEW COMPARISON:  CT 10/14/2008 FINDINGS: Prior right hip replacement. Probable changes of avascular necrosis in the left femoral head with sclerosis and flattening. Advanced degenerative changes in the left hip. No acute bony abnormality. Specifically, no fracture, subluxation, or dislocation. Soft tissues are intact. 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 Prior right hip replacement. No acute findings. Electronically Signed   By: Rolm Baptise M.D.   On: 12/19/2016 09:59   Dg Hip Port Unilat With Pelvis 1v Left  Result Date: 12/19/2016 CLINICAL DATA:  Status post left total hip arthroplasty. EXAM: DG HIP (WITH OR WITHOUT PELVIS) 1V PORT LEFT COMPARISON:  Postoperative left hip radiograph of today's date FINDINGS: The patient has undergone left total hip arthroplasty placement. Radiographic positioning of prosthetic components is good. The cross-table lateral view is limited. IMPRESSION: No immediate complication following left total hip joint prosthesis placement. The pre-existing right hip prosthesis is unremarkable. Electronically Signed   By: David  Martinique M.D.   On: 12/19/2016 14:43    DISCHARGE INSTRUCTIONS: Discharge Instructions    Call MD / Call 911    Complete by:  As directed    If you experience chest pain or shortness of breath, CALL 911 and be transported to the hospital emergency room.  If you develope a fever above 101 F,  pus (white drainage) or increased drainage or redness at the wound, or calf pain, call your surgeon's office.   Change dressing    Complete by:  As directed    DO NOT CHANGE THE DRESSING   Constipation Prevention    Complete by:  As directed    Drink plenty of fluids.  Prune juice may be helpful.  You may use a stool softener, such as Colace (over the counter) 100 mg twice a day.  Use MiraLax (over the counter) for constipation as needed.   Diet general    Complete by:  As directed    Discharge instructions    Complete by:  As directed    [place] items at home which could result in a fall. This includes throw rugs or furniture in walking pathways ICE to the affected joint every three hours while awake for 30 minutes at a time, for at least the first 3-5 days, and then as needed for pain and swelling.  Continue to use ice for pain and swelling. You may notice swelling that will progress down to the foot and ankle.  This is normal after surgery.  Elevate your leg when you are not up walking on it.   Continue to use the breathing machine you got in the hospital (incentive spirometer) which will help keep your temperature down.  It is common for your temperature to cycle up and down following surgery, especially at night when you are not up moving around and exerting yourself.  The breathing machine keeps your lungs expanded and your temperature down.   DIET:  As you were doing prior to hospitalization, we recommend a well-balanced diet.  DRESSING / WOUND CARE / SHOWERING  Keep the surgical dressing until follow up.  The dressing is water proof, so you can  shower without any extra covering.  IF THE DRESSING FALLS OFF or the wound gets wet inside, change the dressing with sterile gauze.  Please use good hand washing techniques before changing the dressing.  Do not use any lotions or creams on the incision until instructed by your surgeon.     ACTIVITY  Increase activity slowly as tolerated, but follow the weight bearing instructions below.   No driving for 6 weeks or until further direction given by your physician.  You cannot drive while taking narcotics.  No lifting or carrying greater than 10 lbs. until further directed by your surgeon. Avoid periods of inactivity such as sitting longer than an hour when not asleep. This helps prevent blood clots.  You may return to work once you are authorized by your doctor.     WEIGHT BEARING   Weight bearing as tolerated with assist device (walker, cane, etc) as directed, use it as long as suggested by your surgeon or therapist, typically at least 4-6 weeks.   EXERCISES  Results after joint replacement surgery are often greatly improved when you follow the exercise, range of motion and muscle strengthening exercises prescribed by your doctor. Safety measures are also important to protect the joint from further injury. Any time any of these exercises cause you to have increased pain or swelling, decrease what you are doing until you are comfortable again and then slowly increase them. If you have problems or questions, call your caregiver or physical therapist for advice.   Rehabilitation is important following a joint replacement. After just a few days of immobilization, the muscles of the leg can become weakened and shrink (atrophy).  These exercises are designed to build up the tone and strength of the thigh and leg muscles and to improve motion. Often times heat used for twenty to thirty minutes before working out will loosen up your tissues and help with improving the range of motion but do not use heat for the first two weeks following surgery (sometimes heat can increase post-operative swelling).   These exercises can be done on a training (exercise) mat, on a table or on a bed. Use whatever works the best and is most comfortable for you.    Use music or television while you are  exercising so that the exercises are a pleasant break in your day. This will make your life better with the exercises acting as a break in your routine that you can look forward to.   Perform all exercises about fifteen times, three times per day or as directed.  You should exercise both the operative leg and the other leg as well.   Exercises include:  Quad Sets - Tighten up the muscle on the front of the thigh (Quad) and hold for 5-10 seconds.   Straight Leg Raises - With your knee straight (if you were given a brace, keep it on), lift the leg to 60 degrees, hold for 3 seconds, and slowly lower the leg.  Perform this exercise against resistance later as your leg gets stronger.  Leg Slides: Lying on your back, slowly slide your foot toward your buttocks, bending your knee up off the floor (only go as far as is comfortable). Then slowly slide your foot back down until your leg is flat on the floor again.  Angel Wings: Lying on your back spread your legs to the side as far apart as you can without causing discomfort.  Hamstring Strength:  Lying on your back, push your heel  against the floor with your leg straight by tightening up the muscles of your buttocks.  Repeat, but this time bend your knee to a comfortable angle, and push your heel against the floor.  You may put a pillow under the heel to make it more comfortable if necessary.   A rehabilitation program following joint replacement surgery can speed recovery and prevent re-injury in the future due to weakened muscles. Contact your doctor or a physical therapist for more information on knee rehabilitation.    CONSTIPATION  Constipation is defined medically as fewer than three stools per week and severe constipation as less than one stool per week.  Even if you have a regular bowel pattern at home, your normal regimen is likely to be disrupted due to multiple reasons following surgery.  Combination of anesthesia, postoperative narcotics, change in  appetite and fluid intake all can affect your bowels.   YOU MUST use at least one of the following options; they are listed in order of increasing strength to get the job done.  They are all available over the counter, and you may need to use some, POSSIBLY even all of these options:    Drink plenty of fluids (prune juice may be helpful) and high fiber foods Colace 100 mg by mouth twice a day  Senokot for constipation as directed and as needed Dulcolax (bisacodyl), take with full glass of water  Miralax (polyethylene glycol) once or twice a day as needed.  If you have tried all these things and are unable to have a bowel movement in the first 3-4 days after surgery call either your surgeon or your primary doctor.    If you experience loose stools or diarrhea, hold the medications until you stool forms back up.  If your symptoms do not get better within 1 week or if they get worse, check with your doctor.  If you experience "the worst abdominal pain ever" or develop nausea or vomiting, please contact the office immediately for further recommendations for treatment.   ITCHING:  If you experience itching with your medications, try taking only a single pain pill, or even half a pain pill at a time.  You can also use Benadryl over the counter for itching or also to help with sleep.   TED HOSE STOCKINGS:  Use stockings on both legs until for at least 2 weeks or as directed by physician office. They may be removed at night for sleeping.  MEDICATIONS:  See your medication summary on the "After Visit Summary" that nursing will review with you.  You may have some home medications which will be placed on hold until you complete the course of blood thinner medication.  It is important for you to complete the blood thinner medication as prescribed.  PRECAUTIONS:  If you experience chest pain or shortness of breath - call 911 immediately for transfer to the hospital emergency department.   If you develop a  fever greater that 101 F, purulent drainage from wound, increased redness or drainage from wound, foul odor from the wound/dressing, or calf pain - CONTACT YOUR SURGEON.                                                   FOLLOW-UP APPOINTMENTS:  If you do not already have a post-op appointment, please call the office for an  appointment to be seen by your surgeon.  Guidelines for how soon to be seen are listed in your "After Visit Summary", but are typically between 1-4 weeks after surgery.  OTHER INSTRUCTIONS:   Knee Replacement:  Do not place pillow under knee, focus on keeping the knee straight while resting. CPM instructions: 0-90 degrees, 2 hours in the morning, 2 hours in the afternoon, and 2 hours in the evening. Place foam block, curve side up under heel at all times except when in CPM or when walking.  DO NOT modify, tear, cut, or change the foam block in any way.  MAKE SURE YOU:  Understand these instructions.  Get help right away if you are not doing well or get worse.    Thank you for letting us be a part of your medical care team.  It is a privilege we respect greatly.  We hope these instructions will help you stay on track for a fast and full recovery!   Driving restrictions    Complete by:  As directed    No driving for 6 weeks   Follow the hip precautions as taught in Physical Therapy    Complete by:  As directed    Increase activity slowly as tolerated    Complete by:  As directed    Lifting restrictions    Complete by:  As directed    No lifting for 6 weeks   Patient may shower    Complete by:  As directed    You may shower over the brown dressing   TED hose    Complete by:  As directed    Use stockings (TED hose) for 3 weeks on left leg.  You may remove them at night for sleeping.   Weight bearing as tolerated    Complete by:  As directed    Laterality:  left   Extremity:  Lower      DISCHARGE MEDICATIONS:   Allergies as of 12/21/2016   No Known Allergies      Medication List    STOP taking these medications   clonazePAM 1 MG tablet Commonly known as:  KLONOPIN     TAKE these medications   acetaminophen 325 MG tablet Commonly known as:  TYLENOL Take 650 mg by mouth 3 (three) times daily as needed for moderate pain.   atorvastatin 40 MG tablet Commonly known as:  LIPITOR Take 1 tablet (40 mg total) by mouth daily.   enoxaparin 40 MG/0.4ML injection Commonly known as:  LOVENOX Inject 0.4 mLs (40 mg total) into the skin daily. Until INR is greater than 2.0   fluticasone 50 MCG/ACT nasal spray Commonly known as:  FLONASE Place 2 sprays into both nostrils at bedtime.   furosemide 20 MG tablet Commonly known as:  LASIX Take 1 tablet (20 mg total) by mouth 2 (two) times daily as needed for fluid or edema. What changed:  how much to take  when to take this   losartan 50 MG tablet Commonly known as:  COZAAR Take 25 mg by mouth at bedtime.   methocarbamol 500 MG tablet Commonly known as:  ROBAXIN Take 1 tablet (500 mg total) by mouth every 8 (eight) hours as needed for muscle spasms.   metoprolol succinate 25 MG 24 hr tablet Commonly known as:  TOPROL-XL Take 1 tablet (25 mg total) by mouth daily.   niacin 500 MG CR tablet Commonly known as:  NIASPAN Take 500 mg by mouth daily.   oxyCODONE 5 MG  immediate release tablet Commonly known as:  Oxy IR/ROXICODONE Take 1-2 tablets (5-10 mg total) by mouth every 4 (four) hours as needed for breakthrough pain.   pantoprazole 40 MG tablet Commonly known as:  PROTONIX Take 40 mg by mouth daily.   tamsulosin 0.4 MG Caps capsule Commonly known as:  FLOMAX Take 0.4 mg by mouth at bedtime.   Vitamin D 2000 units Caps Take 2,000 Units by mouth daily.   warfarin 5 MG tablet Commonly known as:  COUMADIN Take as directed by [facility] What changed:  how much to take  how to take this  when to take this  additional instructions            Durable Medical Equipment         Start     Ordered   12/19/16 1541  DME Walker rolling  Once    Question:  Patient needs a walker to treat with the following condition  Answer:  Status post total hip replacement, left   12/19/16 1540   12/19/16 1541  DME 3 n 1  Once     12/19/16 1540   12/19/16 1541  DME Bedside commode  Once    Question:  Patient needs a bedside commode to treat with the following condition  Answer:  Status post total hip replacement, left   12/19/16 1540      FOLLOW UP VISIT:   Follow-up Information    Garald Balding, MD Follow up on 01/01/2017.   Specialty:  Orthopedic Surgery Contact information: [place] [address] [phone]        Garald Balding, MD .   Specialty:  Orthopedic Surgery Contact information: [place] [place] [place] [address] [phone]           DISPOSITION:   Home  CONDITION:  Stable   Mike Craze. [place], [place] [phone]  12/21/2016 10:41 AM

## 2016-12-21 NOTE — Progress Notes (Addendum)
Pt wife upset because she states that we are giving him something that is making him act (strange) I informed her that he has Klonopine  ordered. She states that he do not take that at home. The medication is listed on his medication list. I informed patient wife of this and that I would write a note and ask MD to discuss with her. Will continue to monitor. Arthor Captain LPN

## 2016-12-21 NOTE — Care Management Note (Signed)
Case Management Note  Patient Details  Name: Jesus Hendrix MRN: [id number] Date of Birth: Jan 23, 1946  Subjective/Objective:  20 yr o9ld gentleman s/p left total hip arthroplasty.                  Action/Plan: Case manager spoke with patient's eife (he was sleeping), to discuss home health and DME needs. Patient was preoperatively setup with [facility], no changes. Wife states she has everything she needs accept a 3in1. CM has ordered 3in1. Wife states she is comfortable assisting patient at discharge, she has done this before with his other surgery.    Expected Discharge Date:   12/21/16               Expected Discharge Plan:  [place]  In-House Referral:  NA  Discharge planning Services  CM Consult  Post Acute Care Choice:  Durable Medical Equipment, Home Health Choice offered to:  Spouse  DME Arranged:  3-N-1 DME Agency:  [place]:  PT, RN [facility] Agency:  [facility] ([facility]  Status of Service:  Completed, signed off  If discussed at [employer], dates discussed:    Additional Comments:  Ninfa Meeker, RN 12/21/2016, 2:34 PM

## 2016-12-21 NOTE — NC FL2 (Signed)
[place] LEVEL OF CARE SCREENING TOOL     IDENTIFICATION  Patient Name: Jesus Hendrix Birthdate: [birth year] Sex: male Admission Date (Current Location): 12/19/2016  [facility] and [place] Number:  Herbalist and Address:  [facility] [place], [place] [address], [place], [place] [address]      Provider Number: [id number]  Attending Physician Name and Address:  Garald Balding, MD  Relative Name and Phone Number:       Current Level of Care: Hospital Recommended Level of Care: [place] Prior Approval Number:    Date Approved/Denied: 12/21/16 PASRR Number: [id number]  Discharge Plan: SNF    Current Diagnoses: Patient Active Problem List   Diagnosis Date Noted  . Avascular necrosis of bone of hip, left ([place]) 12/19/2016  . Status post total replacement of left hip 12/19/2016  . Long term (current) use of anticoagulants [Z79.01] 08/09/2016  . New onset a-fib ([place]) 06/06/2016  . Anxiety 06/06/2016  . Hip pain 06/06/2016  . Shortness of breath   . Preop cardiovascular exam 11/15/2015  . Special screening for malignant neoplasms, colon   . Benign neoplasm of sigmoid colon   . Rectal polyp   . Mild aortic valve stenosis 07/13/2014  . Carotid stenosis 07/13/2014  . Near syncope 04/15/2014  . Murmur 04/15/2014  . Bruit 04/15/2014  . Bradycardia 04/15/2014  . LBBB (left bundle branch block) 04/15/2014  . First degree AV block 04/15/2014  . Wegener's granulomatosis ([place]) 05/23/2013  . CAD (coronary artery disease) 01/08/2012  . 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 Hyperlipidemia 01/08/2012  . HTN (hypertension) 01/08/2012    Orientation RESPIRATION BLADDER Height & Weight     Self, Time, Situation, Place  Normal Continent Weight: 215 lb (97.5 kg) Height:     BEHAVIORAL SYMPTOMS/MOOD NEUROLOGICAL BOWEL NUTRITION STATUS      Continent  (Please see discharge summary)  AMBULATORY STATUS COMMUNICATION OF NEEDS Skin   Extensive  Assist Verbally Surgical wounds (Closed incision right hip; adhesive bandage dressing)                       Personal Care Assistance Level of Assistance  Bathing, Dressing, Feeding Bathing Assistance: Maximum assistance Feeding assistance: Limited assistance Dressing Assistance: Maximum assistance     Functional Limitations Info  Sight, Hearing, Speech Sight Info: Adequate Hearing Info: Adequate Speech Info: Adequate    SPECIAL CARE FACTORS FREQUENCY  PT (By licensed PT), OT (By licensed OT)     PT Frequency: 7x week OT Frequency: 7x week            Contractures Contractures Info: Not present    Additional Factors Info  Code Status Code Status Info: Full Allergies Info: No known allergies           Current Medications (12/21/2016):  This is the current hospital active medication list Current Facility-Administered Medications  Medication Dose Route Frequency Provider Last Rate Last Dose  . acetaminophen (TYLENOL) tablet 650 mg  650 mg Oral Q4H PRN Cherylann Ratel, PA-C   650 mg at 12/21/16 1022  . alum & mag hydroxide-simeth (MAALOX/MYLANTA) 200-200-20 MG/5ML suspension 30 mL  30 mL Oral Q4H PRN Cherylann Ratel, PA-C      . atorvastatin (LIPITOR) tablet 40 mg  40 mg Oral Daily Mike Craze Petrarca, PA-C   40 mg at 12/21/16 1022  . bisacodyl (DULCOLAX) suppository 10 mg  10 mg Rectal Daily PRN Mike Craze  Petrarca, PA-C      . cholecalciferol (VITAMIN D) tablet 2,000 Units  2,000 Units Oral Daily Cherylann Ratel, PA-C   2,000 Units at 12/21/16 1021  . clonazePAM (KLONOPIN) tablet 1 mg  1 mg Oral BID Cherylann Ratel, PA-C   1 mg at 12/20/16 2234  . diphenhydrAMINE (BENADRYL) 12.5 MG/5ML elixir 12.5-25 mg  12.5-25 mg Oral Q4H PRN Mike Craze Petrarca, PA-C      . docusate sodium (COLACE) capsule 100 mg  100 mg Oral BID Mike Craze Petrarca, PA-C   100 mg at 12/21/16 1020  . enoxaparin (LOVENOX) injection 40 mg  40 mg Subcutaneous Q24H Mike Craze Petrarca, PA-C   40 mg at 12/21/16  1020  . fluticasone (FLONASE) 50 MCG/ACT nasal spray 2 spray  2 spray Each Nare QHS Cherylann Ratel, PA-C   2 spray at 12/20/16 2236  . furosemide (LASIX) tablet 40 mg  40 mg Oral Daily Mike Craze Petrarca, PA-C   40 mg at 12/21/16 1022  . HYDROmorphone (DILAUDID) injection 0.5-1 mg  0.5-1 mg Intravenous Q2H PRN Cherylann Ratel, PA-C   1 mg at 12/19/16 1605  . losartan (COZAAR) tablet 25 mg  25 mg Oral QHS Cherylann Ratel, PA-C   25 mg at 12/20/16 2234  . magnesium citrate solution 1 Bottle  1 Bottle Oral Once PRN Mike Craze Petrarca, PA-C      . magnesium hydroxide (MILK OF MAGNESIA) suspension 30 mL  30 mL Oral Daily PRN Cherylann Ratel, PA-C      . menthol-cetylpyridinium (CEPACOL) lozenge 3 mg  1 lozenge Oral PRN Cherylann Ratel, PA-C       Or  . phenol (CHLORASEPTIC) mouth spray 1 spray  1 spray Mouth/Throat PRN Cherylann Ratel, PA-C      . methocarbamol (ROBAXIN) tablet 500 mg  500 mg Oral Q6H PRN Cherylann Ratel, PA-C   500 mg at 12/19/16 1606   Or  . methocarbamol (ROBAXIN) 500 mg in dextrose 5 % 50 mL IVPB  500 mg Intravenous Q6H PRN Cherylann Ratel, PA-C      . metoCLOPramide (REGLAN) tablet 5-10 mg  5-10 mg Oral Q8H PRN Cherylann Ratel, PA-C       Or  . metoCLOPramide (REGLAN) injection 5-10 mg  5-10 mg Intravenous Q8H PRN Cherylann Ratel, PA-C      . metoprolol succinate (TOPROL-XL) 24 hr tablet 25 mg  25 mg Oral Daily Mike Craze Petrarca, PA-C   25 mg at 12/21/16 1022  . niacin (NIASPAN) CR tablet 500 mg  500 mg Oral Daily Mike Craze Petrarca, PA-C   500 mg at 12/21/16 1020  . ondansetron (ZOFRAN) tablet 4 mg  4 mg Oral Q6H PRN Cherylann Ratel, PA-C       Or  . ondansetron (ZOFRAN) injection 4 mg  4 mg Intravenous Q6H PRN Cherylann Ratel, PA-C   4 mg at 12/20/16 0808  . oxyCODONE (Oxy IR/ROXICODONE) immediate release tablet 5-10 mg  5-10 mg Oral Q3H PRN Cherylann Ratel, PA-C   10 mg at 12/20/16 0905  . pantoprazole (PROTONIX) EC tablet 40 mg  40 mg Oral Daily Cherylann Ratel,  PA-C   40 mg at 12/21/16 1020  . sodium chloride flush (NS) 0.9 % injection 3 mL  3 mL Intravenous Q12H Mike Craze Petrarca, PA-C   3 mL at 12/20/16 2237  . sodium chloride flush (NS) 0.9 % injection 3 mL  3 mL  Intravenous PRN Cherylann Ratel, PA-C      . tamsulosin [facility]) capsule 0.4 mg  0.4 mg Oral QHS Cherylann Ratel, PA-C   0.4 mg at 12/20/16 2234  . Warfarin - Pharmacist Dosing Inpatient   Does not apply [place], [facility]         Discharge Medications: Please see discharge summary for a list of discharge medications.  Relevant Imaging Results:  Relevant Lab Results:   Additional Information SSN: [id number]  Ht: 5\' 7"  (1.702 m) Wt: 215 lb (97.5 kg)   Robby Bulkley A [place], LCSW

## 2016-12-21 NOTE — Progress Notes (Signed)
Pt refused labs stated that they were not needed will inform day shift RN for reschedule. Arthor Captain LPN

## 2016-12-21 NOTE — Progress Notes (Signed)
12/21/16 1200  PT Visit Information  Last PT Received On 12/21/16  Assistance Needed +1  History of Present Illness Pt with L THA on 12/19/2016 2/2 diagnosis of L hip avascular necrosis. PMH of aortic stenosis, afib, chronic kidney disease, chronig systolic CHF (congestive heart failure), COPD, Gout, HTN, panic attacks, spinal stenosis,   Subjective Data  Subjective "I am so sleepy."  Patient Stated Goal to go back to work  Precautions  Precautions Posterior Hip;Fall  Precaution Booklet Issued Yes (comment)  Precaution Comments Reviewed handout provided by PT; pt needs reinforcement for precautions  Required Braces or Orthoses Knee Immobilizer - Right  Knee Immobilizer - Right Other (comment) (on when in bed)  Restrictions  Weight Bearing Restrictions Yes  LLE Weight Bearing WBAT  Pain Assessment  Pain Assessment 0-10  Pain Score 6  Pain Location L hip  Pain Descriptors / Indicators Aching;Sore  Pain Intervention(s) Limited activity within patient's tolerance;Monitored during session;Repositioned  Cognition  Arousal/Alertness Awake/alert  Behavior During Therapy WFL for tasks assessed/performed  Overall Cognitive Status Impaired/Different from baseline  Area of Impairment Safety/judgement;Following commands;Awareness  Following Commands Follows multi-step commands inconsistently;Follows one step commands with increased time (Pt requires max cues to listen to education)  Safety/Judgement Decreased awareness of safety;Decreased awareness of deficits  Awareness Emergent  General Comments Pt less impulsive throughout session today requiring only min cues to attend to education with pt listenting much better today. Pt's awareness of safety has improved slightly and wife demonstrated that she can cue pt and handle pt appropriately.    Bed Mobility  Overal bed mobility Needs Assistance  Bed Mobility Supine to Sit  Supine to sit Min assist  General bed mobility comments Min assist to  mobilize B LEs off bed to his right side (went to left side this am).  Pt was able to elevate trunk on his own but did use rail as he did this am.    Transfers  Overall transfer level Needs assistance  Equipment used Rolling walker (2 wheeled)  Transfers Sit to/from Stand  Sit to Stand Mod assist  General transfer comment Pt less impulsive but is still unsafe without assist. Requiresmod VC's for safety and to manage RW.  Pt needed assist to power up with heavy posterior lean noted.  Incr cues and time to get pt to get steady with RW.  Pt needs min assist with standing at all times as he loses balance without warning.  Wife aware and to purchase gait belt prior to departure from hospital.    Ambulation/Gait  Ambulation/Gait assistance Min assist;+2 safety/equipment  Ambulation Distance (Feet) 145 Feet  Assistive device Rolling walker (2 wheeled)  Gait Pattern/deviations Step-through pattern;Decreased stance time - left;Decreased step length - left;Decreased weight shift to left;Antalgic;Trunk flexed;Drifts right/left  General Gait Details unsteady antalgic gait. Cued pt to slow down today 2/2 worsening pronounced antalgia with increased speed. Verbal cues for trunk upright. Verbal cues for equal time spent on BLEs  Pt responded better to cues for sequencing steps and RW today with pt listening better and actually slowwing down.  Wife demonstrated she can assist pt with this as well with gait betlt.  Gait velocity decreased  Gait velocity interpretation Below normal speed for age/gender  Balance  Overall balance assessment Needs assistance  Sitting-balance support Feet supported;No upper extremity supported  Sitting balance-Leahy Scale Fair  Sitting balance - Comments Once pt go to EOB could sit without support but had a hard time getting to EOb.  Standing balance support Bilateral upper extremity supported;During functional activity  Standing balance-Leahy Scale Poor  Standing balance comment  Pt reliant on RW to maintain standing balance  PT - End of Session  Equipment Utilized During Treatment Gait belt  Activity Tolerance Patient tolerated treatment well  Patient left in chair;with call bell/phone within reach;with family/visitor present  Nurse Communication Mobility status  PT - Assessment/Plan  PT Plan Discharge plan needs to be updated  PT Frequency (ACUTE ONLY) 7X/week  Follow Up Recommendations Supervision/Assistance - 24 hour;Home health PT (wife states she will take pt home; SNF still recommended)  PT equipment 3in1 (PT)  PT Goal Progression  Progress towards PT goals Progressing toward goals  PT Time Calculation  PT Start Time (ACUTE ONLY) 1140  PT Stop Time (ACUTE ONLY) 1157  PT Time Calculation (min) (ACUTE ONLY) 17 min  PT General Charges  $$ ACUTE PT VISIT 1 Procedure  PT Treatments  $Gait Training 8-22 mins   Pt having more difficulty this pm with standing up from bed. Needed mod assist due to posterior  And right lateral lean.  Wife followed with chair as pt persisted with being sleepy.  Wife still adamant she is taking pt home.  Wife aware that PT recommends SNF with therapy to incr pts mobility but she still wants to do [facility] at home.  OT came to do treatment and will continue to talk to pt about recommendations.  OT to take wife to get the gait belt as wife unsure how to find gift shop.   [place] [phone] (pager)

## 2016-12-21 NOTE — Progress Notes (Signed)
Occupational Therapy Treatment Patient Details Name: Jesus Hendrix MRN: [id number] DOB: 1946/07/04 Today's Date: 12/21/2016    History of present illness Pt with L THA on 12/19/2016 2/2 diagnosis of L hip avascular necrosis. PMH of aortic stenosis, afib, chronic kidney disease, chronig systolic CHF (congestive heart failure), COPD, Gout, HTN, panic attacks, spinal stenosis,    OT comments  Pt progressing towards OT goals. Today's session focused on reinforcing posterior hip precautions, and working with caregivers to ensure Pt safety and caregiver safety upon dc to venue below. Pt able to follow single step directions, required max verbal cues for sequencing and safety. Pt and wife with no questions prior to d/c. At the end of the session, the daughter and granddaughter came in and OT went over precautions again. Pt and wife still decline/refuse SNF placement and feel that they will be better off at home. Pt and family received all education from OT.  Follow Up Recommendations  Home health OT;Supervision/Assistance - 24 hour    Equipment Recommendations  3 in 1 bedside commode    Recommendations for Other Services      Precautions / Restrictions Precautions Precautions: Posterior Hip;Fall Precaution Booklet Issued: Yes (comment) Precaution Comments: Reviewed handout provided by PT, Pt recalled 1/3 precautions, reviewed x3 with Pt and wife Required Braces or Orthoses: Knee Immobilizer - Right Knee Immobilizer - Right: Other (comment) (On when in bed) Restrictions Weight Bearing Restrictions: Yes LLE Weight Bearing: Weight bearing as tolerated       Mobility Bed Mobility Overal bed mobility: Needs Assistance Bed Mobility: Supine to Sit     Supine to sit: Min assist     General bed mobility comments: Pt sitting OOB when OT arrived in room  Transfers Overall transfer level: Needs assistance Equipment used: Rolling walker (2 wheeled) Transfers: Sit to/from Stand Sit to Stand:  Mod assist         General transfer comment: Pt required vc for maintaining precautions during transfer and cues for sequencing. Significant assist to power up    Balance Overall balance assessment: Needs assistance Sitting-balance support: Feet supported;No upper extremity supported Sitting balance-Leahy Scale: Fair Sitting balance - Comments: Once pt go to EOB could sit without support but had a hard time getting to EOb.    Standing balance support: Bilateral upper extremity supported;During functional activity Standing balance-Leahy Scale: Poor Standing balance comment: Pt reliant on RW to maintain standing balance during dressing                   ADL Overall ADL's : Needs assistance/impaired     Grooming: Minimal assistance;Standing;Wash/dry hands Grooming Details (indicate cue type and reason): sink level ADL; wife acting as caregiver         Upper Body Dressing : Min guard;Sitting;With caregiver independent assisting Upper Body Dressing Details (indicate cue type and reason): shirt and sweatshirt Lower Body Dressing: Maximal assistance;Sit to/from stand;With caregiver independent assisting Lower Body Dressing Details (indicate cue type and reason): underwear and pants, physical assist to stand required Toilet Transfer: Minimal assistance;Ambulation;RW;Adhering to hip precautions;Cueing for sequencing;With caregiver independent assisting (BSC over comfort height toilet) Toilet Transfer Details (indicate cue type and reason): Pt stood at toilet for approx 3 min attempting in and out catheter, Pt unable, and so sat on the [facility] over the toilet needing vc for safety with RW Toileting- Clothing Manipulation and Hygiene: Maximal assistance;With caregiver independent assisting;Sit to/from stand       Functional mobility during ADLs: Min guard;Minimal assistance;Cueing for  sequencing;Cueing for safety;Rolling walker;Caregiver able to provide necessary level of  assistance General ADL Comments: Pt requires max VC's to adhere to education topics (Pt is HOH as well). Pt and family educated on posterior hip precautions (x3 this session), family states they have 24 hour assistance at home, and had no further questions or concerns. Daughter states she will be staying with Pt and wife/mom at night.      Vision                     Perception     Praxis      Cognition   Behavior During Therapy: [facility] for tasks assessed/performed Overall Cognitive Status: Impaired/Different from baseline Area of Impairment: Safety/judgement;Following commands;Awareness        Following Commands: Follows multi-step commands inconsistently (requires max cues for safety and education) Safety/Judgement: Decreased awareness of safety;Decreased awareness of deficits Awareness: Emergent   General Comments: Pt with improved safety during today's session, and  verbalized why it is important to maintain precautions. Wife was able to cue him and provided all assist as he was getting dressed    Extremity/Trunk Assessment               Exercises     Shoulder Instructions       General Comments      Pertinent Vitals/ Pain       Pain Assessment: 0-10 Pain Score: 4  Pain Location: L hip Pain Descriptors / Indicators: Aching;Sore Pain Intervention(s): Monitored during session;Repositioned  Home Living                                          Prior Functioning/Environment              Frequency  Min 2X/week        Progress Toward Goals  OT Goals(current goals can now be found in the care plan section)  Progress towards OT goals: Progressing toward goals  Acute Rehab OT Goals 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 OT Goal Formulation: With patient/family Time For Goal Achievement: 12/27/16 Potential to Achieve Goals: Good  Plan Discharge plan needs to be updated;Equipment recommendations need to be  updated    Co-evaluation                 End of Session Equipment Utilized During Treatment: Gait belt;Rolling walker   Activity Tolerance Patient tolerated treatment well   Patient Left in chair;with call bell/phone within reach;with family/visitor present   Nurse Communication Mobility status        Time: 1155-1240 OT Time Calculation (min): 45 min  Charges: OT General Charges $OT Visit: 1 Procedure OT Treatments $Self Care/Home Management : 38-52 mins  Merri Ray Keni Wafer 12/21/2016, 2:20 PM  Hulda Humphrey OTR/L [phone]

## 2016-12-21 NOTE — Anesthesia Postprocedure Evaluation (Signed)
Anesthesia Post Note  Patient: Jesus Hendrix  Procedure(s) Performed: Procedure(s) (LRB): LEFT TOTAL HIP ARTHROPLASTY (Left)  Patient location during evaluation: PACU Anesthesia Type: MAC and Spinal Level of consciousness: oriented and awake and alert Pain management: pain level controlled Vital Signs Assessment: post-procedure vital signs reviewed and stable Respiratory status: spontaneous breathing, respiratory function stable and patient connected to nasal cannula oxygen Cardiovascular status: blood pressure returned to baseline and stable Postop Assessment: no headache and no backache Anesthetic complications: no       Last Vitals:  Vitals:   12/21/16 0554 12/21/16 1427  BP:  (!) 98/58  Pulse:  72  Resp:  18  Temp: 37.6 C     Last Pain:  Vitals:   12/21/16 1427  TempSrc: Oral  PainSc:                  [name]

## 2016-12-21 NOTE — Progress Notes (Signed)
Physical Therapy Treatment Patient .ptmed Name: Jesus STRANAHAN MRN: [id number] DOB: 1946-10-30 Today's Date: 12/21/2016    History of Present Illness Pt with L THA on 12/19/2016 2/2 diagnosis of L hip avascular necrosis. PMH of aortic stenosis, afib, chronic kidney disease, chronig systolic CHF (congestive heart failure), COPD, Gout, HTN, panic attacks, spinal stenosis,     PT Comments    Pt admitted with above diagnosis. Pt currently with functional limitations due to balance and endurance deficits. Pt was able to ambulate with better awareness of safety today with pt following more cues for safety today although he still needs 24 hour min assist.  Wife was able to demonstrate that she can cue and assist pt appropriately for all mobility to include bed, transfers, ambulation and stairs as well as exercise program.  Wife feels she can assist pt at home and will get gait belt so she can hold onto pt at all times.   Pt will benefit from skilled PT to increase their independence and safety with mobility to allow discharge to the venue listed below.    Follow Up Recommendations  Supervision/Assistance - 24 hour;Home health PT (wife states she will take pt home; SNF still recommended)     Equipment Recommendations  3in1 (PT)    Recommendations for Other Services       Precautions / Restrictions Precautions Precautions: Posterior Hip;Fall Precaution Booklet Issued: Yes (comment) Precaution Comments: Reviewed handout provided by PT; pt needs reinforcement for precautions Required Braces or Orthoses: Knee Immobilizer - Right Knee Immobilizer - Right: Other (comment) (on when in bed) Restrictions Weight Bearing Restrictions: Yes LLE Weight Bearing: Weight bearing as tolerated    Mobility  Bed Mobility Overal bed mobility: Needs Assistance Bed Mobility: Supine to Sit     Supine to sit: Mod assist     General bed mobility comments: Mod assist to mobilize B LEs off bed as well as pt needs  assist to elevate trunk. .  Transfers Overall transfer level: Needs assistance Equipment used: Rolling walker (2 wheeled) Transfers: Sit to/from Stand Sit to Stand: Min assist;Mod assist         General transfer comment: Pt less impulsive but is still unsafe without assist. Requiresmod VC's for safety and to manage RW.  Pt needed assist to power up each attempt with posterior lean noted.  Pt needs min assist with standing at all times as he loses balance without warning.  Wife aware and to purchase gait belt prior to departure from hospital.    Ambulation/Gait Ambulation/Gait assistance: Min assist Ambulation Distance (Feet): 100 Feet Assistive device: Rolling walker (2 wheeled) Gait Pattern/deviations: Step-through pattern;Decreased stance time - left;Decreased step length - left;Decreased weight shift to left;Antalgic;Trunk flexed;Drifts right/left Gait velocity: decreased Gait velocity interpretation: Below normal speed for age/gender General Gait Details: unsteady antalgic gait. Cued pt to slow down today 2/2 worsening pronounced antalgia with increased speed. Verbal cues for trunk upright. Verbal cues for equal time spent on BLEs  Pt responded better to cues for sequencing steps and RW today with pt listening better and actually slowwing down.  Wife demonstrated she can assist pt with this as well with gait betlt.   Stairs Stairs: Yes   Stair Management: Step to pattern;Backwards;With walker Number of Stairs: 4 General stair comments: Pt instructed to slow down prior to stair training. Pt and wife were able to demonstrate that he can slow down with her cuing and that she can cue for appropriate sequence.  Pt was able  to listen to wife and she assisted him well.  Required  verbal cues for step to pattern, sequencing of steps, and safety but pt and wife were able to accomplish without PT assist. Gave handout as well.   Wheelchair Mobility    Modified Rankin (Stroke Patients  Only)       Balance Overall balance assessment: Needs assistance Sitting-balance support: Feet supported;No upper extremity supported Sitting balance-Leahy Scale: Fair Sitting balance - Comments: Once pt go to EOB could sit without support but had a hard time getting to EOb.    Standing balance support: Bilateral upper extremity supported;During functional activity Standing balance-Leahy Scale: Poor Standing balance comment: Pt reliant on RW to maintain standing balance             High level balance activites: Direction changes;Turns High Level Balance Comments: min assist with above with RW    Cognition Arousal/Alertness: Awake/alert Behavior During Therapy: WFL for tasks assessed/performed Overall Cognitive Status: Impaired/Different from baseline Area of Impairment: Safety/judgement;Following commands;Awareness       Following Commands: Follows multi-step commands inconsistently;Follows one step commands with increased time (Pt requires max cues to listen to education) Safety/Judgement: Decreased awareness of safety;Decreased awareness of deficits Awareness: Emergent   General Comments: Pt less impulsive throughout session today requiring only min cues to attend to education with pt listenting much better today. Pt's awareness of safety has improved slightly and wife demonstrated that she can cue pt and handle pt appropriately.      Exercises Total Joint Exercises Ankle Circles/Pumps: AROM;Both;10 reps;Supine;Seated Quad Sets: AROM;Left;10 reps;Seated Heel Slides: AROM;Left;10 reps;Seated Hip ABduction/ADduction: AROM;Left;10 reps;Seated;Standing Long Arc Quad: AROM;10 reps;Left;Seated Knee Flexion: AROM;Left;10 reps;Standing Marching in Standing: AROM;Left;10 reps;Standing Standing Hip Extension: AROM;Left;10 reps;Standing    General Comments        Pertinent Vitals/Pain Pain Assessment: 0-10 Pain Score: 6  Pain Location: L hip Pain Descriptors /  Indicators: Aching;Sore Pain Intervention(s): Limited activity within patient's tolerance;Monitored during session;Repositioned   VSS Home Living                      Prior Function            PT Goals (current goals can now be found in the care plan section) Acute Rehab PT Goals Patient Stated Goal: to go back to work Progress towards PT goals: Progressing toward goals    Frequency    7X/week      PT Plan Discharge plan needs to be updated    Co-evaluation             End of Session Equipment Utilized During Treatment: Gait belt Activity Tolerance: Patient tolerated treatment well Patient left: in chair;with call bell/phone within reach;with family/visitor present     Time: 7116-5790 PT Time Calculation (min) (ACUTE ONLY): 38 min  Charges:  $Gait Training: 23-37 mins $Therapeutic Exercise: 8-22 mins                    G Codes:      Denice Paradise 01/04/2017, 10:45 AM [facility] [phone] (pager)

## 2016-12-21 NOTE — Clinical Social Work Note (Signed)
CSW spoke with pt and pt's wife at bedside. At this time pt's wife states pt is going home with her (w/hh) and she will manage his care. Pt's wife states they live in a one story house and they have a easy accessible shower. Pt's wife reports she cared for him at home after his last surgery and she has no doubts or worries. Clinical Social Worker will sign off for now as social work intervention is no longer needed. Please consult [place] again if new need arises.   [address] [place]

## 2016-12-26 ENCOUNTER — Telehealth (INDEPENDENT_AMBULATORY_CARE_PROVIDER_SITE_OTHER): Payer: Self-pay | Admitting: Orthopaedic Surgery

## 2016-12-26 NOTE — Telephone Encounter (Signed)
called

## 2016-12-26 NOTE — Telephone Encounter (Signed)
Please call.

## 2016-12-26 NOTE — Telephone Encounter (Signed)
Patient had a hip replacement on 12/20/15, wife says his knee and ankle are swollen, not hot nor red, patient is walking fine. She is worried and wants to speak to someone to make sure this is normal.  Cb#[phone] (linda-wife)

## 2016-12-27 ENCOUNTER — Telehealth (INDEPENDENT_AMBULATORY_CARE_PROVIDER_SITE_OTHER): Payer: Self-pay | Admitting: Orthopedic Surgery

## 2016-12-27 ENCOUNTER — Telehealth: Payer: Self-pay | Admitting: Cardiovascular Disease

## 2016-12-27 NOTE — Telephone Encounter (Signed)
Pt calling he was told to call us when he started back on his [place] He states he started it on 12/19/16 He would like to know what to do from here on Please call

## 2016-12-27 NOTE — Telephone Encounter (Signed)
Sonia Side, the Medical sales representative from [facility] called about lab orders for patient. The patient is on coumadin and needs lab work done. Sonia Side is requesting a call or text from [place] please. Cb# [phone]

## 2016-12-27 NOTE — Telephone Encounter (Signed)
Called spoke with pt and his wife, he states he resumed Coumadin on 12/19/16 post surgery and is also on Lovenox at present.  Advised pt he would need to come into office for an INR check since he has been on Coumadin x 1 week he should be therapeutic and be able to d/c Lovenox.  Pt states he only has 2 shots of Lovenox left, advised pt we needed an INR >2.0 to be able to discontinue Lovenox.  Pt's wife was adamantly stating he cannot come into the office, he has PT coming to the home twice weekly, but he has only ambulated a few feet and pt cannot physically get into our office. [facility] is the [place] who is providing PT at present, advised pt and his wife I would call [facility] and see if they could get nursing out to the home to check an INR tomorrow.  Called [facility], spoke with Air cabin crew she states she only has an order from Dr Durward Fortes to provide PT.  Explained pt was discharged on Coumadin and Lovenox by Dr Durward Fortes and needs an INR check since he has been back on Coumadin since 12/19/16 and only has 2 shots of Lovenox left.  She states she will contact Dr Rudene Anda office to see if she can get an order for nursing services to check an INR with results to [facility].  She will contact us back to let us know the outcome. Will await her call back.

## 2016-12-28 ENCOUNTER — Ambulatory Visit (INDEPENDENT_AMBULATORY_CARE_PROVIDER_SITE_OTHER): Payer: Medicare Other | Admitting: Pharmacist

## 2016-12-28 DIAGNOSIS — I4891 Unspecified atrial fibrillation: Secondary | ICD-10-CM

## 2016-12-28 LAB — POCT INR: INR: 1.6

## 2016-12-28 MED ORDER — ENOXAPARIN SODIUM 150 MG/ML ~~LOC~~ SOLN: 150.0000 mg | SUBCUTANEOUS | 0 refills | Status: DC

## 2016-12-28 NOTE — Telephone Encounter (Signed)
Please call.

## 2016-12-28 NOTE — Telephone Encounter (Signed)
LMTCB with nurse manager Cindy to confirm INR to be drawn?

## 2016-12-28 NOTE — Telephone Encounter (Signed)
See anticoag encounter from 12/28/2016

## 2017-01-01 ENCOUNTER — Ambulatory Visit (INDEPENDENT_AMBULATORY_CARE_PROVIDER_SITE_OTHER): Payer: Medicare Other | Admitting: Cardiology

## 2017-01-01 DIAGNOSIS — I4891 Unspecified atrial fibrillation: Secondary | ICD-10-CM

## 2017-01-01 LAB — POCT INR: INR: 1.6

## 2017-01-01 NOTE — Addendum Note (Signed)
Encounter addended by: Ronnette Hila, RN on: 01/01/2017  2:44 PM<BR>    Actions taken: Delete clinical note

## 2017-01-02 NOTE — Progress Notes (Deleted)
Office Visit Note   Patient: Jesus Hendrix           Date of Birth: 1946-11-09           MRN: [id number] Visit Date: 01/04/2017              Requested by: Lenard Simmer, MD [address] [place], [place] [address] PCP: Lenard Simmer, MD   Assessment & Plan: Visit Diagnoses: No diagnosis found.  Plan: ***  Follow-Up Instructions: No Follow-up on file.   Orders:  No orders of the defined types were placed in this encounter.  No orders of the defined types were placed in this encounter.     Procedures: No procedures performed   Clinical Data: No additional findings.   Subjective: No chief complaint on file.   {Left THA on 12/19/16. LEFT HIP AVASCULAR NECROSIS    Review of Systems   Objective: Vital Signs: There were no vitals taken for this visit.  Physical Exam  Ortho Exam  Specialty Comments:  No specialty comments available.  Imaging: No results found.   PMFS History: Patient Active Problem List   Diagnosis Date Noted  . Avascular necrosis of bone of hip, left ([place]) 12/19/2016  . Status post total replacement of left hip 12/19/2016  . Long term (current) use of anticoagulants [Z79.01] 08/09/2016  . New onset a-fib ([place]) 06/06/2016  . Anxiety 06/06/2016  . Hip pain 06/06/2016  . Shortness of breath   . Preop cardiovascular exam 11/15/2015  . Special screening for malignant neoplasms, colon   . Benign neoplasm of sigmoid colon   . Rectal polyp   . Mild aortic valve stenosis 07/13/2014  . Carotid stenosis 07/13/2014  . Near syncope 04/15/2014  . Murmur 04/15/2014  . Bruit 04/15/2014  . Bradycardia 04/15/2014  . LBBB (left bundle branch block) 04/15/2014  . First degree AV block 04/15/2014  . Wegener's granulomatosis ([place]) 05/23/2013  . CAD (coronary artery disease) 01/08/2012  . 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 Hyperlipidemia 01/08/2012  . HTN (hypertension) 01/08/2012   Past Medical History:  Diagnosis Date  . Anxiety   . Aortic  stenosis    a. mild on echo 04/2014  . Atrial fibrillation ([place]) 06/06/2016   a. newly diagnosed 06/06/2016; b. CHADS2VASc => 5 (CHF, HTN, age x 1, DM, vascular disease)  . Choledocholithiasis   . Chronic kidney disease   . Chronic systolic CHF (congestive heart failure) ([place])    a. echo 04/2014: EF 45-50%, nl LV dia fxn, mild AS, LA mildly dilated, PASP nl  . COPD (chronic obstructive pulmonary disease) ([place])    Not on home oxygen  . Coronary artery disease    a. status post CABG in 2007; b. nuc stress test 2014: small region of mild ischemia in the inferior territory, EF 61%, no ischemia, scan similar to prior scan in 2009  . Exogenous obesity   . GERD (gastroesophageal reflux disease)   . Gout   . Hiatal hernia   . Hip pain, chronic 08/03/2016   Waiting to have hip replacement pending NM stress test results per patient.  . History of colonic diverticulitis   . History of renal cell carcinoma    a. s/p laser ablation   . Hyperlipidemia   . Hypertension   . Near syncope    a. 3 separate events in 2017  . Nephrolithiasis   . Panic attacks   . Spinal stenosis   . Temporomandibular joint pain dysfunction syndrome   .  Umbilical hernia   . Vitamin D deficiency   . Wegener's granulomatosis with renal involvement ([place])     Family History  Problem Relation Age of Onset  . Heart disease Mother   . Heart disease Sister   . Heart disease Brother     Past Surgical History:  Procedure Laterality Date  . CARDIAC CATHETERIZATION    . CHOLECYSTECTOMY    . COLONOSCOPY N/A 06/03/2015   Procedure: COLONOSCOPY;  Surgeon: Lucilla Lame, MD;  Location: [facility];  Service: Gastroenterology;  Laterality: N/A;  . CORONARY ARTERY BYPASS GRAFT  2004   CABG x 3  . EYE SURGERY    . HERNIA REPAIR     x 2  . HIP SURGERY     right hip replacement  . INTRAOCULAR LENS INSERTION    . KIDNEY SURGERY     cancer  . NOSE SURGERY    . POLYPECTOMY  06/03/2015   Procedure: POLYPECTOMY;  Surgeon:  Lucilla Lame, MD;  Location: [facility];  Service: Gastroenterology;;  . PROSTATE SURGERY    . TOTAL HIP ARTHROPLASTY Left 12/19/2016   Procedure: LEFT TOTAL HIP ARTHROPLASTY;  Surgeon: Garald Balding, MD;  Location: [facility];  Service: Orthopedics;  Laterality: Left;   Social History   Occupational History  . Not on file.   Social History Main Topics  . Smoking status: Former Smoker    Packs/day: 1.00    Years: 25.00    Types: Cigarettes    Quit date: 12/29/1974  . Smokeless tobacco: Never Used  . Alcohol use 1.2 oz/week    2 Cans of beer per week     Comment: social  . Drug use: No  . Sexual activity: Not on file

## 2017-01-04 ENCOUNTER — Inpatient Hospital Stay (INDEPENDENT_AMBULATORY_CARE_PROVIDER_SITE_OTHER): Payer: Medicare Other | Admitting: Orthopaedic Surgery

## 2017-01-04 ENCOUNTER — Telehealth: Payer: Self-pay | Admitting: Physician Assistant

## 2017-01-04 NOTE — Telephone Encounter (Signed)
Estill Bamberg with [facility] called inclement weather answering service. She is unable to get out to patient's home due to snow and the patient also just had fresh hip replacement and has been unable to clear his driveway. She is inquiring about further Coumadin dosing. Pt was placed on 5 days of Lovenox leaving the hospital, recent INR 1.6 on 01/01/17. Received several days of reloading with 10mg  dosing then 7.5mg  yesterday. Reviewed with pharmD at [facility] who would recommend Coumadin 5mg  today with recheck tomorrow. Estill Bamberg plans to get to patient tomorrow as priority. She verbalized understanding of plan and will convey to patient. Will forward to Coumadin clinic. Dayna Dunn PA-C

## 2017-01-05 ENCOUNTER — Ambulatory Visit (INDEPENDENT_AMBULATORY_CARE_PROVIDER_SITE_OTHER): Payer: Medicare Other

## 2017-01-05 ENCOUNTER — Encounter (INDEPENDENT_AMBULATORY_CARE_PROVIDER_SITE_OTHER): Payer: Self-pay | Admitting: Orthopaedic Surgery

## 2017-01-05 ENCOUNTER — Ambulatory Visit (INDEPENDENT_AMBULATORY_CARE_PROVIDER_SITE_OTHER): Payer: Medicare Other | Admitting: Orthopaedic Surgery

## 2017-01-05 VITALS — BP 134/81 | HR 77 | Ht 67.0 in | Wt 215.0 lb

## 2017-01-05 DIAGNOSIS — Z96642 Presence of left artificial hip joint: Secondary | ICD-10-CM

## 2017-01-05 DIAGNOSIS — Z7901 Long term (current) use of anticoagulants: Secondary | ICD-10-CM

## 2017-01-05 DIAGNOSIS — I4891 Unspecified atrial fibrillation: Secondary | ICD-10-CM

## 2017-01-05 LAB — POCT INR: INR: 2.1

## 2017-01-05 NOTE — Progress Notes (Signed)
Office Visit Note   Patient: Jesus Hendrix           Date of Birth: Feb 09, 1946           MRN: [id number] Visit Date: 01/05/2017              Requested by: Lenard Simmer, MD [address] [place], [place] [address] PCP: Lenard Simmer, MD   Assessment & Plan: Visit Diagnoses:s/p left THR   Plan: Office 2 weeks, weightbearing as tolerated, may shower, continue Coumadin per his preop status.  Follow-Up Instructions: No Follow-up on file.   Orders:  No orders of the defined types were placed in this encounter.  No orders of the defined types were placed in this encounter.     Procedures: No procedures performed   Clinical Data: No additional findings.   Subjective: No chief complaint on file.   Pt is approx. 3 week post op Left THA on 12/19/16. He states left leg is swollen, no bowel issues but states is going to his PCP b/c he might have a kidney infection.  Pt states he is working with in home PT 3 x week. Pt ambulates sometimes with a cane.    Taking Coumadin per his preop status and relates not having any significant pain in his hip or his calf. Receiving home health physical therapy  Review of Systems   Objective: Vital Signs: There were no vitals taken for this visit.  Physical Exam  Ortho Exam left hip incision clean and dry. Staples removed and Steri-Strips applied. Neurovascular exam intact to left foot. Still some mild calf ankle and foot swelling but without any pain. Good pulses. Walks with a limp using a cane.  Specialty Comments:  No specialty comments available.  Imaging: No results found.   PMFS History: Patient Active Problem List   Diagnosis Date Noted  . Avascular necrosis of bone of hip, left ([place]) 12/19/2016  . Status post total replacement of left hip 12/19/2016  . Long term (current) use of anticoagulants [Z79.01] 08/09/2016  . New onset a-fib ([place]) 06/06/2016  . Anxiety 06/06/2016  . Hip pain 06/06/2016  . Shortness of  breath   . Preop cardiovascular exam 11/15/2015  . Special screening for malignant neoplasms, colon   . Benign neoplasm of sigmoid colon   . Rectal polyp   . Mild aortic valve stenosis 07/13/2014  . Carotid stenosis 07/13/2014  . Near syncope 04/15/2014  . Murmur 04/15/2014  . Bruit 04/15/2014  . Bradycardia 04/15/2014  . LBBB (left bundle branch block) 04/15/2014  . First degree AV block 04/15/2014  . 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 CAD (coronary artery disease) 01/08/2012  . 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 Hyperlipidemia 01/08/2012  . HTN (hypertension) 01/08/2012   Past Medical History:  Diagnosis Date  . Anxiety   . Aortic stenosis    a. mild on echo 04/2014  . Atrial fibrillation ([place]) 06/06/2016   a. newly diagnosed 06/06/2016; b. CHADS2VASc => 5 (CHF, HTN, age x 1, DM, vascular disease)  . Choledocholithiasis   . Chronic kidney disease   . Chronic systolic CHF (congestive heart failure) ([place])    a. echo 04/2014: EF 45-50%, nl LV dia fxn, mild AS, LA mildly dilated, PASP nl  . COPD (chronic obstructive pulmonary disease) ([place])    Not on home oxygen  . Coronary artery disease    a. status post CABG in 2007; b. nuc stress test 2014: small region of mild ischemia  in the inferior territory, EF 61%, no ischemia, scan similar to prior scan in 2009  . Exogenous obesity   . GERD (gastroesophageal reflux disease)   . Gout   . Hiatal hernia   . Hip pain, chronic 08/03/2016   Waiting to have hip replacement pending NM stress test results per patient.  . History of colonic diverticulitis   . History of renal cell carcinoma    a. s/p laser ablation   . Hyperlipidemia   . Hypertension   . Near syncope    a. 3 separate events in 2017  . Nephrolithiasis   . Panic attacks   . Spinal stenosis   . Temporomandibular joint pain dysfunction syndrome   . Umbilical hernia   . Vitamin D deficiency   . Wegener's granulomatosis with renal involvement ([place])     Family History    Problem Relation Age of Onset  . Heart disease Mother   . Heart disease Sister   . Heart disease Brother     Past Surgical History:  Procedure Laterality Date  . CARDIAC CATHETERIZATION    . CHOLECYSTECTOMY    . COLONOSCOPY N/A 06/03/2015   Procedure: COLONOSCOPY;  Surgeon: Lucilla Lame, MD;  Location: [facility];  Service: Gastroenterology;  Laterality: N/A;  . CORONARY ARTERY BYPASS GRAFT  2004   CABG x 3  . EYE SURGERY    . HERNIA REPAIR     x 2  . HIP SURGERY     right hip replacement  . INTRAOCULAR LENS INSERTION    . KIDNEY SURGERY     cancer  . NOSE SURGERY    . POLYPECTOMY  06/03/2015   Procedure: POLYPECTOMY;  Surgeon: Lucilla Lame, MD;  Location: [facility];  Service: Gastroenterology;;  . PROSTATE SURGERY    . TOTAL HIP ARTHROPLASTY Left 12/19/2016   Procedure: LEFT TOTAL HIP ARTHROPLASTY;  Surgeon: Garald Balding, MD;  Location: [facility];  Service: Orthopedics;  Laterality: Left;   Social History   Occupational History  . Not on file.   Social History Main Topics  . Smoking status: Former Smoker    Packs/day: 1.00    Years: 25.00    Types: Cigarettes    Quit date: 12/29/1974  . Smokeless tobacco: Never Used  . Alcohol use 1.2 oz/week    2 Cans of beer per week     Comment: social  . Drug use: No  . Sexual activity: Not on file

## 2017-01-08 ENCOUNTER — Inpatient Hospital Stay (INDEPENDENT_AMBULATORY_CARE_PROVIDER_SITE_OTHER): Payer: Medicare Other | Admitting: Orthopaedic Surgery

## 2017-01-18 ENCOUNTER — Ambulatory Visit (INDEPENDENT_AMBULATORY_CARE_PROVIDER_SITE_OTHER): Payer: Medicare Other | Admitting: Orthopaedic Surgery

## 2017-01-18 ENCOUNTER — Ambulatory Visit: Payer: Medicare Other | Admitting: Cardiovascular Disease

## 2017-01-18 ENCOUNTER — Encounter (INDEPENDENT_AMBULATORY_CARE_PROVIDER_SITE_OTHER): Payer: Self-pay | Admitting: Orthopaedic Surgery

## 2017-01-18 VITALS — BP 143/71 | HR 76 | Resp 15 | Ht 67.0 in | Wt 216.0 lb

## 2017-01-18 DIAGNOSIS — Z96642 Presence of left artificial hip joint: Secondary | ICD-10-CM

## 2017-01-18 NOTE — Progress Notes (Signed)
Office Visit Note   Patient: Jesus [place]           Date of Birth: March 11, 1946           MRN: [id number] Visit Date: 01/18/2017              Requested by: Lenard Simmer, MD [address] [place], [place] [address] PCP: Lenard Simmer, MD   Assessment & Plan: Visit Diagnoses:  1. History of total left hip arthroplasty   1 month status post primary left total hip replacement. Doing very well. Not using any ambulatory aid. Has completed his course of physical therapy.  Plan: Discussed hip precautions. Up in 1 month. He can return to some limited activity and work.  Follow-Up Instructions: Return in about 1 month (around 02/15/2017).   Orders:  No orders of the defined types were placed in this encounter.  No orders of the defined types were placed in this encounter.     Procedures: No procedures performed   Clinical Data: No additional findings.   Subjective: Chief Complaint  Patient presents with  . Left Hip - Routine Post Op    HPI  Review of Systems Jesus [place]rix is 1 month status post primary left total hip replacement. He's doing very well. Not taking any analgesics. Not using any ambulatory aid. Very happy with his results. He has completed a course of physical therapy  Objective: Vital Signs: BP (!) 143/71 (BP Location: Right Arm, Patient Position: Sitting, Cuff Size: Normal)   Pulse 76   Resp 15   Ht 5\' 7"  (1.702 m)   Wt 216 lb (98 kg)   BMI 33.83 kg/m   Physical Exam  Ortho Exam painless range of motion left hip. Walks with minimal limp. No pain with ambulation. No swelling distally. Neurovascular exam intact.  Specialty Comments:  No specialty comments available.  Imaging: No results found.   PMFS History: Patient Active Problem List   Diagnosis Date Noted  . Avascular necrosis of bone of hip, left ([place]) 12/19/2016  . Status post total replacement of left hip 12/19/2016  . Long term (current) use of anticoagulants [Z79.01] 08/09/2016  .  New onset a-fib ([place]) 06/06/2016  . Anxiety 06/06/2016  . Hip pain 06/06/2016  . Shortness of breath   . Preop cardiovascular exam 11/15/2015  . Special screening for malignant neoplasms, colon   . Benign neoplasm of sigmoid colon   . Rectal polyp   . Mild aortic valve stenosis 07/13/2014  . Carotid stenosis 07/13/2014  . Near syncope 04/15/2014  . Murmur 04/15/2014  . Bruit 04/15/2014  . Bradycardia 04/15/2014  . LBBB (left bundle branch block) 04/15/2014  . First degree AV block 04/15/2014  . Wegener's granulomatosis ([place]) 05/23/2013  . CAD (coronary artery disease) 01/08/2012  . 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 Hyperlipidemia 01/08/2012  . HTN (hypertension) 01/08/2012   Past Medical History:  Diagnosis Date  . Anxiety   . Aortic stenosis    a. mild on echo 04/2014  . Atrial fibrillation ([place]) 06/06/2016   a. newly diagnosed 06/06/2016; b. CHADS2VASc => 5 (CHF, HTN, age x 1, DM, vascular disease)  . Choledocholithiasis   . Chronic kidney disease   . Chronic systolic CHF (congestive heart failure) ([place])    a. echo 04/2014: EF 45-50%, nl LV dia fxn, mild AS, LA mildly dilated, PASP nl  . COPD (chronic obstructive pulmonary disease) ([place])    Not on home oxygen  . Coronary artery disease  a. status post CABG in 2007; b. nuc stress test 2014: small region of mild ischemia in the inferior territory, EF 61%, no ischemia, scan similar to prior scan in 2009  . Exogenous obesity   . GERD (gastroesophageal reflux disease)   . Gout   . Hiatal hernia   . Hip pain, chronic 08/03/2016   Waiting to have hip replacement pending NM stress test results per patient.  . History of colonic diverticulitis   . History of renal cell carcinoma    a. s/p laser ablation   . Hyperlipidemia   . Hypertension   . Near syncope    a. 3 separate events in 2017  . Nephrolithiasis   . Panic attacks   . Spinal stenosis   . Temporomandibular joint pain dysfunction syndrome   . Umbilical hernia   . Vitamin  D deficiency   . Wegener's granulomatosis with renal involvement ([place])     Family History  Problem Relation Age of Onset  . Heart disease Mother   . Heart disease Sister   . Heart disease Brother     Past Surgical History:  Procedure Laterality Date  . CARDIAC CATHETERIZATION    . CHOLECYSTECTOMY    . COLONOSCOPY N/A 06/03/2015   Procedure: COLONOSCOPY;  Surgeon: Lucilla Lame, MD;  Location: [facility];  Service: Gastroenterology;  Laterality: N/A;  . CORONARY ARTERY BYPASS GRAFT  2004   CABG x 3  . EYE SURGERY    . HERNIA REPAIR     x 2  . HIP SURGERY     right hip replacement  . INTRAOCULAR LENS INSERTION    . KIDNEY SURGERY     cancer  . NOSE SURGERY    . POLYPECTOMY  06/03/2015   Procedure: POLYPECTOMY;  Surgeon: Lucilla Lame, MD;  Location: [facility];  Service: Gastroenterology;;  . PROSTATE SURGERY    . TOTAL HIP ARTHROPLASTY Left 12/19/2016   Procedure: LEFT TOTAL HIP ARTHROPLASTY;  Surgeon: Garald Balding, MD;  Location: [facility];  Service: Orthopedics;  Laterality: Left;   Social History   Occupational History  . Not on file.   Social History Main Topics  . Smoking status: Former Smoker    Packs/day: 0.50    Years: 25.00    Types: Cigarettes    Quit date: 12/29/1974  . Smokeless tobacco: Never Used  . Alcohol use 1.2 oz/week    2 Cans of beer per week     Comment: social  . Drug use: No  . Sexual activity: Not on file

## 2017-01-18 NOTE — Progress Notes (Deleted)
Office Visit Note   Patient: Jesus [place]           Date of Birth: 1946-08-05           MRN: [id number] Visit Date: 01/18/2017              Requested by: Lenard Simmer, MD [address] [place], [place] [address] PCP: Lenard Simmer, MD   Assessment & Plan: Visit Diagnoses:  1. History of total left hip arthroplasty     Plan: ***  Follow-Up Instructions: Return in about 1 month (around 02/15/2017).   Orders:  No orders of the defined types were placed in this encounter.  No orders of the defined types were placed in this encounter.     Procedures: No procedures performed   Clinical Data: No additional findings.   Subjective: Chief Complaint  Patient presents with  . Left Hip - Routine Post Op    THR - LEFT, 12/19/16, doing excellent, no PT, no pain    Review of Systems   Objective: Vital Signs: BP (!) 143/71 (BP Location: Right Arm, Patient Position: Sitting, Cuff Size: Normal)   Pulse 76   Resp 15   Ht 5\' 7"  (1.702 m)   Wt 216 lb (98 kg)   BMI 33.83 kg/m   Physical Exam  Ortho Exam  Specialty Comments:  No specialty comments available.  Imaging: No results found.   PMFS History: Patient Active Problem List   Diagnosis Date Noted  . Avascular necrosis of bone of hip, left ([place]) 12/19/2016  . Status post total replacement of left hip 12/19/2016  . Long term (current) use of anticoagulants [Z79.01] 08/09/2016  . New onset a-fib ([place]) 06/06/2016  . Anxiety 06/06/2016  . Hip pain 06/06/2016  . Shortness of breath   . Preop cardiovascular exam 11/15/2015  . Special screening for malignant neoplasms, colon   . Benign neoplasm of sigmoid colon   . Rectal polyp   . Mild aortic valve stenosis 07/13/2014  . Carotid stenosis 07/13/2014  . Near syncope 04/15/2014  . Murmur 04/15/2014  . Bruit 04/15/2014  . Bradycardia 04/15/2014  . LBBB (left bundle branch block) 04/15/2014  . First degree AV block 04/15/2014  . Wegener's  granulomatosis ([place]) 05/23/2013  . CAD (coronary artery disease) 01/08/2012  . 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 Hyperlipidemia 01/08/2012  . HTN (hypertension) 01/08/2012   Past Medical History:  Diagnosis Date  . Anxiety   . Aortic stenosis    a. mild on echo 04/2014  . Atrial fibrillation ([place]) 06/06/2016   a. newly diagnosed 06/06/2016; b. CHADS2VASc => 5 (CHF, HTN, age x 1, DM, vascular disease)  . Choledocholithiasis   . Chronic kidney disease   . Chronic systolic CHF (congestive heart failure) ([place])    a. echo 04/2014: EF 45-50%, nl LV dia fxn, mild AS, LA mildly dilated, PASP nl  . COPD (chronic obstructive pulmonary disease) ([place])    Not on home oxygen  . Coronary artery disease    a. status post CABG in 2007; b. nuc stress test 2014: small region of mild ischemia in the inferior territory, EF 61%, no ischemia, scan similar to prior scan in 2009  . Exogenous obesity   . GERD (gastroesophageal reflux disease)   . Gout   . Hiatal hernia   . Hip pain, chronic 08/03/2016   Waiting to have hip replacement pending NM stress test results per patient.  . History of colonic diverticulitis   .  History of renal cell carcinoma    a. s/p laser ablation   . Hyperlipidemia   . Hypertension   . Near syncope    a. 3 separate events in 2017  . Nephrolithiasis   . Panic attacks   . Spinal stenosis   . Temporomandibular joint pain dysfunction syndrome   . Umbilical hernia   . Vitamin D deficiency   . Wegener's granulomatosis with renal involvement ([place])     Family History  Problem Relation Age of Onset  . Heart disease Mother   . Heart disease Sister   . Heart disease Brother     Past Surgical History:  Procedure Laterality Date  . CARDIAC CATHETERIZATION    . CHOLECYSTECTOMY    . COLONOSCOPY N/A 06/03/2015   Procedure: COLONOSCOPY;  Surgeon: Lucilla Lame, MD;  Location: [facility];  Service: Gastroenterology;  Laterality: N/A;  . CORONARY ARTERY BYPASS GRAFT  2004   CABG x 3    . EYE SURGERY    . HERNIA REPAIR     x 2  . HIP SURGERY     right hip replacement  . INTRAOCULAR LENS INSERTION    . KIDNEY SURGERY     cancer  . NOSE SURGERY    . POLYPECTOMY  06/03/2015   Procedure: POLYPECTOMY;  Surgeon: Lucilla Lame, MD;  Location: [facility];  Service: Gastroenterology;;  . PROSTATE SURGERY    . TOTAL HIP ARTHROPLASTY Left 12/19/2016   Procedure: LEFT TOTAL HIP ARTHROPLASTY;  Surgeon: Garald Balding, MD;  Location: [facility];  Service: Orthopedics;  Laterality: Left;   Social History   Occupational History  . Not on file.   Social History Main Topics  . Smoking status: Former Smoker    Packs/day: 0.50    Years: 25.00    Types: Cigarettes    Quit date: 12/29/1974  . Smokeless tobacco: Never Used  . Alcohol use 1.2 oz/week    2 Cans of beer per week     Comment: social  . Drug use: No  . Sexual activity: Not on file

## 2017-01-19 ENCOUNTER — Ambulatory Visit (INDEPENDENT_AMBULATORY_CARE_PROVIDER_SITE_OTHER): Payer: Medicare Other | Admitting: Orthopaedic Surgery

## 2017-01-24 ENCOUNTER — Ambulatory Visit (INDEPENDENT_AMBULATORY_CARE_PROVIDER_SITE_OTHER): Payer: Medicare Other

## 2017-01-24 DIAGNOSIS — Z7901 Long term (current) use of anticoagulants: Secondary | ICD-10-CM | POA: Diagnosis not present

## 2017-01-24 DIAGNOSIS — I4891 Unspecified atrial fibrillation: Secondary | ICD-10-CM

## 2017-01-24 LAB — POCT INR: INR: 2.3

## 2017-02-13 ENCOUNTER — Ambulatory Visit (INDEPENDENT_AMBULATORY_CARE_PROVIDER_SITE_OTHER): Payer: Medicare Other | Admitting: Orthopaedic Surgery

## 2017-02-13 ENCOUNTER — Encounter (INDEPENDENT_AMBULATORY_CARE_PROVIDER_SITE_OTHER): Payer: Self-pay | Admitting: Orthopaedic Surgery

## 2017-02-13 VITALS — BP 142/80 | HR 88 | Resp 14 | Ht 67.0 in | Wt 227.0 lb

## 2017-02-13 DIAGNOSIS — Z96642 Presence of left artificial hip joint: Secondary | ICD-10-CM

## 2017-02-13 NOTE — Progress Notes (Signed)
Office Visit Note   Patient: Jesus Hendrix           Date of Birth: [birth year]           MRN: [id number] Visit Date: 02/13/2017              Requested by: Lenard Simmer, MD [address] [place], [place] [address] PCP: Lenard Simmer, MD   Assessment & Plan: Visit Diagnoses:  1. Status post total replacement of left hip     Plan:  #1: Continue activities as tolerated. #2: Continue hip precautions   Follow-Up Instructions: Return in about 6 months (around 08/13/2017).   Orders:  No orders of the defined types were placed in this encounter.  No orders of the defined types were placed in this encounter.     Procedures: No procedures performed   Clinical Data: No additional findings.   Subjective: Chief Complaint  Patient presents with  . Left Hip - Routine Post Op    Dec 19 2016    Jesus Hendrix is now 2 months status post left total hip arthroplasty. He's actually doing very well. Denies any pain at this time. He is back to work.    Review of Systems   Objective: Vital Signs: BP (!) 142/80   Pulse 88   Resp 14   Ht 5\' 7"  (1.702 m)   Wt 227 lb (103 kg)   BMI 35.55 kg/m   Physical Exam  Left Hip Exam   Tenderness  The patient is experiencing no tenderness.     Comments:  Today he has excellent motion of the left hip. No pain. His incision is well-healed. He actually came dancing into the office today.      Specialty Comments:  No specialty comments available.  Imaging: No results found.   PMFS History: Patient Active Problem List   Diagnosis Date Noted  . Avascular necrosis of bone of hip, left ([place]) 12/19/2016  . Status post total replacement of left hip 12/19/2016  . Long term (current) use of anticoagulants [Z79.01] 08/09/2016  . New onset a-fib ([place]) 06/06/2016  . Anxiety 06/06/2016  . Hip pain 06/06/2016  . Shortness of breath   . Preop cardiovascular exam 11/15/2015  . Special screening for malignant neoplasms, colon   .  Benign neoplasm of sigmoid colon   . Rectal polyp   . Mild aortic valve stenosis 07/13/2014  . Carotid stenosis 07/13/2014  . Near syncope 04/15/2014  . Murmur 04/15/2014  . Bruit 04/15/2014  . Bradycardia 04/15/2014  . LBBB (left bundle branch block) 04/15/2014  . First degree AV block 04/15/2014  . Wegener's granulomatosis ([place]) 05/23/2013  . CAD (coronary artery disease) 01/08/2012  . 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 Hyperlipidemia 01/08/2012  . HTN (hypertension) 01/08/2012   Past Medical History:  Diagnosis Date  . Anxiety   . Aortic stenosis    a. mild on echo 04/2014  . Atrial fibrillation ([place]) 06/06/2016   a. newly diagnosed 06/06/2016; b. CHADS2VASc => 5 (CHF, HTN, age x 1, DM, vascular disease)  . Choledocholithiasis   . Chronic kidney disease   . Chronic systolic CHF (congestive heart failure) ([place])    a. echo 04/2014: EF 45-50%, nl LV dia fxn, mild AS, LA mildly dilated, PASP nl  . COPD (chronic obstructive pulmonary disease) ([place])    Not on home oxygen  . Coronary artery disease    a. status post CABG in 2007; b. nuc stress test 2014: small region  of mild ischemia in the inferior territory, EF 61%, no ischemia, scan similar to prior scan in 2009  . Exogenous obesity   . GERD (gastroesophageal reflux disease)   . Gout   . Hiatal hernia   . Hip pain, chronic 08/03/2016   Waiting to have hip replacement pending NM stress test results per patient.  . History of colonic diverticulitis   . History of renal cell carcinoma    a. s/p laser ablation   . Hyperlipidemia   . Hypertension   . Near syncope    a. 3 separate events in 2017  . Nephrolithiasis   . Panic attacks   . Spinal stenosis   . Temporomandibular joint pain dysfunction syndrome   . Umbilical hernia   . Vitamin D deficiency   . Wegener's granulomatosis with renal involvement ([place])     Family History  Problem Relation Age of Onset  . Heart disease Mother   . Heart disease Sister   . Heart disease Brother      Past Surgical History:  Procedure Laterality Date  . CARDIAC CATHETERIZATION    . CHOLECYSTECTOMY    . COLONOSCOPY N/A 06/03/2015   Procedure: COLONOSCOPY;  Surgeon: Lucilla Lame, MD;  Location: [facility];  Service: Gastroenterology;  Laterality: N/A;  . CORONARY ARTERY BYPASS GRAFT  2004   CABG x 3  . EYE SURGERY    . HERNIA REPAIR     x 2  . HIP SURGERY     right hip replacement  . INTRAOCULAR LENS INSERTION    . KIDNEY SURGERY     cancer  . NOSE SURGERY    . POLYPECTOMY  06/03/2015   Procedure: POLYPECTOMY;  Surgeon: Lucilla Lame, MD;  Location: [facility];  Service: Gastroenterology;;  . PROSTATE SURGERY    . TOTAL HIP ARTHROPLASTY Left 12/19/2016   Procedure: LEFT TOTAL HIP ARTHROPLASTY;  Surgeon: Garald Balding, MD;  Location: [facility];  Service: Orthopedics;  Laterality: Left;   Social History   Occupational History  . Not on file.   Social History Main Topics  . Smoking status: Former Smoker    Packs/day: 0.50    Years: 25.00    Types: Cigarettes    Quit date: 12/29/1974  . Smokeless tobacco: Never Used  . Alcohol use 1.2 oz/week    2 Cans of beer per week     Comment: social  . Drug use: No  . Sexual activity: Not on file

## 2017-02-16 ENCOUNTER — Ambulatory Visit (INDEPENDENT_AMBULATORY_CARE_PROVIDER_SITE_OTHER): Payer: Medicare Other | Admitting: Orthopaedic Surgery

## 2017-02-16 ENCOUNTER — Telehealth: Payer: Self-pay | Admitting: Cardiovascular Disease

## 2017-02-16 NOTE — Telephone Encounter (Signed)
[facility] will fax over a surgical clearance for for procedure 02/27/17 .

## 2017-02-19 ENCOUNTER — Telehealth: Payer: Self-pay | Admitting: Cardiovascular Disease

## 2017-02-19 NOTE — Telephone Encounter (Signed)
Received cardiac clearance request for pt to proceed w/ cystoscopy w/ diagnostic ureteroscopy on 02/27/17 w/ Wynelle Cleveland, AGNP at [facility].  Please provide instructions regarding when pt may come off of warfarin prior to surgery and instructions on bridging, if required.    Please fax clearance & instructions to [phone], Attn: Urology Scheduler.

## 2017-02-20 ENCOUNTER — Telehealth: Payer: Self-pay | Admitting: Cardiovascular Disease

## 2017-02-20 NOTE — Telephone Encounter (Signed)
Pt calling stating he is having what he thinks is a anxiety attack He states no CP  States it is been going on since 730 an  Usually doesn't last this long Just can't sit still   Advised him to call his PCP  He states he will call them  Nothing further needed.

## 2017-02-21 ENCOUNTER — Ambulatory Visit (INDEPENDENT_AMBULATORY_CARE_PROVIDER_SITE_OTHER): Payer: Medicare Other

## 2017-02-21 DIAGNOSIS — I4891 Unspecified atrial fibrillation: Secondary | ICD-10-CM

## 2017-02-21 DIAGNOSIS — Z7901 Long term (current) use of anticoagulants: Secondary | ICD-10-CM | POA: Diagnosis not present

## 2017-02-21 LAB — POCT INR: INR: 2

## 2017-02-21 NOTE — Telephone Encounter (Signed)
[facility] calling to check status of surgical clearance.  Surgery on Monday.

## 2017-02-21 NOTE — Telephone Encounter (Signed)
Left detailed voicemail message that cardiac clearance has been faxed to [facility] and to call back if any further questions.

## 2017-02-21 NOTE — Telephone Encounter (Signed)
Case discussed with [name] (seen in coumadin clinic today) He will hold his warfarin today in preparation for surgery on Monday, March 12th INR today 2.0 (Patient has paperwork with him confirming procedures on March 12 not the 13th as detailed below) He does not need Lovenox bridge prior to the procedure Acceptable risk, no further testing needed thx TGollan      Received cardiac clearance request for pt to proceed w/ cystoscopy w/ diagnostic ureteroscopy on 02/27/17 w/ Wynelle Cleveland, AGNP at [facility].  Please provide instructions regarding when pt may come off of warfarin prior to surgery and instructions on bridging, if required.    Please fax clearance & instructions to [phone], Attn: Urology Scheduler.

## 2017-02-21 NOTE — Telephone Encounter (Signed)
No answer. No voicemail. 

## 2017-02-21 NOTE — Telephone Encounter (Signed)
Cardiac clearance routed to [facility].

## 2017-03-07 ENCOUNTER — Ambulatory Visit (INDEPENDENT_AMBULATORY_CARE_PROVIDER_SITE_OTHER): Payer: Medicare Other

## 2017-03-07 DIAGNOSIS — Z7901 Long term (current) use of anticoagulants: Secondary | ICD-10-CM | POA: Diagnosis not present

## 2017-03-07 DIAGNOSIS — I4891 Unspecified atrial fibrillation: Secondary | ICD-10-CM

## 2017-03-07 LAB — POCT INR: INR: 2.1

## 2017-03-07 MED ORDER — WARFARIN SODIUM 5 MG PO TABS: ORAL_TABLET | ORAL | 1 refills | Status: DC

## 2017-04-03 ENCOUNTER — Telehealth: Payer: Self-pay | Admitting: Cardiovascular Disease

## 2017-04-03 NOTE — Telephone Encounter (Signed)
Pt reports in the past two weeks, he has been feeling SOB "when walking up a slope". He had a left hip replacement one month ago and since, has been experiencing pain in right side of his back. He feels it is related to his hip.  Thinks his back is really "out of whack" but doesn't think chiropracter helped him. Since the hip replacement, his right arm has been aching and it is painful to raise above his head.  Started taking a new anxiety medication two months ago and this may be causing SOB. He can not remember the name of it. He is able to carry on a conversation without feeling winded. Pt denies any other sx. Advised pt to continue to monitor s/s, gradually increase exercise post hip replacement and keep 4/23 OV with Dr. Rockey Situ. He will bring a current medication list to his appointment and f/u w/PCP regarding back, hip and arm pain. He verbalized understanding and is agreeable with plan.

## 2017-04-03 NOTE — Telephone Encounter (Signed)
Pt c/o Shortness Of Breath: STAT if SOB developed within the last 24 hours or pt is noticeably SOB on the phone  1. Are you currently SOB (can you hear that pt is SOB on the phone)? No  2. How long have you been experiencing SOB?  About two weeks  3. Are you SOB when sitting or when up moving around? When moving around   4. Are you currently experiencing any other symptoms? No

## 2017-04-08 DIAGNOSIS — Z7189 Other specified counseling: Secondary | ICD-10-CM | POA: Insufficient documentation

## 2017-04-08 NOTE — Progress Notes (Signed)
[name], male   DOB: 07-06-1946, 71 y.o.   MRN: [id number] Cardiology Office Note  Date:  04/09/2017   ID:  Jesus Hendrix, DOB [birth year], MRN [id number]  PCP:  Lenard Simmer, MD   Chief Complaint  Patient presents with  . other    6 month f/u c/o sob and back pain. Meds reviewed verbally with pt.    HPI:   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 Carloyn Manner and it was recommended he have a CT scan of his lumbar spine, and that we place a referral for him to be seen by Dr. Carloyn Manner. Unclear where he had the opportunity to talk with Dr. Carloyn Manner, neurosurgery  We recommended to him that he discuss this with Dr. Ronnald Collum,  patient indicated that all he wanted to do was x-rays in his office  Reports he is tolerating warfarin Wants to stop some of his medications Denies any significant shortness of breath or leg swelling Unable to work secondary to hip and back pain  EKG on today's visit shows atrial fibrillation with ventricular rate 57 bpm, left bundle branch block  Previously taking care of his wife who had recent fall, wrist fracture, "needs to go back to work"  Echocardiogram reviewed with him showing moderately depressed ejection fraction, anterior wall hypokinesis    issues with anxiety,  previously requested Xanax  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 No significant chest tightness with exertion.  Echocardiogram May 2015 with mild aortic valve stenosis, results were discussed with him  Previous cardiac catheterization in January 2007 details 75-80% long lesion in the LAD at the ostium, 95% lesion in the PDA. He was referred to bypass surgery. 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 Defect worse in the inferoapical region. No ischemia. Ejection fraction 55%. Exercised for 7 METS  Previous history of myalgias on high-dose cholesterol medication. Tolerating Lipitor 30 mg daily  PMH:   has a past medical history of Anxiety; Aortic stenosis; Atrial fibrillation (Jesus Hendrix) (06/06/2016); Choledocholithiasis; Chronic kidney disease; Chronic systolic CHF (congestive heart failure) ([place]); COPD (chronic obstructive pulmonary disease) ([place]); Coronary artery disease; Exogenous obesity; GERD (gastroesophageal reflux disease); Gout; Hiatal hernia; Hip pain, chronic (08/03/2016); History of colonic diverticulitis; History of renal cell carcinoma; Hyperlipidemia; Hypertension; Near syncope; Nephrolithiasis; Panic attacks; Spinal stenosis; Temporomandibular joint pain dysfunction syndrome; Umbilical hernia; Vitamin D deficiency; and Wegener's granulomatosis with renal involvement (Jesus Hendrix).  PSH:    Past Surgical History:  Procedure Laterality Date  . CARDIAC CATHETERIZATION    . CHOLECYSTECTOMY    . COLONOSCOPY N/A 06/03/2015  Procedure: COLONOSCOPY;  Surgeon: Lucilla Lame, MD;  Location: [place];  Service: Gastroenterology;  Laterality: N/A;  . CORONARY ARTERY BYPASS GRAFT  2004   CABG x 3  . EYE SURGERY    .  HERNIA REPAIR     x 2  . HIP SURGERY     right hip replacement  . INTRAOCULAR LENS INSERTION    . KIDNEY SURGERY     cancer  . NOSE SURGERY    . POLYPECTOMY  06/03/2015   Procedure: POLYPECTOMY;  Surgeon: Lucilla Lame, MD;  Location: [place];  Service: Gastroenterology;;  . PROSTATE SURGERY    . TOTAL HIP ARTHROPLASTY Left 12/19/2016   Procedure: LEFT TOTAL HIP ARTHROPLASTY;  Surgeon: Garald Balding, MD;  Location: [place];  Service: Orthopedics;  Laterality: Left;    Current Outpatient Prescriptions  Medication Sig Dispense Refill  . acetaminophen (TYLENOL) 325 MG tablet Take 650 mg by mouth 3 (three) times daily as needed for moderate pain.    Marland Kitchen atorvastatin (LIPITOR) 40 MG tablet Take 1 tablet (40 mg total) by mouth daily. (Patient taking differently: Take 20 mg by mouth daily. ) 90 tablet 4  . Cholecalciferol (VITAMIN D) 2000 units CAPS Take 2,000 Units by mouth daily.    Marland Kitchen ezetimibe (ZETIA) 10 MG tablet Take by mouth.    . fluticasone (FLONASE) 50 MCG/ACT nasal spray Place 2 sprays into both nostrils at bedtime.     . furosemide (LASIX) 20 MG tablet Take 1 tablet (20 mg total) by mouth 2 (two) times daily as needed for fluid or edema. (Patient taking differently: Take 40 mg by mouth daily. ) 60 tablet 6  . losartan (COZAAR) 50 MG tablet Take 25 mg by mouth at bedtime.     . metoprolol succinate (TOPROL-XL) 25 MG 24 hr tablet Take 1 tablet (25 mg total) by mouth daily. 30 tablet 2  . niacin (NIASPAN) 500 MG CR tablet Take 500 mg by mouth daily.    . pantoprazole (PROTONIX) 40 MG tablet Take 40 mg by mouth daily.    . tamsulosin (FLOMAX) 0.4 MG CAPS capsule Take 0.4 mg by mouth at bedtime.     Marland Kitchen warfarin (COUMADIN) 5 MG tablet Take as directed by [facility] 120 tablet 1   No current facility-administered medications for this visit.      Allergies:   Klonopin [clonazepam]   Social History:  The patient  reports that he quit smoking about 42 years ago. His smoking  use included Cigarettes. He has a 12.50 pack-year smoking history. He has never used smokeless tobacco. He reports that he drinks about 1.2 oz of alcohol per week . He reports that he does not use drugs.   Family History:   family history includes Heart disease in his brother, mother, and sister.    Review of Systems: Review of Systems  Constitutional: Negative.   Respiratory: Negative.   Cardiovascular: Negative.   Gastrointestinal: Negative.   Musculoskeletal: Positive for back pain and joint pain.  Neurological: Negative.   All other systems reviewed and are negative.    PHYSICAL EXAM: VS:  BP 120/60 (BP Location: Left Arm, Patient Position: Sitting, Cuff Size: Normal)   Pulse (!) 57   Ht 5\' 7"  (1.702 m)   Wt 223 lb 8 oz (101.4 kg)   BMI 35.01 kg/m  , BMI Body mass index is 35.01 kg/m. GEN: Well nourished, well developed, in no acute distress , walking with a limp HEENT: normal  Neck: no JVD, carotid bruits, or masses Cardiac: Irregularly irregular, no murmurs, rubs, or gallops,no edema  Respiratory:  clear to auscultation bilaterally, normal work of breathing GI: soft, nontender, nondistended, + BS MS: no deformity or atrophy,  unable to perform full range of motion Skin: warm and dry, no rash Neuro:  Strength was not fully tested secondary to significant hip pain, back pain Psych: euthymic mood, full affect    Recent Labs: 06/06/2016: B Natriuretic Peptide 98.0; Magnesium 1.9; TSH 1.467 12/07/2016: ALT 27 12/21/2016: BUN 17; Creatinine, Ser 1.37; Hemoglobin 11.3; Platelets 248; Potassium 3.9; Sodium 132    Lipid Panel No results found for: CHOL, HDL, LDLCALC, TRIG    Wt Readings from Last 3 Encounters:  04/09/17 223 lb 8 oz (101.4 kg)  02/13/17 227 lb (103 kg)  01/18/17 216 lb (98 kg)       ASSESSMENT AND PLAN:   Coronary artery disease involving native coronary artery of native heart without angina pectoris - Plan: EKG 12-Lead, ECHOCARDIOGRAM  COMPLETE Echocardiogram with anterior wall hypokinesis, old MI Stress test showing no ischemia Currently with no symptoms of angina. No further workup at this time. Continue current medication regimen.  Persistent atrial fibrillation ([place]) - Plan: ECHOCARDIOGRAM COMPLETE Tolerating warfarin No plan at this time to try to restore normal sinus rhythm as he is relatively asymptomatic  Secondary hypertension, unspecified Blood pressure is well controlled on today's visit. No medication changes  Hyperlipidemia Encouraged him to stay on his Lipitor  S/P CABG x 3 Stress test as above, no ischemia  Shortness of breath Improved symptoms with rate control and Lasix  Chronic low back pain We called the office of Dr. Carloyn Manner and they indicated the patient needed MRI not a CT scan. We discussed this with the patient and he does not want MRI at this time as he was "told he only needed a CT scan". We have recommended he follow-up primary care for further discussion    Total encounter time more than 25 minutes  Greater than 50% was spent in counseling and coordination of care with the patient   Disposition:   F/U  6 months   Orders Placed This Encounter  Procedures  . EKG 12-Lead     Signed, Esmond Plants, M.D., Ph.D. 04/09/2017  [place], [place]

## 2017-04-09 ENCOUNTER — Ambulatory Visit (INDEPENDENT_AMBULATORY_CARE_PROVIDER_SITE_OTHER): Payer: Medicare Other | Admitting: Cardiovascular Disease

## 2017-04-09 ENCOUNTER — Encounter: Payer: Self-pay | Admitting: Cardiovascular Disease

## 2017-04-09 VITALS — BP 120/60 | HR 57 | Ht 67.0 in | Wt 223.5 lb

## 2017-04-09 DIAGNOSIS — E7800 Pure hypercholesterolemia, unspecified: Secondary | ICD-10-CM

## 2017-04-09 DIAGNOSIS — F419 Anxiety disorder, unspecified: Secondary | ICD-10-CM

## 2017-04-09 DIAGNOSIS — Z96642 Presence of left artificial hip joint: Secondary | ICD-10-CM | POA: Diagnosis not present

## 2017-04-09 DIAGNOSIS — I25118 Atherosclerotic heart disease of native coronary artery with other forms of angina pectoris: Secondary | ICD-10-CM

## 2017-04-09 DIAGNOSIS — Z7189 Other specified counseling: Secondary | ICD-10-CM | POA: Diagnosis not present

## 2017-04-09 DIAGNOSIS — Z951 Presence of aortocoronary bypass graft: Secondary | ICD-10-CM | POA: Diagnosis not present

## 2017-04-09 DIAGNOSIS — I10 Essential (primary) hypertension: Secondary | ICD-10-CM | POA: Diagnosis not present

## 2017-04-09 DIAGNOSIS — I4891 Unspecified atrial fibrillation: Secondary | ICD-10-CM | POA: Diagnosis not present

## 2017-04-09 DIAGNOSIS — M545 Low back pain, unspecified: Secondary | ICD-10-CM

## 2017-04-09 DIAGNOSIS — I209 Angina pectoris, unspecified: Secondary | ICD-10-CM | POA: Diagnosis not present

## 2017-04-09 DIAGNOSIS — M3130 Wegener's granulomatosis without renal involvement: Secondary | ICD-10-CM | POA: Diagnosis not present

## 2017-04-09 NOTE — Patient Instructions (Addendum)
We will make a referral to Dr. Glenna Fellows They will need an MRI of your back before they will schedule this appointment Their # is [phone] is you would like more information  Medication Instructions:   No medication changes made  Labwork:  No new labs needed  Testing/Procedures:  Dr. Rex Kras office asked that you have an MRI of your lower back before they will schedule your appointment Please call back if you would like this to be scheduled.   I recommend watching educational videos on topics of interest to you at:       [email or web address]  Enter code: [email or web address]    Follow-Up: It was a pleasure seeing you in the office today. Please call us if you have new issues that need to be addressed before your next appt.  [phone]  Your physician wants you to follow-up in: 6 months.  You will receive a reminder letter in the mail two months in advance. If you don't receive a letter, please call our office to schedule the follow-up appointment.  If you need a refill on your cardiac medications before your next appointment, please call your pharmacy.

## 2017-04-10 ENCOUNTER — Telehealth: Payer: Self-pay | Admitting: Cardiovascular Disease

## 2017-04-10 NOTE — Telephone Encounter (Signed)
Patient says Jesus Hendrix referred to back specialist in [facility] Dr. Carloyn Manner.  Patient has problems doing mri as he has pain when laying still for that long of a time frame.    Patient wants Dr. [name] to order a CT scan instead to do outpatient prior to upcoming consultation.  Please call to discuss.

## 2017-04-10 NOTE — Telephone Encounter (Signed)
I discussed this at length w/ pt yesterday.  I called Dr. Rex Kras office to schedule his appt and set up his CT, but they requested an MRI. Pt was provided w/ # to call and discuss this further w/ their office. Advised him that our office is cardiology and I am unfamiliar w/ ordering images on his back.

## 2017-04-12 NOTE — Telephone Encounter (Signed)
Pt called office stating he is only needing some other tests done to help him with his walking.  Stated to patient he needs to call his PCP we can't order this test, it is not dealing with heart. He stated that was fine and he was okay and he would call them to help with this Nothing further needed.

## 2017-04-23 ENCOUNTER — Telehealth (INDEPENDENT_AMBULATORY_CARE_PROVIDER_SITE_OTHER): Payer: Self-pay | Admitting: Orthopaedic Surgery

## 2017-04-23 NOTE — Telephone Encounter (Signed)
Called-will make appt to see in office

## 2017-04-23 NOTE — Telephone Encounter (Signed)
Patient has continuously deterrated since last hip surgery. GP has scheduled him for Back CT scan today due to decreased mobillity with leg. Patient has been in touch with Dr. Ernestina Patches for post CT scan. Daughter concerned with dads anxiety level due to decreased mobility. GP prescribed meds for this. Patient has fallen several times, and has foot drop. Patient using walker.  Please call patient to discuss, or daughter.

## 2017-04-23 NOTE — Telephone Encounter (Signed)
Please advise 

## 2017-04-26 ENCOUNTER — Ambulatory Visit (INDEPENDENT_AMBULATORY_CARE_PROVIDER_SITE_OTHER): Payer: Medicare Other

## 2017-04-26 ENCOUNTER — Ambulatory Visit (INDEPENDENT_AMBULATORY_CARE_PROVIDER_SITE_OTHER): Payer: Medicare Other | Admitting: Specialist

## 2017-04-26 ENCOUNTER — Encounter (INDEPENDENT_AMBULATORY_CARE_PROVIDER_SITE_OTHER): Payer: Self-pay | Admitting: Orthopaedic Surgery

## 2017-04-26 ENCOUNTER — Encounter (INDEPENDENT_AMBULATORY_CARE_PROVIDER_SITE_OTHER): Payer: Self-pay

## 2017-04-26 ENCOUNTER — Ambulatory Visit (INDEPENDENT_AMBULATORY_CARE_PROVIDER_SITE_OTHER): Payer: Medicare Other | Admitting: Orthopaedic Surgery

## 2017-04-26 VITALS — BP 108/58 | HR 80 | Resp 14 | Ht 67.0 in | Wt 218.0 lb

## 2017-04-26 DIAGNOSIS — I25118 Atherosclerotic heart disease of native coronary artery with other forms of angina pectoris: Secondary | ICD-10-CM | POA: Diagnosis not present

## 2017-04-26 DIAGNOSIS — M4150 Other secondary scoliosis, site unspecified: Secondary | ICD-10-CM | POA: Diagnosis not present

## 2017-04-26 DIAGNOSIS — M4317 Spondylolisthesis, lumbosacral region: Secondary | ICD-10-CM

## 2017-04-26 DIAGNOSIS — M5442 Lumbago with sciatica, left side: Secondary | ICD-10-CM

## 2017-04-26 DIAGNOSIS — M5416 Radiculopathy, lumbar region: Secondary | ICD-10-CM | POA: Diagnosis not present

## 2017-04-26 NOTE — Progress Notes (Signed)
Office Visit Note   Patient: Jesus Hendrix           Date of Birth: [birth year]           MRN: [id number] Visit Date: 04/26/2017              Requested by: Lenard Simmer, MD [address] [place], [place] [address] PCP: Lenard Simmer, MD   Assessment & Plan: Visit Diagnoses:  1. Acute bilateral low back pain with left-sided sciatica   2. Lumbar radiculopathy   3. 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 Spondylolisthesis, lumbosacral region     Plan:  Discussion was had with Dr. Ernestina Patches who would not be able to see him till next week for epidurals.  We contacted Dr. Otho Ket assistant and apparently there was a cancellation and Jesus Hendrix was directed to go to Dr. Otho Ket office immediately.  Follow-Up Instructions: Return if symptoms worsen or fail to improve.   Orders:  Orders Placed This Encounter  Procedures  . XR Pelvis 1-2 Views  . XR Lumbar Spine 2-3 Views   No orders of the defined types were placed in this encounter.     Procedures: No procedures performed   Clinical Data: No additional findings.   Subjective: Chief Complaint  Patient presents with  . Left Hip - Routine Post Op    Jesus Hendrix is a 71 y o status post 4 months Left total hip replacement. Jesus Hendrix relates Jesus Hendrix is concerned with what is going on with his pain in the low back. Pain mainly in walking and raising his L leg radiating in let hip. Ambulating with quad cane, pt drivin  . Lower Back - Pain    Jesus Hendrix is seen today for evaluation. Jesus Hendrix apparently has had a CT scan recently for his back pain and bilateral leg pain.  CT scan from 04/23/2017 revealed Multilevel lumbar spine degenerative disease including severe spinal canal stenosis from L2-3 through L4-5 and multiple levels of severe neural foraminal narrowing. 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   Jesus Hendrix comes in today quite symptomatic. Denies any bowel or bladder incontinence. Jesus Hendrix just complains of pain  in the posterior aspect of both of his thighs and buttock area. Denies any numbness at this time.    Review of Systems  All other systems reviewed and are negative.    Objective: Vital Signs: BP (!) 108/58   Pulse 80   Resp 14   Ht 5\' 7"  (1.702 m)   Wt 218 lb (98.9 kg)   BMI 34.14 kg/m   Physical Exam  Constitutional: Jesus Hendrix is oriented to person, place, and time. Jesus Hendrix appears well-developed and well-nourished.  HENT:  Head: Normocephalic and atraumatic.  Eyes: EOM are normal. Pupils are equal, round, and reactive to light.  Pulmonary/Chest: Effort normal.  Neurological: Jesus Hendrix is alert and oriented to person, place, and time.  Skin: Skin is warm and dry.  Psychiatric: Jesus Hendrix has a normal mood and affect. His behavior is normal. Judgment and thought content normal.    Ortho Exam   Today Jesus Hendrix has mildly positive straight leg raising bilaterally. Jesus Hendrix has good strength in quads and hamstrings. However his right leg reveals weakness in dorsiflexion of the foot in comparison to the left. Plantar flexion is good bilaterally. Deep tendon reflexes were not obtainable in the lower extremities and with enhancement. Sensation Jesus Hendrix states is intact in both legs equal bilateral.  Specialty Comments:  No specialty comments available.  Imaging:  Xr Lumbar Spine 2-3 Views  Result Date: 04/26/2017 Two-view x-ray lumbar spine reveals degenerative scoliosis apex to the left. Significant degenerative changes throughout the entire lumbar spine. Grade 2 spondylo-L5 on S1. Retrolisthesis L3 on 4. Significant degenerative the disc disease in this space narrowing L3, L2, L1, T12  Xr Pelvis 1-2 Views  Result Date: 04/26/2017 AP pelvis reveals bilateral total hip replacements in place. There appears to be some superior migration of the femoral head in the acetabular component on the right.    PMFS History: Patient Active Problem List   Diagnosis Date Noted  . Encounter for anticoagulation discussion and  counseling 04/08/2017  . Avascular necrosis of bone of hip, left ([place]) 12/19/2016  . Status post total replacement of left hip 12/19/2016  . Long term (current) use of anticoagulants [Z79.01] 08/09/2016  . New onset a-fib ([place]) 06/06/2016  . Anxiety 06/06/2016  . Hip pain 06/06/2016  . Shortness of breath   . Preop cardiovascular exam 11/15/2015  . Special screening for malignant neoplasms, colon   . Benign neoplasm of sigmoid colon   . Rectal polyp   . Mild aortic valve stenosis 07/13/2014  . Carotid stenosis 07/13/2014  . Near syncope 04/15/2014  . Murmur 04/15/2014  . Bruit 04/15/2014  . Bradycardia 04/15/2014  . LBBB (left bundle branch block) 04/15/2014  . First degree AV block 04/15/2014  . Wegener's granulomatosis ([place]) 05/23/2013  . CAD (coronary artery disease) 01/08/2012  . 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 Hyperlipidemia 01/08/2012  . HTN (hypertension) 01/08/2012   Past Medical History:  Diagnosis Date  . Anxiety   . Aortic stenosis    a. mild on echo 04/2014  . Atrial fibrillation ([place]) 06/06/2016   a. newly diagnosed 06/06/2016; b. CHADS2VASc => 5 (CHF, HTN, age x 1, DM, vascular disease)  . Choledocholithiasis   . Chronic kidney disease   . Chronic systolic CHF (congestive heart failure) ([place])    a. echo 04/2014: EF 45-50%, nl LV dia fxn, mild AS, LA mildly dilated, PASP nl  . COPD (chronic obstructive pulmonary disease) ([place])    Not on home oxygen  . Coronary artery disease    a. status post CABG in 2007; b. nuc stress test 2014: small region of mild ischemia in the inferior territory, EF 61%, no ischemia, scan similar to prior scan in 2009  . Exogenous obesity   . GERD (gastroesophageal reflux disease)   . Gout   . Hiatal hernia   . Hip pain, chronic 08/03/2016   Waiting to have hip replacement pending NM stress test results per patient.  . History of colonic diverticulitis   . History of renal cell carcinoma    a. s/p laser ablation   . Hyperlipidemia   .  Hypertension   . Near syncope    a. 3 separate events in 2017  . Nephrolithiasis   . Panic attacks   . Spinal stenosis   . Temporomandibular joint pain dysfunction syndrome   . Umbilical hernia   . Vitamin D deficiency   . Wegener's granulomatosis with renal involvement ([place])     Family History  Problem Relation Age of Onset  . Heart disease Mother   . Heart disease Sister   . Heart disease Brother     Past Surgical History:  Procedure Laterality Date  . CARDIAC CATHETERIZATION    . CHOLECYSTECTOMY    . COLONOSCOPY N/A 06/03/2015   Procedure: COLONOSCOPY;  Surgeon: Lucilla Lame, MD;  Location: [place];  Service: Gastroenterology;  Laterality: N/A;  . CORONARY ARTERY BYPASS GRAFT  2004   CABG x 3  . EYE SURGERY    . HERNIA REPAIR     x 2  . HIP SURGERY     right hip replacement  . INTRAOCULAR LENS INSERTION    . KIDNEY SURGERY     cancer  . NOSE SURGERY    . POLYPECTOMY  06/03/2015   Procedure: POLYPECTOMY;  Surgeon: Lucilla Lame, MD;  Location: [place] [place];  Service: Gastroenterology;;  . PROSTATE SURGERY    . TOTAL HIP ARTHROPLASTY Left 12/19/2016   Procedure: LEFT TOTAL HIP ARTHROPLASTY;  Surgeon: Garald Balding, MD;  Location: [place];  Service: Orthopedics;  Laterality: Left;   Social History   Occupational History  . Not on file.   Social History Main Topics  . Smoking status: Former Smoker    Packs/day: 0.50    Years: 25.00    Types: Cigarettes    Quit date: 12/29/1974  . Smokeless tobacco: Never Used  . Alcohol use 1.2 oz/week    2 Cans of beer per week     Comment: social  . Drug use: No  . Sexual activity: Not on file

## 2017-04-27 ENCOUNTER — Ambulatory Visit (INDEPENDENT_AMBULATORY_CARE_PROVIDER_SITE_OTHER): Payer: Medicare Other | Admitting: Specialist

## 2017-04-27 ENCOUNTER — Encounter (INDEPENDENT_AMBULATORY_CARE_PROVIDER_SITE_OTHER): Payer: Self-pay | Admitting: Specialist

## 2017-04-27 VITALS — BP 115/70 | HR 51 | Ht 67.0 in | Wt 218.0 lb

## 2017-04-27 DIAGNOSIS — M5416 Radiculopathy, lumbar region: Secondary | ICD-10-CM | POA: Diagnosis not present

## 2017-04-27 DIAGNOSIS — I25118 Atherosclerotic heart disease of native coronary artery with other forms of angina pectoris: Secondary | ICD-10-CM

## 2017-04-27 DIAGNOSIS — M48061 Spinal stenosis, lumbar region without neurogenic claudication: Secondary | ICD-10-CM

## 2017-04-27 DIAGNOSIS — M4125 Other idiopathic scoliosis, thoracolumbar region: Secondary | ICD-10-CM | POA: Diagnosis not present

## 2017-04-27 MED ORDER — GABAPENTIN 100 MG PO CAPS: 100.0000 mg | ORAL_CAPSULE | Freq: Every day | ORAL | 3 refills | Status: DC

## 2017-04-27 NOTE — Patient Instructions (Signed)
Avoid bending, stooping and avoid lifting weights greater than 10 lbs. Avoid prolong standing and walking. Avoid frequent bending and stooping  No lifting greater than 10 lbs. May use ice or moist heat for pain. Weight loss is of benefit. Handicap license is approved. Dr. Newton's secretary/Assistant will call to arrange for epidural steroid injection  

## 2017-04-27 NOTE — Progress Notes (Signed)
Office Visit Note   Patient: Jesus Hendrix           Date of Birth: 02-23-1946           MRN: [id number] Visit Date: 04/27/2017              Requested by: Lenard Simmer, MD [address] [place], [place] [address] PCP: Lenard Simmer, MD   Assessment & Plan: Visit Diagnoses:  1. Spinal stenosis of lumbar region, unspecified whether neurogenic claudication present   2. Radiculopathy, lumbar region   3. Idiopathic scoliosis of thoracolumbar region     Plan:Avoid bending, stooping and avoid lifting weights greater than 10 lbs. Avoid prolong standing and walking. Avoid frequent bending and stooping  No lifting greater than 10 lbs. May use ice or moist heat for pain. Weight loss is of benefit. Handicap license is approved. Dr. Romona Curls secretary/Assistant will call to arrange for epidural steroid injection    Follow-Up Instructions: Return in about 4 weeks (around 05/25/2017).   Orders:  No orders of the defined types were placed in this encounter.  No orders of the defined types were placed in this encounter.     Procedures: No procedures performed   Clinical Data: Findings:  CT scan report reviewed shows severe spinal stenosis centrally L3-4, L4-5 and foramenal L5-S1 with Grade 2 spondylolisthesis L5-S1, Thoracolumbar scoliosis with multiple level degenerative    Subjective: Chief Complaint  Patient presents with  . Lower Back - Follow-up    71 year old male status post bilateral hip replacement by Dr. Durward Fortes the last left hip done 12/2016. Now the left and right hip is feeling good. He has limitations now with prolong standing and walking. Pain is relieved with bending, stooping, leaning on carts and sittings. Can not stand long like walking in the [employer] to the back of the store and back. Using a walker the legs and back felt better. Not able to use the can as much. No numbness or tingling except in the left great toe. No bowel or bladder difficulties.  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 He is hard of hearing and his wife helps to get information across. Uses a cane in the right hand. Seen by Dr. Tami Ribas for hearing testing. He is able to drive. Does construciton, house fitting work. Okay to sit, its the standing and walking that is troublesome. Moving his legs while sitting is comfortable. Standing he feels the legs tiring and fatigue in the buttocks and upper buttocks. Trouble walking from car to the office. Post surgery he was walking better.    Review of Systems  Constitutional: Negative.   HENT: Negative.   Eyes: Negative.   Respiratory: Negative.   Cardiovascular: Negative.   Gastrointestinal: Negative.   Endocrine: Negative.   Genitourinary: Negative.   Musculoskeletal: Negative.   Skin: Negative.   Allergic/Immunologic: Negative.   Neurological: Negative.   Hematological: Negative.   Psychiatric/Behavioral: Negative.      Objective: Vital Signs: BP 115/70 (BP Location: Left Arm, Patient Position: Sitting)   Pulse (!) 51   Ht 5\' 7"  (1.702 m)   Wt 218 lb (98.9 kg)   BMI 34.14 kg/m   Physical Exam  Constitutional: He is oriented to person, place, and time. He appears well-developed and well-nourished.  HENT:  Head: Normocephalic and atraumatic.  Eyes: EOM are normal. Pupils are equal, round, and reactive to light.  Neck: Normal range of  motion. Neck supple.  Pulmonary/Chest: Effort normal and breath sounds normal.  Abdominal: Soft. Bowel sounds are normal.  Neurological: He is alert and oriented to person, place, and time.  Skin: Skin is warm and dry.  Psychiatric: He has a normal mood and affect. His behavior is normal. Judgment and thought content normal.    Back Exam   Tenderness  The patient is experiencing tenderness in the lumbar.  Range of Motion  Extension: abnormal  Flexion: normal  Lateral Bend Right: abnormal  Lateral Bend Left: abnormal    Rotation Right: abnormal  Rotation Left: abnormal   Muscle Strength  Right Quadriceps:  5/5  Left Quadriceps:  5/5  Right Hamstrings:  5/5  Left Hamstrings:  5/5   Tests  Straight leg raise right: negative Straight leg raise left: negative  Reflexes  Patellar: Hyporeflexic Achilles: Hyporeflexic Babinski's sign: normal   Other  Toe Walk: normal Heel Walk: normal Sensation: normal Gait: normal  Erythema: no back redness Scars: absent  Comments:  Weakness bilateral foot dorsiflexion 4/5, EHL weakness left greater than right 3/5 vs 4/5.      Specialty Comments:  No specialty comments available.  Imaging: Xr Lumbar Spine 2-3 Views  Result Date: 04/26/2017 Two-view x-ray lumbar spine reveals degenerative scoliosis apex to the left. Significant degenerative changes throughout the entire lumbar spine. Grade 2 spondylo-L5 on S1. Retrolisthesis L3 on 4. Significant degenerative the disc disease in this space narrowing L3, L2, L1, T12  Xr Pelvis 1-2 Views  Result Date: 04/26/2017 AP pelvis reveals bilateral total hip replacements in place. There appears to be some superior migration of the femoral head in the acetabular component on the right.    PMFS History: Patient Active Problem List   Diagnosis Date Noted  . Encounter for anticoagulation discussion and counseling 04/08/2017  . Avascular necrosis of bone of hip, left ([place]) 12/19/2016  . Status post total replacement of left hip 12/19/2016  . Long term (current) use of anticoagulants [Z79.01] 08/09/2016  . New onset a-fib ([place]) 06/06/2016  . Anxiety 06/06/2016  . Hip pain 06/06/2016  . Shortness of breath   . Preop cardiovascular exam 11/15/2015  . Special screening for malignant neoplasms, colon   . Benign neoplasm of sigmoid colon   . Rectal polyp   . Mild aortic valve stenosis 07/13/2014  . Carotid stenosis 07/13/2014  . Near syncope 04/15/2014  . Murmur 04/15/2014  . Bruit 04/15/2014  . Bradycardia  04/15/2014  . LBBB (left bundle branch block) 04/15/2014  . First degree AV block 04/15/2014  . Wegener's granulomatosis ([place]) 05/23/2013  . CAD (coronary artery disease) 01/08/2012  . 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 Hyperlipidemia 01/08/2012  . HTN (hypertension) 01/08/2012   Past Medical History:  Diagnosis Date  . Anxiety   . Aortic stenosis    a. mild on echo 04/2014  . Atrial fibrillation ([place]) 06/06/2016   a. newly diagnosed 06/06/2016; b. CHADS2VASc => 5 (CHF, HTN, age x 1, DM, vascular disease)  . Choledocholithiasis   . Chronic kidney disease   . Chronic systolic CHF (congestive heart failure) ([place])    a. echo 04/2014: EF 45-50%, nl LV dia fxn, mild AS, LA mildly dilated, PASP nl  . COPD (chronic obstructive pulmonary disease) ([place])    Not on home oxygen  . Coronary artery disease    a. status post CABG in 2007; b. nuc stress test 2014: small region of mild ischemia in the inferior territory, EF 61%, no ischemia, scan  similar to prior scan in 2009  . Exogenous obesity   . GERD (gastroesophageal reflux disease)   . Gout   . Hiatal hernia   . Hip pain, chronic 08/03/2016   Waiting to have hip replacement pending NM stress test results per patient.  . History of colonic diverticulitis   . History of renal cell carcinoma    a. s/p laser ablation   . Hyperlipidemia   . Hypertension   . Near syncope    a. 3 separate events in 2017  . Nephrolithiasis   . Panic attacks   . Spinal stenosis   . Temporomandibular joint pain dysfunction syndrome   . Umbilical hernia   . Vitamin D deficiency   . Wegener's granulomatosis with renal involvement ([place])     Family History  Problem Relation Age of Onset  . Heart disease Mother   . Heart disease Sister   . Heart disease Brother     Past Surgical History:  Procedure Laterality Date  . CARDIAC CATHETERIZATION    . CHOLECYSTECTOMY    . COLONOSCOPY N/A 06/03/2015   Procedure: COLONOSCOPY;  Surgeon: Lucilla Lame, MD;  Location: [facility];  Service: Gastroenterology;  Laterality: N/A;  . CORONARY ARTERY BYPASS GRAFT  2004   CABG x 3  . EYE SURGERY    . HERNIA REPAIR     x 2  . HIP SURGERY     right hip replacement  . INTRAOCULAR LENS INSERTION    . KIDNEY SURGERY     cancer  . NOSE SURGERY    . POLYPECTOMY  06/03/2015   Procedure: POLYPECTOMY;  Surgeon: Lucilla Lame, MD;  Location: [facility];  Service: Gastroenterology;;  . PROSTATE SURGERY    . TOTAL HIP ARTHROPLASTY Left 12/19/2016   Procedure: LEFT TOTAL HIP ARTHROPLASTY;  Surgeon: Garald Balding, MD;  Location: [facility];  Service: Orthopedics;  Laterality: Left;   Social History   Occupational History  . Not on file.   Social History Main Topics  . Smoking status: Former Smoker    Packs/day: 0.50    Years: 25.00    Types: Cigarettes    Quit date: 12/29/1974  . Smokeless tobacco: Never Used  . Alcohol use 1.2 oz/week    2 Cans of beer per week     Comment: social  . Drug use: No  . Sexual activity: Not on file

## 2017-05-02 ENCOUNTER — Ambulatory Visit (INDEPENDENT_AMBULATORY_CARE_PROVIDER_SITE_OTHER): Payer: Self-pay

## 2017-05-02 ENCOUNTER — Encounter (INDEPENDENT_AMBULATORY_CARE_PROVIDER_SITE_OTHER): Payer: Self-pay | Admitting: Physical Medicine and Rehabilitation

## 2017-05-02 ENCOUNTER — Ambulatory Visit (INDEPENDENT_AMBULATORY_CARE_PROVIDER_SITE_OTHER): Payer: Medicare Other | Admitting: Physical Medicine and Rehabilitation

## 2017-05-02 VITALS — BP 139/68 | HR 64

## 2017-05-02 DIAGNOSIS — M48062 Spinal stenosis, lumbar region with neurogenic claudication: Secondary | ICD-10-CM | POA: Insufficient documentation

## 2017-05-02 DIAGNOSIS — M4316 Spondylolisthesis, lumbar region: Secondary | ICD-10-CM | POA: Diagnosis not present

## 2017-05-02 DIAGNOSIS — M5416 Radiculopathy, lumbar region: Secondary | ICD-10-CM

## 2017-05-02 MED ORDER — LIDOCAINE HCL (PF) 1 % IJ SOLN
2.0000 mL | Freq: Once | INTRAMUSCULAR | Status: AC
  Administered 2017-05-02: 2 mL

## 2017-05-02 MED ORDER — METHYLPREDNISOLONE ACETATE 80 MG/ML IJ SUSP
80.0000 mg | Freq: Once | INTRAMUSCULAR | Status: AC
  Administered 2017-05-02: 80 mg

## 2017-05-02 NOTE — Progress Notes (Deleted)
Increased back  pain since left hip replacement in January. Pain radiating down back of legs to knees. Trouble with walking and standing any length of time. Ambulates with cane. Relief with bending, stopping, and leaning forward holding onto something like shopping cart.Ok with sitting.

## 2017-05-02 NOTE — Patient Instructions (Signed)

## 2017-05-03 NOTE — Procedures (Signed)
Lumbosacral Transforaminal Epidural Steroid Injection - Infraneural Approach with Fluoroscopic Guidance  Patient: Jesus Hendrix      Date of Birth: 21-May-1946 MRN: [id number] PCP: Lenard Simmer, MD      Visit Date: 05/02/2017  Mr. Jesus Hendrix is a pleasant 71 year old gentleman that I have seen over the last several years and treated primarily with left transforaminal epidural steroid injections that we would see him only infrequently. He has had known heart defects bilaterally with almost grade 2 listhesis. He now reports worsening pain since left hip replacement in January. He is radiating pain down the back of both legs to the knees. He has trouble with walking and standing any length of time and is ambulating with a cane. He initially saw Dr. Durward Fortes for follow-up and they felt like he had some nerve weakness in his feet. Dr. Louanne Skye has now seen him for review and didn't want to go ahead with the transforaminal epidural steroid injections. He has had new CT scan of the lumbar spine and this was reviewed. He has severe stenosis at L3-4 and L4-5 and is a grade 2 listhesis of L5 on S1 with significant stenosis of the foramen.  Lumbar exam shows forward flexed lumbar spine with stiffness with extension. He has weakness with dorsiflexion on the right more than left with decent plantar flexion. He does ambulate with a cane with a somewhat unsteady gait.  Universal Protocol:    Date/Time: 05/17/181:44 PM  Consent Given By: the patient  Position: PRONE   Additional Comments: Vital signs were monitored before and after the procedure. Patient was prepped and draped in the usual sterile fashion. The correct patient, procedure, and site was verified.   Injection Procedure Details:  Procedure Site One Meds Administered:  Meds ordered this encounter  Medications  . lidocaine (PF) (XYLOCAINE) 1 % injection 2 mL  . methylPREDNISolone acetate (DEPO-MEDROL) injection 80 mg      Laterality:  Bilateral  Location/Site:  L5-S1  Needle size: 22 G  Needle type: Spinal  Needle Placement: Transforaminal  Findings:  -Contrast Used: 2 mL iohexol 180 mg iodine/mL   -Comments: Excellent flow of contrast along the nerve and into the epidural space. This was on the right side. On the left side there was good flow into the foramen along the nerve but I'm not sure how much of this became epidural due to the angle of approach and the listhesis and the stenosis.  Procedure Details: After squaring off the end-plates of the desired vertebral level to get a true AP view, the C-arm was obliqued to the painful side so that the superior articulating process is positioned about 1/3 the length of the inferior endplate.  The needle was aimed toward the junction of the superior articular process and the transverse process of the inferior vertebrae. The needle's initial entry is in the lower third of the foramen through Kambin's triangle. The soft tissues overlying this target were infiltrated with 2-3 ml. of 1% Lidocaine without Epinephrine.  The spinal needle was then inserted and advanced toward the target using a "trajectory" view along the fluoroscope beam.  Under AP and lateral visualization, the needle was advanced so it did not puncture dura and did not traverse medially beyond the 6 o'clock position of the pedicle. Bi-planar projections were used to confirm position. Aspiration was confirmed to be negative for CSF and/or blood. A 1-2 ml. volume of Isovue-250 was injected and flow of contrast was noted at each level. Radiographs were obtained  for documentation purposes.   After attaining the desired flow of contrast documented above, a 0.5 to 1.0 ml test dose of 0.25% Marcaine was injected into each respective transforaminal space.  The patient was observed for 90 seconds post injection.  After no sensory deficits were reported, and normal lower extremity motor function was noted,   the above injectate  was administered so that equal amounts of the injectate were placed at each foramen (level) into the transforaminal epidural space.   Additional Comments:  No complications occurred Dressing: Band-Aid    Post-procedure details: Patient was observed during the procedure. Post-procedure instructions were reviewed.  Patient left the clinic in stable condition.

## 2017-05-04 ENCOUNTER — Telehealth (INDEPENDENT_AMBULATORY_CARE_PROVIDER_SITE_OTHER): Payer: Self-pay | Admitting: Specialist

## 2017-05-04 NOTE — Telephone Encounter (Signed)
PT DAUGHTER CALLED AND ASKED IF DR [name] CAN INCREASE HIS GABAPENTIN, SHE STATED WHAT HE HAD WAS A LOW DOSE AND THAT IT IS NOT HELPING HIM

## 2017-05-04 NOTE — Telephone Encounter (Signed)
PT DAUGHTER CALLED AND ASKED IF DR [name] CAN INCREASE HIS GABAPENTIN, SHE STATED WHAT HE HAD WAS A LOW DOSE AND THAT IT IS NOT HELPING HIM.   [phone] [place]

## 2017-05-07 ENCOUNTER — Other Ambulatory Visit (INDEPENDENT_AMBULATORY_CARE_PROVIDER_SITE_OTHER): Payer: Self-pay | Admitting: Specialist

## 2017-05-07 ENCOUNTER — Encounter (INDEPENDENT_AMBULATORY_CARE_PROVIDER_SITE_OTHER): Payer: Medicare Other | Admitting: Physical Medicine and Rehabilitation

## 2017-05-07 MED ORDER — GABAPENTIN 100 MG PO CAPS: 200.0000 mg | ORAL_CAPSULE | Freq: Two times a day (BID) | ORAL | 1 refills | Status: DC

## 2017-05-07 NOTE — Telephone Encounter (Signed)
Rx changed and send to this patient's pharmacy, he can increase the dosage to 2 x 100 mg tablet at night and one 100mg  tablet in the morning for one week then 2 x 100mg  tablet at bedtime and before breakfast. He has a history of  Leg swelling and is on a diuretic so I don not recommend large doses of this medication without seeing if he tolerates it.

## 2017-05-07 NOTE — Progress Notes (Signed)
I called and lmovm for Marla that his rx was changed and faxed to the pharm

## 2017-05-16 ENCOUNTER — Telehealth (INDEPENDENT_AMBULATORY_CARE_PROVIDER_SITE_OTHER): Payer: Self-pay | Admitting: Physical Medicine and Rehabilitation

## 2017-05-16 NOTE — Telephone Encounter (Signed)
S1 TF scheduled for 6/7. He will continue warfarin. I also told him that he has to have someone to drive him home. He said he will have a driver.

## 2017-05-16 NOTE — Telephone Encounter (Signed)
Follow Up with Jesus Hendrix - he should have had follow up? Might can try one more but very difficult left sided injection at L5. Depending on [name] follow up would consider S1 or L4 TF esi

## 2017-05-24 ENCOUNTER — Ambulatory Visit (INDEPENDENT_AMBULATORY_CARE_PROVIDER_SITE_OTHER): Payer: Self-pay

## 2017-05-24 ENCOUNTER — Ambulatory Visit (INDEPENDENT_AMBULATORY_CARE_PROVIDER_SITE_OTHER): Payer: Medicare Other | Admitting: Physical Medicine and Rehabilitation

## 2017-05-24 ENCOUNTER — Encounter (INDEPENDENT_AMBULATORY_CARE_PROVIDER_SITE_OTHER): Payer: Self-pay | Admitting: Physical Medicine and Rehabilitation

## 2017-05-24 VITALS — BP 142/64 | HR 63 | Temp 98.4°F

## 2017-05-24 DIAGNOSIS — M48062 Spinal stenosis, lumbar region with neurogenic claudication: Secondary | ICD-10-CM

## 2017-05-24 DIAGNOSIS — M4316 Spondylolisthesis, lumbar region: Secondary | ICD-10-CM

## 2017-05-24 DIAGNOSIS — M5416 Radiculopathy, lumbar region: Secondary | ICD-10-CM | POA: Diagnosis not present

## 2017-05-24 NOTE — Patient Instructions (Signed)

## 2017-05-24 NOTE — Progress Notes (Deleted)
No relief from last injections. Pain is "straight in middle of back like at the tailbone." Pain and numbness from hip to knees both legs. Swelling right calf.

## 2017-05-28 NOTE — Procedures (Signed)
Lumbosacral Transforaminal Epidural Steroid Injection - Infraneural Approach with Fluoroscopic Guidance  Patient: Jesus Hendrix      Date of Birth: 1946-06-24 MRN: [id number] PCP: Lenard Simmer, MD      Visit Date: 05/24/2017   Jesus Hendrix is a 71 year old gentleman that we used to see in the past for grade 2 listhesis with bilateral pars defects and lumbar stenosis. We recently saw him for bilateral L5 transforaminal injections through Dr. Louanne Skye and Dr. Durward Fortes. Those injections did not help very much at all. His pain is in the center of the low back and Patel bone radiating to both hamstrings down worse with numbness tingling. He does have some weakness. He is using a cane.  Today we are going to complete bilateral transforaminal injections at S1 to see if that gives him some more relief. He is to follow up with Dr. Louanne Skye in a few weeks.  Universal Protocol:     Consent Given By: the patient  Position: PRONE   Additional Comments: Vital signs were monitored before and after the procedure. Patient was prepped and draped in the usual sterile fashion. The correct patient, procedure, and site was verified.   Injection Procedure Details:  Procedure Site One Meds Administered: No orders of the defined types were placed in this encounter.     Laterality: Bilateral  Location/Site:  S1-2  Needle size: 22 G  Needle type: Spinal  Needle Placement: Transforaminal  Findings:  -Contrast Used: 1 mL iohexol 180 mg iodine/mL   -Comments: Excellent flow of contrast along the nerve and into the epidural space.  Procedure Details: After squaring off the end-plates of the desired vertebral level to get a true AP view, the C-arm was obliqued to the painful side so that the superior articulating process is positioned about 1/3 the length of the inferior endplate.  The needle was aimed toward the junction of the superior articular process and the transverse process of the inferior vertebrae.  The needle's initial entry is in the lower third of the foramen through Kambin's triangle. The soft tissues overlying this target were infiltrated with 2-3 ml. of 1% Lidocaine without Epinephrine.  The spinal needle was then inserted and advanced toward the target using a "trajectory" view along the fluoroscope beam.  Under AP and lateral visualization, the needle was advanced so it did not puncture dura and did not traverse medially beyond the 6 o'clock position of the pedicle. Bi-planar projections were used to confirm position. Aspiration was confirmed to be negative for CSF and/or blood. A 1-2 ml. volume of Isovue-250 was injected and flow of contrast was noted at each level. Radiographs were obtained for documentation purposes.   After attaining the desired flow of contrast documented above, a 0.5 to 1.0 ml test dose of 0.25% Marcaine was injected into each respective transforaminal space.  The patient was observed for 90 seconds post injection.  After no sensory deficits were reported, and normal lower extremity motor function was noted,   the above injectate was administered so that equal amounts of the injectate were placed at each foramen (level) into the transforaminal epidural space.   Additional Comments:  The patient tolerated the procedure well Dressing: Band-Aid    Post-procedure details: Patient was observed during the procedure. Post-procedure instructions were reviewed.  Patient left the clinic in stable condition.

## 2017-05-31 ENCOUNTER — Ambulatory Visit (INDEPENDENT_AMBULATORY_CARE_PROVIDER_SITE_OTHER): Payer: Medicare Other | Admitting: Specialist

## 2017-06-10 IMAGING — CT CT HEAD W/O CM
3 series · 16 of 47 positions shown, 19 images · non-contrast
Comparison: Report from 01/08/1999

CLINICAL DATA: Near syncope and shortness of breath, possible
anxiety attack.

EXAM:
CT HEAD WITHOUT CONTRAST
TECHNIQUE: Contiguous axial images were obtained from the base of the skull
through the vertex without intravenous contrast.

[Series 2: soft tissue · axial · 0.43mm/px · z∈[-34,+91]mm · 10 of 31 slices shown, 13 images]
[im 3/31  brain]
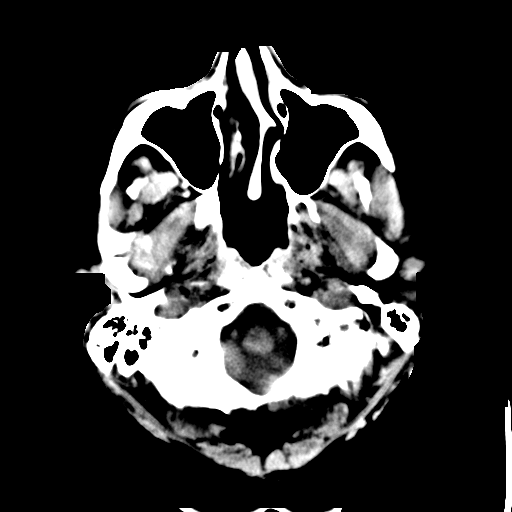
[im 3/31  bone]
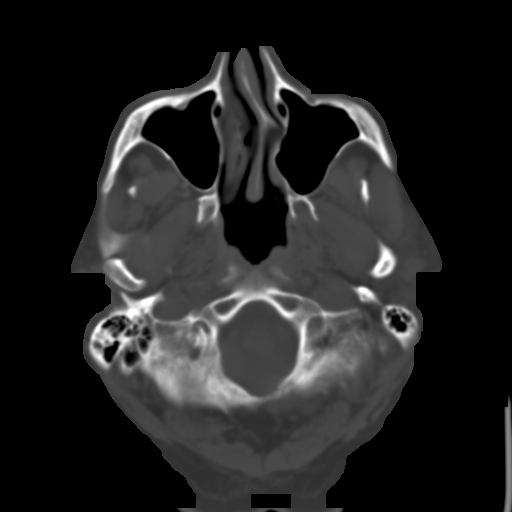
[im 6/31  brain]
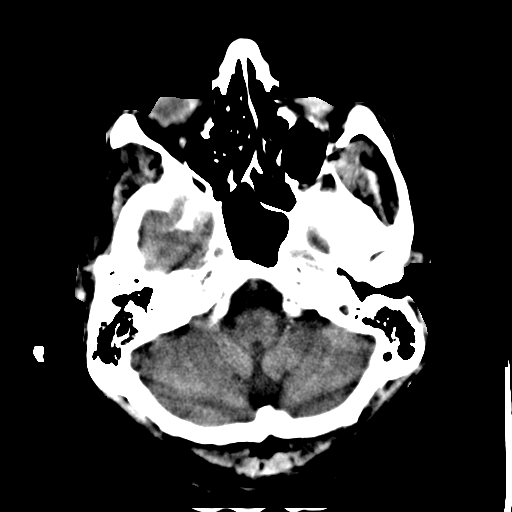
[im 9/31  brain]
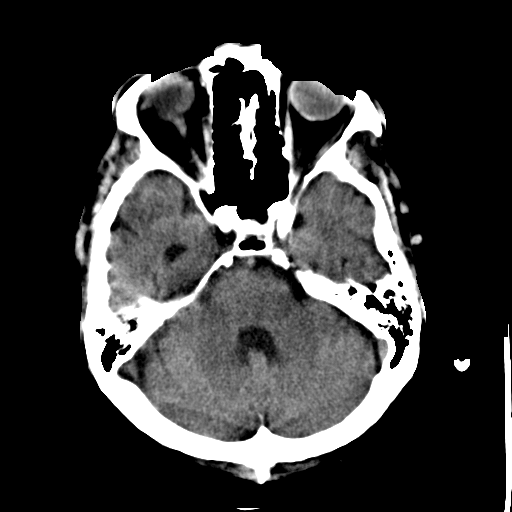
[im 11/31  brain]
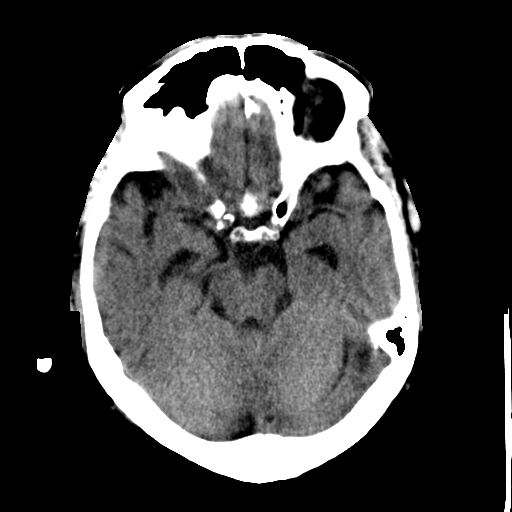
[im 14/31  brain]
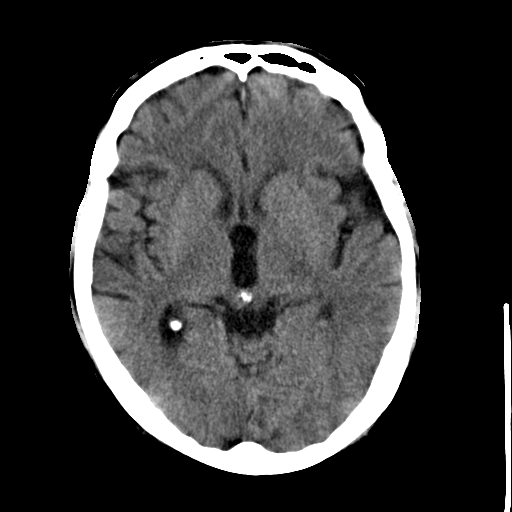
[im 14/31  bone]
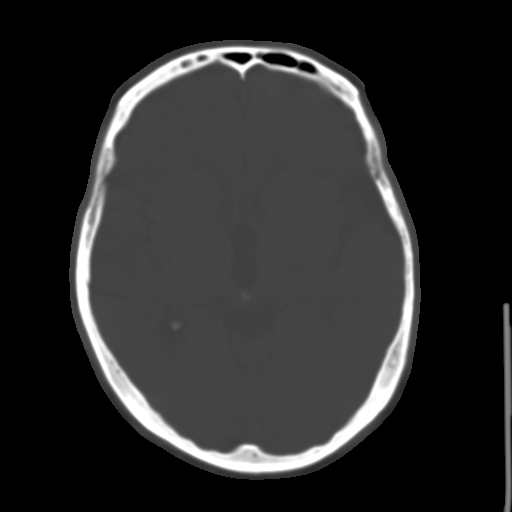
[im 17/31  brain]
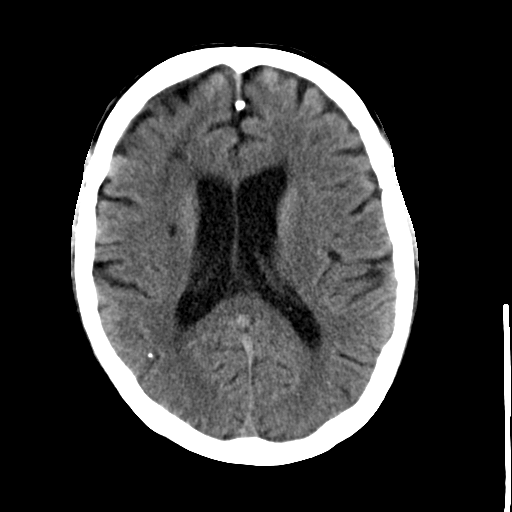
[im 20/31  brain]
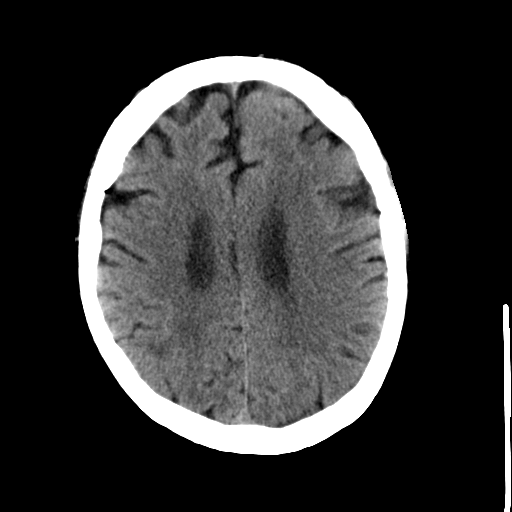
[im 23/31  brain]
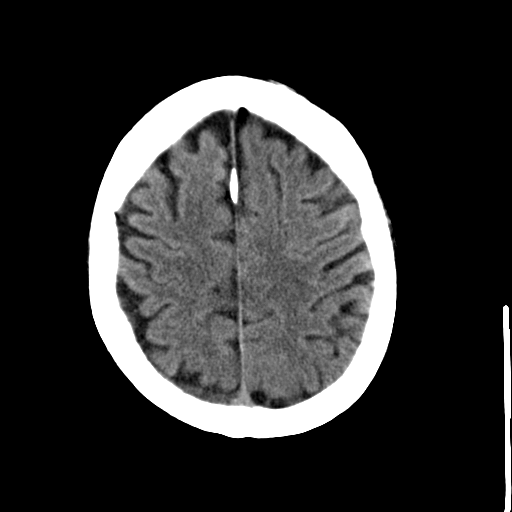
[im 25/31  brain]
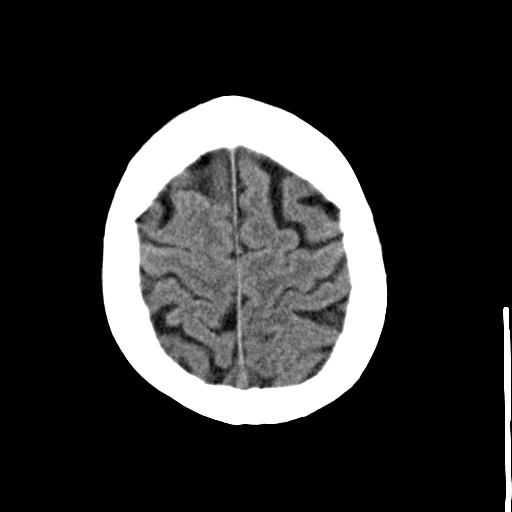
[im 25/31  bone]
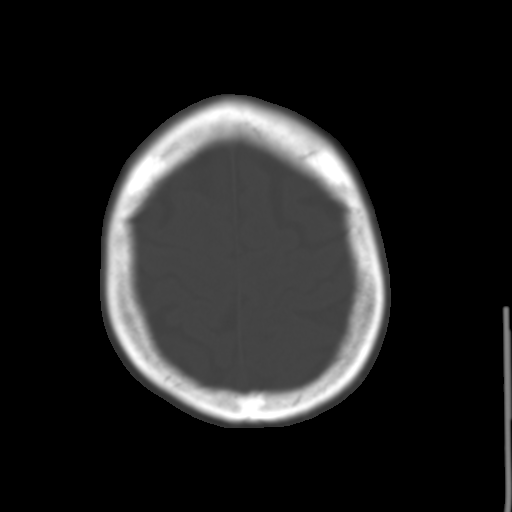
[im 28/31  brain]
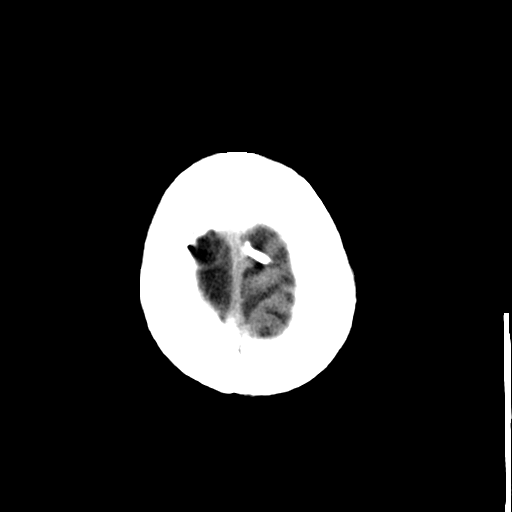

[Series 602: cor · coronal · 0.43mm/px · 3 of 97 slices shown]
[im 33/97  brain]
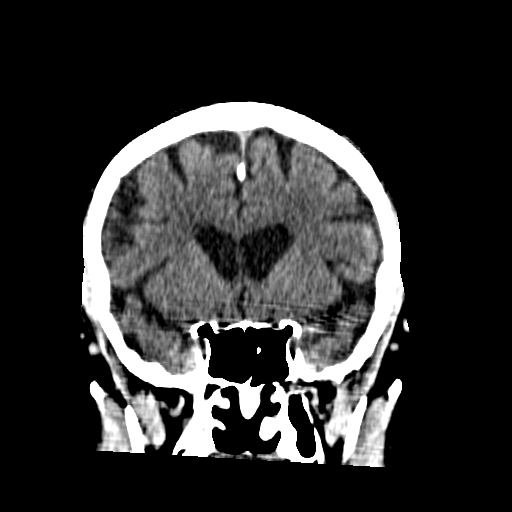
[im 43/97  brain]
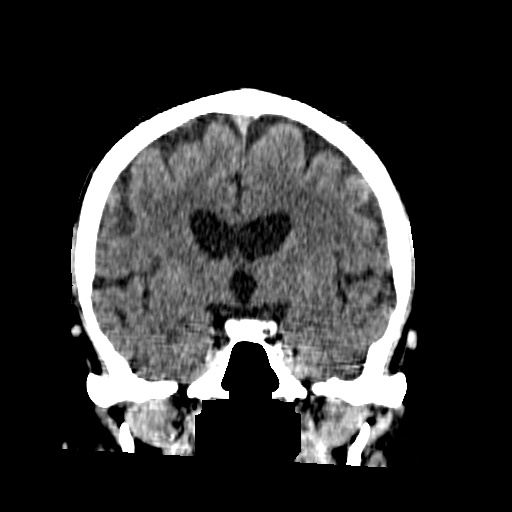
[im 54/97  brain]
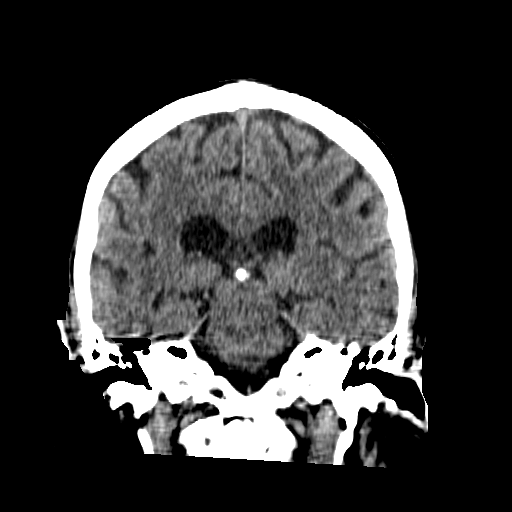

[Series 603: sag · sagittal · 0.43mm/px · 3 of 81 slices shown]
[im 27/81  brain]
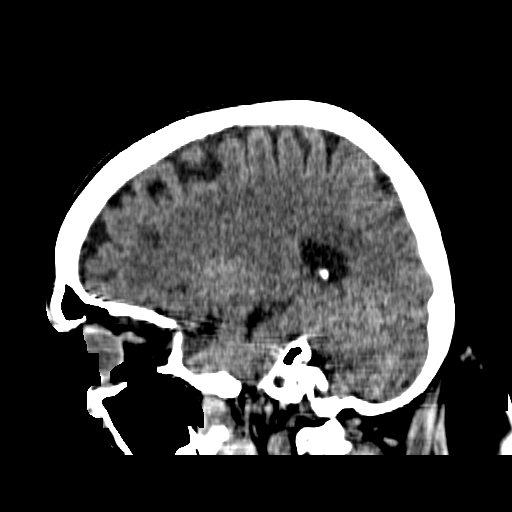
[im 41/81  brain]
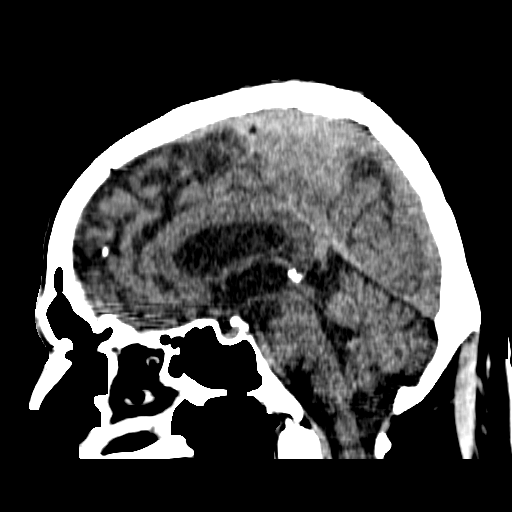
[im 54/81  brain]
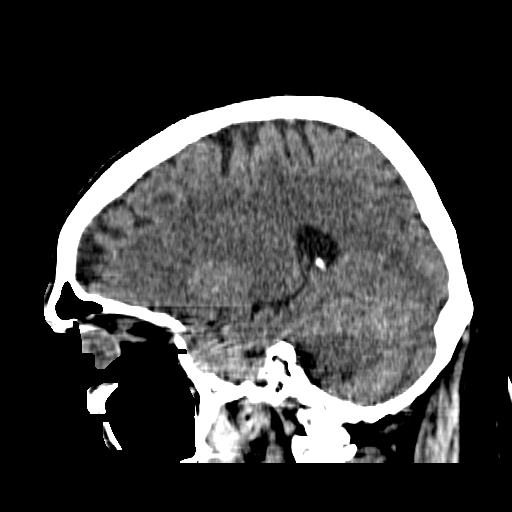

[16 of 47 positions shown; findings below may reference images not displayed]

FINDINGS: Remote lacunar infarcts in the right external capsule and right
periventricular white matter. Periventricular white matter and
corona radiata hypodensities favor chronic ischemic microvascular
white matter disease.

Otherwise, the brainstem, cerebellum, cerebral peduncles, thalami,
basal ganglia, basilar cisterns, and ventricular system appear
within normal limits. No intracranial hemorrhage, mass lesion, or
acute CVA.

There is atherosclerotic calcification of the cavernous carotid
arteries bilaterally. Mucosal thickening in the nasal cavity with 7
mm of leftward nasal septal deviation and right-sided concha
bullosa.
IMPRESSION: 1. No acute intracranial findings.
2. Remote right-sided lacunar infarcts in the periventricular white
matter and external capsule. Periventricular white matter and corona
radiata hypodensities favor chronic ischemic microvascular white
matter disease.
3. Mucosal thickening in the nasal cavity with leftward nasal septal
deviation.

## 2017-06-18 ENCOUNTER — Ambulatory Visit (INDEPENDENT_AMBULATORY_CARE_PROVIDER_SITE_OTHER): Payer: Medicare Other

## 2017-06-18 ENCOUNTER — Telehealth: Payer: Self-pay | Admitting: Cardiovascular Disease

## 2017-06-18 DIAGNOSIS — I25118 Atherosclerotic heart disease of native coronary artery with other forms of angina pectoris: Secondary | ICD-10-CM

## 2017-06-18 DIAGNOSIS — Z7901 Long term (current) use of anticoagulants: Secondary | ICD-10-CM

## 2017-06-18 DIAGNOSIS — I4891 Unspecified atrial fibrillation: Secondary | ICD-10-CM | POA: Diagnosis not present

## 2017-06-18 LAB — POCT INR: INR: 4.7

## 2017-06-18 NOTE — Telephone Encounter (Signed)
Spoke to patient who states he did not realize he has not had his coumadin level checked for sometime until he became very lethargy. He states he never received a call to remind him about missed appt. Since this Wednesday is holiday, no schedule in [place] until next week. He is unable to come to [facility] for INR check. Will add him to nurse schedule today with result to be called to [place] since overdue since March. Pt states understanding and appreciation.

## 2017-06-18 NOTE — Telephone Encounter (Signed)
Pt states he has not had his coumadin checked in a couple of months. Please call and advise when he needs an appt.

## 2017-06-26 ENCOUNTER — Telehealth (INDEPENDENT_AMBULATORY_CARE_PROVIDER_SITE_OTHER): Payer: Self-pay | Admitting: Specialist

## 2017-06-26 NOTE — Telephone Encounter (Signed)
Last 2 ov notes faxed to [facility] supply to support need for lumbar brace Dr. Ernestina Patches signed for

## 2017-06-27 ENCOUNTER — Ambulatory Visit (INDEPENDENT_AMBULATORY_CARE_PROVIDER_SITE_OTHER): Payer: Medicare Other | Admitting: *Deleted

## 2017-06-27 DIAGNOSIS — I4891 Unspecified atrial fibrillation: Secondary | ICD-10-CM

## 2017-06-27 DIAGNOSIS — I25118 Atherosclerotic heart disease of native coronary artery with other forms of angina pectoris: Secondary | ICD-10-CM

## 2017-06-27 DIAGNOSIS — Z7901 Long term (current) use of anticoagulants: Secondary | ICD-10-CM

## 2017-06-27 LAB — POCT INR: INR: 3.5

## 2017-06-28 ENCOUNTER — Telehealth (INDEPENDENT_AMBULATORY_CARE_PROVIDER_SITE_OTHER): Payer: Self-pay | Admitting: Physical Medicine and Rehabilitation

## 2017-06-28 NOTE — Telephone Encounter (Signed)
Scheduled for 7/25. Patient is waiting to hear from Dr. Rex Kras office for a consult.

## 2017-06-28 NOTE — Telephone Encounter (Signed)
Right L4 TF can stay on anticoag if needed, last injection if not helping very long, go ahead with [name] follow up if he does not already have one, ie make it a few weeks after injection

## 2017-07-11 ENCOUNTER — Ambulatory Visit (INDEPENDENT_AMBULATORY_CARE_PROVIDER_SITE_OTHER): Payer: Medicare Other | Admitting: Physical Medicine and Rehabilitation

## 2017-07-11 ENCOUNTER — Encounter (INDEPENDENT_AMBULATORY_CARE_PROVIDER_SITE_OTHER): Payer: Self-pay | Admitting: Physical Medicine and Rehabilitation

## 2017-07-11 ENCOUNTER — Ambulatory Visit (INDEPENDENT_AMBULATORY_CARE_PROVIDER_SITE_OTHER): Payer: Medicare Other

## 2017-07-11 VITALS — BP 121/77 | HR 72

## 2017-07-11 DIAGNOSIS — M4316 Spondylolisthesis, lumbar region: Secondary | ICD-10-CM | POA: Diagnosis not present

## 2017-07-11 DIAGNOSIS — M48062 Spinal stenosis, lumbar region with neurogenic claudication: Secondary | ICD-10-CM

## 2017-07-11 DIAGNOSIS — M5416 Radiculopathy, lumbar region: Secondary | ICD-10-CM | POA: Diagnosis not present

## 2017-07-11 MED ORDER — METHYLPREDNISOLONE ACETATE 80 MG/ML IJ SUSP
80.0000 mg | Freq: Once | INTRAMUSCULAR | Status: AC
  Administered 2017-07-11: 80 mg

## 2017-07-11 MED ORDER — LIDOCAINE HCL (PF) 1 % IJ SOLN
2.0000 mL | Freq: Once | INTRAMUSCULAR | Status: AC
  Administered 2017-07-11: 2 mL

## 2017-07-11 NOTE — Progress Notes (Deleted)
Patient states no pain on left side now. All pain is on the right side. Feels like electric shock when he is sitting and goes to turn or twist. Walking and standing he is fine. No leg pain. Seeing Dr. Carloyn Manner.

## 2017-07-11 NOTE — Patient Instructions (Signed)

## 2017-07-11 NOTE — Progress Notes (Unsigned)
Fluoro Time: 22 sec MGy: 26.71

## 2017-07-12 NOTE — Procedures (Signed)
Jesus Hendrix is a pleasant 71 year old gentleman with multiple medical problems in his history is well documented in several notes over the last few months. We've completed a couple of injections a couple months ago which were transforaminal injections at L5 and S1. He states now that the left side is not giving him any issues right now in terms of pain in her anything down the leg. He is quite pleased with that. He is getting a catching sensation with rotation and pain in the right flank and low back and hip area. He is not getting radicular complaints down the legs. Interestingly on testing his strength today appears to have more strength with dorsiflexion that he had a few months ago when he was tested. 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 He is seeing Dr. Carloyn Manner soon for evaluation. We will go ahead and complete a right L4 transforaminal injection to see if that gives him any relief but I'm unsure at this point the spinal interventions are going to help a lot of his mechanical complaints.   Lumbosacral Transforaminal Epidural Steroid Injection - Infraneural Approach with Fluoroscopic Guidance  Patient: Jesus Hendrix      Date of Birth: Nov 06, 1946 MRN: [id number] PCP: Lenard Simmer, MD      Visit Date: 07/11/2017   Universal Protocol:     Consent Given By: the patient  Position: PRONE   Additional Comments: Vital signs were monitored before and after the procedure. Patient was prepped and draped in the usual sterile fashion. The correct patient, procedure, and site was verified.   Injection Procedure Details:  Procedure Site One Meds Administered:  Meds ordered this encounter  Medications  . lidocaine (PF) (XYLOCAINE) 1 % injection 2 mL  . methylPREDNISolone acetate (DEPO-MEDROL) injection 80 mg      Laterality: Right  Location/Site:  L4-L5  Needle size: 22 G  Needle type: Spinal  Needle Placement:  Transforaminal  Findings:  -Contrast Used: 0.5 mL iohexol 180 mg iodine/mL   -Comments: Excellent flow of contrast along the nerve and into the epidural space.  Procedure Details: After squaring off the end-plates of the desired vertebral level to get a true AP view, the C-arm was obliqued to the painful side so that the superior articulating process is positioned about 1/3 the length of the inferior endplate.  The needle was aimed toward the junction of the superior articular process and the transverse process of the inferior vertebrae. The needle's initial entry is in the lower third of the foramen through Kambin's triangle. The soft tissues overlying this target were infiltrated with 2-3 ml. of 1% Lidocaine without Epinephrine.  The spinal needle was then inserted and advanced toward the target using a "trajectory" view along the fluoroscope beam.  Under AP and lateral visualization, the needle was advanced so it did not puncture dura and did not traverse medially beyond the 6 o'clock position of the pedicle. Bi-planar projections were used to confirm position. Aspiration was confirmed to be negative for CSF and/or blood. A 1-2 ml. volume of Isovue-250 was injected and flow of contrast was noted at each level. Radiographs were obtained for documentation purposes.   After attaining the desired flow of contrast documented above, a 0.5 to 1.0 ml test dose of 0.25% Marcaine was injected into each respective transforaminal space.  The patient was observed for 90 seconds post injection.  After no sensory deficits were reported, and normal lower extremity motor  function was noted,   the above injectate was administered so that equal amounts of the injectate were placed at each foramen (level) into the transforaminal epidural space.   Additional Comments:  The patient tolerated the procedure well Dressing: Band-Aid    Post-procedure details: Patient was observed during the procedure. Post-procedure  instructions were reviewed.  Patient left the clinic in stable condition.

## 2017-07-18 ENCOUNTER — Ambulatory Visit (INDEPENDENT_AMBULATORY_CARE_PROVIDER_SITE_OTHER): Payer: Medicare Other

## 2017-07-18 DIAGNOSIS — I25118 Atherosclerotic heart disease of native coronary artery with other forms of angina pectoris: Secondary | ICD-10-CM

## 2017-07-18 DIAGNOSIS — I4891 Unspecified atrial fibrillation: Secondary | ICD-10-CM

## 2017-07-18 DIAGNOSIS — Z7901 Long term (current) use of anticoagulants: Secondary | ICD-10-CM | POA: Diagnosis not present

## 2017-07-18 LAB — POCT INR: INR: 1.9

## 2017-07-23 ENCOUNTER — Other Ambulatory Visit: Payer: Self-pay | Admitting: Neurosurgery

## 2017-07-23 DIAGNOSIS — M47816 Spondylosis without myelopathy or radiculopathy, lumbar region: Secondary | ICD-10-CM

## 2017-07-27 ENCOUNTER — Ambulatory Visit
Admission: RE | Admit: 2017-07-27 | Discharge: 2017-07-27 | Disposition: A | Discharge status: Home / Self Care | Payer: Medicare Other | Source: Ambulatory Visit | Attending: Neurosurgery | Admitting: Neurosurgery

## 2017-07-27 DIAGNOSIS — M47816 Spondylosis without myelopathy or radiculopathy, lumbar region: Secondary | ICD-10-CM

## 2017-07-27 DIAGNOSIS — M48061 Spinal stenosis, lumbar region without neurogenic claudication: Secondary | ICD-10-CM | POA: Diagnosis not present

## 2017-07-27 DIAGNOSIS — M4316 Spondylolisthesis, lumbar region: Secondary | ICD-10-CM | POA: Insufficient documentation

## 2017-08-08 ENCOUNTER — Ambulatory Visit (INDEPENDENT_AMBULATORY_CARE_PROVIDER_SITE_OTHER): Payer: Medicare Other | Admitting: *Deleted

## 2017-08-08 DIAGNOSIS — Z7901 Long term (current) use of anticoagulants: Secondary | ICD-10-CM | POA: Diagnosis not present

## 2017-08-08 DIAGNOSIS — I25118 Atherosclerotic heart disease of native coronary artery with other forms of angina pectoris: Secondary | ICD-10-CM

## 2017-08-08 DIAGNOSIS — I4891 Unspecified atrial fibrillation: Secondary | ICD-10-CM | POA: Diagnosis not present

## 2017-08-08 LAB — POCT INR: INR: 2

## 2017-09-02 NOTE — Progress Notes (Signed)
[name], male   DOB: 16-Nov-1946, 71 y.o.   MRN: [id number] Cardiology Office Note  Date:  09/03/2017   ID:  Jesus Hendrix, DOB 1946-01-12, MRN [id number]  PCP:  Lenard Simmer, MD   Chief Complaint  Patient presents with  . other    6 month follow up. Patient denies chest pain and SOB. Meds reviewed verbally with patient.     HPI:   Jesus Hendrix is a very pleasant 71 year old gentleman with  Persistent atrial fibrillation chronic back pain and sciatica down his left leg,  coronary artery disease and bypass surgery in 2007,  Wegener's granulomatosis,  hyperlipidemia,  obesity,  kidney cancer,  status post laser ablation at [facility],  previous history of dizziness which he attributes to his Wegener's who presents for routine followup of his coronary artery disease, atrial fibrillation  In follow-up today he reports that he is doing relatively well Denies any chest pain or shortness of breath Taking Lasix twice a day, this was recommendation by nephrology  Review of lab work with him March 2018 shows creatinine climbing up to 1.4, elevated BUN Denies any leg swelling  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   Lab work reviewed personally from primary care March 2018 with him showing total cholesterol 185, LDL 124,  EKG on today's visit shows atrial fibrillation with ventricular rate 86 bpm, left bundle branch block  Echocardiogram reviewed with him showing moderately depressed ejection fraction, anterior wall hypokinesis    issues with anxiety, previously requested Xanax  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 No  significant chest tightness with exertion.  Echocardiogram May 2015 with mild aortic valve stenosis, results were discussed with him  Previous cardiac catheterization in January 2007 details 75-80% long lesion in the LAD at the ostium, 95% lesion in the PDA. He was referred to bypass surgery. 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 Defect worse in the inferoapical region. No ischemia. Ejection fraction 55%. Exercised for 7 METS  Previous history of myalgias on high-dose cholesterol medication. Tolerating Lipitor 30 mg daily  PMH:   has a past medical history of Anxiety; Aortic stenosis; Atrial fibrillation ([place]) (06/06/2016); Choledocholithiasis; Chronic kidney disease; Chronic systolic CHF (congestive heart failure) ([place]); COPD (chronic obstructive pulmonary disease) ([place]); Coronary artery disease; Exogenous obesity; GERD (gastroesophageal reflux disease); Gout; Hiatal hernia; Hip pain, chronic (08/03/2016); History of colonic diverticulitis; History of renal cell carcinoma; Hyperlipidemia; Hypertension; Near syncope; Nephrolithiasis; Panic attacks; Spinal stenosis; Temporomandibular joint pain dysfunction syndrome; Umbilical hernia; Vitamin D deficiency; and Wegener's granulomatosis with renal involvement ([place]).  PSH:    Past Surgical History:  Procedure Laterality Date  . CARDIAC CATHETERIZATION    . CHOLECYSTECTOMY    . COLONOSCOPY N/A 06/03/2015   Procedure: COLONOSCOPY;  Surgeon: Lucilla Lame, MD;  Location: [facility];  Service: Gastroenterology;  Laterality: N/A;  . CORONARY ARTERY BYPASS GRAFT  2004   CABG x 3  . EYE SURGERY    .  HERNIA REPAIR     x 2  . HIP SURGERY     right hip replacement  . INTRAOCULAR LENS INSERTION    . KIDNEY SURGERY      cancer  . NOSE SURGERY    . POLYPECTOMY  06/03/2015   Procedure: POLYPECTOMY;  Surgeon: Lucilla Lame, MD;  Location: [facility];  Service: Gastroenterology;;  . PROSTATE SURGERY    . TOTAL HIP ARTHROPLASTY Left 12/19/2016   Procedure: LEFT TOTAL HIP ARTHROPLASTY;  Surgeon: Garald Balding, MD;  Location: [facility];  Service: Orthopedics;  Laterality: Left;    Current Outpatient Prescriptions  Medication Sig Dispense Refill  . acetaminophen (TYLENOL) 325 MG tablet Take 650 mg by mouth 3 (three) times daily as needed for moderate pain.    Marland Kitchen atorvastatin (LIPITOR) 40 MG tablet Take 1 tablet (40 mg total) by mouth daily. (Patient taking differently: Take 20 mg by mouth daily. ) 90 tablet 4  . Cholecalciferol (VITAMIN D) 2000 units CAPS Take 2,000 Units by mouth daily.    Marland Kitchen ezetimibe (ZETIA) 10 MG tablet Take by mouth.    . fluticasone (FLONASE) 50 MCG/ACT nasal spray Place 2 sprays into both nostrils at bedtime.     . furosemide (LASIX) 20 MG tablet Take 1 tablet (20 mg total) by mouth 2 (two) times daily as needed for fluid or edema. (Patient taking differently: Take 40 mg by mouth daily. ) 60 tablet 6  . losartan (COZAAR) 50 MG tablet Take 25 mg by mouth at bedtime.     . metoprolol succinate (TOPROL-XL) 25 MG 24 hr tablet Take 1 tablet (25 mg total) by mouth daily. 30 tablet 2  . niacin (NIASPAN) 500 MG CR tablet Take 500 mg by mouth daily.    . pantoprazole (PROTONIX) 40 MG tablet Take 40 mg by mouth daily.    . tamsulosin (FLOMAX) 0.4 MG CAPS capsule Take 0.4 mg by mouth at bedtime.     Marland Kitchen warfarin (COUMADIN) 5 MG tablet Take as directed by [facility] 120 tablet 1  . gabapentin (NEURONTIN) 100 MG capsule Take 2 capsules (200 mg total) by mouth 2 (two) times daily at 8 am and 10 pm. (Patient not taking: Reported on 09/03/2017) 60 capsule 1   No current facility-administered medications for this visit.      Allergies:   Klonopin [clonazepam]   Social History:  The patient   reports that he quit smoking about 42 years ago. His smoking use included Cigarettes. He has a 12.50 pack-year smoking history. He has never used smokeless tobacco. He reports that he does not drink alcohol or use drugs.   Family History:   family history includes Heart disease in his brother, mother, and sister.    Review of Systems: Review of Systems  Constitutional: Negative.   Respiratory: Negative.   Cardiovascular: Negative.   Gastrointestinal: Negative.   Musculoskeletal: Positive for back pain and joint pain.       Pain is improving  Neurological: Negative.   All other systems reviewed and are negative.    PHYSICAL EXAM: VS:  BP 135/86 (BP Location: Left Arm, Patient Position: Sitting, Cuff Size: Normal)   Ht 5\' 7"  (1.702 m)   Wt 211 lb 4 oz (95.8 kg)   BMI 33.09 kg/m  , BMI Body mass index is 33.09 kg/m.  GEN: Well nourished, well developed, in no acute distress , walking with a limp HEENT: normal  Neck: no JVD, carotid bruits, or masses Cardiac: Irregularly  irregular, no murmurs, rubs, or gallops,no edema  Respiratory:  clear to auscultation bilaterally, normal work of breathing GI: soft, nontender, nondistended, + BS MS: no deformity or atrophy,  unable to perform full range of motion Skin: warm and dry, no rash Neuro:  Strength was not fully tested secondary to significant hip pain, back pain Psych: euthymic mood, full affect    Recent Labs: 12/07/2016: ALT 27 12/21/2016: BUN 17; Creatinine, Ser 1.37; Hemoglobin 11.3; Platelets 248; Potassium 3.9; Sodium 132    Lipid Panel No results found for: CHOL, HDL, LDLCALC, TRIG    Wt Readings from Last 3 Encounters:  09/03/17 211 lb 4 oz (95.8 kg)  04/27/17 218 lb (98.9 kg)  04/26/17 218 lb (98.9 kg)       ASSESSMENT AND PLAN:   Coronary artery disease involving native coronary artery of native heart with stable angina pectoris -  previousEchocardiogram with anterior wall hypokinesis, old MI Stress test  showing no ischemia No anginal symptoms at this time, no further workup, stable  Persistent atrial fibrillation ([place]) - Plan: ECHOCARDIOGRAM COMPLETE Tolerating warfarin. Sometimes misses a day or 2  asymptomatic. No further workup needed Previously indicated he did not want NOAC. Prefers generic  Secondary hypertension, unspecified Blood pressure is well controlled on today's visit. No medication changes  Hyperlipidemia Encouraged him to stay on his Lipitor Stressed importance of also staying on his Zetia. Lipids previously managed by primary care, not at goal. Recommended he have repeat lipid panel with [facility] or we can do through our office   S/P CABG x 3 Previous Stress test as above, no ischemia No further workup Active with no anginal symptoms, stable  Shortness of breath On Lasix twice a day, recommended he consider taking this once a day given elevated BUN and creatinine. No signs of fluid retention. Appears euvolemic  Chronic low back pain Seen by Dr. Christy Sartorius Reports back pain is stable   Total encounter time more than 25 minutes  Greater than 50% was spent in counseling and coordination of care with the patient   Disposition:   F/U  12 months   No orders of the defined types were placed in this encounter.    Signed, Esmond Plants, M.D., Ph.D. 09/03/2017  [place], [place]

## 2017-09-03 ENCOUNTER — Encounter: Payer: Self-pay | Admitting: *Deleted

## 2017-09-03 ENCOUNTER — Ambulatory Visit (INDEPENDENT_AMBULATORY_CARE_PROVIDER_SITE_OTHER): Payer: Medicare Other | Admitting: Cardiovascular Disease

## 2017-09-03 ENCOUNTER — Encounter: Payer: Self-pay | Admitting: Cardiovascular Disease

## 2017-09-03 ENCOUNTER — Ambulatory Visit (INDEPENDENT_AMBULATORY_CARE_PROVIDER_SITE_OTHER): Payer: Medicare Other

## 2017-09-03 VITALS — BP 135/86 | HR 86 | Ht 67.0 in | Wt 211.2 lb

## 2017-09-03 DIAGNOSIS — I4891 Unspecified atrial fibrillation: Secondary | ICD-10-CM | POA: Diagnosis not present

## 2017-09-03 DIAGNOSIS — R0602 Shortness of breath: Secondary | ICD-10-CM | POA: Diagnosis not present

## 2017-09-03 DIAGNOSIS — Z7901 Long term (current) use of anticoagulants: Secondary | ICD-10-CM

## 2017-09-03 DIAGNOSIS — Z7189 Other specified counseling: Secondary | ICD-10-CM

## 2017-09-03 DIAGNOSIS — I209 Angina pectoris, unspecified: Secondary | ICD-10-CM | POA: Diagnosis not present

## 2017-09-03 DIAGNOSIS — M3130 Wegener's granulomatosis without renal involvement: Secondary | ICD-10-CM | POA: Diagnosis not present

## 2017-09-03 DIAGNOSIS — E7800 Pure hypercholesterolemia, unspecified: Secondary | ICD-10-CM | POA: Diagnosis not present

## 2017-09-03 DIAGNOSIS — I25118 Atherosclerotic heart disease of native coronary artery with other forms of angina pectoris: Secondary | ICD-10-CM | POA: Diagnosis not present

## 2017-09-03 DIAGNOSIS — I10 Essential (primary) hypertension: Secondary | ICD-10-CM | POA: Diagnosis not present

## 2017-09-03 LAB — POCT INR: INR: 1.2

## 2017-09-03 MED ORDER — EZETIMIBE 10 MG PO TABS: 10.0000 mg | ORAL_TABLET | Freq: Every day | ORAL | 3 refills | Status: DC

## 2017-09-03 MED ORDER — LOSARTAN POTASSIUM 50 MG PO TABS: 25.0000 mg | ORAL_TABLET | Freq: Every day | ORAL | 3 refills | Status: DC

## 2017-09-03 MED ORDER — ATORVASTATIN CALCIUM 40 MG PO TABS: 40.0000 mg | ORAL_TABLET | Freq: Every day | ORAL | 3 refills | Status: DC

## 2017-09-03 MED ORDER — METOPROLOL SUCCINATE ER 25 MG PO TB24: 25.0000 mg | ORAL_TABLET | Freq: Every day | ORAL | 3 refills | Status: DC

## 2017-09-03 NOTE — Patient Instructions (Addendum)
Medication Instructions:   No medication changes made  Labwork:  No new labs needed  Testing/Procedures:  No further testing at this time   Follow-Up: It was a pleasure seeing you in the office today. Please call us if you have new issues that need to be addressed before your next appt.  [phone]  Your physician wants you to follow-up in: 12 months.  You will receive a reminder letter in the mail two months in advance. If you don't receive a letter, please call our office to schedule the follow-up appointment.  If you need a refill on your cardiac medications before your next appointment, please call your pharmacy.     Follow-Up: You have been referred to Mable Paris, NP October 23, 2017 at 09:00AM Please arrive at 08:45AM to register and bring insurance information with you.  Therapist, music at [facility] [address] [place], [place] [address] [place]: [phone]

## 2017-09-05 ENCOUNTER — Ambulatory Visit
Admission: RE | Admit: 2017-09-05 | Discharge: 2017-09-05 | Disposition: A | Discharge status: Home / Self Care | Payer: MEDICARE

## 2017-09-05 ENCOUNTER — Ambulatory Visit
Admission: RE | Admit: 2017-09-05 | Discharge: 2017-09-05 | Disposition: A | Discharge status: Home / Self Care | Payer: MEDICARE | Attending: Nephrology

## 2017-09-05 DIAGNOSIS — R791 Abnormal coagulation profile: Secondary | ICD-10-CM

## 2017-09-05 DIAGNOSIS — N183 Chronic kidney disease, stage 3 (moderate): Secondary | ICD-10-CM

## 2017-09-05 DIAGNOSIS — I776 Arteritis, unspecified: Principal | ICD-10-CM

## 2017-09-05 DIAGNOSIS — I2510 Atherosclerotic heart disease of native coronary artery without angina pectoris: Secondary | ICD-10-CM

## 2017-09-05 DIAGNOSIS — C669 Malignant neoplasm of unspecified ureter: Secondary | ICD-10-CM

## 2017-09-05 DIAGNOSIS — R300 Dysuria: Secondary | ICD-10-CM

## 2017-09-05 DIAGNOSIS — K219 Gastro-esophageal reflux disease without esophagitis: Secondary | ICD-10-CM

## 2017-09-05 DIAGNOSIS — Z23 Encounter for immunization: Secondary | ICD-10-CM

## 2017-09-05 MED ORDER — PANTOPRAZOLE 40 MG TABLET,DELAYED RELEASE
ORAL_TABLET | Freq: Every day | ORAL | 3 refills | 0 days | Status: CP
Start: 2017-09-05 — End: 2018-09-18

## 2017-09-05 MED ORDER — LOSARTAN 50 MG TABLET
ORAL_TABLET | Freq: Every day | ORAL | 4 refills | 0.00000 days | Status: CP
Start: 2017-09-05 — End: 2018-10-30

## 2017-09-05 MED ORDER — FUROSEMIDE 20 MG TABLET
ORAL_TABLET | Freq: Two times a day (BID) | ORAL | 4 refills | 0 days | Status: CP
Start: 2017-09-05 — End: 2018-10-30

## 2017-09-05 NOTE — Addendum Note (Signed)
Addended by: Janan Ridge on: 09/05/2017 12:10 PM   Modules accepted: Orders

## 2017-09-08 MED ORDER — CIPROFLOXACIN 500 MG TABLET
ORAL_TABLET | Freq: Two times a day (BID) | ORAL | 0 refills | 0.00000 days | Status: CP
Start: 2017-09-08 — End: 2018-01-07

## 2017-09-17 ENCOUNTER — Ambulatory Visit (INDEPENDENT_AMBULATORY_CARE_PROVIDER_SITE_OTHER): Payer: Medicare Other

## 2017-09-17 DIAGNOSIS — Z5181 Encounter for therapeutic drug level monitoring: Secondary | ICD-10-CM

## 2017-09-17 DIAGNOSIS — Z7901 Long term (current) use of anticoagulants: Secondary | ICD-10-CM

## 2017-09-17 DIAGNOSIS — I4891 Unspecified atrial fibrillation: Secondary | ICD-10-CM | POA: Diagnosis not present

## 2017-09-17 LAB — POCT INR: INR: 2.2

## 2017-09-25 ENCOUNTER — Other Ambulatory Visit: Payer: Self-pay | Admitting: Ophthalmology

## 2017-09-25 DIAGNOSIS — G453 Amaurosis fugax: Secondary | ICD-10-CM

## 2017-09-25 DIAGNOSIS — H538 Other visual disturbances: Secondary | ICD-10-CM

## 2017-09-25 DIAGNOSIS — H5340 Unspecified visual field defects: Secondary | ICD-10-CM

## 2017-09-27 ENCOUNTER — Ambulatory Visit (INDEPENDENT_AMBULATORY_CARE_PROVIDER_SITE_OTHER): Payer: Medicare Other

## 2017-09-27 ENCOUNTER — Encounter (INDEPENDENT_AMBULATORY_CARE_PROVIDER_SITE_OTHER): Payer: Self-pay | Admitting: Orthopaedic Surgery

## 2017-09-27 ENCOUNTER — Ambulatory Visit
Admission: RE | Admit: 2017-09-27 | Discharge: 2017-09-27 | Disposition: A | Discharge status: Home / Self Care | Payer: Medicare Other | Source: Ambulatory Visit | Attending: Ophthalmology | Admitting: Ophthalmology

## 2017-09-27 ENCOUNTER — Ambulatory Visit (INDEPENDENT_AMBULATORY_CARE_PROVIDER_SITE_OTHER): Payer: Medicare Other | Admitting: Orthopaedic Surgery

## 2017-09-27 VITALS — Resp 14 | Ht 65.0 in | Wt 195.0 lb

## 2017-09-27 DIAGNOSIS — Z96642 Presence of left artificial hip joint: Secondary | ICD-10-CM | POA: Diagnosis not present

## 2017-09-27 MED ORDER — LIDOCAINE HCL 1 % IJ SOLN
5.0000 mL | INTRAMUSCULAR | Status: AC | PRN
  Administered 2017-09-27: 5 mL

## 2017-09-27 MED ORDER — METHYLPREDNISOLONE ACETATE 40 MG/ML IJ SUSP
80.0000 mg | INTRAMUSCULAR | Status: AC | PRN
  Administered 2017-09-27: 80 mg

## 2017-09-27 NOTE — Progress Notes (Signed)
Office Visit Note   Patient: Jesus Hendrix           Date of Birth: 01-Aug-1946           MRN: [id number] Visit Date: 09/27/2017              Requested by: Jesus Simmer, MD [address] [place], [place] [address] PCP: Jesus Simmer, MD   Assessment & Plan: Visit Diagnoses:  1. Status post left hip replacement   Myositis ossificans left hip which may be responsible for present pain. Also having pain over the greater trochanter left hip. Films of hip were negative for any abnormality Plan: Cortisone injection over the area of tenderness greater trochanter left hip and follow-up in the next 3-4 weeks if no better  Follow-Up Instructions: No Follow-up on file.   Orders:  Orders Placed This Encounter  Procedures  . XR Pelvis 1-2 Views   No orders of the defined types were placed in this encounter.     Procedures: Large Joint Inj Date/Time: 09/27/2017 10:43 AM Performed by: Garald Balding Authorized by: Garald Balding   Consent Given by:  Patient Timeout: prior to procedure the correct patient, procedure, and site was verified   Indications:  Pain Location:  Hip Site:  L greater trochanter Prep: patient was prepped and draped in usual sterile fashion   Needle Size:  22 G Needle Length:  3.5 inches Approach:  Lateral Ultrasound Guidance: No   Fluoroscopic Guidance: No   Arthrogram: No   Medications:  5 mL lidocaine 1 %; 80 mg methylPREDNISolone acetate 40 MG/ML Aspiration Attempted: No   Patient tolerance:  Patient tolerated the procedure well with no immediate complications     Clinical Data: No additional findings.   Subjective: Chief Complaint  Patient presents with  . Left Hip - Pain, Routine Post Op    Jesus Hendrix is a 71 y o S/P 1 yr L THA. He relates his L hip, Back and groin pain. He is getting surgery for a bone spur removal this month by Jesus Hendrix. He get multiple pain meds from all his doctors, but doesn't take all the time.    Uncomplicated left total hip replacement in January 2018. He had been doing well up until several months ago when he began to experience some pain along the lateral aspect of his left hip. Denies history of injury or trauma. No groin or thigh pain except occasionally "at night". Had some had long-standing issues with his lumbar spine being followed by Dr. Glenna Hendrix. No history of fever or chills. No history of injury or trauma  HPI  Review of Systems  Constitutional: Negative for fatigue.  HENT: Positive for hearing loss.   Respiratory: Negative for apnea, chest tightness and shortness of breath.   Cardiovascular: Negative for chest pain, palpitations and leg swelling.  Gastrointestinal: Negative for blood in stool, constipation and diarrhea.  Genitourinary: Negative for difficulty urinating.  Musculoskeletal: Positive for back pain. Negative for arthralgias, joint swelling, myalgias, neck pain and neck stiffness.  Neurological: Negative for weakness, numbness and headaches.  Hematological: Does not bruise/bleed easily.  Psychiatric/Behavioral: Negative for sleep disturbance. The patient is not nervous/anxious.      Objective: Vital Signs: Resp 14   Ht 5\' 5"  (1.651 m)   Wt 195 lb (88.5 kg)   BMI 32.45 kg/m   Physical Exam  Ortho Exam awake alert and oriented 3. Comfortable sitting position. Does have a marked limp localizing  pain over 1 area in the inferior aspect of the posterior hip incision. No erythema or ecchymosis. No pain with range of motion of left hip in the sitting position. No swelling distally. Neurovascular exam intact. Straight leg raise negative. No buttock pain. No pain over the sacroiliac joints. No thigh erythema or induration.  Jesus Hendrix believes that he is limping more because of his back than from his hip No specialty comments available.  Imaging: No results found.   PMFS History: Patient Active Problem List   Diagnosis Date Noted  . Lumbar radiculopathy  05/02/2017  . Spondylolisthesis of lumbar region 05/02/2017  . Spinal stenosis of lumbar region with neurogenic claudication 05/02/2017  . Encounter for anticoagulation discussion and counseling 04/08/2017  . Avascular necrosis of bone of hip, left ([place]) 12/19/2016  . Status post total replacement of left hip 12/19/2016  . Long term (current) use of anticoagulants [Z79.01] 08/09/2016  . New onset a-fib ([place]) 06/06/2016  . Anxiety 06/06/2016  . Hip pain 06/06/2016  . Shortness of breath   . Preop cardiovascular exam 11/15/2015  . Special screening for malignant neoplasms, colon   . Benign neoplasm of sigmoid colon   . Rectal polyp   . Mild aortic valve stenosis 07/13/2014  . Carotid stenosis 07/13/2014  . Near syncope 04/15/2014  . Murmur 04/15/2014  . Bruit 04/15/2014  . Bradycardia 04/15/2014  . LBBB (left bundle branch block) 04/15/2014  . First degree AV block 04/15/2014  . Wegener's granulomatosis ([place]) 05/23/2013  . CAD (coronary artery disease) 01/08/2012  . 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 Hyperlipidemia 01/08/2012  . HTN (hypertension) 01/08/2012   Past Medical History:  Diagnosis Date  . Anxiety   . Aortic stenosis    a. mild on echo 04/2014  . Atrial fibrillation ([place]) 06/06/2016   a. newly diagnosed 06/06/2016; b. CHADS2VASc => 5 (CHF, HTN, age x 1, DM, vascular disease)  . Choledocholithiasis   . Chronic kidney disease   . Chronic systolic CHF (congestive heart failure) ([place])    a. echo 04/2014: EF 45-50%, nl LV dia fxn, mild AS, LA mildly dilated, PASP nl  . COPD (chronic obstructive pulmonary disease) ([place])    Not on home oxygen  . Coronary artery disease    a. status post CABG in 2007; b. nuc stress test 2014: small region of mild ischemia in the inferior territory, EF 61%, no ischemia, scan similar to prior scan in 2009  . Exogenous obesity   . GERD (gastroesophageal reflux disease)   . Gout   . Hiatal hernia   . Hip pain, chronic 08/03/2016   Waiting to have  hip replacement pending NM stress test results per patient.  . History of colonic diverticulitis   . History of renal cell carcinoma    a. s/p laser ablation   . Hyperlipidemia   . Hypertension   . Near syncope    a. 3 separate events in 2017  . Nephrolithiasis   . Panic attacks   . Spinal stenosis   . Temporomandibular joint pain dysfunction syndrome   . Umbilical hernia   . Vitamin D deficiency   . Wegener's granulomatosis with renal involvement ([place])     Family History  Problem Relation Age of Onset  . Heart disease Mother   . Heart disease Sister   . Heart disease Brother     Past Surgical History:  Procedure Laterality Date  . CARDIAC CATHETERIZATION    . CHOLECYSTECTOMY    . COLONOSCOPY  N/A 06/03/2015   Procedure: COLONOSCOPY;  Surgeon: Lucilla Lame, MD;  Location: [facility];  Service: Gastroenterology;  Laterality: N/A;  . CORONARY ARTERY BYPASS GRAFT  2004   CABG x 3  . EYE SURGERY    . HERNIA REPAIR     x 2  . HIP SURGERY     right hip replacement  . INTRAOCULAR LENS INSERTION    . KIDNEY SURGERY     cancer  . NOSE SURGERY    . POLYPECTOMY  06/03/2015   Procedure: POLYPECTOMY;  Surgeon: Lucilla Lame, MD;  Location: [facility];  Service: Gastroenterology;;  . PROSTATE SURGERY    . TOTAL HIP ARTHROPLASTY Left 12/19/2016   Procedure: LEFT TOTAL HIP ARTHROPLASTY;  Surgeon: Garald Balding, MD;  Location: [facility];  Service: Orthopedics;  Laterality: Left;   Social History   Occupational History  . Not on file.   Social History Main Topics  . Smoking status: Former Smoker    Packs/day: 0.50    Years: 25.00    Types: Cigarettes    Quit date: 12/29/1974  . Smokeless tobacco: Never Used  . Alcohol use No     Comment: social  . Drug use: No  . Sexual activity: Not on file

## 2017-09-28 ENCOUNTER — Ambulatory Visit: Payer: Medicare Other | Attending: Ophthalmology

## 2017-10-05 ENCOUNTER — Ambulatory Visit
Admission: RE | Admit: 2017-10-05 | Discharge: 2017-10-05 | Payer: MEDICARE | Attending: Neurological Surgery | Admitting: Neurological Surgery

## 2017-10-05 DIAGNOSIS — M47816 Spondylosis without myelopathy or radiculopathy, lumbar region: Principal | ICD-10-CM

## 2017-10-05 MED ORDER — CAPSAICIN 0.075 % TOPICAL CREAM
Freq: Three times a day (TID) | TOPICAL | 0 refills | 0.00000 days | Status: CP
Start: 2017-10-05 — End: 2018-10-05

## 2017-10-08 ENCOUNTER — Ambulatory Visit: Payer: Medicare Other | Admitting: Cardiovascular Disease

## 2017-10-15 ENCOUNTER — Ambulatory Visit (INDEPENDENT_AMBULATORY_CARE_PROVIDER_SITE_OTHER): Payer: Medicare Other

## 2017-10-15 ENCOUNTER — Telehealth: Payer: Self-pay | Admitting: Cardiovascular Disease

## 2017-10-15 DIAGNOSIS — Z7901 Long term (current) use of anticoagulants: Secondary | ICD-10-CM

## 2017-10-15 DIAGNOSIS — I4891 Unspecified atrial fibrillation: Secondary | ICD-10-CM

## 2017-10-15 LAB — POCT INR: INR: 1.8

## 2017-10-15 NOTE — Telephone Encounter (Signed)
Advised pt that he will need appt w/ Dr. Rockey Situ to discuss switching to NOAC. He was therapeutic today. Pt escorted to front desk to schedule appt, but he stated that he will call back later to schedule this.

## 2017-10-15 NOTE — Telephone Encounter (Signed)
Pt came in today for a coumadin visit  He states he may want to switch over to Eliquis   He states in his right eye he is seeing blurry  He has gone to his Eye doctor, Dr.Dingledine  here in [place]   And they think he may need to switch over for he may have had a stroke  Please advise

## 2017-10-23 ENCOUNTER — Encounter: Payer: Self-pay | Admitting: Family

## 2017-10-23 ENCOUNTER — Ambulatory Visit (INDEPENDENT_AMBULATORY_CARE_PROVIDER_SITE_OTHER): Payer: Medicare Other | Admitting: Family

## 2017-10-23 VITALS — BP 132/62 | HR 76 | Temp 97.9°F | Ht 65.0 in | Wt 216.2 lb

## 2017-10-23 DIAGNOSIS — I4891 Unspecified atrial fibrillation: Secondary | ICD-10-CM

## 2017-10-23 DIAGNOSIS — F419 Anxiety disorder, unspecified: Secondary | ICD-10-CM | POA: Diagnosis not present

## 2017-10-23 DIAGNOSIS — M3130 Wegener's granulomatosis without renal involvement: Secondary | ICD-10-CM | POA: Diagnosis not present

## 2017-10-23 DIAGNOSIS — B0229 Other postherpetic nervous system involvement: Secondary | ICD-10-CM | POA: Diagnosis not present

## 2017-10-23 DIAGNOSIS — C4491 Basal cell carcinoma of skin, unspecified: Secondary | ICD-10-CM | POA: Insufficient documentation

## 2017-10-23 DIAGNOSIS — I25118 Atherosclerotic heart disease of native coronary artery with other forms of angina pectoris: Secondary | ICD-10-CM | POA: Diagnosis not present

## 2017-10-23 DIAGNOSIS — C641 Malignant neoplasm of right kidney, except renal pelvis: Secondary | ICD-10-CM | POA: Insufficient documentation

## 2017-10-23 MED ORDER — DICLOFENAC SODIUM 1 % TD GEL: 4.0000 g | Freq: Four times a day (QID) | TRANSDERMAL | 3 refills | Status: DC

## 2017-10-23 MED ORDER — FLUOXETINE HCL 10 MG PO CAPS: 10.0000 mg | ORAL_CAPSULE | Freq: Every day | ORAL | 3 refills | Status: DC

## 2017-10-23 NOTE — Assessment & Plan Note (Signed)
Clinically asymptomatic today. Follow Dr. Rockey Situ. Currently on warfarin, toprol.

## 2017-10-23 NOTE — Progress Notes (Signed)
Pre visit review using our clinic review tool, if applicable. No additional management support is needed unless otherwise documented below in the visit note. 

## 2017-10-23 NOTE — Assessment & Plan Note (Signed)
Stable. Refilled for prozac provided. Will follow

## 2017-10-23 NOTE — Patient Instructions (Signed)
Trial of voltaren gel for left leg pain. This is a topical ibuprofen  Follow up in 3 months.   Nice meeting you!

## 2017-10-23 NOTE — Assessment & Plan Note (Addendum)
Lesions appear to be scabbing. Benign knee exam . Patient completed course of Valtrex. At this point, working diagnosis of postherpetic neuralgia. Advised topical Voltaren gel due to CKD. Return precautions given.

## 2017-10-23 NOTE — Assessment & Plan Note (Signed)
Follows with urology, nephrology. Will follow

## 2017-10-23 NOTE — Progress Notes (Signed)
Subjective:    [name], male    DOB: Dec 30, 1945, 71 y.o.   MRN: [id number]  CC: Jesus Hendrix is a 71 y.o. male who presents today to establish Hendrix.    HPI:  Overall feels well today.   He has concerns for continued pain in his left leg and left knee which started 4 weeks ago when diagnosed with shingles per patient. Blisters are drying up, scabs. Completed valtrex. Continues to have pain.   Has been in [facility] ED for 'shingles' on left leg 4 weeks; started on valtrex, which has cleared up.  No numbness, tingling, Fever, chills. 'Sensitive to touch'. Notes continued left knee pain which he didn't have prior to shingles. NO knee swelling, injury.  Tried capsaicin without relief. Reviewed note from Jesus Hendrix 10/26; unable to Left knee XR.   Also notes he is out of his Prozac which he uses for anxiety. This medication works well for him.   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  Dr. 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   Afib/ CAD s/p bypass/ LBBB- dr Jesus Hendrix; on warfarin. Denies exertional chest pain or pressure, numbness or tingling radiating to left arm or jaw, palpitations, dizziness, frequent headaches, changes in vision, or shortness of breath.   [facility]- h/o basal cell  HLD- on lipitor. Tolerating well.         HISTORY:  Past Medical History:  Diagnosis Date  . Anxiety   . Aortic stenosis    a. mild on echo 04/2014  . Atrial fibrillation ([place]) 06/06/2016   a. newly diagnosed 06/06/2016; b. CHADS2VASc => 5 (CHF, HTN, age x 1, DM,  vascular disease)  . Choledocholithiasis   . Chronic kidney disease   . Chronic systolic CHF (congestive heart failure) (Jesus Hendrix)    a. echo 04/2014: EF 45-50%, nl LV dia fxn, mild AS, LA mildly dilated, PASP nl  . COPD (chronic obstructive pulmonary disease) (Jesus Hendrix)    Not on home oxygen  . Coronary artery disease    a. status post CABG in 2007; b. nuc stress test 2014: small region of mild ischemia in the inferior territory, EF 61%, no ischemia, scan similar to prior scan in 2009  . Exogenous obesity   . GERD (gastroesophageal reflux disease)   . Gout   . Hiatal hernia   . Hip pain, chronic 08/03/2016   Waiting to have hip replacement pending NM stress test results per patient.  . History of colonic diverticulitis   . History of renal cell carcinoma    a. s/p laser ablation   . Hyperlipidemia   . Hypertension   . Near syncope    a. 3 separate events in 2017  . Nephrolithiasis   . Panic attacks   . Spinal stenosis   . Temporomandibular joint pain dysfunction syndrome   . Umbilical hernia   . Vitamin D deficiency   . Wegener's granulomatosis with renal involvement Jesus Hendrix)    Past Surgical History:  Procedure Laterality Date  . CARDIAC CATHETERIZATION    . CHOLECYSTECTOMY    . CORONARY ARTERY BYPASS GRAFT  2004   CABG x 3  . EYE SURGERY    . HERNIA REPAIR     x 2  . HIP SURGERY     right hip replacement  . INTRAOCULAR LENS INSERTION    . KIDNEY SURGERY     cancer  . NOSE SURGERY    . PROSTATE SURGERY     Family History  Problem Relation Age of Onset  . Heart disease Mother   . Heart disease Sister   . Heart disease Brother     Allergies: Klonopin [clonazepam] Current Outpatient Medications on File Prior to Visit  Medication Sig Dispense Refill  . acetaminophen (TYLENOL) 325 MG tablet Take 650 mg by mouth 3 (three) times daily as needed for moderate pain.    Marland Kitchen atorvastatin (LIPITOR) 40 MG tablet Take 1 tablet (40 mg total) by mouth daily. 90 tablet 3  . Cholecalciferol  (VITAMIN D) 2000 units CAPS Take 2,000 Units by mouth daily.    Marland Kitchen ezetimibe (ZETIA) 10 MG tablet Take 1 tablet (10 mg total) by mouth daily. 90 tablet 3  . fluticasone (FLONASE) 50 MCG/ACT nasal spray Place 2 sprays into both nostrils at bedtime.     . furosemide (LASIX) 20 MG tablet Take 1 tablet (20 mg total) by mouth 2 (two) times daily as needed for fluid or edema. (Patient taking differently: Take 40 mg by mouth daily. ) 60 tablet 6  . gabapentin (NEURONTIN) 100 MG capsule Take 2 capsules (200 mg total) by mouth 2 (two) times daily at 8 am and 10 pm. 60 capsule 1  . losartan (COZAAR) 50 MG tablet Take 0.5 tablets (25 mg total) by mouth at bedtime. 90 tablet 3  . metoprolol succinate (TOPROL-XL) 25 MG 24 hr tablet Take 1 tablet (25 mg total) by mouth daily. 90 tablet 3  . niacin (NIASPAN) 500 MG CR tablet Take 500 mg by mouth daily.    . pantoprazole (PROTONIX) 40 MG tablet Take 40 mg by mouth daily.    . tamsulosin (FLOMAX) 0.4 MG CAPS capsule Take 0.4 mg by mouth at bedtime.     Marland Kitchen warfarin (COUMADIN) 5 MG tablet Take as directed by [facility] 120 tablet 1   No current facility-administered medications on file prior to visit.     Social History   Tobacco Use  . Smoking status: Former Smoker    Packs/day: 0.50    Years: 25.00    Pack years: 12.50    Types: Cigarettes    Last attempt to quit: 12/29/1974    Years since quitting: 42.8  . Smokeless tobacco: Never Used  Substance Use Topics  . Alcohol use: No    Alcohol/week: 1.2 oz    Types: 2 Cans of beer per week    Comment: social  . Drug use: No    Review of Systems  Constitutional: Negative for chills and fever.  Respiratory: Negative for cough.   Cardiovascular: Negative for chest pain and palpitations.  Gastrointestinal: Negative for nausea and vomiting.  Musculoskeletal: Negative for joint swelling.  Skin: Negative for rash (resolving).      Objective:    BP 132/62   Pulse 76   Temp 97.9 F (36.6 C) (Oral)    Ht 5\' 5"  (1.651 m)   Wt 216 lb 3.2 oz (98.1 kg)   SpO2 97%   BMI 35.98 kg/m  BP Readings from Last 3 Encounters:  10/23/17 132/62  09/03/17 135/86  07/11/17 121/77   Wt Readings from Last 3  Encounters:  10/23/17 216 lb 3.2 oz (98.1 kg)  09/27/17 195 lb (88.5 kg)  09/03/17 211 lb 4 oz (95.8 kg)    Physical Exam  Constitutional: He appears well-developed and well-nourished.  Cardiovascular: Regular rhythm and normal heart sounds.  Pulmonary/Chest: Effort normal and breath sounds normal. No respiratory distress. He has no wheezes. He has no rhonchi. He has no rales.  Musculoskeletal:       Right knee: He exhibits normal range of motion, no swelling, no effusion, no ecchymosis, no deformity, no laceration and no erythema. No tenderness found.       Left knee: He exhibits normal range of motion, no swelling, no effusion, no ecchymosis, no deformity, no laceration and no erythema. No tenderness found.  Bilateral knees are symmetric. No effusion appreciated. No increase in warmth or erythema. Crepitus felt with flexion of bilateral knees.  Left knee:  Able to extend to -5 to 10 degrees and flex to 110 degrees. No catching with McMurray maneuver. No patellar apprehension. Negative anterior drawer and lachman's- no laxity appreciated.  No calf tenderness of lower leg edema bilaterally.    Neurological: He is alert.  Skin: Skin is warm and dry.     Scabs noted medial left thigh. No vesicular lesions appreciated. No erythema, increased warmth, or purulent discharge noted  Psychiatric: He has a normal mood and affect. His speech is normal and behavior is normal.  Vitals reviewed.      Assessment & Plan:   Problem List Items Addressed This Visit      Cardiovascular and Mediastinum   Wegener's granulomatosis ([place])    Follows with urology, nephrology. Will follow      Jesus onset a-fib ([place])    Clinically asymptomatic today. Follow Dr. [name]. Currently on warfarin, toprol.           Nervous and Auditory   Post herpetic neuralgia - Primary    Lesions appear to be scabbing. Benign knee exam . Patient completed course of Valtrex. At this point, working diagnosis of postherpetic neuralgia. Advised topical Voltaren gel due to CKD. Return precautions given.      Relevant Medications   diclofenac sodium (VOLTAREN) 1 % GEL   FLUoxetine (PROZAC) 10 MG capsule     Genitourinary   Renal cancer, right (HCC)     Other   Anxiety    Stable. Refilled for prozac provided. Will follow       Relevant Medications   FLUoxetine (PROZAC) 10 MG capsule       I am having Carlyon Shadow maintain his pantoprazole, fluticasone, tamsulosin, niacin, acetaminophen, furosemide, Vitamin D, warfarin, gabapentin, metoprolol succinate, losartan, ezetimibe, atorvastatin, diclofenac sodium, and FLUoxetine.   Meds ordered this encounter  Medications  . DISCONTD: FLUoxetine (PROZAC) 10 MG capsule    Sig: Take 1 capsule (10 mg total) daily by mouth.    Dispense:  90 capsule    Refill:  3    Order Specific Question:   Supervising Provider    Answer:   Deborra Medina L [[id number]]  . DISCONTD: diclofenac sodium (VOLTAREN) 1 % GEL    Sig: Apply 4 g 4 (four) times daily topically.    Dispense:  1 Tube    Refill:  3    Order Specific Question:   Supervising Provider    Answer:   Derrel Nip, TERESA L [[id number]]  . diclofenac sodium (VOLTAREN) 1 % GEL    Sig: Apply 4 g 4 (four) times daily topically.    Dispense:  1 Tube    Refill:  3  . FLUoxetine (PROZAC) 10 MG capsule    Sig: Take 1 capsule (10 mg total) daily by mouth.    Dispense:  90 capsule    Refill:  3    Return precautions given.   Risks, benefits, and alternatives of the medications and treatment plan prescribed today were discussed, and patient expressed understanding.   Education regarding symptom management and diagnosis given to patient on AVS.  Continue to follow with Burnard Hawthorne, FNP for routine health maintenance.    Carlyon Shadow and I agreed with plan.   Mable Paris, FNP

## 2017-10-31 ENCOUNTER — Ambulatory Visit (INDEPENDENT_AMBULATORY_CARE_PROVIDER_SITE_OTHER): Payer: Medicare Other

## 2017-10-31 DIAGNOSIS — Z7901 Long term (current) use of anticoagulants: Secondary | ICD-10-CM | POA: Diagnosis not present

## 2017-10-31 DIAGNOSIS — I4891 Unspecified atrial fibrillation: Secondary | ICD-10-CM

## 2017-10-31 LAB — POCT INR: INR: 1.5

## 2017-11-01 ENCOUNTER — Encounter: Payer: Self-pay | Admitting: *Deleted

## 2017-11-01 NOTE — Progress Notes (Signed)
Patient came in today with previous letter that has date of 09/21/16 which was error. Letter was actually done on 09/21/17. New letter created with updated date and once signed by MD will place up front for patient to pick up later today.

## 2017-11-12 ENCOUNTER — Ambulatory Visit (INDEPENDENT_AMBULATORY_CARE_PROVIDER_SITE_OTHER): Payer: Medicare Other

## 2017-11-12 DIAGNOSIS — Z7901 Long term (current) use of anticoagulants: Secondary | ICD-10-CM

## 2017-11-12 DIAGNOSIS — Z5181 Encounter for therapeutic drug level monitoring: Secondary | ICD-10-CM | POA: Diagnosis not present

## 2017-11-12 DIAGNOSIS — I4891 Unspecified atrial fibrillation: Secondary | ICD-10-CM | POA: Diagnosis not present

## 2017-11-12 LAB — POCT INR: INR: 3.5

## 2017-11-12 NOTE — Patient Instructions (Signed)
Please skip today's pill ,then resume dosage of 1 tablet daily except 1.5 tablets on Sundays and Wednesdays, and Fridays. Recheck in 2 weeks.

## 2017-11-14 ENCOUNTER — Telehealth: Payer: Self-pay | Admitting: Cardiovascular Disease

## 2017-11-19 ENCOUNTER — Ambulatory Visit (INDEPENDENT_AMBULATORY_CARE_PROVIDER_SITE_OTHER): Payer: Medicare Other | Admitting: Orthopaedic Surgery

## 2017-12-03 ENCOUNTER — Telehealth: Payer: Self-pay | Admitting: Pharmacist

## 2017-12-03 ENCOUNTER — Ambulatory Visit (INDEPENDENT_AMBULATORY_CARE_PROVIDER_SITE_OTHER): Payer: Medicare Other

## 2017-12-03 DIAGNOSIS — Z7901 Long term (current) use of anticoagulants: Secondary | ICD-10-CM

## 2017-12-03 DIAGNOSIS — I4891 Unspecified atrial fibrillation: Secondary | ICD-10-CM | POA: Diagnosis not present

## 2017-12-03 LAB — POCT INR: INR: 2

## 2017-12-03 NOTE — Telephone Encounter (Signed)
Patient presents for INR check today and advises that he will have procedure on 12/24/16 with [facility] Dr. Carloyn Manner and will need to hold his coumadin for this procedure.    Patient with diagnosis of Afib on warfarin for anticoagulation.    Procedure: spinal resection Date of procedure: Jan 7th, 2019  CHADS2-VASc score of  3 (CHF, HTN, AGE, DM2, stroke/tia x 2, CAD, AGE, male)   Per office protocol, patient can hold warfarin for 5 days prior to procedure.   Patient will not need bridging with Lovenox (enoxaparin) around procedure.  If not bridging, patient should restart warfarin on the evening of procedure or day after, at discretion of procedure MD.    Will route to Dr. Carloyn Manner as Juluis Rainier

## 2017-12-03 NOTE — Patient Instructions (Addendum)
Please take extra 1/2 pill today, then resume previous dosage of 1 tablet daily except 1.5 tablets on Sundays and Wednesdays, and Fridays. Recheck in 2 weeks.  In preparation for surgery on 1/7, please come in on 12/31. If approved by Dr. Rockey Situ, you may hold coumadin x 5 days prior to procedure and resume afterwards - I will give you instructions at your visit on 12/31.

## 2017-12-05 ENCOUNTER — Ambulatory Visit
Admission: RE | Admit: 2017-12-05 | Discharge: 2017-12-05 | Payer: MEDICARE | Attending: Neurological Surgery | Admitting: Neurological Surgery

## 2017-12-05 DIAGNOSIS — B0229 Other postherpetic nervous system involvement: Principal | ICD-10-CM

## 2017-12-05 MED ORDER — GABAPENTIN 300 MG CAPSULE
ORAL_CAPSULE | Freq: Three times a day (TID) | ORAL | 3 refills | 0 days | Status: CP
Start: 2017-12-05 — End: 2018-03-20

## 2017-12-17 ENCOUNTER — Ambulatory Visit (INDEPENDENT_AMBULATORY_CARE_PROVIDER_SITE_OTHER): Payer: Medicare Other

## 2017-12-17 DIAGNOSIS — Z7901 Long term (current) use of anticoagulants: Secondary | ICD-10-CM | POA: Diagnosis not present

## 2017-12-17 DIAGNOSIS — I4891 Unspecified atrial fibrillation: Secondary | ICD-10-CM

## 2017-12-17 LAB — POCT INR: INR: 2.2

## 2017-12-17 MED ORDER — WARFARIN SODIUM 5 MG PO TABS: ORAL_TABLET | ORAL | 0 refills | Status: DC

## 2017-12-17 NOTE — Patient Instructions (Addendum)
Please continue same dosage of 1 tablet daily except 1.5 tablets on Sundays and Wednesdays, and Fridays. Recheck in 4 weeks. You may hold coumadin x 5 days prior to surgery.   Please call [phone] and let me know when you have a date scheduled for your back surgery.

## 2018-01-02 MED ORDER — TAMSULOSIN 0.4 MG CAPSULE: 0 mg | capsule | Freq: Every day | 0 refills | 0 days | Status: AC

## 2018-01-02 MED ORDER — TAMSULOSIN 0.4 MG CAPSULE
ORAL_CAPSULE | Freq: Every day | ORAL | 0 refills | 0.00000 days | Status: CP
Start: 2018-01-02 — End: 2018-01-02

## 2018-01-04 ENCOUNTER — Ambulatory Visit: Payer: Self-pay | Admitting: *Deleted

## 2018-01-04 ENCOUNTER — Telehealth: Payer: Self-pay | Admitting: Family

## 2018-01-04 ENCOUNTER — Other Ambulatory Visit: Payer: Medicare Other

## 2018-01-04 DIAGNOSIS — R300 Dysuria: Secondary | ICD-10-CM

## 2018-01-04 MED ORDER — CIPROFLOXACIN HCL 500 MG PO TABS: 500.0000 mg | ORAL_TABLET | Freq: Two times a day (BID) | ORAL | 0 refills | Status: DC

## 2018-01-04 NOTE — Telephone Encounter (Signed)
Patient stated he will drop off urine today before picking up medication.

## 2018-01-04 NOTE — Telephone Encounter (Signed)
Pt called stating that he self-catheterizes twice daily and burns when he tries to urinate and his urine is cloudy and milky; started about 2 days ago; pt would like medication called in; he can be contacted at [phone]; will initiate nurse triage and will route to [facility].

## 2018-01-04 NOTE — Telephone Encounter (Signed)
I will call in the cipro to [facility] for a 14 day treatment ,  But he needs to drop off a urine sample TODAY prior to starting the antibiotic.  And he needs to take  A probiotic for 4 weeks  To prevent c dif   Taking an antibiotic can create an imbalance in the normal population of bacteria that live in the small intestine.  This imbalance can persist for 3 months.   Taking a probiotic ( Align, Floraque or Culturelle), the generic version of one of these over the counter medications, or an alternative form (kombucha,  Yogurt, or another dietary source) for a minimum of 3 weeks may help prevent a serious antibiotic associated diarrhea  Called clostridium dificile colitis that occurs when the bacteria population is altered .  T

## 2018-01-04 NOTE — Telephone Encounter (Signed)
Please advise 

## 2018-01-04 NOTE — Addendum Note (Signed)
Addended by: Crecencio Mc on: 01/04/2018 12:27 PM   Modules accepted: Orders

## 2018-01-04 NOTE — Telephone Encounter (Signed)
Pt called stating that he self-catheterizes twice daily and burns when he tries to urinate and his urine is cloudy and milky; started about 2 days ago; pt would like medication called in; he can be contacted at [phone]; will initiate nurse triage and will route to [facility]; see phone encounter dated 01/04/18; pt would like cipro called in instead of recommendations per nurse triage; please contact pt at [phone].    Reason for Disposition . All other males with painful urination  Answer Assessment - Initial Assessment Questions 1. SYMPTOM: "What's the main symptom you're concerned about?" (e.g., frequency, incontinence)     Burning when urinating; 2. ONSET: "When did the  ________  start?"     2 days ago 3. PAIN: "Is there any pain?" If so, ask: "How bad is it?" (Scale: 1-10; mild, moderate, severe)     no 4. CAUSE: "What do you think is causing the symptoms?"     Uti; pt self catheterizes every morning and before bed 5. OTHER SYMPTOMS: "Do you have any other symptoms?" (e.g., fever, flank pain, blood in urine, pain with urination)     Cloudy milky urine 6. PREGNANCY: "Is there any chance you are pregnant?" "When was your last menstrual period?"     no  Answer Assessment - Initial Assessment Questions 1. SEVERITY: "How bad is the pain?"  (e.g., Scale 1-10; mild, moderate, or severe)   - MILD (1-3): complains slightly about urination hurting   - MODERATE (4-7): interferes with normal activities     - SEVERE (8-10): excruciating, unwilling or unable to urinate because of the pain      mild 2. FREQUENCY: "How many times have you had painful urination today?"      unknown 3. PATTERN: "Is pain present every time you urinate or just sometimes?"      yes 4. ONSET: "When did the painful urination start?"      2 days ago 5. FEVER: "Do you have a fever?" If so, ask: "What is your temperature, how was it measured, and when did it start?"     no 6. PAST UTI: "Have you had a urine  infection before?" If so, ask: "When was the last time?" and "What happened that time?"      Yes not sure 7. CAUSE: "What do you think is causing the painful urination?"      uti 8. OTHER SYMPTOMS: "Do you have any other symptoms?" (e.g., flank pain, penile discharge, scrotal pain, blood in urine)     Cloudy milky urine  Protocols used: URINATION PAIN - MALE-A-AH, URINARY SYMPTOMS-A-AH

## 2018-01-05 LAB — URINALYSIS, ROUTINE W REFLEX MICROSCOPIC
BACTERIA UA: NONE SEEN /HPF
Bilirubin Urine: NEGATIVE
Glucose, UA: NEGATIVE
Hyaline Cast: NONE SEEN /LPF
KETONES UR: NEGATIVE
Nitrite: NEGATIVE
Protein, ur: NEGATIVE
RBC / HPF: NONE SEEN /HPF (ref 0–2)
SPECIFIC GRAVITY, URINE: 1.008 (ref 1.001–1.03)
Squamous Epithelial / LPF: NONE SEEN /HPF (ref <=5)
WBC, UA: >=60 /HPF — AB (ref 0–5)
pH: 5.5 (ref 5.0–8.0)

## 2018-01-05 LAB — URINE CULTURE
MICRO NUMBER: 90078563
SPECIMEN QUALITY: ADEQUATE

## 2018-01-07 ENCOUNTER — Other Ambulatory Visit: Payer: Self-pay

## 2018-01-07 MED ORDER — CIPROFLOXACIN HCL 500 MG PO TABS: 500.0000 mg | ORAL_TABLET | Freq: Two times a day (BID) | ORAL | 0 refills | Status: DC

## 2018-01-07 MED ORDER — CIPROFLOXACIN 500 MG TABLET
ORAL_TABLET | Freq: Two times a day (BID) | ORAL | 0 refills | 0 days | Status: CP
Start: 2018-01-07 — End: 2019-03-26

## 2018-01-08 ENCOUNTER — Other Ambulatory Visit: Payer: Self-pay

## 2018-01-08 ENCOUNTER — Telehealth: Payer: Self-pay | Admitting: Family

## 2018-01-08 MED ORDER — CIPROFLOXACIN HCL 500 MG PO TABS: 500.0000 mg | ORAL_TABLET | Freq: Two times a day (BID) | ORAL | 0 refills | Status: DC

## 2018-01-08 NOTE — Telephone Encounter (Signed)
FYI incase you get result note.

## 2018-01-08 NOTE — Telephone Encounter (Signed)
Copied from [place] [id number]. Topic: Quick Communication - See Telephone Encounter >> Jan 08, 2018  9:32 AM Arletha Grippe wrote: CRM for notification. See Telephone encounter for:   01/08/18. Wife called - [facility] never received the medicine that was called in, she would like to know why.  Also, pt called his nephrologist and she called in medication, so the request is no longer needed.  Dr Caren Griffins Denu-Ciocca would like to have the urine culture faxed to her when the results are available.  The fax number is [phone].  Please call wife back at [phone]

## 2018-01-09 ENCOUNTER — Ambulatory Visit: Admit: 2018-01-09 | Discharge: 2018-01-09 | Payer: MEDICARE

## 2018-01-09 DIAGNOSIS — R300 Dysuria: Secondary | ICD-10-CM

## 2018-01-09 DIAGNOSIS — I776 Arteritis, unspecified: Principal | ICD-10-CM

## 2018-01-09 DIAGNOSIS — I482 Chronic atrial fibrillation, unspecified: Secondary | ICD-10-CM

## 2018-01-09 NOTE — Telephone Encounter (Signed)
Spoke with pt's wife on [place] and apologized for the medication not making it to the pharmacy but explained to her that it was sent to on 01/04/2018 to [place] on [address] and sent again on 1/21/2019n to [place] on [address]. Told the wife that I would personally call the pharmacy and give a verbal for the rx and that we would fax over the lab results to the nephrologist. Labs results were given to wife at that time.Pt's wife was very upset and stated that I just needed to get it figured out because the pt has been dealing with the symptoms and feeling bad since Thursday.

## 2018-01-14 ENCOUNTER — Ambulatory Visit (INDEPENDENT_AMBULATORY_CARE_PROVIDER_SITE_OTHER): Payer: Medicare Other

## 2018-01-14 DIAGNOSIS — I4891 Unspecified atrial fibrillation: Secondary | ICD-10-CM

## 2018-01-14 DIAGNOSIS — Z7901 Long term (current) use of anticoagulants: Secondary | ICD-10-CM

## 2018-01-14 LAB — POCT INR: INR: 1.7

## 2018-01-14 NOTE — Patient Instructions (Signed)
Please take 2 tablets today, then continue same dosage of 1 tablet daily except 1.5 tablets on Sundays and Wednesdays, and Fridays. Recheck in 3 weeks. You may hold coumadin x 5 days prior to surgery.   Please call [phone] and let me know when you have a date scheduled for your back surgery.

## 2018-01-24 MED ORDER — TAMSULOSIN 0.4 MG CAPSULE
ORAL_CAPSULE | Freq: Every day | ORAL | 0 refills | 0.00000 days | Status: CP
Start: 2018-01-24 — End: 2018-05-28

## 2018-02-01 NOTE — Telephone Encounter (Signed)
error 

## 2018-02-13 ENCOUNTER — Ambulatory Visit (INDEPENDENT_AMBULATORY_CARE_PROVIDER_SITE_OTHER): Payer: Medicare Other

## 2018-02-13 ENCOUNTER — Telehealth: Payer: Self-pay | Admitting: Cardiovascular Disease

## 2018-02-13 DIAGNOSIS — Z7901 Long term (current) use of anticoagulants: Secondary | ICD-10-CM

## 2018-02-13 DIAGNOSIS — Z5181 Encounter for therapeutic drug level monitoring: Secondary | ICD-10-CM

## 2018-02-13 DIAGNOSIS — I4891 Unspecified atrial fibrillation: Secondary | ICD-10-CM | POA: Diagnosis not present

## 2018-02-13 LAB — POCT INR: INR: 1.9

## 2018-02-13 NOTE — Patient Instructions (Signed)
Please continue same dosage of 1 tablet daily except 1.5 tablets on Sundays and Wednesdays, and Fridays. Since you are having surgery tomorrow, please take 1.5 tablets on Saturday. Recheck in 3 weeks.

## 2018-02-13 NOTE — Telephone Encounter (Signed)
Per Dr. Jonathon Jordan office at [place] @ [facility], pt's INR will need to be < 3.5 to proceed w/ surgery tomorrow. Advised his nurse that pt will be here before 5:00 today for INR check, as he missed appt on 2/11. She requests that I fax today's reading to [place] @ [phone].

## 2018-02-13 NOTE — Telephone Encounter (Signed)
Pt's INR today is 1.9. Please see anticoag note for dosing instructions following procedure. Looks like pt was previously cleared, so I am closing this encounter.

## 2018-02-13 NOTE — Telephone Encounter (Signed)
Pt calling stating he is to have surgery on ear, it is a cancer removal by Dr Lacinda Axon at [facility]  He states he is scheduled to removal for the morning  He would need clearance for this procedure   Please advise.

## 2018-02-18 ENCOUNTER — Ambulatory Visit: Payer: Medicare Other | Admitting: Family

## 2018-03-20 ENCOUNTER — Ambulatory Visit: Admit: 2018-03-20 | Discharge: 2018-03-20 | Payer: MEDICARE

## 2018-03-20 DIAGNOSIS — I10 Essential (primary) hypertension: Principal | ICD-10-CM

## 2018-03-20 DIAGNOSIS — I776 Arteritis, unspecified: Secondary | ICD-10-CM

## 2018-03-20 DIAGNOSIS — R8281 Pyuria: Secondary | ICD-10-CM

## 2018-03-20 DIAGNOSIS — N183 Chronic kidney disease, stage 3 (moderate): Secondary | ICD-10-CM

## 2018-03-20 MED ORDER — GABAPENTIN 100 MG CAPSULE
ORAL_CAPSULE | Freq: Every evening | ORAL | 3 refills | 0 days | Status: CP
Start: 2018-03-20 — End: 2018-10-30

## 2018-04-01 ENCOUNTER — Ambulatory Visit (INDEPENDENT_AMBULATORY_CARE_PROVIDER_SITE_OTHER): Payer: Medicare Other

## 2018-04-01 DIAGNOSIS — Z7901 Long term (current) use of anticoagulants: Secondary | ICD-10-CM | POA: Diagnosis not present

## 2018-04-01 DIAGNOSIS — I4891 Unspecified atrial fibrillation: Secondary | ICD-10-CM

## 2018-04-01 LAB — POCT INR: INR: 2

## 2018-04-01 NOTE — Patient Instructions (Signed)
Please take 1.5 tablets today, then continue same dosage of 1 tablet daily except 1.5 tablets on Sundays and Wednesdays, and Fridays. Recheck in 4 weeks.

## 2018-04-02 ENCOUNTER — Telehealth: Payer: Self-pay | Admitting: Cardiovascular Disease

## 2018-04-02 NOTE — Telephone Encounter (Signed)
Pt calling stating the past two days he's been having dizzy spells Along with weakness all over he states  He's not sure if this is his medication but is concerned   Would like advise on this  Please call back

## 2018-04-02 NOTE — Telephone Encounter (Signed)
Pt calling stating he is now not able to even walk 25 ft without getting dizzy  Would like advise on this Please call back

## 2018-04-02 NOTE — Telephone Encounter (Signed)
Called patient. States about a week ago he started getting more shortness of breath with exertion with walking 25-30 feet as well as feeling off balance. He's never felt that way before. He feels dizziness at times when up and moving around. He also gets a sensation coming to the top of his head. He could not really explain what happens but that he feels like he "loses touch" for a few seconds. He states it happens when he "gets excited about something like when trying to tell someone something." Denies numbness or tingling. He does not have a BP cuff so he does not know what his BP is running.   Discussed with Dr Rockey Situ. Advised for me to send him the note for him to take a look at the encounter and advise from here. Patient aware and agreeable. Pt verbalized understanding to call 911 or go to the emergency room, if he develops any new or worsening symptoms.

## 2018-04-03 NOTE — Telephone Encounter (Signed)
If unable to provide [place] with blood pressure, heart rate, can we book him for appt Thursday 3:20pm

## 2018-04-03 NOTE — Telephone Encounter (Signed)
Patient agreeable to appointment with Dr Rockey Situ tomorrow, 04/04/18 at 3:20pm. He verbalized understanding to go to the ER if shortness of breath worsens.

## 2018-04-03 NOTE — Progress Notes (Signed)
[name], male   DOB: 1945/12/30, 72 y.o.   MRN: [id number] Cardiology Office Note  Date:  04/04/2018   ID:  Jesus Hendrix, DOB March 19, 1946, MRN [id number]  PCP:  Burnard Hawthorne, FNP   Chief Complaint  Patient presents with  . other    Pt. c/o increased shortness of breath and chest pain. Meds reviewed by the pt. verbally.     HPI:  Jesus Hendrix is a very pleasant 72 year old gentleman with  Persistent atrial fibrillation chronic back pain and sciatica down his left leg,  coronary artery disease and bypass surgery in 2007,  Wegener's granulomatosis,  hyperlipidemia,  obesity,  kidney cancer,  status post laser ablation at [facility],  previous history of dizziness which he attributes to his Wegener's who presents for routine followup of his coronary artery disease, atrial fibrillation  Reports that he was doing well until the past 3 days  reports having increasing shortness of breath, fatigue, general malaise  New symptoms for him, feels there is a change or problem Denies any PND orthopnea, only seems to have symptoms when walking Sitting, feels fine Denies any orthostasis symptoms No recent illnesses, no chest pain on exertion  Chronic severe back pain, walking with a limp Followed by  Dr. 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  Echocardiogram May 2015 with mild aortic valve stenosis, results were discussed with him  cardiac catheterization in January 2007 details 75-80% long lesion in the LAD at the ostium, 95% lesion in the PDA. He was referred to bypass surgery. 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 Defect worse in the inferoapical region. No ischemia. Ejection fraction 55%. Exercised for 7 METS  Previous history of myalgias on high-dose cholesterol medication. Tolerating Lipitor 30 mg daily  PMH:   has a past medical history of Anxiety, Aortic stenosis, Atrial fibrillation ([place]) (06/06/2016), Choledocholithiasis, Chronic kidney disease, Chronic systolic CHF (congestive heart failure) ([place]), COPD (chronic obstructive pulmonary disease) ([place]), Coronary artery disease, Exogenous obesity, GERD (gastroesophageal reflux disease), Gout, Hiatal hernia, Hip pain, chronic (08/03/2016), History of colonic diverticulitis, History of renal cell carcinoma, Hyperlipidemia, Hypertension, Near syncope, Nephrolithiasis, Panic attacks, Spinal stenosis, Temporomandibular joint pain dysfunction syndrome, Umbilical hernia, Vitamin D deficiency, and Wegener's granulomatosis with renal involvement ([place]).  PSH:    Past Surgical History:  Procedure Laterality Date  . CARDIAC CATHETERIZATION    . CHOLECYSTECTOMY    . COLONOSCOPY N/A 06/03/2015   Procedure: COLONOSCOPY;  Surgeon: Lucilla Lame, MD;  Location: [place];  Service: Gastroenterology;  Laterality: N/A;  . CORONARY ARTERY BYPASS GRAFT  2004   CABG x 3  .  EYE SURGERY    . HERNIA REPAIR     x 2  . HIP SURGERY     right hip replacement  . INTRAOCULAR LENS INSERTION    . KIDNEY SURGERY      cancer  . NOSE SURGERY    . POLYPECTOMY  06/03/2015   Procedure: POLYPECTOMY;  Surgeon: Lucilla Lame, MD;  Location: [place];  Service: Gastroenterology;;  . PROSTATE SURGERY    . TOTAL HIP ARTHROPLASTY Left 12/19/2016   Procedure: LEFT TOTAL HIP ARTHROPLASTY;  Surgeon: Garald Balding, MD;  Location: [place];  Service: Orthopedics;  Laterality: Left;    Current Outpatient Medications  Medication Sig Dispense Refill  . acetaminophen (TYLENOL) 325 MG tablet Take 650 mg by mouth 3 (three) times daily as needed for moderate pain.    Marland Kitchen atorvastatin (LIPITOR) 40 MG tablet Take 1 tablet (40 mg total) by mouth daily. 90 tablet 3  . Cholecalciferol (VITAMIN D) 2000 units CAPS Take 2,000 Units by mouth daily.    . diclofenac sodium (VOLTAREN) 1 % GEL Apply 4 g 4 (four) times daily topically. 1 Tube 3  . ezetimibe (ZETIA) 10 MG tablet Take 1 tablet (10 mg total) by mouth daily. 90 tablet 3  . FLUoxetine (PROZAC) 10 MG capsule Take 1 capsule (10 mg total) daily by mouth. 90 capsule 3  . fluticasone (FLONASE) 50 MCG/ACT nasal spray Place 2 sprays into both nostrils at bedtime.     . furosemide (LASIX) 20 MG tablet Take 1 tablet (20 mg total) by mouth 2 (two) times daily as needed for fluid or edema. (Patient taking differently: Take 40 mg by mouth daily. ) 60 tablet 6  . gabapentin (NEURONTIN) 100 MG capsule Take 2 capsules (200 mg total) by mouth 2 (two) times daily at 8 am and 10 pm. 60 capsule 1  . losartan (COZAAR) 50 MG tablet Take 0.5 tablets (25 mg total) by mouth at bedtime. 90 tablet 3  . metoprolol succinate (TOPROL-XL) 25 MG 24 hr tablet Take 1 tablet (25 mg total) by mouth daily. 90 tablet 3  . niacin (NIASPAN) 500 MG CR tablet Take 500 mg by mouth daily.    . pantoprazole (PROTONIX) 40 MG tablet Take 40 mg by mouth daily.    . tamsulosin (FLOMAX) 0.4 MG CAPS capsule Take 0.4 mg by mouth at bedtime.     Marland Kitchen warfarin (COUMADIN) 5 MG tablet Take as directed by [facility] 120  tablet 0   No current facility-administered medications for this visit.      Allergies:   Clonazepam   Social History:  The patient  reports that he quit smoking about 43 years ago. His smoking use included cigarettes. He has a 12.50 pack-year smoking history. He has never used smokeless tobacco. He reports that he does not drink alcohol or use drugs.   Family History:   family history includes Heart disease in his brother, mother, and sister.    Review of Systems: Review of Systems  Constitutional: Positive for malaise/fatigue.  Respiratory: Positive for shortness of breath.   Cardiovascular: Negative.   Gastrointestinal: Negative.   Musculoskeletal: Positive for back pain and joint pain.  Neurological: Negative.   All other systems reviewed and are negative.    PHYSICAL EXAM: VS:  BP (!) 120/50 (BP Location: Left Arm, Patient Position: Sitting, Cuff Size: Normal)   Pulse (!) 33   Ht 5\' 7"  (1.702 m)   Wt 225 lb 8 oz (102.3 kg)  SpO2 97%   BMI 35.32 kg/m  , BMI Body mass index is 35.32 kg/m.  Constitutional:  oriented to person, place, and time. No distress.  HENT:  Head: Normocephalic and atraumatic.  Eyes:  no discharge. No scleral icterus.  Neck: Normal range of motion. Neck supple. No JVD present.  Cardiovascular: Regular, bradycardic, normal heart sounds and intact distal pulses. Exam reveals no gallop and no friction rub. No edema No murmur heard. Pulmonary/Chest: Effort normal and breath sounds normal. No stridor. No respiratory distress.  no wheezes.  no rales.  no tenderness.  Abdominal: Soft.  no distension.  no tenderness.  Musculoskeletal: Normal range of motion.  no  tenderness or deformity.  Neurological:  normal muscle tone. Coordination normal. No atrophy Skin: Skin is warm and dry. No rash noted. not diaphoretic.  Psychiatric:  normal mood and affect. behavior is normal. Thought content normal.    Recent Labs: No results found for requested labs  within last 8760 hours.    Lipid Panel No results found for: CHOL, HDL, LDLCALC, TRIG    Wt Readings from Last 3 Encounters:  04/04/18 225 lb 8 oz (102.3 kg)  10/23/17 216 lb 3.2 oz (98.1 kg)  09/27/17 195 lb (88.5 kg)       ASSESSMENT AND PLAN:  Idioventricular rhythm/bradycardia Slow ventricular rate, low voltage Left bundle branch block, chronic issue Discussed with EP, who is recommended admission to [facility] for consideration of pacemaker We will hold warfarin, metoprolol, monitor on telemetry overnight Hemodynamically stable despite slow rate N.p.o. in the morning  Coronary artery disease involving native coronary artery of native heart with stable angina pectoris -  Previous echocardiogram with anterior wall hypokinesis, old MI Stress test showing no ischemia, dated August 2017 Denies any change in symptoms on today's visit apart from fatigue and shortness of breath likely from slow rate  Persistent atrial fibrillation ([place]) - Tolerating warfarin.  INR 2.0 Previously indicated he did not want NOAC.  Markedly slow rate, likely started 3 days ago Rate 27 up to 32  Secondary hypertension, unspecified Blood pressure is well controlled on today's visit. No medication changes Stable  Hyperlipidemia  on  Lipitor and Zetia  Weight up 30 pounds in the past several months Needs repeat lipid panel Previous noncompliance with several of his medications  S/P CABG x 3 Previous Stress test as above, no ischemia No further workup Active with no anginal symptoms, stable  Shortness of breath On Lasix twice a day,   No signs of fluid retention. Appears euvolemic Stable  Chronic low back pain Seen by Dr. Carloyn Manner Reports back pain is stable   Total encounter time more than 110 minutes  Greater than 50% was spent in counseling and coordination of care with the patient     Orders Placed This Encounter  Procedures  . EKG 12-Lead  . EKG 12-Lead     Signed, Esmond Plants,  M.D., Ph.D. 04/04/2018  [place], [place]

## 2018-04-04 ENCOUNTER — Encounter: Payer: Self-pay | Admitting: Cardiovascular Disease

## 2018-04-04 ENCOUNTER — Encounter (HOSPITAL_COMMUNITY): Payer: Self-pay | Admitting: *Deleted

## 2018-04-04 ENCOUNTER — Ambulatory Visit (INDEPENDENT_AMBULATORY_CARE_PROVIDER_SITE_OTHER): Payer: Medicare Other | Admitting: Cardiovascular Disease

## 2018-04-04 ENCOUNTER — Inpatient Hospital Stay (HOSPITAL_COMMUNITY)
Admission: AD | Admit: 2018-04-04 | Discharge: 2018-04-06 | DRG: 243 | Disposition: A | Discharge status: Home / Self Care | Payer: Medicare Other | Source: Ambulatory Visit | Attending: Physician Assistant | Admitting: Physician Assistant

## 2018-04-04 ENCOUNTER — Other Ambulatory Visit: Payer: Self-pay

## 2018-04-04 VITALS — BP 120/50 | HR 33 | Ht 67.0 in | Wt 225.5 lb

## 2018-04-04 DIAGNOSIS — F410 Panic disorder [episodic paroxysmal anxiety] without agoraphobia: Secondary | ICD-10-CM | POA: Diagnosis present

## 2018-04-04 DIAGNOSIS — I442 Atrioventricular block, complete: Secondary | ICD-10-CM | POA: Diagnosis present

## 2018-04-04 DIAGNOSIS — I25118 Atherosclerotic heart disease of native coronary artery with other forms of angina pectoris: Secondary | ICD-10-CM | POA: Diagnosis not present

## 2018-04-04 DIAGNOSIS — I482 Chronic atrial fibrillation: Secondary | ICD-10-CM

## 2018-04-04 DIAGNOSIS — R001 Bradycardia, unspecified: Secondary | ICD-10-CM | POA: Diagnosis present

## 2018-04-04 DIAGNOSIS — E559 Vitamin D deficiency, unspecified: Secondary | ICD-10-CM | POA: Diagnosis present

## 2018-04-04 DIAGNOSIS — R0602 Shortness of breath: Secondary | ICD-10-CM | POA: Diagnosis present

## 2018-04-04 DIAGNOSIS — M545 Low back pain: Secondary | ICD-10-CM | POA: Diagnosis present

## 2018-04-04 DIAGNOSIS — Z959 Presence of cardiac and vascular implant and graft, unspecified: Secondary | ICD-10-CM

## 2018-04-04 DIAGNOSIS — Z79899 Other long term (current) drug therapy: Secondary | ICD-10-CM

## 2018-04-04 DIAGNOSIS — Z951 Presence of aortocoronary bypass graft: Secondary | ICD-10-CM | POA: Diagnosis not present

## 2018-04-04 DIAGNOSIS — I2510 Atherosclerotic heart disease of native coronary artery without angina pectoris: Secondary | ICD-10-CM | POA: Diagnosis present

## 2018-04-04 DIAGNOSIS — I481 Persistent atrial fibrillation: Secondary | ICD-10-CM | POA: Diagnosis present

## 2018-04-04 DIAGNOSIS — K219 Gastro-esophageal reflux disease without esophagitis: Secondary | ICD-10-CM | POA: Diagnosis present

## 2018-04-04 DIAGNOSIS — M5432 Sciatica, left side: Secondary | ICD-10-CM | POA: Diagnosis present

## 2018-04-04 DIAGNOSIS — E6609 Other obesity due to excess calories: Secondary | ICD-10-CM | POA: Diagnosis present

## 2018-04-04 DIAGNOSIS — Z888 Allergy status to other drugs, medicaments and biological substances status: Secondary | ICD-10-CM | POA: Diagnosis not present

## 2018-04-04 DIAGNOSIS — I255 Ischemic cardiomyopathy: Secondary | ICD-10-CM | POA: Diagnosis present

## 2018-04-04 DIAGNOSIS — I519 Heart disease, unspecified: Secondary | ICD-10-CM | POA: Diagnosis not present

## 2018-04-04 DIAGNOSIS — F419 Anxiety disorder, unspecified: Secondary | ICD-10-CM | POA: Diagnosis present

## 2018-04-04 DIAGNOSIS — Z87442 Personal history of urinary calculi: Secondary | ICD-10-CM

## 2018-04-04 DIAGNOSIS — G8929 Other chronic pain: Secondary | ICD-10-CM | POA: Diagnosis present

## 2018-04-04 DIAGNOSIS — I5022 Chronic systolic (congestive) heart failure: Secondary | ICD-10-CM | POA: Diagnosis present

## 2018-04-04 DIAGNOSIS — Z85528 Personal history of other malignant neoplasm of kidney: Secondary | ICD-10-CM

## 2018-04-04 DIAGNOSIS — M3130 Wegener's granulomatosis without renal involvement: Secondary | ICD-10-CM

## 2018-04-04 DIAGNOSIS — E785 Hyperlipidemia, unspecified: Secondary | ICD-10-CM | POA: Diagnosis present

## 2018-04-04 DIAGNOSIS — Z9049 Acquired absence of other specified parts of digestive tract: Secondary | ICD-10-CM

## 2018-04-04 DIAGNOSIS — M109 Gout, unspecified: Secondary | ICD-10-CM | POA: Diagnosis present

## 2018-04-04 DIAGNOSIS — J449 Chronic obstructive pulmonary disease, unspecified: Secondary | ICD-10-CM | POA: Diagnosis present

## 2018-04-04 DIAGNOSIS — H9190 Unspecified hearing loss, unspecified ear: Secondary | ICD-10-CM | POA: Diagnosis present

## 2018-04-04 DIAGNOSIS — Z6835 Body mass index (BMI) 35.0-35.9, adult: Secondary | ICD-10-CM

## 2018-04-04 DIAGNOSIS — I130 Hypertensive heart and chronic kidney disease with heart failure and stage 1 through stage 4 chronic kidney disease, or unspecified chronic kidney disease: Secondary | ICD-10-CM | POA: Diagnosis present

## 2018-04-04 DIAGNOSIS — N189 Chronic kidney disease, unspecified: Secondary | ICD-10-CM | POA: Diagnosis present

## 2018-04-04 DIAGNOSIS — R55 Syncope and collapse: Secondary | ICD-10-CM | POA: Diagnosis present

## 2018-04-04 DIAGNOSIS — I447 Left bundle-branch block, unspecified: Secondary | ICD-10-CM | POA: Diagnosis present

## 2018-04-04 DIAGNOSIS — I252 Old myocardial infarction: Secondary | ICD-10-CM

## 2018-04-04 DIAGNOSIS — E1122 Type 2 diabetes mellitus with diabetic chronic kidney disease: Secondary | ICD-10-CM | POA: Diagnosis present

## 2018-04-04 DIAGNOSIS — I4819 Other persistent atrial fibrillation: Secondary | ICD-10-CM

## 2018-04-04 DIAGNOSIS — Z87891 Personal history of nicotine dependence: Secondary | ICD-10-CM

## 2018-04-04 DIAGNOSIS — M3131 Wegener's granulomatosis with renal involvement: Secondary | ICD-10-CM | POA: Diagnosis present

## 2018-04-04 DIAGNOSIS — I340 Nonrheumatic mitral (valve) insufficiency: Secondary | ICD-10-CM | POA: Diagnosis not present

## 2018-04-04 DIAGNOSIS — Z7901 Long term (current) use of anticoagulants: Secondary | ICD-10-CM

## 2018-04-04 DIAGNOSIS — I4821 Permanent atrial fibrillation: Secondary | ICD-10-CM

## 2018-04-04 DIAGNOSIS — Z96643 Presence of artificial hip joint, bilateral: Secondary | ICD-10-CM | POA: Diagnosis present

## 2018-04-04 DIAGNOSIS — I350 Nonrheumatic aortic (valve) stenosis: Secondary | ICD-10-CM | POA: Diagnosis present

## 2018-04-04 DIAGNOSIS — E7800 Pure hypercholesterolemia, unspecified: Secondary | ICD-10-CM

## 2018-04-04 DIAGNOSIS — Z8249 Family history of ischemic heart disease and other diseases of the circulatory system: Secondary | ICD-10-CM

## 2018-04-04 DIAGNOSIS — I159 Secondary hypertension, unspecified: Secondary | ICD-10-CM | POA: Diagnosis present

## 2018-04-04 HISTORY — DX: Bradycardia, unspecified: R00.1

## 2018-04-04 LAB — COMPREHENSIVE METABOLIC PANEL
ALBUMIN: 3.4 g/dL — AB (ref 3.5–5.0)
ALK PHOS: 46 U/L (ref 38–126)
ALT: 35 U/L (ref 17–63)
ANION GAP: 9 (ref 5–15)
AST: 29 U/L (ref 15–41)
BUN: 36 mg/dL — AB (ref 6–20)
CALCIUM: 8.7 mg/dL — AB (ref 8.9–10.3)
CO2: 23 mmol/L (ref 22–32)
Chloride: 102 mmol/L (ref 101–111)
Creatinine, Ser: 1.88 mg/dL — ABNORMAL HIGH (ref 0.61–1.24)
GFR calc Af Amer: 39 mL/min — ABNORMAL LOW (ref >60)
GFR calc non Af Amer: 34 mL/min — ABNORMAL LOW (ref >60)
GLUCOSE: 93 mg/dL (ref 65–99)
Potassium: 4.5 mmol/L (ref 3.5–5.1)
Sodium: 134 mmol/L — ABNORMAL LOW (ref 135–145)
TOTAL PROTEIN: 6.4 g/dL — AB (ref 6.5–8.1)
Total Bilirubin: 1 mg/dL (ref 0.3–1.2)

## 2018-04-04 LAB — PROTIME-INR
INR: 2.07
Prothrombin Time: 23.1 seconds — ABNORMAL HIGH (ref 11.4–15.2)

## 2018-04-04 LAB — CBC
HEMATOCRIT: 36.8 % — AB (ref 39.0–52.0)
Hemoglobin: 12.2 g/dL — ABNORMAL LOW (ref 13.0–17.0)
MCH: 32.2 pg (ref 26.0–34.0)
MCHC: 33.2 g/dL (ref 30.0–36.0)
MCV: 97.1 fL (ref 78.0–100.0)
Platelets: 216 10*3/uL (ref 150–400)
RBC: 3.79 MIL/uL — ABNORMAL LOW (ref 4.22–5.81)
RDW: 14.6 % (ref 11.5–15.5)
WBC: 8.1 10*3/uL (ref 4.0–10.5)

## 2018-04-04 LAB — MRSA PCR SCREENING: MRSA by PCR: NEGATIVE

## 2018-04-04 LAB — TSH: TSH: 2.386 u[IU]/mL (ref 0.350–4.500)

## 2018-04-04 LAB — APTT: aPTT: 36 seconds (ref 24–36)

## 2018-04-04 MED ORDER — DICLOFENAC SODIUM 1 % TD GEL
4.0000 g | Freq: Four times a day (QID) | TRANSDERMAL | Status: DC
  Administered 2018-04-05 (×2): 4 g via TOPICAL
  Filled 2018-04-04 (×2): qty 100

## 2018-04-04 MED ORDER — ATROPINE SULFATE 1 MG/10ML IJ SOSY
PREFILLED_SYRINGE | INTRAMUSCULAR | Status: AC
  Filled 2018-04-04: qty 10

## 2018-04-04 MED ORDER — FLUTICASONE PROPIONATE 50 MCG/ACT NA SUSP
2.0000 | Freq: Every day | NASAL | Status: DC
  Administered 2018-04-05: 22:00:00 2 via NASAL
  Filled 2018-04-04 (×2): qty 16

## 2018-04-04 MED ORDER — GABAPENTIN 100 MG PO CAPS
200.0000 mg | ORAL_CAPSULE | Freq: Two times a day (BID) | ORAL | Status: DC
  Administered 2018-04-05 – 2018-04-06 (×2): 200 mg via ORAL
  Filled 2018-04-04 (×3): qty 2

## 2018-04-04 MED ORDER — ATROPINE SULFATE 1 MG/10ML IJ SOSY: 0.5000 mg | PREFILLED_SYRINGE | INTRAMUSCULAR | Status: DC | PRN

## 2018-04-04 MED ORDER — PANTOPRAZOLE SODIUM 40 MG PO TBEC
40.0000 mg | DELAYED_RELEASE_TABLET | Freq: Every day | ORAL | Status: DC
  Administered 2018-04-04 – 2018-04-06 (×3): 40 mg via ORAL
  Filled 2018-04-04 (×3): qty 1

## 2018-04-04 MED ORDER — TAMSULOSIN HCL 0.4 MG PO CAPS
0.4000 mg | ORAL_CAPSULE | Freq: Every day | ORAL | Status: DC
  Administered 2018-04-05: 0.4 mg via ORAL
  Filled 2018-04-04: qty 1

## 2018-04-04 MED ORDER — NIACIN ER (ANTIHYPERLIPIDEMIC) 500 MG PO TBCR
500.0000 mg | EXTENDED_RELEASE_TABLET | Freq: Every day | ORAL | Status: DC
  Administered 2018-04-05 – 2018-04-06 (×2): 500 mg via ORAL
  Filled 2018-04-04 (×3): qty 1

## 2018-04-04 MED ORDER — ONDANSETRON HCL 4 MG/2ML IJ SOLN: 4.0000 mg | Freq: Four times a day (QID) | INTRAMUSCULAR | Status: DC | PRN

## 2018-04-04 MED ORDER — VITAMIN D 1000 UNITS PO TABS
2000.0000 [IU] | ORAL_TABLET | Freq: Every day | ORAL | Status: DC
  Administered 2018-04-05 – 2018-04-06 (×2): 2000 [IU] via ORAL
  Filled 2018-04-04 (×2): qty 2

## 2018-04-04 MED ORDER — ATORVASTATIN CALCIUM 40 MG PO TABS
40.0000 mg | ORAL_TABLET | Freq: Every day | ORAL | Status: DC
  Administered 2018-04-05 – 2018-04-06 (×2): 40 mg via ORAL
  Filled 2018-04-04 (×2): qty 1

## 2018-04-04 MED ORDER — ACETAMINOPHEN 325 MG PO TABS: 650.0000 mg | ORAL_TABLET | Freq: Three times a day (TID) | ORAL | Status: DC | PRN

## 2018-04-04 MED ORDER — HEPARIN (PORCINE) IN NACL 100-0.45 UNIT/ML-% IJ SOLN
1250.0000 [IU]/h | INTRAMUSCULAR | Status: DC
  Administered 2018-04-04: 1250 [IU]/h via INTRAVENOUS
  Filled 2018-04-04: qty 250

## 2018-04-04 MED ORDER — PANTOPRAZOLE SODIUM 40 MG PO TBEC: 40.0000 mg | DELAYED_RELEASE_TABLET | Freq: Every day | ORAL | Status: DC

## 2018-04-04 MED ORDER — EZETIMIBE 10 MG PO TABS
10.0000 mg | ORAL_TABLET | Freq: Every day | ORAL | Status: DC
  Administered 2018-04-05 – 2018-04-06 (×2): 10 mg via ORAL
  Filled 2018-04-04 (×2): qty 1

## 2018-04-04 MED ORDER — FLUOXETINE HCL 10 MG PO CAPS
10.0000 mg | ORAL_CAPSULE | Freq: Every day | ORAL | Status: DC
  Administered 2018-04-05: 10 mg via ORAL
  Filled 2018-04-04 (×2): qty 1

## 2018-04-04 NOTE — Progress Notes (Signed)
Report called to Junie Panning RN at [facility] and patient then taken by wheelchair to his personal vehicle with grandson and wife. He remained asymptomatic, alert, and oriented with reports of just feeling tired. Instructed Grandson to transport him to [facility] in [place] and to go in at [facility] and to admissions on the left. He verbalized understanding with no further questions at this time.

## 2018-04-04 NOTE — Patient Instructions (Signed)
We will transfer you to [facility] for evaluation of pacer given her bradycardia  Medication Instructions:   Stop your metoprolol and warfarin  Labwork:  No new labs needed  Testing/Procedures:  No further testing at this time   Follow-Up: It was a pleasure seeing you in the office today. Please call us if you have new issues that need to be addressed before your next appt.  [phone]  Your physician wants you to follow-up in: 1 month.    If you need a refill on your cardiac medications before your next appointment, please call your pharmacy.  For educational health videos Log in to : [email or web address] Or : [email or web address], password : [email or web address]

## 2018-04-04 NOTE — H&P (Signed)
Cardiology Consultation:   [name]; [id number]; [birth year]   Admit date: 04/04/2018 Date of Consult: 04/04/2018  Primary Care Provider: Burnard Hawthorne, FNP Primary Cardiologist: Esmond Plants, [place]    Patient Profile:   Mr. Jesus Hendrix is a very pleasant 72 year old gentleman with  Persistent atrial fibrillation chronic back pain and sciatica down his left leg,  coronary artery disease and bypass surgery in 2007,  Wegener's granulomatosis,  hyperlipidemia,  obesity,  kidney cancer,  status post laser ablation at [facility],  previous history of dizziness which he attributes to his Wegener's Who is being admitted to the hospital for complete heart block, Idioventricular escape rhythm 27 up to 30 bpm With symptoms of shortness of breath, fatigue for the past 3 days    History of Present Illness:    Reports that he was doing well until the past 3 days  reports having increasing shortness of breath, fatigue, general malaise  New symptoms for him, feels there is a change or problem Denies any PND orthopnea, only seems to have symptoms when walking Sitting, feels fine Denies any orthostasis symptoms No recent illnesses, no chest pain on exertion  EKG personally reviewed by myself on todays visit  shows atrial fibrillation left bundle branch block ventricular rate 32 bpm Previously ventricular rate 86 bpm on last  office visit  Chronic severe back pain, walking with a limp Followed by  Dr. 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  Echocardiogram May 2015 with mild aortic valve stenosis, results were discussed with him  cardiac catheterization in January 2007 details 75-80% long lesion in the LAD at the ostium, 95% lesion in the PDA. He was referred to bypass surgery. 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 Defect worse in the inferoapical region. No ischemia. Ejection fraction 55%. Exercised for 7 METS  Previous history of myalgias on high-dose cholesterol medication. Tolerating Lipitor 30 mg daily    Past Medical History:  Diagnosis Date  . Anxiety   . Aortic stenosis    a. mild on echo 04/2014  . Atrial fibrillation ([place]) 06/06/2016   a. newly diagnosed 06/06/2016; b. CHADS2VASc => 5 (CHF, HTN, age x 1, DM, vascular disease)  . Choledocholithiasis   . Chronic kidney disease   . Chronic systolic CHF (congestive heart failure) ([place])    a. echo 04/2014: EF 45-50%, nl LV dia fxn, mild AS, LA mildly dilated, PASP nl  . COPD (chronic obstructive pulmonary disease) ([place])    Not on home oxygen  . Coronary artery disease    a. status post CABG in 2007; b. nuc stress test 2014: small region of mild ischemia in the inferior territory, EF 61%, no ischemia, scan similar to prior scan in 2009  . Exogenous obesity   . GERD (gastroesophageal reflux disease)   .  Gout   . Hiatal hernia   . Hip pain, chronic 08/03/2016   Waiting to have hip replacement pending NM stress test results per patient.  . History of colonic diverticulitis   . History of renal cell carcinoma    a. s/p laser ablation   . Hyperlipidemia   . Hypertension   . Near syncope    a. 3 separate events in 2017  . Nephrolithiasis   . Panic  attacks   . Spinal stenosis   . Temporomandibular joint pain dysfunction syndrome   . Umbilical hernia   . Vitamin D deficiency   . Wegener's granulomatosis with renal involvement [facility])     Past Surgical History:  Procedure Laterality Date  . CARDIAC CATHETERIZATION    . CHOLECYSTECTOMY    . COLONOSCOPY N/A 06/03/2015   Procedure: COLONOSCOPY;  Surgeon: Lucilla Lame, MD;  Location: [place];  Service: Gastroenterology;  Laterality: N/A;  . CORONARY ARTERY BYPASS GRAFT  2004   CABG x 3  . EYE SURGERY    . HERNIA REPAIR     x 2  . HIP SURGERY     right hip replacement  . INTRAOCULAR LENS INSERTION    . KIDNEY SURGERY     cancer  . NOSE SURGERY    . POLYPECTOMY  06/03/2015   Procedure: POLYPECTOMY;  Surgeon: Lucilla Lame, MD;  Location: [place];  Service: Gastroenterology;;  . PROSTATE SURGERY    . TOTAL HIP ARTHROPLASTY Left 12/19/2016   Procedure: LEFT TOTAL HIP ARTHROPLASTY;  Surgeon: Garald Balding, MD;  Location: [place];  Service: Orthopedics;  Laterality: Left;     Home Medications:  Prior to Admission medications   Medication Sig Start Date End Date Taking? Authorizing Provider  acetaminophen (TYLENOL) 325 MG tablet Take 650 mg by mouth 3 (three) times daily as needed for moderate pain.    [provider]  atorvastatin (LIPITOR) 40 MG tablet Take 1 tablet (40 mg total) by mouth daily. 09/03/17   Minna Merritts, MD  Cholecalciferol (VITAMIN D) 2000 units CAPS Take 2,000 Units by mouth daily.    [provider]  diclofenac sodium (VOLTAREN) 1 % GEL Apply 4 g 4 (four) times daily topically. 10/23/17   Burnard Hawthorne, FNP  ezetimibe (ZETIA) 10 MG tablet Take 1 tablet (10 mg total) by mouth daily. 09/03/17   Minna Merritts, MD  FLUoxetine (PROZAC) 10 MG capsule Take 1 capsule (10 mg total) daily by mouth. 10/23/17   Burnard Hawthorne, FNP  fluticasone (FLONASE) 50 MCG/ACT nasal spray Place 2 sprays into both nostrils at bedtime.      [provider]  furosemide (LASIX) 20 MG tablet Take 1 tablet (20 mg total) by mouth 2 (two) times daily as needed for fluid or edema. Patient taking differently: Take 40 mg by mouth daily.  06/08/16   Minna Merritts, MD  gabapentin (NEURONTIN) 100 MG capsule Take 2 capsules (200 mg total) by mouth 2 (two) times daily at 8 am and 10 pm. 05/07/17   Jessy Oto, MD  losartan (COZAAR) 50 MG tablet Take 0.5 tablets (25 mg total) by mouth at bedtime. 09/03/17   Minna Merritts, MD  metoprolol succinate (TOPROL-XL) 25 MG 24 hr tablet Take 1 tablet (25 mg total) by mouth daily. 09/03/17   Minna Merritts, MD  niacin (NIASPAN) 500 MG CR tablet Take 500 mg by mouth daily.    [provider]  pantoprazole (PROTONIX) 40  MG tablet Take 40 mg by mouth daily.    [provider]  tamsulosin (FLOMAX) 0.4 MG CAPS capsule Take 0.4 mg by mouth at bedtime.  01/28/15   [provider]  warfarin (COUMADIN) 5 MG tablet Take as directed by [facility] 12/17/17   Minna Merritts, MD    Inpatient Medications: Scheduled Meds:  Continuous Infusions:  PRN Meds:   Allergies:    Allergies  Allergen Reactions  . Clonazepam Other (See Comments)    Patient is confused for up to 12+ hours after administration. Patient is confused for up to 12+ hours after administration.    Social History:   Social History   Socioeconomic History  . Marital status: Married    Spouse name: Not on file  . Number of children: Not on file  . Years of education: Not on file  . Highest education level: Not on file  Occupational History  . Not on file  Social Needs  . Financial resource strain: Not on file  . Food insecurity:    Worry: Not on file    Inability: Not on file  . Transportation needs:    Medical: Not on file    Non-medical: Not on file  Tobacco Use  . Smoking status: Former Smoker    Packs/day: 0.50    Years: 25.00    Pack years: 12.50    Types: Cigarettes     Last attempt to quit: 12/29/1974    Years since quitting: 43.2  . Smokeless tobacco: Never Used  Substance and Sexual Activity  . Alcohol use: No    Alcohol/week: 1.2 oz    Types: 2 Cans of beer per week    Comment: social  . Drug use: No  . Sexual activity: Not on file  Lifestyle  . Physical activity:    Days per week: Not on file    Minutes per session: Not on file  . Stress: Not on file  Relationships  . Social connections:    Talks on phone: Not on file    Gets together: Not on file    Attends religious service: Not on file    Active member of club or organization: Not on file    Attends meetings of clubs or organizations: Not on file    Relationship status: Not on file  . Intimate partner violence:    Fear of current or ex partner: Not on file    Emotionally abused: Not on file    Physically abused: Not on file    Forced sexual activity: Not on file  Other Topics Concern  . Not on file  Social History Narrative  . Not on file    Family History:    Family History  Problem Relation Age of Onset  . Heart disease Mother   . Heart disease Sister   . Heart disease Brother      ROS:  Please see the history of present illness.  Review of Systems  Constitutional: Positive for malaise/fatigue.  Respiratory: Positive for shortness of breath.   Cardiovascular: Negative.   Gastrointestinal: Negative.   Musculoskeletal: Negative.   Neurological: Negative.   Psychiatric/Behavioral: Negative.   All other systems reviewed and are negative.   Physical Exam/Data:    VS:  BP (!) 120/50 (BP Location: Left Arm, Patient Position: Sitting, Cuff Size: Normal)   Pulse (!) 33   Ht 5\' 7"  (1.702 m)   Wt 225 lb 8 oz (102.3 kg)   SpO2 97%  BMI 35.32 kg/m  , BMI Body mass index is 35.32 kg/m.  General:  Well nourished, well developed, in no acute distress HEENT: normal Lymph: no adenopathy Neck: no JVD Endocrine:  No thryomegaly Vascular: No carotid bruits; FA pulses 2+  bilaterally without bruits  Cardiac:  normal S1, S2; markedly bradycardic, regular , no murmur  Lungs:  clear to auscultation bilaterally, no wheezing, rhonchi or rales  Abd: soft, nontender, no hepatomegaly  Ext: no edema Musculoskeletal:  No deformities, BUE and BLE strength normal and equal Skin: warm and dry  Neuro:  CNs 2-12 intact, no focal abnormalities noted Psych:  Normal affect   EKG:  The EKG was personally reviewed and demonstrates: Atrial fibrillation, left bundle branch block, markedly bradycardic consistent with heart block escape idioventricular rhythm  Telemetry:  Telemetry was personally reviewed and demonstrates: Pending  Relevant CV Studies:   Laboratory Data:  ChemistryNo results for input(s): NA, K, CL, CO2, GLUCOSE, BUN, CREATININE, CALCIUM, GFRNONAA, GFRAA, ANIONGAP in the last 168 hours.  No results for input(s): PROT, ALBUMIN, AST, ALT, ALKPHOS, BILITOT in the last 168 hours. HematologyNo results for input(s): WBC, RBC, HGB, HCT, MCV, MCH, MCHC, RDW, PLT in the last 168 hours. Cardiac EnzymesNo results for input(s): TROPONINI in the last 168 hours. No results for input(s): TROPIPOC in the last 168 hours.  BNPNo results for input(s): BNP, PROBNP in the last 168 hours.  DDimer No results for input(s): DDIMER in the last 168 hours.  Radiology/Studies:  No results found.  Assessment and Plan:   1) complete heart block Idioventricular escape rhythm/market bradycardia Slow ventricular rate, low voltage Left bundle branch block, chronic issue Hemodynamically stable despite slow rate Discussed with EP Caryl Comes and taylor), who  recommended admission to [facility] for [facility] of pacemaker We will hold warfarin, metoprolol, monitor on telemetry overnight N.p.o. in the morning  Coronary artery disease involving native coronary artery of native heart with stable angina pectoris -  Previous echocardiogram with anterior wall hypokinesis, old MI Stress test showing  no ischemia, dated August 2017 Denies any change in symptoms on today's visit apart from fatigue and shortness of breath likely from slow rate  Persistent atrial fibrillation ([place]) - Tolerating warfarin.  INR 2.0 Previously indicated he did not want NOAC.  Markedly slow rate today, likely started 3 days ago Rate 27 up to 32  Secondary hypertension, unspecified Blood pressure is well controlled on today's visit. No medication changes Stable  Hyperlipidemia  on  Lipitor and Zetia  Weight up 30 pounds in the past several months Needs repeat lipid panel Previous noncompliance with several of his medications  S/P CABG x 3 Previous Stress test as above, no ischemia No further workup Active with no anginal symptoms, stable  Shortness of breath On Lasix twice a day,   No signs of fluid retention. Appears euvolemic Stable  Chronic low back pain Seen by Dr. Carloyn Manner Reports back pain is stable   Total encounter time more than 110 minutes  Greater than 50% was spent in counseling and coordination of care with the patient  For questions or updates, please contact [place] Please consult [email or web address] for contact info under Cardiology/STEMI.   Signed, Ida Rogue, MD  04/04/2018 6:18 PM

## 2018-04-04 NOTE — Progress Notes (Signed)
ANTICOAGULATION CONSULT NOTE - Initial Consult  Pharmacy Consult for Heparin Indication: atrial fibrillation  Allergies  Allergen Reactions  . Clonazepam Other (See Comments)    Patient is confused for up to 12+ hours after administration. Patient is confused for up to 12+ hours after administration.    Patient Measurements: Height: 5\' 7"  (170.2 cm) Weight: 223 lb 1.7 oz (101.2 kg) IBW/kg (Calculated) : 66.1 Heparin Dosing Weight: 91  Vital Signs: Temp: 97.7 F (36.5 C) (04/18 2018) Temp Source: Oral (04/18 2018) BP: 115/61 (04/18 2115) Pulse Rate: 29 (04/18 2030)  Labs: Recent Labs    04/04/18 2045  HGB 12.2*  HCT 36.8*  PLT 216  APTT 36  LABPROT 23.1*  INR 2.07    CrCl cannot be calculated (Patient's most recent lab result is older than the maximum 21 days allowed.).   Medical History: Past Medical History:  Diagnosis Date  . Anxiety   . Aortic stenosis    a. mild on echo 04/2014  . Atrial fibrillation ([place]) 06/06/2016   a. newly diagnosed 06/06/2016; b. CHADS2VASc => 5 (CHF, HTN, age x 1, DM, vascular disease)  . Choledocholithiasis   . Chronic kidney disease   . Chronic systolic CHF (congestive heart failure) ([place])    a. echo 04/2014: EF 45-50%, nl LV dia fxn, mild AS, LA mildly dilated, PASP nl  . COPD (chronic obstructive pulmonary disease) ([place])    Not on home oxygen  . Coronary artery disease    a. status post CABG in 2007; b. nuc stress test 2014: small region of mild ischemia in the inferior territory, EF 61%, no ischemia, scan similar to prior scan in 2009  . Exogenous obesity   . GERD (gastroesophageal reflux disease)   . Gout   . Hiatal hernia   . Hip pain, chronic 08/03/2016   Waiting to have hip replacement pending NM stress test results per patient.  . History of colonic diverticulitis   . History of renal cell carcinoma    a. s/p laser ablation   . Hyperlipidemia   . Hypertension   . Near syncope    a. 3 separate events in 2017  .  Nephrolithiasis   . Panic attacks   . Spinal stenosis   . Temporomandibular joint pain dysfunction syndrome   . Umbilical hernia   . Vitamin D deficiency   . Wegener's granulomatosis with renal involvement [facility])     Assessment: 72 year old male on warfarin prior to admission to begin heparin in preparation for possible pacemaker tomorrow  INR on admission = 2.07  Goal of Therapy:  Heparin level 0.3-0.7 units/ml Monitor platelets by anticoagulation protocol: Yes   Plan:  No heparin bolus Heparin drip at 1250 units/hr Heparin level 8 hours after heparin begins Daily heparin level, CBC  Thank you Anette Guarneri, PharmD [phone] 04/04/2018,9:55 PM

## 2018-04-04 NOTE — Plan of Care (Signed)
  Problem: Education: Goal: Knowledge of General Education information will improve Outcome: Progressing   Problem: Education: Goal: Knowledge of disease or condition will improve Outcome: Progressing

## 2018-04-04 NOTE — Progress Notes (Signed)
Interval coverage note:  Mr. Jesus Hendrix is a very pleasant 72 year old gentleman with  Persistent atrial fibrillation chronic back pain and sciatica down his left leg,  coronary artery disease and bypass surgery in 2007,  Wegener's granulomatosis,  hyperlipidemia,  obesity,  kidney cancer,  status post laser ablation at [facility],  previous history of dizziness which he attributes to his Wegener's Who is being admitted to the hospital for complete heart block, Idioventricular escape rhythm 27 up to 30 bpm With symptoms of shortness of breath, fatigue for the past 3 days   Seen by Dr. Rockey Situ and transferred to [facility] for admission. Alert and orient x3, cracking jokes all around. Seems to be comfortable with 5 family member at bedside.   Admission order placed. SBP initial 90s, repeat BP 120/61, HR 20-30s. Atropine at bedside, instructed the nurse to give if SBP < 95 or HR < 25. He appears to be asymptomatic. Since his SBP was in the 90s earlier, I held off on all BP meds. Hold coumadin, IV heparin for pharmacy.    Hilbert Corrigan PA Pager: [phone]

## 2018-04-05 ENCOUNTER — Inpatient Hospital Stay (HOSPITAL_COMMUNITY)
Admission: AD | Disposition: A | Discharge status: Home / Self Care | Payer: Self-pay | Source: Ambulatory Visit | Attending: Cardiology

## 2018-04-05 ENCOUNTER — Inpatient Hospital Stay (HOSPITAL_COMMUNITY): Payer: Medicare Other

## 2018-04-05 DIAGNOSIS — R001 Bradycardia, unspecified: Secondary | ICD-10-CM

## 2018-04-05 DIAGNOSIS — I340 Nonrheumatic mitral (valve) insufficiency: Secondary | ICD-10-CM

## 2018-04-05 DIAGNOSIS — I442 Atrioventricular block, complete: Secondary | ICD-10-CM

## 2018-04-05 DIAGNOSIS — I5022 Chronic systolic (congestive) heart failure: Secondary | ICD-10-CM

## 2018-04-05 DIAGNOSIS — I519 Heart disease, unspecified: Secondary | ICD-10-CM

## 2018-04-05 HISTORY — PX: PACEMAKER IMPLANT: EP1218

## 2018-04-05 LAB — CBC
HCT: 36.1 % — ABNORMAL LOW (ref 39.0–52.0)
Hemoglobin: 11.9 g/dL — ABNORMAL LOW (ref 13.0–17.0)
MCH: 32.2 pg (ref 26.0–34.0)
MCHC: 33 g/dL (ref 30.0–36.0)
MCV: 97.6 fL (ref 78.0–100.0)
Platelets: 201 10*3/uL (ref 150–400)
RBC: 3.7 MIL/uL — ABNORMAL LOW (ref 4.22–5.81)
RDW: 15.1 % (ref 11.5–15.5)
WBC: 5.9 10*3/uL (ref 4.0–10.5)

## 2018-04-05 LAB — BASIC METABOLIC PANEL
Anion gap: 9 (ref 5–15)
BUN: 34 mg/dL — AB (ref 6–20)
CO2: 21 mmol/L — AB (ref 22–32)
Calcium: 8.7 mg/dL — ABNORMAL LOW (ref 8.9–10.3)
Chloride: 107 mmol/L (ref 101–111)
Creatinine, Ser: 1.74 mg/dL — ABNORMAL HIGH (ref 0.61–1.24)
GFR calc Af Amer: 43 mL/min — ABNORMAL LOW (ref >60)
GFR, EST NON AFRICAN AMERICAN: 37 mL/min — AB (ref >60)
GLUCOSE: 92 mg/dL (ref 65–99)
Potassium: 4.5 mmol/L (ref 3.5–5.1)
Sodium: 137 mmol/L (ref 135–145)

## 2018-04-05 LAB — ECHOCARDIOGRAM COMPLETE
HEIGHTINCHES: 67 in
Weight: 3569.69 oz

## 2018-04-05 LAB — LIPID PANEL
Cholesterol: 132 mg/dL (ref 0–200)
HDL: 29 mg/dL — ABNORMAL LOW (ref >40)
LDL CALC: 91 mg/dL (ref 0–99)
Total CHOL/HDL Ratio: 4.6 RATIO
Triglycerides: 58 mg/dL (ref <150)
VLDL: 12 mg/dL (ref 0–40)

## 2018-04-05 LAB — SURGICAL PCR SCREEN
MRSA, PCR: NEGATIVE
Staphylococcus aureus: NEGATIVE

## 2018-04-05 LAB — HEPARIN LEVEL (UNFRACTIONATED): Heparin Unfractionated: 0.23 IU/mL — ABNORMAL LOW (ref 0.30–0.70)

## 2018-04-05 LAB — PROTIME-INR
INR: 2.08
Prothrombin Time: 23.2 seconds — ABNORMAL HIGH (ref 11.4–15.2)

## 2018-04-05 SURGERY — PACEMAKER IMPLANT

## 2018-04-05 MED ORDER — SODIUM CHLORIDE 0.9 % IV SOLN
INTRAVENOUS | Status: DC
  Administered 2018-04-05: 14:00:00 via INTRAVENOUS

## 2018-04-05 MED ORDER — GENTAMICIN SULFATE 40 MG/ML IJ SOLN
INTRAMUSCULAR | Status: AC
  Filled 2018-04-05: qty 2

## 2018-04-05 MED ORDER — CEFAZOLIN SODIUM-DEXTROSE 2-4 GM/100ML-% IV SOLN
2.0000 g | INTRAVENOUS | Status: AC
  Administered 2018-04-05: 2 g via INTRAVENOUS

## 2018-04-05 MED ORDER — IOPAMIDOL (ISOVUE-370) INJECTION 76%
INTRAVENOUS | Status: AC
  Filled 2018-04-05: qty 100

## 2018-04-05 MED ORDER — SODIUM CHLORIDE 0.9% FLUSH
3.0000 mL | Freq: Two times a day (BID) | INTRAVENOUS | Status: DC
  Administered 2018-04-05: 22:00:00 3 mL via INTRAVENOUS

## 2018-04-05 MED ORDER — ONDANSETRON HCL 4 MG/2ML IJ SOLN: 4.0000 mg | Freq: Four times a day (QID) | INTRAMUSCULAR | Status: DC | PRN

## 2018-04-05 MED ORDER — SODIUM CHLORIDE 0.9% FLUSH: 3.0000 mL | Freq: Two times a day (BID) | INTRAVENOUS | Status: DC

## 2018-04-05 MED ORDER — SODIUM CHLORIDE 0.9 % IV SOLN: 250.0000 mL | INTRAVENOUS | Status: DC | PRN

## 2018-04-05 MED ORDER — SODIUM CHLORIDE 0.9 % IV SOLN
250.0000 mL | INTRAVENOUS | Status: DC
  Administered 2018-04-05: 250 mL via INTRAVENOUS

## 2018-04-05 MED ORDER — YOU HAVE A PACEMAKER BOOK
Freq: Once | Status: AC
  Administered 2018-04-06: 02:00:00
  Filled 2018-04-05: qty 1

## 2018-04-05 MED ORDER — CEFAZOLIN SODIUM-DEXTROSE 1-4 GM/50ML-% IV SOLN
1.0000 g | Freq: Four times a day (QID) | INTRAVENOUS | Status: AC
  Administered 2018-04-05 – 2018-04-06 (×3): 1 g via INTRAVENOUS
  Filled 2018-04-05 (×3): qty 50

## 2018-04-05 MED ORDER — IOPAMIDOL (ISOVUE-370) INJECTION 76%
INTRAVENOUS | Status: AC
  Filled 2018-04-05: qty 50

## 2018-04-05 MED ORDER — FENTANYL CITRATE (PF) 100 MCG/2ML IJ SOLN
INTRAMUSCULAR | Status: AC
  Filled 2018-04-05: qty 2

## 2018-04-05 MED ORDER — CHLORHEXIDINE GLUCONATE 4 % EX LIQD: 60.0000 mL | Freq: Once | CUTANEOUS | Status: DC

## 2018-04-05 MED ORDER — HEPARIN (PORCINE) IN NACL 1000-0.9 UT/500ML-% IV SOLN
INTRAVENOUS | Status: AC
  Filled 2018-04-05: qty 500

## 2018-04-05 MED ORDER — HEPARIN (PORCINE) IN NACL 2-0.9 UNITS/ML
INTRAMUSCULAR | Status: AC | PRN
  Administered 2018-04-05: 500 mL

## 2018-04-05 MED ORDER — LIDOCAINE HCL (PF) 1 % IJ SOLN
INTRAMUSCULAR | Status: DC | PRN
  Administered 2018-04-05: 80 mL

## 2018-04-05 MED ORDER — LIDOCAINE HCL 1 % IJ SOLN
INTRAMUSCULAR | Status: AC
  Filled 2018-04-05: qty 20

## 2018-04-05 MED ORDER — CHLORHEXIDINE GLUCONATE 4 % EX LIQD
60.0000 mL | Freq: Once | CUTANEOUS | Status: DC
  Filled 2018-04-05: qty 60

## 2018-04-05 MED ORDER — MIDAZOLAM HCL 5 MG/5ML IJ SOLN
INTRAMUSCULAR | Status: DC | PRN
  Administered 2018-04-05: 1 mg via INTRAVENOUS

## 2018-04-05 MED ORDER — PERFLUTREN LIPID MICROSPHERE
1.0000 mL | INTRAVENOUS | Status: DC | PRN
  Administered 2018-04-05: 2 mL via INTRAVENOUS
  Filled 2018-04-05: qty 10

## 2018-04-05 MED ORDER — ACETAMINOPHEN 325 MG PO TABS: 325.0000 mg | ORAL_TABLET | ORAL | Status: DC | PRN

## 2018-04-05 MED ORDER — SODIUM CHLORIDE 0.9% FLUSH: 3.0000 mL | INTRAVENOUS | Status: DC | PRN

## 2018-04-05 MED ORDER — LIDOCAINE HCL 1 % IJ SOLN
INTRAMUSCULAR | Status: AC
  Filled 2018-04-05: qty 60

## 2018-04-05 MED ORDER — SODIUM CHLORIDE 0.9 % IV SOLN
80.0000 mg | INTRAVENOUS | Status: AC
  Administered 2018-04-05: 80 mg

## 2018-04-05 MED ORDER — HYDROCODONE-ACETAMINOPHEN 5-325 MG PO TABS
1.0000 | ORAL_TABLET | ORAL | Status: DC | PRN
  Administered 2018-04-06 (×2): 1 via ORAL
  Filled 2018-04-05 (×2): qty 1

## 2018-04-05 MED ORDER — MIDAZOLAM HCL 5 MG/5ML IJ SOLN
INTRAMUSCULAR | Status: AC
  Filled 2018-04-05: qty 5

## 2018-04-05 MED ORDER — CEFAZOLIN SODIUM-DEXTROSE 2-4 GM/100ML-% IV SOLN
INTRAVENOUS | Status: AC
  Filled 2018-04-05: qty 100

## 2018-04-05 SURGICAL SUPPLY — 18 items
ADAPTER SEALING SSA-EW-09 (MISCELLANEOUS) ×2 IMPLANT
ADPR INTRO LNG 9FR SL XTD WNG (MISCELLANEOUS) ×1
ALLURE CRT PM3262 (Pacemaker) ×3 IMPLANT
CABLE SURGICAL S-101-97-12 (CABLE) ×2 IMPLANT
CATH ATTAIN COM SURV 6250V-EH (CATHETERS) ×2 IMPLANT
CATH ATTAIN COM SURV 6250V-MB2 (CATHETERS) ×2 IMPLANT
CATH HEXAPOLAR DAMATO 6F (CATHETERS) ×2 IMPLANT
LEAD QUARTET 1458QL-86 (Lead) IMPLANT
LEAD TENDRIL MRI 58CM LPA1200M (Lead) ×2 IMPLANT
PACEMAKER ALLURE CRT (Pacemaker) IMPLANT
PAD DEFIB LIFELINK (PAD) ×2 IMPLANT
QUARTET 1458QL-86 (Lead) ×3 IMPLANT
SHEATH CLASSIC 8F (SHEATH) ×2 IMPLANT
SHEATH CLASSIC 9.5F (SHEATH) ×2 IMPLANT
SLITTER 6232ADJ (MISCELLANEOUS) ×2 IMPLANT
TRAY PACEMAKER INSERTION (PACKS) ×2 IMPLANT
WIRE ACUITY WHISPER EDS 4648 (WIRE) ×2 IMPLANT
WIRE HI TORQ VERSACORE-J 145CM (WIRE) ×2 IMPLANT

## 2018-04-05 NOTE — Progress Notes (Signed)
ANTICOAGULATION CONSULT NOTE - Follow Up Consult  Pharmacy Consult for Heparin Indication: atrial fibrillation  Allergies  Allergen Reactions  . Clonazepam Other (See Comments)    Patient is confused for up to 12+ hours after administration. Patient is confused for up to 12+ hours after administration.    Patient Measurements: Height: 5\' 7"  (170.2 cm) Weight: 223 lb 1.7 oz (101.2 kg) IBW/kg (Calculated) : 66.1 Heparin Dosing Weight: 91  Vital Signs: Temp: 97.7 F (36.5 C) (04/19 0500) Temp Source: Oral (04/19 0500) BP: 137/59 (04/19 0600) Pulse Rate: 31 (04/19 0600)  Labs: Recent Labs    04/04/18 2045 04/05/18 0653  HGB 12.2* 11.9*  HCT 36.8* 36.1*  PLT 216 201  APTT 36  --   LABPROT 23.1*  --   INR 2.07  --   HEPARINUNFRC  --  0.23*  CREATININE 1.88*  --     Estimated Creatinine Clearance: 39.6 mL/min (A) (by C-G formula based on SCr of 1.88 mg/dL (H)).   Medical History: Past Medical History:  Diagnosis Date  . Anxiety   . Aortic stenosis    a. mild on echo 04/2014  . Atrial fibrillation ([place]) 06/06/2016   a. newly diagnosed 06/06/2016; b. CHADS2VASc => 5 (CHF, HTN, age x 1, DM, vascular disease)  . Choledocholithiasis   . Chronic kidney disease   . Chronic systolic CHF (congestive heart failure) ([place])    a. echo 04/2014: EF 45-50%, nl LV dia fxn, mild AS, LA mildly dilated, PASP nl  . COPD (chronic obstructive pulmonary disease) ([place])    Not on home oxygen  . Coronary artery disease    a. status post CABG in 2007; b. nuc stress test 2014: small region of mild ischemia in the inferior territory, EF 61%, no ischemia, scan similar to prior scan in 2009  . Exogenous obesity   . GERD (gastroesophageal reflux disease)   . Gout   . Hiatal hernia   . Hip pain, chronic 08/03/2016   Waiting to have hip replacement pending NM stress test results per patient.  . History of colonic diverticulitis   . History of renal cell carcinoma    a. s/p laser ablation   .  Hyperlipidemia   . Hypertension   . Near syncope    a. 3 separate events in 2017  . Nephrolithiasis   . Panic attacks   . Spinal stenosis   . Temporomandibular joint pain dysfunction syndrome   . Umbilical hernia   . Vitamin D deficiency   . Wegener's granulomatosis with renal involvement [facility])     Assessment: 72 year old male on warfarin prior to admission for Afib on IV heparin in preparation for possible pacemaker tomorrow. IV heparin stopped since INR was 2.07 yesterday. INR remains above 2 this AM. H/H low stable. Plt wnl    Goal of Therapy:  Heparin level 0.3-0.7 units/ml Monitor platelets by anticoagulation protocol: Yes   Plan:  Continue to hold IV heparin Recheck INR this evening. Start IV heparin when INR < 2  Daily PT/INR   Albertina Parr, PharmD., BCPS Clinical Pharmacist Clinical phone for 04/05/18 until 3:30pm: [phone] If after 3:30pm, please call main pharmacy at: [phone]

## 2018-04-05 NOTE — Progress Notes (Signed)
  Echocardiogram 2D Echocardiogram has been performed.  Jennette Dubin 04/05/2018, 9:02 AM

## 2018-04-05 NOTE — H&P (View-Only) (Signed)
Cardiology Consultation:   [name]; [id number]; [birth year]   Admit date: 04/04/2018 Date of Consult: 04/05/2018  Primary Care Provider: Burnard Hawthorne, FNP Primary Cardiologist: Dr. Rockey Situ   Patient Profile:   Jesus Hendrix is a 72 y.o. male with a hx of CAD (remote CABG), CBP, HLD, renal ca s/p ablation tx, CRI, persistent AFib, Wegener's granulomatosis with some degree of dizziness associated with it, who is being seen today for the evaluation of symptomatic bradycardia at the request of Dr. Rockey Situ.  History of Present Illness:   Jesus Hendrix reports for about a week he has not had any kind of energy, getting winded and having to stop with minimal exertion, "I get tired just walking across the room".  He is still working full time in his Architect business and tells me that at his baseline he has very good exertional tolerance without limitations.  He has been more dizzy then usual but denies any near syncope or syncope.  No CP but reports an unusual heartburn that has been fairly persistent in this same time frame of about a week.  This does not change with exertion, his "usual indigestion pill" has not helped as it usually does.  He saw Dr. Rockey Situ yesterday noted with marked bradycardia and sent to the hospital.   LABS K+ 4.5 BUN/Creat 36/1.88 (baseline by available labs looks 1.3) WBC 8.1 H/H 12/36 Plts 216 INR 2.07 TSH 2.386  Last dose of home Toprol XL 25mg  was about 0800 yesterday, half life is 5-7 hours  Past Medical History:  Diagnosis Date  . Anxiety   . Aortic stenosis    a. mild on echo 04/2014  . Atrial fibrillation ([place]) 06/06/2016   a. newly diagnosed 06/06/2016; b. CHADS2VASc => 5 (CHF, HTN, age x 1, DM, vascular disease)  . Choledocholithiasis   . Chronic kidney disease   . Chronic systolic CHF (congestive heart failure) ([place])    a. echo 04/2014: EF 45-50%, nl LV dia fxn, mild AS, LA mildly dilated, PASP nl  . COPD (chronic obstructive  pulmonary disease) ([place])    Not on home oxygen  . Coronary artery disease    a. status post CABG in 2007; b. nuc stress test 2014: small region of mild ischemia in the inferior territory, EF 61%, no ischemia, scan similar to prior scan in 2009  . Exogenous obesity   . GERD (gastroesophageal reflux disease)   . Gout   . Hiatal hernia   . Hip pain, chronic 08/03/2016   Waiting to have hip replacement pending NM stress test results per patient.  . History of colonic diverticulitis   . History of renal cell carcinoma    a. s/p laser ablation   . Hyperlipidemia   . Hypertension   . Near syncope    a. 3 separate events in 2017  . Nephrolithiasis   . Panic attacks   . Spinal stenosis   . Temporomandibular joint pain dysfunction syndrome   . Umbilical hernia   . Vitamin D deficiency   . Wegener's granulomatosis with renal involvement [facility])     Past Surgical History:  Procedure Laterality Date  . CARDIAC CATHETERIZATION    . CHOLECYSTECTOMY    . COLONOSCOPY N/A 06/03/2015   Procedure: COLONOSCOPY;  Surgeon: Lucilla Lame, MD;  Location: [place];  Service: Gastroenterology;  Laterality: N/A;  . CORONARY ARTERY BYPASS GRAFT  2004   CABG x 3  . EYE SURGERY    . HERNIA REPAIR  x 2  . HIP SURGERY     right hip replacement  . INTRAOCULAR LENS INSERTION    . KIDNEY SURGERY     cancer  . NOSE SURGERY    . POLYPECTOMY  06/03/2015   Procedure: POLYPECTOMY;  Surgeon: Lucilla Lame, MD;  Location: [place];  Service: Gastroenterology;;  . PROSTATE SURGERY    . TOTAL HIP ARTHROPLASTY Left 12/19/2016   Procedure: LEFT TOTAL HIP ARTHROPLASTY;  Surgeon: Garald Balding, MD;  Location: [place];  Service: Orthopedics;  Laterality: Left;       Inpatient Medications: Scheduled Meds: . atorvastatin  40 mg Oral Daily  . atropine      . cholecalciferol  2,000 Units Oral Daily  . diclofenac sodium  4 g Topical QID  . ezetimibe  10 mg Oral Daily  . FLUoxetine  10 mg Oral  Daily  . fluticasone  2 spray Each Nare QHS  . gabapentin  200 mg Oral BID AC & HS  . niacin  500 mg Oral Daily  . pantoprazole  40 mg Oral Daily  . tamsulosin  0.4 mg Oral QHS   Continuous Infusions: . heparin 1,250 Units/hr (04/05/18 0600)   PRN Meds: acetaminophen, atropine, ondansetron (ZOFRAN) IV  Allergies:    Allergies  Allergen Reactions  . Clonazepam Other (See Comments)    Patient is confused for up to 12+ hours after administration. Patient is confused for up to 12+ hours after administration.    Social History:   Social History   Socioeconomic History  . Marital status: Married    Spouse name: Not on file  . Number of children: Not on file  . Years of education: Not on file  . Highest education level: Not on file  Occupational History  . Not on file  Social Needs  . Financial resource strain: Not on file  . Food insecurity:    Worry: Not on file    Inability: Not on file  . Transportation needs:    Medical: Not on file    Non-medical: Not on file  Tobacco Use  . Smoking status: Former Smoker    Packs/day: 0.50    Years: 25.00    Pack years: 12.50    Types: Cigarettes    Last attempt to quit: 12/29/1974    Years since quitting: 43.2  . Smokeless tobacco: Never Used  Substance and Sexual Activity  . Alcohol use: No    Alcohol/week: 1.2 oz    Types: 2 Cans of beer per week    Comment: social  . Drug use: No  . Sexual activity: Not on file  Lifestyle  . Physical activity:    Days per week: Not on file    Minutes per session: Not on file  . Stress: Not on file  Relationships  . Social connections:    Talks on phone: Not on file    Gets together: Not on file    Attends religious service: Not on file    Active member of club or organization: Not on file    Attends meetings of clubs or organizations: Not on file    Relationship status: Not on file  . Intimate partner violence:    Fear of current or ex partner: Not on file    Emotionally  abused: Not on file    Physically abused: Not on file    Forced sexual activity: Not on file  Other Topics Concern  . Not on file  Social History  Narrative  . Not on file    Family History:   Family History  Problem Relation Age of Onset  . Heart disease Mother   . Heart disease Sister   . Heart disease Brother      ROS:  Please see the history of present illness.  All other ROS reviewed and negative.     Physical Exam/Data:   Vitals:   04/05/18 0300 04/05/18 0400 04/05/18 0500 04/05/18 0600  BP: (!) 126/54 135/67 (!) 142/46 (!) 137/59  Pulse: (!) 31 (!) 33 (!) 33 (!) 31  Resp: 14 14 14 17   Temp:   97.7 F (36.5 C)   TempSrc:   Oral   SpO2: 99% 95% 97% 97%  Weight:  223 lb 1.7 oz (101.2 kg)    Height:        Intake/Output Summary (Last 24 hours) at 04/05/2018 0710 Last data filed at 04/05/2018 0600 Gross per 24 hour  Intake 96.04 ml  Output 550 ml  Net -453.96 ml   Filed Weights   04/04/18 1900 04/04/18 2000 04/05/18 0400  Weight: 222 lb 10.6 oz (101 kg) 223 lb 1.7 oz (101.2 kg) 223 lb 1.7 oz (101.2 kg)   Body mass index is 34.94 kg/m.  General:  Well nourished, well developed, in no acute distress HEENT: normal Lymph: no adenopathy Neck: no JVD Endocrine:  No thryomegaly Vascular: No carotid bruits; FA pulses 2+ bilaterally without bruits  Cardiac:  iRRR; bradycardic, 1-2/6 SM, no gallops or rubs Lungs:  CTA b/l, no wheezing, rhonchi or rales  Abd: soft, nontender Ext: no edema Musculoskeletal:  No deformities Skin: warm and dry  Neuro:  No gross focal abnormalities noted, hard of hearing Psych:  Normal affect   EKG:  The EKG was personally reviewed and demonstrates:   Yesterday's, AFib, V rate 27-32bpm, LBBB not new Telemetry:  Telemetry was personally reviewed and demonstrates:   AFib 30's, overnight dipping to 20's, while we are talking 30's-40's  Relevant CV Studies:  06/13/18: TTE Study Conclusions - Left ventricle: The cavity size was  normal. Systolic function was   mildly to moderately reduced. The estimated ejection fraction was   in the range of 40% to 45%. Hypokinesis of the anteroseptal   myocardium. Hypokinesis of the anterior myocardium. The study is   not technically sufficient to allow evaluation of LV diastolic   function. - Aortic valve: There was very mild stenosis. Mean gradient (S): 11   mm Hg. Peak gradient (S): 23 mm Hg. Valve area (VTI): 1.35 cm^2. - Mitral valve: There was mild regurgitation. - Left atrium: The atrium was moderately dilated. - Pulmonary arteries: Systolic pressure was within the normal   rage. Impressions: - Challenging image quality.  2015: LVEF 45-50%  Laboratory Data:  Chemistry Recent Labs  Lab 04/04/18 2045  NA 134*  K 4.5  CL 102  CO2 23  GLUCOSE 93  BUN 36*  CREATININE 1.88*  CALCIUM 8.7*  GFRNONAA 34*  GFRAA 39*  ANIONGAP 9    Recent Labs  Lab 04/04/18 2045  PROT 6.4*  ALBUMIN 3.4*  AST 29  ALT 35  ALKPHOS 46  BILITOT 1.0   Hematology Recent Labs  Lab 04/04/18 2045  WBC 8.1  RBC 3.79*  HGB 12.2*  HCT 36.8*  MCV 97.1  MCH 32.2  MCHC 33.2  RDW 14.6  PLT 216   Cardiac EnzymesNo results for input(s): TROPONINI in the last 168 hours. No results for input(s): TROPIPOC in the last  168 hours.  BNPNo results for input(s): BNP, PROBNP in the last 168 hours.  DDimer No results for input(s): DDIMER in the last 168 hours.  Radiology/Studies:  No results found.  Assessment and Plan:   1. Symptomatic bradycardia     Last dose of Toprol was 0800 yesterday, 5 1/2 lives are 25-35 hours .  He requires beta blocker therapy long term for his chronic CHF.     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  BP has been stable, at rest patient denies symptoms, mentating well.  Dr. Rayann Heman to see  2. Permanent AFib     CHA2DS2Vasc is at least 3, on warfarin out pt  3. CAD     No typical sounding symptoms of angina     He has been having an unusual indigestion in this past week ?     For questions or updates, please contact [place] Please consult [email or web address] for contact info under Cardiology/STEMI.   Signed, Baldwin Jamaica, PA-C  04/05/2018 7:10 AM  I have seen, examined the patient, and reviewed the above assessment and plan.  Changes to above are made where necessary.  On exam bradycardic irregular rhythm.  Pt with permanent afib, sever biatrial enlargement, and complete heart block.  He requires beta blocker therapy long term for his CAD/ ischemic CM (EF 45%).  I would therefore recommend biventricular pacemaker implantation at this time.  Risks, benefits, alternatives to BiV  pacemaker implantation were discussed in detail with the patient today. The patient understands that the risks include but are not limited to bleeding, infection, pneumothorax, perforation, tamponade, vascular damage, renal failure, MI, stroke, death,  and lead dislodgement and wishes to proceed. We will therefore schedule the procedure at the next available time.  He is scheduled for this afternoon.  Heparin is discontinued.   Co Sign: [name], MD 04/05/2018 2:24 PM

## 2018-04-05 NOTE — Consult Note (Addendum)
Cardiology Consultation:   Patient ID: CAMBRIDGE DELEO; [id number]; [birth year]   Admit date: 04/04/2018 Date of Consult: 04/05/2018  Primary Care Provider: Burnard Hawthorne, FNP Primary Cardiologist: Dr. Rockey Situ   Patient Profile:   Jesus Hendrix is a 72 y.o. male with a hx of CAD (remote CABG), CBP, HLD, renal ca s/p ablation tx, CRI, persistent AFib, Wegener's granulomatosis with some degree of dizziness associated with it, who is being seen today for the evaluation of symptomatic bradycardia at the request of Dr. Rockey Situ.  History of Present Illness:   Mr. Jesus Hendrix reports for about a week he has not had any kind of energy, getting winded and having to stop with minimal exertion, "I get tired just walking across the room".  He is still working full time in his Architect business and tells me that at his baseline he has very good exertional tolerance without limitations.  He has been more dizzy then usual but denies any near syncope or syncope.  No CP but reports an unusual heartburn that has been fairly persistent in this same time frame of about a week.  This does not change with exertion, his "usual indigestion pill" has not helped as it usually does.  He saw Dr. Rockey Situ yesterday noted with marked bradycardia and sent to the hospital.   LABS K+ 4.5 BUN/Creat 36/1.88 (baseline by available labs looks 1.3) WBC 8.1 H/H 12/36 Plts 216 INR 2.07 TSH 2.386  Last dose of home Toprol XL 25mg  was about 0800 yesterday, half life is 5-7 hours  Past Medical History:  Diagnosis Date  . Anxiety   . Aortic stenosis    a. mild on echo 04/2014  . Atrial fibrillation ([place]) 06/06/2016   a. newly diagnosed 06/06/2016; b. CHADS2VASc => 5 (CHF, HTN, age x 1, DM, vascular disease)  . Choledocholithiasis   . Chronic kidney disease   . Chronic systolic CHF (congestive heart failure) ([place])    a. echo 04/2014: EF 45-50%, nl LV dia fxn, mild AS, LA mildly dilated, PASP nl  . COPD (chronic obstructive  pulmonary disease) ([place])    Not on home oxygen  . Coronary artery disease    a. status post CABG in 2007; b. nuc stress test 2014: small region of mild ischemia in the inferior territory, EF 61%, no ischemia, scan similar to prior scan in 2009  . Exogenous obesity   . GERD (gastroesophageal reflux disease)   . Gout   . Hiatal hernia   . Hip pain, chronic 08/03/2016   Waiting to have hip replacement pending NM stress test results per patient.  . History of colonic diverticulitis   . History of renal cell carcinoma    a. s/p laser ablation   . Hyperlipidemia   . Hypertension   . Near syncope    a. 3 separate events in 2017  . Nephrolithiasis   . Panic attacks   . Spinal stenosis   . Temporomandibular joint pain dysfunction syndrome   . Umbilical hernia   . Vitamin D deficiency   . Wegener's granulomatosis with renal involvement [facility])     Past Surgical History:  Procedure Laterality Date  . CARDIAC CATHETERIZATION    . CHOLECYSTECTOMY    . COLONOSCOPY N/A 06/03/2015   Procedure: COLONOSCOPY;  Surgeon: Lucilla Lame, MD;  Location: [place];  Service: Gastroenterology;  Laterality: N/A;  . CORONARY ARTERY BYPASS GRAFT  2004   CABG x 3  . EYE SURGERY    . HERNIA REPAIR  x 2  . HIP SURGERY     right hip replacement  . INTRAOCULAR LENS INSERTION    . KIDNEY SURGERY     cancer  . NOSE SURGERY    . POLYPECTOMY  06/03/2015   Procedure: POLYPECTOMY;  Surgeon: Lucilla Lame, MD;  Location: [place];  Service: Gastroenterology;;  . PROSTATE SURGERY    . TOTAL HIP ARTHROPLASTY Left 12/19/2016   Procedure: LEFT TOTAL HIP ARTHROPLASTY;  Surgeon: Garald Balding, MD;  Location: [place];  Service: Orthopedics;  Laterality: Left;       Inpatient Medications: Scheduled Meds: . atorvastatin  40 mg Oral Daily  . atropine      . cholecalciferol  2,000 Units Oral Daily  . diclofenac sodium  4 g Topical QID  . ezetimibe  10 mg Oral Daily  . FLUoxetine  10 mg Oral  Daily  . fluticasone  2 spray Each Nare QHS  . gabapentin  200 mg Oral BID AC & HS  . niacin  500 mg Oral Daily  . pantoprazole  40 mg Oral Daily  . tamsulosin  0.4 mg Oral QHS   Continuous Infusions: . heparin 1,250 Units/hr (04/05/18 0600)   PRN Meds: acetaminophen, atropine, ondansetron (ZOFRAN) IV  Allergies:    Allergies  Allergen Reactions  . Clonazepam Other (See Comments)    Patient is confused for up to 12+ hours after administration. Patient is confused for up to 12+ hours after administration.    Social History:   Social History   Socioeconomic History  . Marital status: Married    Spouse name: Not on file  . Number of children: Not on file  . Years of education: Not on file  . Highest education level: Not on file  Occupational History  . Not on file  Social Needs  . Financial resource strain: Not on file  . Food insecurity:    Worry: Not on file    Inability: Not on file  . Transportation needs:    Medical: Not on file    Non-medical: Not on file  Tobacco Use  . Smoking status: Former Smoker    Packs/day: 0.50    Years: 25.00    Pack years: 12.50    Types: Cigarettes    Last attempt to quit: 12/29/1974    Years since quitting: 43.2  . Smokeless tobacco: Never Used  Substance and Sexual Activity  . Alcohol use: No    Alcohol/week: 1.2 oz    Types: 2 Cans of beer per week    Comment: social  . Drug use: No  . Sexual activity: Not on file  Lifestyle  . Physical activity:    Days per week: Not on file    Minutes per session: Not on file  . Stress: Not on file  Relationships  . Social connections:    Talks on phone: Not on file    Gets together: Not on file    Attends religious service: Not on file    Active member of club or organization: Not on file    Attends meetings of clubs or organizations: Not on file    Relationship status: Not on file  . Intimate partner violence:    Fear of current or ex partner: Not on file    Emotionally  abused: Not on file    Physically abused: Not on file    Forced sexual activity: Not on file  Other Topics Concern  . Not on file  Social History  Narrative  . Not on file    Family History:   Family History  Problem Relation Age of Onset  . Heart disease Mother   . Heart disease Sister   . Heart disease Brother      ROS:  Please see the history of present illness.  All other ROS reviewed and negative.     Physical Exam/Data:   Vitals:   04/05/18 0300 04/05/18 0400 04/05/18 0500 04/05/18 0600  BP: (!) 126/54 135/67 (!) 142/46 (!) 137/59  Pulse: (!) 31 (!) 33 (!) 33 (!) 31  Resp: 14 14 14 17   Temp:   97.7 F (36.5 C)   TempSrc:   Oral   SpO2: 99% 95% 97% 97%  Weight:  223 lb 1.7 oz (101.2 kg)    Height:        Intake/Output Summary (Last 24 hours) at 04/05/2018 0710 Last data filed at 04/05/2018 0600 Gross per 24 hour  Intake 96.04 ml  Output 550 ml  Net -453.96 ml   Filed Weights   04/04/18 1900 04/04/18 2000 04/05/18 0400  Weight: 222 lb 10.6 oz (101 kg) 223 lb 1.7 oz (101.2 kg) 223 lb 1.7 oz (101.2 kg)   Body mass index is 34.94 kg/m.  General:  Well nourished, well developed, in no acute distress HEENT: normal Lymph: no adenopathy Neck: no JVD Endocrine:  No thryomegaly Vascular: No carotid bruits; FA pulses 2+ bilaterally without bruits  Cardiac:  iRRR; bradycardic, 1-2/6 SM, no gallops or rubs Lungs:  CTA b/l, no wheezing, rhonchi or rales  Abd: soft, nontender Ext: no edema Musculoskeletal:  No deformities Skin: warm and dry  Neuro:  No gross focal abnormalities noted, hard of hearing Psych:  Normal affect   EKG:  The EKG was personally reviewed and demonstrates:   Yesterday's, AFib, V rate 27-32bpm, LBBB not new Telemetry:  Telemetry was personally reviewed and demonstrates:   AFib 30's, overnight dipping to 20's, while we are talking 30's-40's  Relevant CV Studies:  06/13/18: TTE Study Conclusions - Left ventricle: The cavity size was  normal. Systolic function was   mildly to moderately reduced. The estimated ejection fraction was   in the range of 40% to 45%. Hypokinesis of the anteroseptal   myocardium. Hypokinesis of the anterior myocardium. The study is   not technically sufficient to allow evaluation of LV diastolic   function. - Aortic valve: There was very mild stenosis. Mean gradient (S): 11   mm Hg. Peak gradient (S): 23 mm Hg. Valve area (VTI): 1.35 cm^2. - Mitral valve: There was mild regurgitation. - Left atrium: The atrium was moderately dilated. - Pulmonary arteries: Systolic pressure was within the normal   rage. Impressions: - Challenging image quality.  2015: LVEF 45-50%  Laboratory Data:  Chemistry Recent Labs  Lab 04/04/18 2045  NA 134*  K 4.5  CL 102  CO2 23  GLUCOSE 93  BUN 36*  CREATININE 1.88*  CALCIUM 8.7*  GFRNONAA 34*  GFRAA 39*  ANIONGAP 9    Recent Labs  Lab 04/04/18 2045  PROT 6.4*  ALBUMIN 3.4*  AST 29  ALT 35  ALKPHOS 46  BILITOT 1.0   Hematology Recent Labs  Lab 04/04/18 2045  WBC 8.1  RBC 3.79*  HGB 12.2*  HCT 36.8*  MCV 97.1  MCH 32.2  MCHC 33.2  RDW 14.6  PLT 216   Cardiac EnzymesNo results for input(s): TROPONINI in the last 168 hours. No results for input(s): TROPIPOC in the last  168 hours.  BNPNo results for input(s): BNP, PROBNP in the last 168 hours.  DDimer No results for input(s): DDIMER in the last 168 hours.  Radiology/Studies:  No results found.  Assessment and Plan:   1. Symptomatic bradycardia     Last dose of Toprol was 0800 yesterday, 5 1/2 lives are 25-35 hours .  He requires beta blocker therapy long term for his chronic CHF.     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  BP has been stable, at rest patient denies symptoms, mentating well.  Dr. Rayann Heman to see  2. Permanent AFib     CHA2DS2Vasc is at least 3, on warfarin out pt  3. CAD     No typical sounding symptoms of angina     He has been having an unusual indigestion in this past week ?     For questions or updates, please contact [place] Please consult [email or web address] for contact info under Cardiology/STEMI.   Signed, Baldwin Jamaica, PA-C  04/05/2018 7:10 AM  I have seen, examined the patient, and reviewed the above assessment and plan.  Changes to above are made where necessary.  On exam bradycardic irregular rhythm.  Pt with permanent afib, sever biatrial enlargement, and complete heart block.  He requires beta blocker therapy long term for his CAD/ ischemic CM (EF 45%).  I would therefore recommend biventricular pacemaker implantation at this time.  Risks, benefits, alternatives to BiV  pacemaker implantation were discussed in detail with the patient today. The patient understands that the risks include but are not limited to bleeding, infection, pneumothorax, perforation, tamponade, vascular damage, renal failure, MI, stroke, death,  and lead dislodgement and wishes to proceed. We will therefore schedule the procedure at the next available time.  He is scheduled for this afternoon.  Heparin is discontinued.   Co Sign: [name], MD 04/05/2018 2:24 PM

## 2018-04-05 NOTE — Interval H&P Note (Signed)
History and Physical Interval Note:  04/05/2018 2:26 PM  Jesus Hendrix  has presented today for surgery, with the diagnosis of bradycardia  The various methods of treatment have been discussed with the patient and family. After consideration of risks, benefits and other options for treatment, the patient has consented to  Procedure(s): PACEMAKER IMPLANT (N/A) (biventricular) as a surgical intervention .  The patient's history has been reviewed, patient examined, no change in status, stable for surgery.  I have reviewed the patient's chart and labs.  Questions were answered to the patient's satisfaction.     Jesus Hendrix

## 2018-04-06 ENCOUNTER — Encounter (HOSPITAL_COMMUNITY): Payer: Self-pay | Admitting: Physician Assistant

## 2018-04-06 ENCOUNTER — Inpatient Hospital Stay (HOSPITAL_COMMUNITY): Payer: Medicare Other

## 2018-04-06 LAB — CBC
HCT: 35.2 % — ABNORMAL LOW (ref 39.0–52.0)
Hemoglobin: 11.6 g/dL — ABNORMAL LOW (ref 13.0–17.0)
MCH: 31.8 pg (ref 26.0–34.0)
MCHC: 33 g/dL (ref 30.0–36.0)
MCV: 96.4 fL (ref 78.0–100.0)
Platelets: 218 10*3/uL (ref 150–400)
RBC: 3.65 MIL/uL — ABNORMAL LOW (ref 4.22–5.81)
RDW: 15 % (ref 11.5–15.5)
WBC: 6.1 10*3/uL (ref 4.0–10.5)

## 2018-04-06 LAB — BASIC METABOLIC PANEL
Anion gap: 7 (ref 5–15)
BUN: 28 mg/dL — ABNORMAL HIGH (ref 6–20)
CALCIUM: 8.7 mg/dL — AB (ref 8.9–10.3)
CO2: 23 mmol/L (ref 22–32)
CREATININE: 1.62 mg/dL — AB (ref 0.61–1.24)
Chloride: 106 mmol/L (ref 101–111)
GFR calc non Af Amer: 40 mL/min — ABNORMAL LOW (ref >60)
GFR, EST AFRICAN AMERICAN: 47 mL/min — AB (ref >60)
GLUCOSE: 140 mg/dL — AB (ref 65–99)
Potassium: 4.5 mmol/L (ref 3.5–5.1)
Sodium: 136 mmol/L (ref 135–145)

## 2018-04-06 LAB — PROTIME-INR
INR: 2.02
Prothrombin Time: 22.7 seconds — ABNORMAL HIGH (ref 11.4–15.2)

## 2018-04-06 NOTE — Discharge Summary (Signed)
Discharge Summary    [name],  MRN: [id number], DOB/AGE: 1946/09/08 72 y.o.  Admit date: 04/04/2018 Discharge date: 04/06/2018  Primary Care Provider: Burnard Hawthorne Primary Cardiologist: Dr. Rockey Situ  Discharge Diagnoses    Principal Problem:   Complete heart block [facility]) Active Problems:   CAD (coronary artery disease)   Near syncope   Permanent atrial fibrillation (HCC)   Shortness of breath   Severe sinus bradycardia  Allergies Allergies  Allergen Reactions  . Clonazepam Other (See Comments)    Patient is confused for up to 12+ hours after administration. Patient is confused for up to 12+ hours after administration.   Diagnostic Studies/Procedures   PACEMAKER IMPLANT 04/05/18  RA/RV Lead Placement: The left axillary vein was cannulated with fluoroscopic visualization.  No contrast was required for this endeavor.  Through the left axillary vein, a St. Jude Medical Tendril MRI, model [id number] (serial # [id number]) right ventricular lead was advanced with fluoroscopic visualization into the right ventricular apex position.  Right ventricular lead R-wave measured 8 mV with impedance of 581 ohms and a threshold of 0.7 volts at 0.5 milliseconds.  Because of permanent atrial fibrillation, an atrial lead was not placed today.  LV Lead Placement: A Medtronic MB-2 guide was advanced through the left axillary vein into the low lateral right atrium.   A Bard curved Damato catheter was introduced through the MB-2 guide and used to cannulate the coronary sinus. The right atrium was severely enlarged and therefore the MD2 guide was not sufficient in cannulating the coronary sinus.  The coronary sinus was cannulated with an extended hook guide and a Wholey wire.   A selective coronary sinus venogram was performed by hand injection of nonionic contrast.  This demonstrated a very large CS body.  This was markedly displaced and in an anterior and very lateral position.   Only a single posterolateral branch was observed.  This was a very large branch. A Whisper CSJ wire was introduced through the guide and advanced into the posterolateral branch.  A Marissa [id number] (serial number [id number]) lead was advanced through the guide into the posterolateral branch.   In this location, the left ventricular lead R-waves measured  8 mV with impedance of 893 ohms and a threshold of 0.8 volt at 0.5  milliseconds in the unipolar tip to can configuration with no diaphragmatic  stimulation observed when pacing at 10 volts output.  The  guide was  therefore removed.   Both leads were secured to the pectoralis  fascia using #2 silk suture over the suture sleeves.  The pocket then  irrigated with copious gentamicin solution.  The leads were then  connected to a [place] MP RF model CD [id number] (serial  Number [id number]) biventricular pacemaker.  The pacemaker was placed into the  pocket.  The pocket was then closed in 2 layers with 2.0 Vicryl suture  for the subcutaneous and subcuticular layers. EBL<16ml.  Steri-Strips and a  sterile dressing were then applied.  There were no early apparhent complications.    During this procedure the patient is administered a total of Versed 1 mg to achieve and maintain moderate conscious sedation.  The patient's heart rate, blood pressure, and oxygen saturation are monitored continuously during the procedure. The period of conscious sedation is 57 minutes, of which I was present face-to-face 100% of this time.  Permanent Pacemaker Indication: Documented non-reversible symptomatic bradycardia due to  second degree and/or third degree atrioventricular block.   CONCLUSIONS:   1. Nonischemic cardiomyopathy with permanent atrial fibrillation and complete heart block  2. Successful biventricular pacemaker implantation.   3. No early apparent complications.   History of Present Illness     Jesus Hendrix is a 72  y.o. male with a hx of CAD (remote CABG), CBP, HLD, renal ca s/p ablation tx, CRI, persistent AFib, Wegener's granulomatosis with some degree of dizziness associated with it and chronic systolic CHF,  who is admitted from clinic from symptomatic bradycardia.   Jesus Hendrix reports for about a week he has not had any kind of energy, getting winded and having to stop with minimal exertion, "I get tired just walking across the room".  He is still working full time in his Architect business and tells me that at his baseline he has very good exertional tolerance without limitations.  He has been more dizzy then usual but denies any near syncope or syncope.  No CP but reports an unusual heartburn that has been fairly persistent in this same time frame of about a week.  This does not change with exertion, his "usual indigestion pill" has not helped as it usually does.  He saw Dr. Rockey Situ 04/04/18  noted with marked bradycardia and sent to the hospital.   Hospital Course     Consultants: EP  The patient was admitted directly. Held coumadin and stared on IV heparin. Echo showed persistent low LVEF of 45% to 50%. Hypokinesis and scarring of the inferolateral myocardium; consistent with infarction in the distribution of the right coronary or left circumflex coronary artery. He underwent successful biventricular St. Jude pacemaker implantation. No overnight complications. CXR looks good.  Advised to take 50% (half) dose of current coumadin for 4 days and then back to regular dose. Will continue BB at current dose.   The patient has been seen by Dr. Caryl Comes  today and deemed ready for discharge home. All follow-up appointments have been scheduled. Discharge medications are listed below.   Discharge Vitals Blood pressure (!) 131/54, pulse 65, temperature 97.8 F (36.6 C), temperature source Oral, resp. rate (!) 23, height 5\' 7"  (1.702 m), weight 231 lb 0.7 oz (104.8 kg), SpO2 95 %.  Filed Weights   04/04/18 2000  04/05/18 0400 04/06/18 0239  Weight: 223 lb 1.7 oz (101.2 kg) 223 lb 1.7 oz (101.2 kg) 231 lb 0.7 oz (104.8 kg)    Labs & Radiologic Studies    CBC Recent Labs    04/05/18 0653 04/06/18 0402  WBC 5.9 6.1  HGB 11.9* 11.6*  HCT 36.1* 35.2*  MCV 97.6 96.4  PLT 201 009   Basic Metabolic Panel Recent Labs    04/05/18 0653 04/06/18 0402  NA 137 136  K 4.5 4.5  CL 107 106  CO2 21* 23  GLUCOSE 92 140*  BUN 34* 28*  CREATININE 1.74* 1.62*  CALCIUM 8.7* 8.7*   Liver Function Tests Recent Labs    04/04/18 2045  AST 29  ALT 35  ALKPHOS 46  BILITOT 1.0  PROT 6.4*  ALBUMIN 3.4*   Fasting Lipid Panel Recent Labs    04/05/18 0236  CHOL 132  HDL 29*  LDLCALC 91  TRIG 58  CHOLHDL 4.6   Thyroid Function Tests Recent Labs    04/04/18 2045  TSH 2.386   Dg Chest 2 View  Result Date: 04/06/2018 CLINICAL DATA:  Status post pacemaker placement 04/05/2018. EXAM: CHEST - 2 VIEW COMPARISON:  PA and lateral chest 12/19/2016. FINDINGS: Two lead pacing device is in place with tips of the leads in the right ventricle and left ventricle. Leads are intact. Generator is in the mid left chest. There is mild bibasilar atelectasis. No pneumothorax. The patient is status post CABG. Mild cardiomegaly is seen. Trace pleural effusions noted. No acute bony abnormality. IMPRESSION: New pacemaking device in place with and left ventricle leads in the right ventricle. Negative for pneumothorax. Mild cardiomegaly. Electronically Signed   By: Inge Rise M.D.   On: 04/06/2018 08:59   Disposition   Pt is being discharged home today in good condition.  Follow-up Plans & Appointments   Follow-up Information    [facility] Follow up on 04/17/2018.   Specialty:  Cardiology Why:  4:00PM, wound check visit Contact information: [address] [place]       [name], MD Follow up on 07/15/2018.   Specialty:   Cardiology Why:  9:45AM Contact information: [place] [address] [phone]          Discharge Instructions    Call MD for:  extreme fatigue   Complete by:  As directed    Call MD for:  persistant dizziness or light-headedness   Complete by:  As directed    Call MD for:  redness, tenderness, or signs of infection (pain, swelling, redness, odor or green/yellow discharge around incision site)   Complete by:  As directed    Call MD for:  severe uncontrolled pain   Complete by:  As directed    Diet - low sodium heart healthy   Complete by:  As directed    Diet - low sodium heart healthy   Complete by:  As directed    Discharge instructions   Complete by:  As directed    Please only take 50% (half) dose of current coumadin for 4 days and then back to regular dose.   Increase activity slowly   Complete by:  As directed    Increase activity slowly   Complete by:  As directed       Discharge Medications   Allergies as of 04/06/2018      Reactions   Clonazepam Other (See Comments)   Patient is confused for up to 12+ hours after administration. Patient is confused for up to 12+ hours after administration.      Medication List    TAKE these medications   acetaminophen 325 MG tablet Commonly known as:  TYLENOL Take 650 mg by mouth 3 (three) times daily as needed for moderate pain.   atorvastatin 40 MG tablet Commonly known as:  LIPITOR Take 1 tablet (40 mg total) by mouth daily. What changed:  how much to take   diclofenac sodium 1 % Gel Commonly known as:  VOLTAREN Apply 4 g 4 (four) times daily topically.   ezetimibe 10 MG tablet Commonly known as:  ZETIA Take 1 tablet (10 mg total) by mouth daily.   FLUoxetine 10 MG capsule Commonly known as:  PROZAC Take 1 capsule (10 mg total) daily by mouth.   fluticasone 50 MCG/ACT nasal spray Commonly known as:  FLONASE Place 2 sprays into both nostrils daily.   furosemide 20 MG  tablet Commonly known as:  LASIX Take 1 tablet (20 mg total) by mouth 2 (two) times daily as needed for fluid or edema. What changed:    how much to take  when to take this   gabapentin 100 MG capsule Commonly known as:  NEURONTIN Take 2 capsules (200 mg total) by mouth 2 (two) times daily at 8 am and 10 pm. What changed:    how much to take  when to take this   losartan 50 MG tablet Commonly known as:  COZAAR Take 0.5 tablets (25 mg total) by mouth at bedtime. What changed:  when to take this   metoprolol succinate 25 MG 24 hr tablet Commonly known as:  TOPROL-XL Take 1 tablet (25 mg total) by mouth daily.   pantoprazole 40 MG tablet Commonly known as:  PROTONIX Take 40 mg by mouth daily.   tamsulosin 0.4 MG Caps capsule Commonly known as:  FLOMAX Take 0.4 mg by mouth daily after breakfast.   Vitamin D 2000 units Caps Take 2,000 Units by mouth at bedtime.   warfarin 5 MG tablet Commonly known as:  COUMADIN Take as directed. If you are unsure how to take this medication, talk to your nurse or doctor. Original instructions:  Take as directed by [facility] What changed:    how much to take  how to take this  when to take this  additional instructions        Outstanding Labs/Studies   None. INR check as schedule.   Duration of Discharge Encounter   Greater than 30 minutes including physician time.  Signed, Crista Luria Evaleigh Mccamy PA-C    04/06/2018, 11:44 AM

## 2018-04-06 NOTE — Progress Notes (Signed)
Patient Name: Jesus Hendrix    Patient Profile:   Jesus Hendrix is a 72 y.o. male with a hx of CAD (remote CABG), CBP, HLD, renal ca s/p ablation tx, CRI, persistent AFib, Wegener's granulomatosis with some degree of dizziness       SUBJECTIVE adimtted with high grade heart block in setting of permanent atrial fibrillation and underwent CRT yesterday   Without complaint and feels much better   Pressure dressing applied at implant  Past Medical History:  Diagnosis Date  . Anxiety   . Aortic stenosis    a. mild on echo 04/2014  . Atrial fibrillation ([place]) 06/06/2016   a. newly diagnosed 06/06/2016; b. CHADS2VASc => 5 (CHF, HTN, age x 1, DM, vascular disease)  . Choledocholithiasis   . Chronic kidney disease   . Chronic systolic CHF (congestive heart failure) ([place])    a. echo 04/2014: EF 45-50%, nl LV dia fxn, mild AS, LA mildly dilated, PASP nl  . COPD (chronic obstructive pulmonary disease) ([place])    Not on home oxygen  . Coronary artery disease    a. status post CABG in 2007; b. nuc stress test 2014: small region of mild ischemia in the inferior territory, EF 61%, no ischemia, scan similar to prior scan in 2009  . Exogenous obesity   . GERD (gastroesophageal reflux disease)   . Gout   . Hiatal hernia   . Hip pain, chronic 08/03/2016   Waiting to have hip replacement pending NM stress test results per patient.  . History of colonic diverticulitis   . History of renal cell carcinoma    a. s/p laser ablation   . Hyperlipidemia   . Hypertension   . Near syncope    a. 3 separate events in 2017  . Nephrolithiasis   . Panic attacks   . Spinal stenosis   . Temporomandibular joint pain dysfunction syndrome   . Umbilical hernia   . Vitamin D deficiency   . Wegener's granulomatosis with renal involvement ([place])     Scheduled Meds:  Scheduled Meds: . atorvastatin  40 mg Oral Daily  . cholecalciferol  2,000 Units Oral Daily  . diclofenac sodium  4 g Topical QID    . ezetimibe  10 mg Oral Daily  . FLUoxetine  10 mg Oral Daily  . fluticasone  2 spray Each Nare QHS  . gabapentin  200 mg Oral BID AC & HS  . niacin  500 mg Oral Daily  . pantoprazole  40 mg Oral Daily  . sodium chloride flush  3 mL Intravenous Q12H  . tamsulosin  0.4 mg Oral QHS   Continuous Infusions: . sodium chloride     sodium chloride, acetaminophen, HYDROcodone-acetaminophen, ondansetron (ZOFRAN) IV, sodium chloride flush    PHYSICAL EXAM Vitals:   04/05/18 2000 04/05/18 2100 04/06/18 0239 04/06/18 0711  BP: (!) 153/60 (!) 139/46 (!) 129/40 (!) 131/54  Pulse: 63 60 62 65  Resp: 20 (!) 21 17 (!) 23  Temp:   98.1 F (36.7 C) 97.8 F (36.6 C)  TempSrc: Oral  Oral Oral  SpO2: 100% 96% 95% 95%  Weight:   231 lb 0.7 oz (104.8 kg)   Height:       Well developed and nourished in no acute distress HENT normal Neck supple with JVP-flat Clear pressuire dressing in place Regular rate and rhythm, no murmurs or gallops Abd-soft with active BS No Clubbing cyanosis edema Skin-warm and dry A &  Oriented  Grossly normal sensory and motor function   TELEMETRY: Reviewed personnally pt  V pacing  ECG personally reviewed  QRS neg lead Vq and pos lead 1    Intake/Output Summary (Last 24 hours) at 04/06/2018 1035 Last data filed at 04/05/2018 1400 Gross per 24 hour  Intake 88.35 ml  Output -  Net 88.35 ml    LABS: Basic Metabolic Panel: Recent Labs  Lab 04/04/18 2045 04/05/18 0653 04/06/18 0402  NA 134* 137 136  K 4.5 4.5 4.5  CL 102 107 106  CO2 23 21* 23  GLUCOSE 93 92 140*  BUN 36* 34* 28*  CREATININE 1.88* 1.74* 1.62*  CALCIUM 8.7* 8.7* 8.7*   Cardiac Enzymes: No results for input(s): CKTOTAL, CKMB, CKMBINDEX, TROPONINI in the last 72 hours. CBC: Recent Labs  Lab 04/04/18 2045 04/05/18 0653 04/06/18 0402  WBC 8.1 5.9 6.1  HGB 12.2* 11.9* 11.6*  HCT 36.8* 36.1* 35.2*  MCV 97.1 97.6 96.4  PLT 216 201 218   PROTIME: Recent Labs    04/04/18 2045  04/05/18 0653 04/06/18 0402  LABPROT 23.1* 23.2* 22.7*  INR 2.07 2.08 2.02   Liver Function Tests: Recent Labs    04/04/18 2045  AST 29  ALT 35  ALKPHOS 46  BILITOT 1.0  PROT 6.4*  ALBUMIN 3.4*   No results for input(s): LIPASE, AMYLASE in the last 72 hours. BNP: BNP (last 3 results) No results for input(s): BNP in the last 8760 hours.  ProBNP (last 3 results) No results for input(s): PROBNP in the last 8760 hours.  D-Dimer: No results for input(s): DDIMER in the last 72 hours. Hemoglobin A1C: No results for input(s): HGBA1C in the last 72 hours. Fasting Lipid Panel: Recent Labs    04/05/18 0236  CHOL 132  HDL 29*  LDLCALC 91  TRIG 58  CHOLHDL 4.6   Thyroid Function Tests: Recent Labs    04/04/18 2045  TSH 2.386   Anemia Panel: No results for input(s): VITAMINB12, FOLATE, FERRITIN, TIBC, IRON, RETICCTPCT in the last 72 hours.   CXR stable lead position with bail out postion  Device Interrogation:* normal  ASSESSMENT AND PLAN:  Principal Problem:   Complete heart block (HCC) Active Problems:   CAD (coronary artery disease)   Near syncope   Permanent atrial fibrillation (HCC)   Shortness of breath   Severe sinus bradycardia  For discharge Instructions given Will see in [place] on tues do evaluate pocket  Resume coumadin  But dose at 50 % for 4 days    Signed, Virl Axe MD  04/06/2018

## 2018-04-08 ENCOUNTER — Encounter (HOSPITAL_COMMUNITY): Payer: Self-pay | Admitting: Internal Medicine

## 2018-04-08 MED FILL — Lidocaine HCl Local Inj 1%: INTRAMUSCULAR | Qty: 80 | Status: AC

## 2018-04-08 MED FILL — Fentanyl Citrate Preservative Free (PF) Inj 100 MCG/2ML: INTRAMUSCULAR | Qty: 2 | Status: AC

## 2018-04-09 ENCOUNTER — Ambulatory Visit (INDEPENDENT_AMBULATORY_CARE_PROVIDER_SITE_OTHER): Payer: Self-pay | Admitting: Internal Medicine

## 2018-04-09 ENCOUNTER — Encounter: Payer: Self-pay | Admitting: Internal Medicine

## 2018-04-09 VITALS — BP 126/66 | HR 59 | Ht 67.0 in | Wt 226.0 lb

## 2018-04-09 DIAGNOSIS — Z950 Presence of cardiac pacemaker: Secondary | ICD-10-CM

## 2018-04-09 NOTE — Progress Notes (Signed)
A 30 send a text to     Patient Care Team: Burnard Hawthorne, FNP as PCP - General (Family Medicine) Minna Merritts, MD as Consulting Physician (Cardiology)   HPI  Jesus Hendrix is a 72 y.o. male Seen for a wound check having undergone CRT P last week by Dr Greggory Brandy   He has permanent AFib  He has hx of CAD (remote CABG), CBP, HLD, renal ca s/p ablation tx, CRI Wegener's granulomatosis    DATE TEST EF   2015 Echo   40-45 %   6/19 Echo   40.45 % Ant Hypokinesis        Here for wound check because of concerns about pocket bleeding  Past Medical History:  Diagnosis Date  . Anxiety   . Aortic stenosis    a. mild on echo 04/2014  . Atrial fibrillation ([place]) 06/06/2016   a. newly diagnosed 06/06/2016; b. CHADS2VASc => 5 (CHF, HTN, age x 1, DM, vascular disease)  . Choledocholithiasis   . Chronic kidney disease   . Chronic systolic CHF (congestive heart failure) ([place])    a. echo 04/2014: EF 45-50%, nl LV dia fxn, mild AS, LA mildly dilated, PASP nl  . COPD (chronic obstructive pulmonary disease) ([place])    Not on home oxygen  . Coronary artery disease    a. status post CABG in 2007; b. nuc stress test 2014: small region of mild ischemia in the inferior territory, EF 61%, no ischemia, scan similar to prior scan in 2009  . Exogenous obesity   . GERD (gastroesophageal reflux disease)   . Gout   . Hiatal hernia   . Hip pain, chronic 08/03/2016   Waiting to have hip replacement pending NM stress test results per patient.  . History of colonic diverticulitis   . History of renal cell carcinoma    a. s/p laser ablation   . Hyperlipidemia   . Hypertension   . Near syncope    a. 3 separate events in 2017  . Nephrolithiasis   . Panic attacks   . Spinal stenosis   . Symptomatic bradycardia    s/p [employer] Quarda Allure MP RF model [place] [id number] (serial  Number [id number]) biventricular pacemaker. 04/05/18  . Temporomandibular joint pain dysfunction syndrome   . Umbilical hernia     . Vitamin D deficiency   . Wegener's granulomatosis with renal involvement [facility])     Past Surgical History:  Procedure Laterality Date  . CARDIAC CATHETERIZATION    . CHOLECYSTECTOMY    . COLONOSCOPY N/A 06/03/2015   Procedure: COLONOSCOPY;  Surgeon: Lucilla Lame, MD;  Location: [place];  Service: Gastroenterology;  Laterality: N/A;  . CORONARY ARTERY BYPASS GRAFT  2004   CABG x 3  . EYE SURGERY    . HERNIA REPAIR     x 2  . HIP SURGERY     right hip replacement  . INTRAOCULAR LENS INSERTION    . KIDNEY SURGERY     cancer  . NOSE SURGERY    . PACEMAKER IMPLANT N/A 04/05/2018   Procedure: PACEMAKER IMPLANT;  Surgeon: [name], MD;  Location: [place] [facility];  Service: Cardiovascular;  Laterality: N/A;  . POLYPECTOMY  06/03/2015   Procedure: POLYPECTOMY;  Surgeon: Lucilla Lame, MD;  Location: [place];  Service: Gastroenterology;;  . PROSTATE SURGERY    . TOTAL HIP ARTHROPLASTY Left 12/19/2016   Procedure: LEFT TOTAL HIP ARTHROPLASTY;  Surgeon: Garald Balding, MD;  Location:  [place];  Service: Orthopedics;  Laterality: Left;    Current Meds  Medication Sig  . acetaminophen (TYLENOL) 325 MG tablet Take 650 mg by mouth 3 (three) times daily as needed for moderate pain.  Marland Kitchen atorvastatin (LIPITOR) 20 MG tablet Take 20 mg by mouth daily.  . Cholecalciferol (VITAMIN D) 2000 units CAPS Take 2,000 Units by mouth at bedtime.   . diclofenac sodium (VOLTAREN) 1 % GEL Apply 4 g 4 (four) times daily topically.  Marland Kitchen ezetimibe (ZETIA) 10 MG tablet Take 1 tablet (10 mg total) by mouth daily.  Marland Kitchen FLUoxetine (PROZAC) 10 MG capsule Take 1 capsule (10 mg total) daily by mouth.  . fluticasone (FLONASE) 50 MCG/ACT nasal spray Place 2 sprays into both nostrils daily.   . furosemide (LASIX) 20 MG tablet Take 1 tablet (20 mg total) by mouth 2 (two) times daily as needed for fluid or edema. (Patient taking differently: Take 40 mg by mouth daily. )  . gabapentin (NEURONTIN) 100 MG  capsule Take 100 mg by mouth daily.  Marland Kitchen losartan (COZAAR) 50 MG tablet Take 0.5 tablets (25 mg total) by mouth at bedtime. (Patient taking differently: Take 25 mg by mouth daily. )  . metoprolol succinate (TOPROL-XL) 25 MG 24 hr tablet Take 1 tablet (25 mg total) by mouth daily.  . pantoprazole (PROTONIX) 40 MG tablet Take 40 mg by mouth daily.  . tamsulosin (FLOMAX) 0.4 MG CAPS capsule Take 0.4 mg by mouth daily after breakfast.   . warfarin (COUMADIN) 5 MG tablet Take as directed by [facility] (Patient taking differently: Take 5 mg by mouth daily. Take as directed by [facility])    Allergies  Allergen Reactions  . Clonazepam Other (See Comments)    Patient is confused for up to 12+ hours after administration. Patient is confused for up to 12+ hours after administration.      Review of Systems negative except from HPI and PMH  Physical Exam BP 126/66 (BP Location: Right Arm, Patient Position: Sitting, Cuff Size: Normal)   Pulse (!) 59   Ht 5\' 7"  (1.702 m)   Wt 226 lb (102.5 kg)   BMI 35.40 kg/m    Pocket without hematoma or ecchymosis   Assessment and  Plan   Stable pocket   Current medicines are reviewed at length with the patient today .  The patient does not  have concerns regarding medicines.

## 2018-04-16 ENCOUNTER — Encounter: Payer: Self-pay | Admitting: Internal Medicine

## 2018-04-16 ENCOUNTER — Ambulatory Visit (INDEPENDENT_AMBULATORY_CARE_PROVIDER_SITE_OTHER): Payer: Medicare Other | Admitting: Internal Medicine

## 2018-04-16 VITALS — BP 120/62 | HR 65 | Ht 67.0 in | Wt 223.0 lb

## 2018-04-16 DIAGNOSIS — I442 Atrioventricular block, complete: Secondary | ICD-10-CM

## 2018-04-16 DIAGNOSIS — I482 Chronic atrial fibrillation: Secondary | ICD-10-CM

## 2018-04-16 DIAGNOSIS — I4821 Permanent atrial fibrillation: Secondary | ICD-10-CM

## 2018-04-16 DIAGNOSIS — Z950 Presence of cardiac pacemaker: Secondary | ICD-10-CM

## 2018-04-16 NOTE — Progress Notes (Signed)
Wound check  >> pocket looks good Steri strips removed  Adhesive remover given to patient  Instructions give  Will see in 3 months

## 2018-04-17 ENCOUNTER — Ambulatory Visit: Payer: Medicare Other

## 2018-04-29 ENCOUNTER — Ambulatory Visit (INDEPENDENT_AMBULATORY_CARE_PROVIDER_SITE_OTHER): Payer: Medicare Other

## 2018-04-29 DIAGNOSIS — I4891 Unspecified atrial fibrillation: Secondary | ICD-10-CM

## 2018-04-29 DIAGNOSIS — I482 Chronic atrial fibrillation: Secondary | ICD-10-CM

## 2018-04-29 DIAGNOSIS — Z7901 Long term (current) use of anticoagulants: Secondary | ICD-10-CM

## 2018-04-29 DIAGNOSIS — I4821 Permanent atrial fibrillation: Secondary | ICD-10-CM

## 2018-04-29 LAB — POCT INR: INR: 2.5

## 2018-04-29 NOTE — Patient Instructions (Signed)
Please continue same dosage of 1 tablet daily except 1.5 tablets on Sundays and Wednesdays, and Fridays. If Dr. Rockey Situ oks you to stop coumadin, we will you set you up another appt to make sure your INR is <2.0.

## 2018-05-03 LAB — CUP PACEART INCLINIC DEVICE CHECK
Date Time Interrogation Session: 20190517090042
Implantable Lead Implant Date: 20190419
Implantable Lead Implant Date: 20190419
Implantable Pulse Generator Implant Date: 20190419
MDC IDC LEAD LOCATION: 753858
MDC IDC LEAD LOCATION: 753860
MDC IDC PG SERIAL: 9017264
Pulse Gen Model: 3262

## 2018-05-04 NOTE — Progress Notes (Signed)
[name], male   DOB: Apr 07, 1946, 72 y.o.   MRN: [id number] Cardiology Office Note  Date:  05/06/2018   ID:  Jesus Hendrix, DOB [birth year], MRN [id number]  PCP:  Patient, No Pcp Per   Chief Complaint  Patient presents with  . Other    1 month follow up. Patient would like to switch from Coumadin to Eliquis. Meds reviewed verbally with patient.     HPI:  Jesus Hendrix is a 72 year old gentleman with  Persistent atrial fibrillation chronic back pain and sciatica down his left leg,  coronary artery disease and bypass surgery in 2007,  hyperlipidemia,  obesity,  kidney cancer,  status post laser ablation at [facility], ANCA associated vasculitis, Wegener's granulomatosis,  previous history of dizziness which he attributes to his Wegener's followed by [facility] nephrology who presents for routine followup of his coronary artery disease, atrial fibrillation  Last clinic visit,  heart rate in the 30s, transferred to [facility],  Pacer placed for complete heart block Has recovered well Going to work today, still working on sewage work, 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  Echocardiogram May 2015 with mild aortic valve stenosis, results were discussed with him  cardiac catheterization in January 2007 details 75-80% long lesion in the LAD at the ostium, 95% lesion in the PDA. He was referred to bypass surgery. 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 Defect worse in the inferoapical region. No ischemia. Ejection fraction 55%. Exercised for 7 METS  Previous history of myalgias on high-dose cholesterol medication. Tolerating Lipitor 30 mg daily  PMH:   has a past medical history of Anxiety, Aortic stenosis, Atrial fibrillation ([place]) (06/06/2016), Choledocholithiasis, Chronic kidney disease, Chronic systolic CHF (congestive heart failure) ([place]), COPD (chronic obstructive pulmonary disease) ([place]), Coronary artery disease, Exogenous obesity, GERD (gastroesophageal reflux disease), Gout, Hiatal hernia, Hip pain, chronic (08/03/2016), History of colonic diverticulitis, History of renal cell carcinoma, Hyperlipidemia, Hypertension, Near syncope, Nephrolithiasis, Panic attacks, Spinal stenosis, Symptomatic bradycardia, Temporomandibular joint pain dysfunction syndrome, Umbilical hernia, Vitamin D deficiency, and Wegener's granulomatosis with renal involvement ([place]).  PSH:    Past Surgical History:  Procedure Laterality Date  . CARDIAC CATHETERIZATION    . CHOLECYSTECTOMY    . COLONOSCOPY N/A 06/03/2015   Procedure: COLONOSCOPY;  Surgeon: Lucilla Lame, MD;  Location: [place];  Service: Gastroenterology;  Laterality: N/A;  . CORONARY ARTERY BYPASS GRAFT  2004   CABG x 3  . EYE SURGERY    . HERNIA  REPAIR     x 2  . HIP SURGERY     right hip replacement  . INTRAOCULAR LENS INSERTION    . KIDNEY SURGERY     cancer  .  NOSE SURGERY    . PACEMAKER IMPLANT N/A 04/05/2018   Procedure: PACEMAKER IMPLANT;  Surgeon: [name], MD;  Location: [place] [facility];  Service: Cardiovascular;  Laterality: N/A;  . POLYPECTOMY  06/03/2015   Procedure: POLYPECTOMY;  Surgeon: Lucilla Lame, MD;  Location: [place];  Service: Gastroenterology;;  . PROSTATE SURGERY    . TOTAL HIP ARTHROPLASTY Left 12/19/2016   Procedure: LEFT TOTAL HIP ARTHROPLASTY;  Surgeon: Garald Balding, MD;  Location: [place];  Service: Orthopedics;  Laterality: Left;    Current Outpatient Medications  Medication Sig Dispense Refill  . atorvastatin (LIPITOR) 20 MG tablet Take 20 mg by mouth daily.    . Cholecalciferol (VITAMIN D) 2000 units CAPS Take 2,000 Units by mouth at bedtime.     . diclofenac sodium (VOLTAREN) 1 % GEL Apply 4 g 4 (four) times daily topically. 1 Tube 3  . ezetimibe (ZETIA) 10 MG tablet Take 1 tablet (10 mg total) by mouth daily. 90 tablet 3  . FLUoxetine (PROZAC) 10 MG capsule Take 1 capsule (10 mg total) daily by mouth. 90 capsule 3  . fluticasone (FLONASE) 50 MCG/ACT nasal spray Place 2 sprays into both nostrils daily.     . furosemide (LASIX) 20 MG tablet Take 1 tablet (20 mg total) by mouth 2 (two) times daily as needed for fluid or edema. (Patient taking differently: Take 40 mg by mouth daily. ) 60 tablet 6  . gabapentin (NEURONTIN) 100 MG capsule Take 100 mg by mouth daily.    Marland Kitchen losartan (COZAAR) 50 MG tablet Take 0.5 tablets (25 mg total) by mouth at bedtime. (Patient taking differently: Take 25 mg by mouth daily. ) 90 tablet 3  . metoprolol succinate (TOPROL-XL) 25 MG 24 hr tablet Take 1 tablet (25 mg total) by mouth daily. 90 tablet 3  . pantoprazole (PROTONIX) 40 MG tablet Take 40 mg by mouth daily.    . tamsulosin (FLOMAX) 0.4 MG CAPS capsule Take 0.4 mg by mouth daily after breakfast.     . apixaban (ELIQUIS) 5 MG TABS tablet Take 1 tablet (5 mg total) by mouth 2 (two) times daily. 60 tablet 11   No current  facility-administered medications for this visit.      Allergies:   Clonazepam   Social History:  The patient  reports that he quit smoking about 43 years ago. His smoking use included cigarettes. He has a 12.50 pack-year smoking history. He has never used smokeless tobacco. He reports that he does not drink alcohol or use drugs.   Family History:   family history includes Heart disease in his brother, mother, and sister.    Review of Systems: Review of Systems  Constitutional: Negative.   Respiratory: Negative.   Cardiovascular: Negative.   Gastrointestinal: Negative.   Musculoskeletal: Positive for back pain and joint pain.  Neurological: Negative.   All other systems reviewed and are negative.    PHYSICAL EXAM: VS:  BP 124/60 (BP Location: Left Arm, Patient Position: Sitting, Cuff Size: Normal)   Pulse 73   Ht 5\' 6"  (1.676 m)   Wt 221 lb (100.2 kg)   BMI 35.67 kg/m  , BMI Body mass index is 35.67 kg/m. No significant change in exam Constitutional:  oriented to person, place, and time.  No distress.  HENT:  Head: Normocephalic and atraumatic.  Eyes:  no discharge. No scleral icterus.  Neck: Normal range of motion. Neck supple. No JVD present.  Cardiovascular: irregularly irregular, normal heart sounds and intact distal pulses. Exam reveals no gallop and no friction rub. No edema No murmur heard. Pulmonary/Chest: Effort normal and breath sounds normal. No stridor. No respiratory distress.  no wheezes.  no rales.  no tenderness.  Abdominal: Soft.  no distension.  no tenderness.  Musculoskeletal: Normal range of motion.  no  tenderness or deformity.  Neurological:  normal muscle tone. Coordination normal. No atrophy Skin: Skin is warm and dry. No rash noted. not diaphoretic.  Psychiatric:  normal mood and affect. behavior is normal. Thought content normal.    Recent Labs: 04/04/2018: ALT 35; TSH 2.386 04/06/2018: BUN 28; Creatinine, Ser 1.62; Hemoglobin 11.6; Platelets  218; Potassium 4.5; Sodium 136    Lipid Panel Lab Results  Component Value Date   CHOL 132 04/05/2018   HDL 29 (L) 04/05/2018   LDLCALC 91 04/05/2018   TRIG 58 04/05/2018      Wt Readings from Last 3 Encounters:  05/06/18 221 lb (100.2 kg)  04/16/18 223 lb (101.2 kg)  04/09/18 226 lb (102.5 kg)       ASSESSMENT AND PLAN:  Complete heart block Recent pacer placement Has follow-up with Dr. Rayann Heman, EP in July 2019 Energy is much better  Coronary artery disease involving native coronary artery of native heart with stable angina pectoris -  Previous echocardiogram with anterior wall hypokinesis, old MI Stress test showing no ischemia, dated August 2017 Currently with no symptoms of angina. No further workup at this time. Continue current medication regimen.  Persistent atrial fibrillation (HCC) - He has indicated that he would like to consider a NOAC We will hold the warfarin In 4 days he will start  Eliquis 5 mg twice a day He does not meet weight criteria or age criteria for lower dose Long discussion with him concerning risk and benefit  Secondary hypertension, unspecified Blood pressure is well controlled on today's visit. No medication changes Stable  Hyperlipidemia  on  Lipitor and Zetia  Previous noncompliance with several of his medications Stressed importance of staying on his medications goal LDL less than 70  S/P CABG x 3 Previous Stress test as above, no ischemia No further workup Active with no anginal symptoms, stable  Shortness of breath On Lasix twice a day,  Appears euvolemic Stable  Chronic low back pain Seen by Dr. Carloyn Manner Reports back pain is stable Still working   Total encounter time more than 45 minutes  Greater than 50% was spent in counseling and coordination of care with the patient      Orders Placed This Encounter  Procedures  . EKG 12-Lead     Signed, Esmond Plants, M.D., Ph.D. 05/06/2018  [place], [place]

## 2018-05-06 ENCOUNTER — Ambulatory Visit (INDEPENDENT_AMBULATORY_CARE_PROVIDER_SITE_OTHER): Payer: Medicare Other

## 2018-05-06 ENCOUNTER — Encounter: Payer: Self-pay | Admitting: Cardiovascular Disease

## 2018-05-06 ENCOUNTER — Ambulatory Visit (INDEPENDENT_AMBULATORY_CARE_PROVIDER_SITE_OTHER): Payer: Medicare Other | Admitting: Cardiovascular Disease

## 2018-05-06 ENCOUNTER — Other Ambulatory Visit: Payer: Self-pay | Admitting: Cardiovascular Disease

## 2018-05-06 VITALS — BP 124/60 | HR 73 | Ht 66.0 in | Wt 221.0 lb

## 2018-05-06 DIAGNOSIS — E782 Mixed hyperlipidemia: Secondary | ICD-10-CM | POA: Diagnosis not present

## 2018-05-06 DIAGNOSIS — I4821 Permanent atrial fibrillation: Secondary | ICD-10-CM

## 2018-05-06 DIAGNOSIS — M3130 Wegener's granulomatosis without renal involvement: Secondary | ICD-10-CM | POA: Diagnosis not present

## 2018-05-06 DIAGNOSIS — Z7901 Long term (current) use of anticoagulants: Secondary | ICD-10-CM

## 2018-05-06 DIAGNOSIS — Z951 Presence of aortocoronary bypass graft: Secondary | ICD-10-CM | POA: Diagnosis not present

## 2018-05-06 DIAGNOSIS — I4891 Unspecified atrial fibrillation: Secondary | ICD-10-CM

## 2018-05-06 DIAGNOSIS — I442 Atrioventricular block, complete: Secondary | ICD-10-CM

## 2018-05-06 DIAGNOSIS — I25118 Atherosclerotic heart disease of native coronary artery with other forms of angina pectoris: Secondary | ICD-10-CM | POA: Diagnosis not present

## 2018-05-06 DIAGNOSIS — I482 Chronic atrial fibrillation: Secondary | ICD-10-CM

## 2018-05-06 DIAGNOSIS — I10 Essential (primary) hypertension: Secondary | ICD-10-CM

## 2018-05-06 DIAGNOSIS — Z7189 Other specified counseling: Secondary | ICD-10-CM

## 2018-05-06 LAB — POCT INR: INR: 2.6

## 2018-05-06 MED ORDER — APIXABAN 5 MG PO TABS: 5.0000 mg | ORAL_TABLET | Freq: Two times a day (BID) | ORAL | 11 refills | Status: DC

## 2018-05-06 NOTE — Patient Instructions (Addendum)
Please stop coumadin.  On Friday, May 24 start Eliquis 5 mg twice daily.

## 2018-05-06 NOTE — Patient Instructions (Addendum)
Medication Instructions:   Stop warfarin today  Wait 4 days then Please start eliquis 5 mg twice a day  Medication Samples have been provided to the patient.  Drug name: Eliquis       Strength: 5 mg        Qty: 2 boxes  LOT: [id number]  Exp.Date: Jun 2021   Labwork:  No new labs needed  Testing/Procedures:  No further testing at this time   Follow-Up: It was a pleasure seeing you in the office today. Please call us if you have new issues that need to be addressed before your next appt.  [phone]  Your physician wants you to follow-up in: 6 months.  You will receive a reminder letter in the mail two months in advance. If you don't receive a letter, please call our office to schedule the follow-up appointment.  If you need a refill on your cardiac medications before your next appointment, please call your pharmacy.  For educational health videos Log in to : [email or web address] Or : [email or web address], password : [email or web address]

## 2018-05-17 ENCOUNTER — Telehealth: Payer: Self-pay | Admitting: Cardiovascular Disease

## 2018-05-17 NOTE — Telephone Encounter (Signed)
Pt calling stating he is not able to afford the Eliquis  He was given a sample card but was told he is not allowed to use it  He states it will cost him about $435 a month He needs to talk to someone about getting some help on this  Please call back

## 2018-05-17 NOTE — Telephone Encounter (Signed)
Spoke with patient and he states that he is not able to afford Eliquis. Advised that I will get application for assistance completed and then give him a call first of the week for him to come in and complete his portion and I can give him some samples until we hear back from them. He verbalized understanding, was appreciative for the call, and had no further questions at this time.

## 2018-05-17 NOTE — Telephone Encounter (Signed)
Please review with patient on options he has for other medications that he can afford.

## 2018-05-23 NOTE — Telephone Encounter (Signed)
Patient calling the office for samples of medication:   1.  What medication and dosage are you requesting samples for? Eliquis 5 mg po BID  2.  Are you currently out of this medication? Almost waiting on shipment

## 2018-05-23 NOTE — Telephone Encounter (Signed)
Pt came by office for samples.  Eliquis 5 mg tablet [id number] Exp. 3/21 #2boxes

## 2018-05-28 MED ORDER — TAMSULOSIN 0.4 MG CAPSULE
ORAL_CAPSULE | Freq: Every day | ORAL | 3 refills | 0 days | Status: CP
Start: 2018-05-28 — End: 2018-06-03

## 2018-06-03 MED ORDER — TAMSULOSIN 0.4 MG CAPSULE
ORAL_CAPSULE | Freq: Every day | ORAL | 3 refills | 0 days | Status: CP
Start: 2018-06-03 — End: ?

## 2018-06-04 ENCOUNTER — Telehealth: Payer: Self-pay | Admitting: Cardiovascular Disease

## 2018-06-04 NOTE — Telephone Encounter (Signed)
Patient calling to check status of medication request

## 2018-06-04 NOTE — Telephone Encounter (Signed)
Spoke with patient and reviewed that I have application completed and some samples as well. Requested that he come into the office to bring in a copy of his insurance card and sign the assistance forms so we can fax them off. He verbalized understanding of our conversation, agreeable with plan, and had no further questions at this time. Completed assistance forms placed on Heather's desk if he comes in before Thursday.

## 2018-06-04 NOTE — Telephone Encounter (Signed)
[name], Did he ever come by and fill out patient assistance for Eliquis. He is calling for more samples and states he has been out of Eliquis for 2 days.

## 2018-06-04 NOTE — Telephone Encounter (Signed)
Patient calling the office for samples of medication:   1.  What medication and dosage are you requesting samples for? Eliquis 5 mg po BID  2.  Are you currently out of this medication? 2 days

## 2018-06-04 NOTE — Telephone Encounter (Signed)
Please review for samples.  

## 2018-06-05 NOTE — Telephone Encounter (Signed)
[name],  He didn't come by. I left the forms on your desk.

## 2018-06-06 NOTE — Telephone Encounter (Signed)
Patient in today to sign assistant forms. He did not have his medication coverage but did bring a invoice which identified his account number. Will fax this along with assistance application and pend review.   Medication Samples have been provided to the patient.  Drug name: Eliquis       Strength: 5 mg        Qty: 1 box  LOT: [id number]  Exp.Date: 6/21

## 2018-06-17 ENCOUNTER — Telehealth: Payer: Self-pay | Admitting: Cardiovascular Disease

## 2018-06-17 NOTE — Telephone Encounter (Signed)
Spoke with Jesus Hendrix from [employer] Patient assistant program.  She stated that patient was approved and was sent over to their pharmacy who will handle shipment. She stated processing will take 7-10 business days and that is for a 90 day supply.  She also stated that before the medication is shipped out an [phone] number will be calling the patient and that the patient HAS to speak to them before the medication will be shipped.   Spoke with patient and made him aware of what Jesus Hendrix from Patient assistant said.  He stated he only has 4 pills left.  I told him I would place 2 boxes of Jesus Hendrix samples up front  to get him by until his shipment come in.  He was very appreciative of the call.   Medication Samples have been provided to the patient.  Drug name: Jesus Hendrix      Strength: 5mg        Qty: 2 boxes LOT: [id number] Exp.Date: June 2021   Jesus Hendrix 12:03 PM 06/17/2018

## 2018-06-17 NOTE — Telephone Encounter (Signed)
Patient was approved for patient assistance for this medication. Check with patient and/or assistance program to see when he will receive it.

## 2018-06-17 NOTE — Telephone Encounter (Signed)
We can provide if there is a delay in his shipment.

## 2018-06-17 NOTE — Telephone Encounter (Signed)
Please advise if you would like me to provide patient with Eliquis samples.

## 2018-06-17 NOTE — Telephone Encounter (Signed)
  Pt would like Eliquis samples. Please call.

## 2018-07-15 ENCOUNTER — Ambulatory Visit (INDEPENDENT_AMBULATORY_CARE_PROVIDER_SITE_OTHER): Payer: Medicare Other | Admitting: Internal Medicine

## 2018-07-15 ENCOUNTER — Encounter: Payer: Self-pay | Admitting: Internal Medicine

## 2018-07-15 VITALS — BP 122/62 | HR 73 | Ht 66.0 in | Wt 221.0 lb

## 2018-07-15 DIAGNOSIS — I481 Persistent atrial fibrillation: Secondary | ICD-10-CM | POA: Diagnosis not present

## 2018-07-15 DIAGNOSIS — I441 Atrioventricular block, second degree: Secondary | ICD-10-CM | POA: Diagnosis not present

## 2018-07-15 DIAGNOSIS — I4819 Other persistent atrial fibrillation: Secondary | ICD-10-CM

## 2018-07-15 DIAGNOSIS — I25118 Atherosclerotic heart disease of native coronary artery with other forms of angina pectoris: Secondary | ICD-10-CM

## 2018-07-15 DIAGNOSIS — Z950 Presence of cardiac pacemaker: Secondary | ICD-10-CM | POA: Diagnosis not present

## 2018-07-15 LAB — CUP PACEART INCLINIC DEVICE CHECK
Battery Remaining Longevity: 106 mo
Battery Voltage: 2.99 V
Brady Statistic RV Percent Paced: 54 %
Date Time Interrogation Session: 20190729135707
Implantable Lead Location: 753858
Implantable Pulse Generator Implant Date: 20190419
Lead Channel Pacing Threshold Amplitude: 0.5 V
Lead Channel Pacing Threshold Amplitude: 0.5 V
Lead Channel Pacing Threshold Amplitude: 1 V
Lead Channel Pacing Threshold Pulse Width: 0.5 ms
Lead Channel Pacing Threshold Pulse Width: 0.5 ms
Lead Channel Setting Pacing Amplitude: 2 V
Lead Channel Setting Pacing Pulse Width: 0.5 ms
Lead Channel Setting Sensing Sensitivity: 4 mV
MDC IDC LEAD IMPLANT DT: 20190419
MDC IDC LEAD IMPLANT DT: 20190419
MDC IDC LEAD LOCATION: 753860
MDC IDC MSMT LEADCHNL LV IMPEDANCE VALUE: 612.5 Ohm
MDC IDC MSMT LEADCHNL LV PACING THRESHOLD AMPLITUDE: 1 V
MDC IDC MSMT LEADCHNL LV PACING THRESHOLD PULSEWIDTH: 0.5 ms
MDC IDC MSMT LEADCHNL LV PACING THRESHOLD PULSEWIDTH: 0.5 ms
MDC IDC MSMT LEADCHNL RV IMPEDANCE VALUE: 487.5 Ohm
MDC IDC MSMT LEADCHNL RV SENSING INTR AMPL: 12 mV
MDC IDC PG SERIAL: 9017264
MDC IDC SET LEADCHNL LV PACING PULSEWIDTH: 0.5 ms
MDC IDC SET LEADCHNL RV PACING AMPLITUDE: 2.5 V
MDC IDC STAT BRADY RA PERCENT PACED: 0 %
Pulse Gen Model: 3262

## 2018-07-15 NOTE — Progress Notes (Signed)
PCP: Patient, No Pcp Per Primary Cardiologist: Dr Rockey Situ Primary EP:  Dr Rayann Heman  Jesus Hendrix is a 72 y.o. male who presents today for routine electrophysiology followup.  Since his recent CRT-P implant, the patient reports doing very well.  Today, he denies symptoms of palpitations, chest pain, shortness of breath,  lower extremity edema, dizziness, presyncope, or syncope.  The patient is otherwise without complaint today.   Past Medical History:  Diagnosis Date  . Anxiety   . Aortic stenosis    a. mild on echo 04/2014  . Atrial fibrillation ([place]) 06/06/2016   a. newly diagnosed 06/06/2016; b. CHADS2VASc => 5 (CHF, HTN, age x 1, DM, vascular disease)  . Choledocholithiasis   . Chronic kidney disease   . Chronic systolic CHF (congestive heart failure) ([place])    a. echo 04/2014: EF 45-50%, nl LV dia fxn, mild AS, LA mildly dilated, PASP nl  . COPD (chronic obstructive pulmonary disease) ([place])    Not on home oxygen  . Coronary artery disease    a. status post CABG in 2007; b. nuc stress test 2014: small region of mild ischemia in the inferior territory, EF 61%, no ischemia, scan similar to prior scan in 2009  . Exogenous obesity   . GERD (gastroesophageal reflux disease)   . Gout   . Hiatal hernia   . Hip pain, chronic 08/03/2016   Waiting to have hip replacement pending NM stress test results per patient.  . History of colonic diverticulitis   . History of renal cell carcinoma    a. s/p laser ablation   . Hyperlipidemia   . Hypertension   . Near syncope    a. 3 separate events in 2017  . Nephrolithiasis   . Panic attacks   . Spinal stenosis   . Symptomatic bradycardia    s/p [employer] Quarda Allure MP RF model [place] [id number] (serial  Number [id number]) biventricular pacemaker. 04/05/18  . Temporomandibular joint pain dysfunction syndrome   . Umbilical hernia   . Vitamin D deficiency   . Wegener's granulomatosis with renal involvement [facility])    Past Surgical History:    Procedure Laterality Date  . CARDIAC CATHETERIZATION    . CHOLECYSTECTOMY    . COLONOSCOPY N/A 06/03/2015   Procedure: COLONOSCOPY;  Surgeon: Lucilla Lame, MD;  Location: [place];  Service: Gastroenterology;  Laterality: N/A;  . CORONARY ARTERY BYPASS GRAFT  2004   CABG x 3  . EYE SURGERY    . HERNIA REPAIR     x 2  . HIP SURGERY     right hip replacement  . INTRAOCULAR LENS INSERTION    . KIDNEY SURGERY     cancer  . NOSE SURGERY    . PACEMAKER IMPLANT N/A 04/05/2018   SJM Biventricular pacemaker implanted by Dr Rayann Heman for second degree AV block, EF < 50% and permanent afib  . POLYPECTOMY  06/03/2015   Procedure: POLYPECTOMY;  Surgeon: Lucilla Lame, MD;  Location: [place];  Service: Gastroenterology;;  . PROSTATE SURGERY    . TOTAL HIP ARTHROPLASTY Left 12/19/2016   Procedure: LEFT TOTAL HIP ARTHROPLASTY;  Surgeon: Garald Balding, MD;  Location: [place];  Service: Orthopedics;  Laterality: Left;    ROS- all systems are reviewed and negative except as per HPI above  Current Outpatient Medications  Medication Sig Dispense Refill  . apixaban (ELIQUIS) 5 MG TABS tablet Take 1 tablet (5 mg total) by mouth 2 (two) times daily.  60 tablet 11  . atorvastatin (LIPITOR) 20 MG tablet Take 20 mg by mouth daily.    . Cholecalciferol (VITAMIN D) 2000 units CAPS Take 2,000 Units by mouth at bedtime.     . diclofenac sodium (VOLTAREN) 1 % GEL Apply 4 g 4 (four) times daily topically. 1 Tube 3  . ezetimibe (ZETIA) 10 MG tablet Take 1 tablet (10 mg total) by mouth daily. 90 tablet 3  . FLUoxetine (PROZAC) 10 MG capsule Take 1 capsule (10 mg total) daily by mouth. 90 capsule 3  . fluticasone (FLONASE) 50 MCG/ACT nasal spray Place 2 sprays into both nostrils daily.     . furosemide (LASIX) 20 MG tablet Take 1 tablet (20 mg total) by mouth 2 (two) times daily as needed for fluid or edema. (Patient taking differently: Take 40 mg by mouth daily. ) 60 tablet 6  . gabapentin  (NEURONTIN) 100 MG capsule Take 100 mg by mouth daily.    Marland Kitchen losartan (COZAAR) 50 MG tablet Take 0.5 tablets (25 mg total) by mouth at bedtime. (Patient taking differently: Take 25 mg by mouth daily. ) 90 tablet 3  . metoprolol succinate (TOPROL-XL) 25 MG 24 hr tablet Take 1 tablet (25 mg total) by mouth daily. 90 tablet 3  . pantoprazole (PROTONIX) 40 MG tablet Take 40 mg by mouth daily.    . tamsulosin (FLOMAX) 0.4 MG CAPS capsule Take 0.4 mg by mouth daily after breakfast.      No current facility-administered medications for this visit.     Physical Exam: Vitals:   07/15/18 0947  BP: 122/62  Pulse: 73  Weight: 221 lb (100.2 kg)  Height: 5\' 6"  (1.676 m)    GEN- The patient is well appearing, alert and oriented x 3 today.   Head- normocephalic, atraumatic Eyes-  Sclera clear, conjunctiva pink Ears- hearing intact Oropharynx- clear Lungs- Clear to ausculation bilaterally, normal work of breathing Chest- pacemaker pocket is well healed Heart- Regular rate and rhythm, no murmurs, rubs or gallops, PMI not laterally displaced GI- soft, NT, ND, + BS Extremities- no clubbing, cyanosis, or edema  Pacemaker interrogation- reviewed in detail today,  See PACEART report  ekg tracing ordered today is personally reviewed and shows afib with LBBB and demand pacing  Assessment and Plan:  1. 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  CRT-P implanted for EF < 50%.  I am not sure that he will need to BiV pace currently, though it is nice to have this option as his AV nodal dysfunction progresses No changes today Will enroll in ICM device clinic  2. Permanent afib Anticoagulated  Merlin Return to see EP NP in a year  [name] MD, [facility] 07/15/2018 10:25 AM

## 2018-07-15 NOTE — Patient Instructions (Addendum)
Medication Instructions:  Your physician recommends that you continue on your current medications as directed. Please refer to the Current Medication list given to you today.  Labwork: None ordered.  Testing/Procedures: None ordered.  Follow-Up: Your physician wants you to follow-up in: one year with Chanetta Marshall, NP.   You will receive a reminder letter in the mail two months in advance. If you don't receive a letter, please call our office to schedule the follow-up appointment.  Remote monitoring is used to monitor your Pacemaker from home. This monitoring reduces the number of office visits required to check your device to one time per year. It allows [place] to keep an eye on the functioning of your device to ensure it is working properly. You are scheduled for a device check from home on 10/14/2018. You may send your transmission at any time that day. If you have a wireless device, the transmission will be sent automatically. After your physician reviews your transmission, you will receive a postcard with your next transmission date.  Any Other Special Instructions Will Be Listed Below (If Applicable).  You have been referred to Sharman Cheek with ICM clinic  If you need a refill on your cardiac medications before your next appointment, please call your pharmacy.

## 2018-07-17 NOTE — Addendum Note (Signed)
Addended by: Marlis Edelson C on: 07/17/2018 08:52 AM   Modules accepted: Orders

## 2018-08-14 ENCOUNTER — Telehealth: Payer: Self-pay

## 2018-08-14 NOTE — Telephone Encounter (Signed)
Referred to ICM clinic by Dr Rayann Heman.  Spoke with husband and he requested I speak to his wife, Jesus Hendrix.  ICM intro given and she agreed to have monthly ICM follow up for patient.  Patient has monitor by bedside and explained scheduled report will be transmitted during the night.  She did not have a pen to write number and advised her to call [place] office if patient has any fluid symptoms.  1st remote transmission scheduled for 08/29/2018.  Wife stated to call her monthly instead of patient at [phone].

## 2018-08-29 ENCOUNTER — Ambulatory Visit (INDEPENDENT_AMBULATORY_CARE_PROVIDER_SITE_OTHER): Payer: Medicare Other

## 2018-08-29 DIAGNOSIS — I5032 Chronic diastolic (congestive) heart failure: Secondary | ICD-10-CM | POA: Diagnosis not present

## 2018-08-29 DIAGNOSIS — Z950 Presence of cardiac pacemaker: Secondary | ICD-10-CM | POA: Diagnosis not present

## 2018-08-29 NOTE — Progress Notes (Signed)
EPIC Encounter for ICM Monitoring  Patient Name: Jesus Hendrix is a 72 y.o. male Date: 08/29/2018 Primary Care Physican: Patient, No Pcp Per Primary Cardiologist: Rockey Situ Electrophysiologist: Allred Dry Weight: 221 lbs         1st ICM transmission.  Heart Failure questions reviewed, pt asymptomatic.  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  Prescribed dosage: Furosemide 20 mg take 1 tablet by mouth twice a day as needed for fluid.    Recommendations: No changes.   Encouraged to call for fluid symptoms.  Follow-up plan: ICM clinic phone appointment on 09/30/2018.       Copy of ICM check sent to Dr. Rayann Heman.   3 month ICM trend: 08/29/2018    1 Year ICM trend:       [name], RN 08/29/2018 3:55 PM

## 2018-09-18 MED ORDER — PANTOPRAZOLE 40 MG TABLET,DELAYED RELEASE
ORAL_TABLET | Freq: Every day | ORAL | 3 refills | 0 days | Status: CP
Start: 2018-09-18 — End: ?

## 2018-09-25 ENCOUNTER — Telehealth: Payer: Self-pay | Admitting: Cardiovascular Disease

## 2018-09-25 NOTE — Telephone Encounter (Signed)
To Dr. Bluford Kaufmann, RN to review.  Letter from patient placed on [name]'s desk.

## 2018-09-25 NOTE — Telephone Encounter (Signed)
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  Placed old one in nurse box per patient request call when ready for pick up

## 2018-09-26 NOTE — Telephone Encounter (Signed)
Spoke with patient and made him aware that provider is out of the office until 10/21 and that once he returns we can get letter if provider approves. He verbalized understanding with no further questions at this time.

## 2018-09-26 NOTE — Telephone Encounter (Signed)
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

## 2018-09-27 ENCOUNTER — Encounter: Payer: Self-pay | Admitting: *Deleted

## 2018-09-27 NOTE — Telephone Encounter (Signed)
Spoke with patient and he denies any changes since last visit. No chest pain, no shortness of breath, and states that he has been doing good. Advised that I would print letter and have provider sign when he returns and call when it is ready to be picked up. He was appreciative for the call with no further questions at this time.

## 2018-09-30 ENCOUNTER — Ambulatory Visit (INDEPENDENT_AMBULATORY_CARE_PROVIDER_SITE_OTHER): Payer: Medicare Other

## 2018-09-30 DIAGNOSIS — Z950 Presence of cardiac pacemaker: Secondary | ICD-10-CM

## 2018-09-30 DIAGNOSIS — I5032 Chronic diastolic (congestive) heart failure: Secondary | ICD-10-CM | POA: Diagnosis not present

## 2018-10-01 NOTE — Progress Notes (Signed)
EPIC Encounter for ICM Monitoring  Patient Name: Jesus Hendrix is a 72 y.o. male Date: 10/01/2018 Primary Care Physican: Patient, No Pcp Per Primary Cardiologist: Rockey Situ Electrophysiologist: Allred Dry Weight: 221 lbs       Heart Failure questions reviewed, pt asymptomatic.  Patient has a stomach virus with diarrhea which report suggests dryness in the last few days.  No dehydration symptoms. Advised to stay hydrated and if diarrhea persist or worsen to call physician or use ER.    Thoracic impedance normal.   Prescribed: Furosemide 20 mg take 1 tablet by mouth twice a day as needed for fluid.    Recommendations: No changes.    Encouraged to call for fluid symptoms.  Follow-up plan: ICM clinic phone appointment on 10/31/2018.    Copy of ICM check sent to Dr. Rayann Heman.   3 month ICM trend: 09/30/2018    1 Year ICM trend:       [name], RN 10/01/2018 8:02 AM

## 2018-10-07 ENCOUNTER — Encounter: Payer: Self-pay | Admitting: *Deleted

## 2018-10-07 NOTE — Telephone Encounter (Signed)
Letter completed and placed up front for patient to pick up.

## 2018-10-30 ENCOUNTER — Telehealth: Payer: Self-pay | Admitting: Cardiovascular Disease

## 2018-10-30 ENCOUNTER — Ambulatory Visit: Admit: 2018-10-30 | Discharge: 2018-10-30 | Payer: MEDICARE

## 2018-10-30 DIAGNOSIS — I776 Arteritis, unspecified: Principal | ICD-10-CM

## 2018-10-30 DIAGNOSIS — R8281 Pyuria: Secondary | ICD-10-CM

## 2018-10-30 DIAGNOSIS — N183 Chronic kidney disease, stage 3 (moderate): Secondary | ICD-10-CM

## 2018-10-30 MED ORDER — FUROSEMIDE 20 MG TABLET
ORAL_TABLET | Freq: Two times a day (BID) | ORAL | 4 refills | 0.00000 days | Status: SS
Start: 2018-10-30 — End: 2019-06-25

## 2018-10-30 MED ORDER — LOSARTAN 50 MG TABLET
ORAL_TABLET | Freq: Every day | ORAL | 4 refills | 0.00000 days | Status: CP
Start: 2018-10-30 — End: 2018-11-07

## 2018-10-30 MED ORDER — GABAPENTIN 100 MG CAPSULE
ORAL_CAPSULE | Freq: Every evening | ORAL | 3 refills | 0.00000 days | Status: SS
Start: 2018-10-30 — End: 2019-06-15

## 2018-10-30 NOTE — Telephone Encounter (Signed)
Patient dropped off assistance forms for medication from [place] in nurse box

## 2018-10-31 ENCOUNTER — Ambulatory Visit (INDEPENDENT_AMBULATORY_CARE_PROVIDER_SITE_OTHER): Payer: Medicare Other

## 2018-10-31 ENCOUNTER — Ambulatory Visit (INDEPENDENT_AMBULATORY_CARE_PROVIDER_SITE_OTHER): Payer: Medicare Other | Admitting: *Deleted

## 2018-10-31 DIAGNOSIS — I442 Atrioventricular block, complete: Secondary | ICD-10-CM

## 2018-10-31 DIAGNOSIS — Z950 Presence of cardiac pacemaker: Secondary | ICD-10-CM | POA: Diagnosis not present

## 2018-10-31 DIAGNOSIS — I5032 Chronic diastolic (congestive) heart failure: Secondary | ICD-10-CM

## 2018-10-31 NOTE — Telephone Encounter (Signed)
Spoke with patient and reviewed that we will need to wait until the first of January to get his forms completed and would need his tax return for that as well. He verbalized understanding with no further questions. Advised that I would mail out a letter with instructions for patient assistance application and that we would process when we get the rest of his documents.

## 2018-10-31 NOTE — Progress Notes (Signed)
Remote pacemaker transmission.   

## 2018-10-31 NOTE — Progress Notes (Signed)
EPIC Encounter for ICM Monitoring  Patient Name: Jesus Hendrix is a 72 y.o. male Date: 10/31/2018 Primary Care Physican: Patient, No Pcp Per Primary Cardiologist:Gollan Electrophysiologist:Allred Last Weight: 221lbs Today's Weight: 214 lbs        Heart Failure questions revied, pt asymptomatic.   Thoracic impedance normal but was abnormal suggesting fluid accumulation from 10/06/2018 - 10/18/2018.   Prescribed: Furosemide20 mg take 1 tablet by mouth twice a day as needed for fluid.  Recommendations: No changes.  Encouraged to call for fluid symptoms.  Follow-up plan: ICM clinic phone appointment on 12/02/2018.    Copy of ICM check sent to Dr. Rayann Heman.   3 month ICM trend: 10/31/2018    1 Year ICM trend:       [name], RN 10/31/2018 11:33 AM

## 2018-11-05 ENCOUNTER — Ambulatory Visit: Admit: 2018-11-05 | Discharge: 2018-11-05 | Payer: MEDICARE

## 2018-11-05 DIAGNOSIS — R8281 Pyuria: Principal | ICD-10-CM

## 2018-11-05 DIAGNOSIS — N183 Chronic kidney disease, stage 3 (moderate): Principal | ICD-10-CM

## 2018-11-07 MED ORDER — LOSARTAN 25 MG TABLET
ORAL_TABLET | Freq: Every day | ORAL | 3 refills | 0 days | Status: CP
Start: 2018-11-07 — End: 2019-06-25

## 2018-11-08 NOTE — Telephone Encounter (Signed)
Spoke with patient. He wanted to make sure what exactly was needed for renewal of patient assistance forms for the new year for Eliquis.  Per previous entry from [facility] [facility], it appears he needs to simply bring in his proof of income.  He verbalized understanding and will do so. Routing to [name] to make her aware and if theres anything else needed. Patient states he's already brought in the forms and signed them and [name] should have them.

## 2018-11-08 NOTE — Telephone Encounter (Signed)
Please call to discuss what is needed to complete his medication forms.

## 2018-11-12 ENCOUNTER — Ambulatory Visit: Admit: 2018-11-12 | Discharge: 2018-11-13 | Payer: MEDICARE

## 2018-11-12 DIAGNOSIS — N179 Acute kidney failure, unspecified: Principal | ICD-10-CM

## 2018-11-13 NOTE — Telephone Encounter (Signed)
Called number listed and there was no answer and no voicemail.

## 2018-11-28 NOTE — Telephone Encounter (Signed)
Patient assistance application for 6148 completed but patient does need to sign one of the pages before it can be faxed. Placed above my desk and left voicemail message for patient to call back.

## 2018-12-02 ENCOUNTER — Ambulatory Visit (INDEPENDENT_AMBULATORY_CARE_PROVIDER_SITE_OTHER): Payer: Medicare Other

## 2018-12-02 DIAGNOSIS — I5032 Chronic diastolic (congestive) heart failure: Secondary | ICD-10-CM | POA: Diagnosis not present

## 2018-12-02 DIAGNOSIS — Z950 Presence of cardiac pacemaker: Secondary | ICD-10-CM | POA: Diagnosis not present

## 2018-12-03 NOTE — Progress Notes (Signed)
EPIC Encounter for ICM Monitoring  Patient Name: Jesus Hendrix is a 72 y.o. male Date: 12/03/2018 Primary Care Physican: Patient, No Pcp Per Primary Cardiologist:Gollan Electrophysiologist:Allred Last Weight: 214lbs Today's Weight: 215 lbs                                                    Heart Failure questions revied, pt asymptomatic.   Thoracic impedance normal.   Prescribed: Furosemide20 mg take 1 tablet by mouth twice a day as needed for fluid.  Recommendations: No changes.  Encouraged to call for fluid symptoms.  Follow-up plan: ICM clinic phone appointment on 01/06/2019.    Copy of ICM check sent to Dr. Rayann Heman.    3 month ICM trend: 12/02/2018    1 Year ICM trend:       [name], RN 12/03/2018 11:50 AM

## 2018-12-03 NOTE — Telephone Encounter (Signed)
Patient returning call.

## 2018-12-03 NOTE — Telephone Encounter (Signed)
Paperwork signed by patient and will fax documents over to patient assistance foundation and place in filing cabinet in sample closet.

## 2018-12-03 NOTE — Telephone Encounter (Signed)
Patient notified and will come by our office today to sign the paper.

## 2018-12-31 LAB — CUP PACEART REMOTE DEVICE CHECK
Battery Remaining Longevity: 103 mo
Battery Remaining Percentage: 95.5 %
Date Time Interrogation Session: 20191114070012
Implantable Lead Implant Date: 20190419
Implantable Lead Implant Date: 20190419
Implantable Lead Location: 753860
Implantable Pulse Generator Implant Date: 20190419
Lead Channel Impedance Value: 450 Ohm
Lead Channel Impedance Value: 600 Ohm
Lead Channel Pacing Threshold Amplitude: 0.5 V
Lead Channel Pacing Threshold Pulse Width: 0.5 ms
Lead Channel Sensing Intrinsic Amplitude: 12 mV
Lead Channel Setting Pacing Amplitude: 2.5 V
Lead Channel Setting Pacing Pulse Width: 0.5 ms
Lead Channel Setting Sensing Sensitivity: 4 mV
MDC IDC LEAD LOCATION: 753858
MDC IDC MSMT BATTERY VOLTAGE: 3.01 V
MDC IDC MSMT LEADCHNL LV PACING THRESHOLD AMPLITUDE: 1 V
MDC IDC MSMT LEADCHNL RV PACING THRESHOLD PULSEWIDTH: 0.5 ms
MDC IDC SET LEADCHNL LV PACING AMPLITUDE: 2 V
MDC IDC SET LEADCHNL RV PACING PULSEWIDTH: 0.5 ms
Pulse Gen Model: 3262
Pulse Gen Serial Number: 9017264

## 2019-01-01 ENCOUNTER — Ambulatory Visit: Admit: 2019-01-01 | Discharge: 2019-01-01 | Payer: MEDICARE

## 2019-01-01 DIAGNOSIS — N183 Chronic kidney disease, stage 3 (moderate): Secondary | ICD-10-CM

## 2019-01-01 DIAGNOSIS — N401 Enlarged prostate with lower urinary tract symptoms: Secondary | ICD-10-CM

## 2019-01-01 DIAGNOSIS — I776 Arteritis, unspecified: Principal | ICD-10-CM

## 2019-01-01 DIAGNOSIS — I10 Essential (primary) hypertension: Secondary | ICD-10-CM

## 2019-01-01 DIAGNOSIS — N138 Other obstructive and reflux uropathy: Secondary | ICD-10-CM

## 2019-01-06 ENCOUNTER — Ambulatory Visit (INDEPENDENT_AMBULATORY_CARE_PROVIDER_SITE_OTHER): Payer: No Typology Code available for payment source

## 2019-01-06 DIAGNOSIS — I5032 Chronic diastolic (congestive) heart failure: Secondary | ICD-10-CM

## 2019-01-06 DIAGNOSIS — Z950 Presence of cardiac pacemaker: Secondary | ICD-10-CM

## 2019-01-07 ENCOUNTER — Telehealth: Payer: Self-pay

## 2019-01-07 NOTE — Telephone Encounter (Signed)
Remote ICM transmission received.  Attempted call to patient regarding ICM remote transmission and left detailed message, per DPR, with next ICM remote transmission date of 02/10/2019.  Advised to return call for any fluid symptoms or questions.

## 2019-01-07 NOTE — Progress Notes (Signed)
EPIC Encounter for ICM Monitoring  Patient Name: Jesus Hendrix is a 73 y.o. male Date: 01/07/2019 Primary Care Physican: Patient, No Pcp Per Primary Cardiologist:Gollan Electrophysiologist:Allred Last Weight:215lbs Today's Weight: unknown   Attempted call to patient.  Left detailed message per DPR regarding transmission. Transmission reviewed.   Thoracic impedance normal.   Prescribed:Furosemide20 mg take 1 tablet by mouth twice a day as needed for fluid.  Recommendations: Left voice mail with ICM number and encouraged to call if experiencing any fluid symptoms.  Follow-up plan: ICM clinic phone appointment on2/24/2020.   Copy of ICM check sent to Dr.Allred.  3 month ICM trend: 01/06/2019    1 Year ICM trend:       [name], RN 01/07/2019 4:04 PM

## 2019-01-23 ENCOUNTER — Ambulatory Visit: Admit: 2019-01-23 | Discharge: 2019-01-24 | Payer: MEDICARE

## 2019-01-23 DIAGNOSIS — C641 Malignant neoplasm of right kidney, except renal pelvis: Secondary | ICD-10-CM

## 2019-01-23 DIAGNOSIS — N390 Urinary tract infection, site not specified: Principal | ICD-10-CM

## 2019-01-30 ENCOUNTER — Other Ambulatory Visit (INDEPENDENT_AMBULATORY_CARE_PROVIDER_SITE_OTHER): Payer: Self-pay | Admitting: Orthopaedic Surgery

## 2019-01-30 ENCOUNTER — Ambulatory Visit (INDEPENDENT_AMBULATORY_CARE_PROVIDER_SITE_OTHER): Payer: BLUE CROSS/BLUE SHIELD | Admitting: Orthopaedic Surgery

## 2019-01-30 ENCOUNTER — Ambulatory Visit (INDEPENDENT_AMBULATORY_CARE_PROVIDER_SITE_OTHER): Payer: Medicare Other

## 2019-01-30 ENCOUNTER — Encounter (INDEPENDENT_AMBULATORY_CARE_PROVIDER_SITE_OTHER): Payer: Self-pay | Admitting: Orthopaedic Surgery

## 2019-01-30 ENCOUNTER — Ambulatory Visit (INDEPENDENT_AMBULATORY_CARE_PROVIDER_SITE_OTHER): Payer: BLUE CROSS/BLUE SHIELD

## 2019-01-30 VITALS — BP 138/86 | HR 65 | Ht 67.0 in | Wt 216.0 lb

## 2019-01-30 DIAGNOSIS — I442 Atrioventricular block, complete: Secondary | ICD-10-CM

## 2019-01-30 DIAGNOSIS — M5442 Lumbago with sciatica, left side: Secondary | ICD-10-CM | POA: Diagnosis not present

## 2019-01-30 DIAGNOSIS — M5441 Lumbago with sciatica, right side: Secondary | ICD-10-CM | POA: Diagnosis not present

## 2019-01-30 DIAGNOSIS — Z96642 Presence of left artificial hip joint: Secondary | ICD-10-CM | POA: Diagnosis not present

## 2019-01-30 DIAGNOSIS — G8929 Other chronic pain: Secondary | ICD-10-CM

## 2019-01-30 DIAGNOSIS — M25552 Pain in left hip: Secondary | ICD-10-CM | POA: Diagnosis not present

## 2019-01-30 DIAGNOSIS — M5416 Radiculopathy, lumbar region: Secondary | ICD-10-CM

## 2019-01-30 MED ORDER — METHYLPREDNISOLONE ACETATE 40 MG/ML IJ SUSP
80.0000 mg | INTRAMUSCULAR | Status: AC | PRN
  Administered 2019-01-30: 80 mg via INTRA_ARTICULAR

## 2019-01-30 MED ORDER — BUPIVACAINE HCL 0.5 % IJ SOLN
2.0000 mL | INTRAMUSCULAR | Status: AC | PRN
  Administered 2019-01-30: 2 mL via INTRA_ARTICULAR

## 2019-01-30 MED ORDER — LIDOCAINE HCL 1 % IJ SOLN
2.0000 mL | INTRAMUSCULAR | Status: AC | PRN
  Administered 2019-01-30: 2 mL

## 2019-01-30 NOTE — Progress Notes (Signed)
Office Visit Note   Patient: Jesus Hendrix           Date of Birth: 1946/12/05           MRN: [id number] Visit Date: 01/30/2019              Requested by: No referring provider defined for this encounter. PCP: Patient, No Pcp Per   Assessment & Plan: Visit Diagnoses:  1. Pain in left hip   2. Status post left hip replacement   3. Chronic bilateral low back pain with bilateral sciatica    Several years status post primary left total hip replacement.  Having some discomfort in the area of the greater trochanter which he notes he has had off and on since the surgery.  He is not having any groin or anterior thigh pain.  It appears to be scar tissue beneath the incision is I can roll or overlie fingers.  I did inject that area with complete relief of his pain.  He also has another issue with some numbness in the left lower leg associated with some weakness.  He has had chronic back issues.  An MRI scan in 2010 revealed significant stenosis centrally and foraminally.  I will obtain a CT scan as he now has a pacemaker. Essentially I think he has 2 separate problems.  I think that that area of scar beneath the old incision creates some problem.  The issue with his leg I believe is related to his stenosis.  Office visit over 30 minutes 50% of the time in counseling.  Follow-Up Instructions: Return after CT scan L-S spine.   Orders:  Orders Placed This Encounter  Procedures  . Large Joint Inj: L greater trochanter  . XR HIP UNILAT W OR W/O PELVIS 2-3 VIEWS LEFT   No orders of the defined types were placed in this encounter.     Procedures: Large Joint Inj: L greater trochanter on 01/30/2019 10:49 AM Indications: pain and diagnostic evaluation Details: 25 G 1.5 in needle, lateral approach  Arthrogram: No  Medications: 2 mL lidocaine 1 %; 2 mL bupivacaine 0.5 %; 80 mg methylPREDNISolone acetate 40 MG/ML Procedure, treatment alternatives, risks and benefits explained, specific risks  discussed. Consent was given by the patient. Immediately prior to procedure a time out was called to verify the correct patient, procedure, equipment, support staff and site/side marked as required. Patient was prepped and draped in the usual sterile fashion.       Clinical Data: No additional findings.   Subjective: Chief Complaint  Patient presents with  . Left Hip - Follow-up    Left THA 12/19/16  Patient presents today with left hip pain. He said that it has bothered him since surgery. When he sits down to tie his shoes to lay down in his bed, his hip pops. When his hip pops it hurts, but usually the pain subsides after he moves his left leg around. He also complains of left leg weakness and numbness at his knee. He takes Tylenol as needed. Mr. Mcquigg has had some episodes of pain over the lateral aspect of his left hip since surgery.  Prior films demonstrated myositis ossificans but he really has not had groin pain or anterior thigh pain.  He does have some popping and clicking that he can localize over the inferior aspect of his hip incision.  In addition is been experiencing some weakness in his left leg associated with some numbness "several areas" below the knee.  He did have a prior MRI scan of his lumbar spine in 2010 revealing significant stenosis at L4-5.  He does use a cane for ambulation.  He also experiences claudication in both lower extremities when he stands for a length of time or when he ambulates any distance   HPI  Review of Systems   Objective: Vital Signs: BP 138/86   Pulse 65   Ht 5\' 7"  (1.702 m)   Wt 216 lb (98 kg)   BMI 33.83 kg/m   Physical Exam Constitutional:      Appearance: He is well-developed.  Eyes:     Pupils: Pupils are equal, round, and reactive to light.  Pulmonary:     Effort: Pulmonary effort is normal.  Skin:    General: Skin is warm and dry.  Neurological:     Mental Status: He is alert and oriented to person, place, and time.    Psychiatric:        Behavior: Behavior normal.     Ortho Exam awake alert and oriented x3.  Comfortable sitting.  Hard of hearing.  Uses a cane for ambulation.  Has local tenderness directly over the greater trochanter of his left hip.  There is a band of tissue that I can roll beneath my fingers over the inferior aspect of his old incision.  He had no pain at all with range of motion of his left hip and no anterior thigh pain.  Reflexes were depressed but symmetrical bilaterally.  I thought he had some weakness with dorsiflexion and plantarflexion of his left foot.  He does have some altered sensibility with tickly along the medial aspect of his leg as opposed to laterally.  Right leg raise was negative.  Specialty Comments:  No specialty comments available.  Imaging: Xr Hip Unilat W Or W/o Pelvis 2-3 Views Left  Result Date: 01/30/2019 AP pelvis and lateral of the left hip were obtained.  There are bilateral total hip replacements there is some early wear on the right side but it is asymptomatic.  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  No acute changes.  The hip is centered within the acetabulum.    PMFS History: Patient Active Problem List   Diagnosis Date Noted  . Complete heart block ([place]) 04/04/2018  . Severe sinus bradycardia 04/04/2018  . Renal cancer, right ([place]) 10/23/2017  . Basal cell carcinoma (BCC) 10/23/2017  . Post herpetic neuralgia 10/23/2017  . Lumbar radiculopathy 05/02/2017  . Spondylolisthesis of lumbar region 05/02/2017  . Spinal stenosis of lumbar region with neurogenic claudication 05/02/2017  . Encounter for anticoagulation discussion and counseling 04/08/2017  . Avascular necrosis of bone of hip, left ([place]) 12/19/2016  . Status post total replacement of left hip 12/19/2016  . Long term (current) use of anticoagulants [Z79.01] 08/09/2016  . Anxiety 06/06/2016  . Hip pain  06/06/2016  . Shortness of breath   . Benign neoplasm of sigmoid colon   . Rectal polyp   . Mild aortic valve stenosis 07/13/2014  . Carotid stenosis 07/13/2014  . Near syncope 04/15/2014  . Bruit 04/15/2014  . LBBB (left bundle branch block) 04/15/2014  . First degree AV block 04/15/2014  . Wegener's granulomatosis ([place]) 05/23/2013  . CAD (coronary artery disease) 01/08/2012  . 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 Hyperlipidemia 01/08/2012  . HTN (hypertension) 01/08/2012   Past Medical History:  Diagnosis Date  .  Anxiety   . Aortic stenosis    a. mild on echo 04/2014  . Atrial fibrillation ([place]) 06/06/2016   a. newly diagnosed 06/06/2016; b. CHADS2VASc => 5 (CHF, HTN, age x 1, DM, vascular disease)  . Choledocholithiasis   . Chronic kidney disease   . Chronic systolic CHF (congestive heart failure) ([place])    a. echo 04/2014: EF 45-50%, nl LV dia fxn, mild AS, LA mildly dilated, PASP nl  . COPD (chronic obstructive pulmonary disease) ([place])    Not on home oxygen  . Coronary artery disease    a. status post CABG in 2007; b. nuc stress test 2014: small region of mild ischemia in the inferior territory, EF 61%, no ischemia, scan similar to prior scan in 2009  . Exogenous obesity   . GERD (gastroesophageal reflux disease)   . Gout   . Hiatal hernia   . Hip pain, chronic 08/03/2016   Waiting to have hip replacement pending NM stress test results per patient.  . History of colonic diverticulitis   . History of renal cell carcinoma    a. s/p laser ablation   . Hyperlipidemia   . Hypertension   . Near syncope    a. 3 separate events in 2017  . Nephrolithiasis   . Panic attacks   . Spinal stenosis   . Symptomatic bradycardia    s/p [employer] Quarda Allure MP RF model [place] [id number] (serial  Number [id number]) biventricular pacemaker. 04/05/18  . Temporomandibular joint pain dysfunction syndrome   . Umbilical hernia   . Vitamin D deficiency   . Wegener's granulomatosis with renal involvement  ([place])     Family History  Problem Relation Age of Onset  . Heart disease Mother   . Heart disease Sister   . Heart disease Brother     Past Surgical History:  Procedure Laterality Date  . CARDIAC CATHETERIZATION    . CHOLECYSTECTOMY    . COLONOSCOPY N/A 06/03/2015   Procedure: COLONOSCOPY;  Surgeon: Lucilla Lame, MD;  Location: [facility];  Service: Gastroenterology;  Laterality: N/A;  . CORONARY ARTERY BYPASS GRAFT  2004   CABG x 3  . EYE SURGERY    . HERNIA REPAIR     x 2  . HIP SURGERY     right hip replacement  . INTRAOCULAR LENS INSERTION    . KIDNEY SURGERY     cancer  . NOSE SURGERY    . PACEMAKER IMPLANT N/A 04/05/2018   SJM Biventricular pacemaker implanted by Dr Rayann Heman for second degree AV block, EF < 50% and permanent afib  . POLYPECTOMY  06/03/2015   Procedure: POLYPECTOMY;  Surgeon: Lucilla Lame, MD;  Location: [facility];  Service: Gastroenterology;;  . PROSTATE SURGERY    . TOTAL HIP ARTHROPLASTY Left 12/19/2016   Procedure: LEFT TOTAL HIP ARTHROPLASTY;  Surgeon: Garald Balding, MD;  Location: [facility];  Service: Orthopedics;  Laterality: Left;   Social History   Occupational History  . Not on file  Tobacco Use  . Smoking status: Former Smoker    Packs/day: 0.50    Years: 25.00    Pack years: 12.50    Types: Cigarettes    Last attempt to quit: 12/29/1974    Years since quitting: 44.1  . Smokeless tobacco: Never Used  Substance and Sexual Activity  . Alcohol use: No    Alcohol/week: 2.0 standard drinks    Types: 2 Cans of beer per week    Comment: social  .  Drug use: No  . Sexual activity: Not on file       

## 2019-01-31 ENCOUNTER — Ambulatory Visit (INDEPENDENT_AMBULATORY_CARE_PROVIDER_SITE_OTHER): Payer: Self-pay | Admitting: Orthopaedic Surgery

## 2019-01-31 LAB — CUP PACEART REMOTE DEVICE CHECK
Battery Remaining Longevity: 103 mo
Implantable Lead Implant Date: 20190419
Implantable Lead Implant Date: 20190419
Implantable Lead Location: 753860
Implantable Pulse Generator Implant Date: 20190419
Lead Channel Impedance Value: 430 Ohm
Lead Channel Pacing Threshold Amplitude: 0.5 V
Lead Channel Pacing Threshold Pulse Width: 0.5 ms
Lead Channel Sensing Intrinsic Amplitude: 12 mV
Lead Channel Setting Pacing Amplitude: 2.5 V
Lead Channel Setting Pacing Pulse Width: 0.5 ms
Lead Channel Setting Pacing Pulse Width: 0.5 ms
Lead Channel Setting Sensing Sensitivity: 4 mV
MDC IDC LEAD LOCATION: 753858
MDC IDC MSMT BATTERY REMAINING PERCENTAGE: 95.5 %
MDC IDC MSMT BATTERY VOLTAGE: 3.01 V
MDC IDC MSMT LEADCHNL LV IMPEDANCE VALUE: 630 Ohm
MDC IDC MSMT LEADCHNL LV PACING THRESHOLD AMPLITUDE: 1 V
MDC IDC MSMT LEADCHNL RV PACING THRESHOLD PULSEWIDTH: 0.5 ms
MDC IDC SESS DTM: 20200213070015
MDC IDC SET LEADCHNL LV PACING AMPLITUDE: 2 V
Pulse Gen Serial Number: 9017264

## 2019-02-03 ENCOUNTER — Telehealth (HOSPITAL_COMMUNITY): Payer: Self-pay

## 2019-02-03 ENCOUNTER — Telehealth (INDEPENDENT_AMBULATORY_CARE_PROVIDER_SITE_OTHER): Payer: Self-pay | Admitting: Orthopaedic Surgery

## 2019-02-03 NOTE — Telephone Encounter (Signed)
Patient is requesting all his xrays on a CD. Call when ready [phone]

## 2019-02-04 NOTE — Telephone Encounter (Signed)
I called patient, CD at front desk

## 2019-02-10 ENCOUNTER — Ambulatory Visit (INDEPENDENT_AMBULATORY_CARE_PROVIDER_SITE_OTHER): Payer: Medicare Other

## 2019-02-10 DIAGNOSIS — Z950 Presence of cardiac pacemaker: Secondary | ICD-10-CM | POA: Diagnosis not present

## 2019-02-10 DIAGNOSIS — I5032 Chronic diastolic (congestive) heart failure: Secondary | ICD-10-CM | POA: Diagnosis not present

## 2019-02-11 ENCOUNTER — Telehealth: Payer: Self-pay

## 2019-02-11 NOTE — Progress Notes (Signed)
EPIC Encounter for ICM Monitoring  Patient Name: Jesus Hendrix is a 73 y.o. male Date: 02/11/2019 Primary Care Physican: Patient, No Pcp Per Primary Cardiologist:Gollan Electrophysiologist:Allred Last Weight:215lbs Today's Weight: unknown   Attempted call to patient.  Left detailed message per DPR regarding transmission. Transmission reviewed.   Thoracic impedance normal but was abnormal suggesting fluid accumulation from 02/01/2019 through 02/06/2019.  Prescribed:Furosemide20 mg take 1 tablet by mouth twice a day as needed for fluid.  Recommendations: Left voice mail with ICM number and encouraged to call if experiencing any fluid symptoms.  Follow-up plan: ICM clinic phone appointment on3/30/2020.   Copy of ICM check sent to Dr.Allred.  3 month ICM trend: 02/10/2019    1 Year ICM trend:       [name], RN 02/11/2019 1:13 PM

## 2019-02-11 NOTE — Telephone Encounter (Signed)
Remote ICM transmission received.  Attempted call to patient regarding ICM remote transmission and left detailed message, per DPR, with next ICM remote transmission date of 03/17/2019.  Advised to return call for any fluid symptoms or questions.

## 2019-02-11 NOTE — Progress Notes (Signed)
Remote pacemaker transmission.   

## 2019-02-12 NOTE — Progress Notes (Signed)
Patient returned call.  Transmission reviewed. He reported having a cough due to cold and congestion.  His weight remains stable at 216 lbs and denies any fluid symptoms. Encouraged him to call if he develops any symptoms and next ICM remote transmission is 03/17/2019.

## 2019-02-12 NOTE — Progress Notes (Addendum)
Patient left voice mail message to return call to [phone] and attempted call back.

## 2019-02-19 ENCOUNTER — Ambulatory Visit: Admit: 2019-02-19 | Discharge: 2019-02-20 | Payer: MEDICARE

## 2019-02-25 DIAGNOSIS — N390 Urinary tract infection, site not specified: Principal | ICD-10-CM

## 2019-02-25 DIAGNOSIS — C641 Malignant neoplasm of right kidney, except renal pelvis: Principal | ICD-10-CM

## 2019-03-11 ENCOUNTER — Telehealth: Payer: Self-pay

## 2019-03-11 NOTE — Telephone Encounter (Signed)
Lmvo patient not eligible for Patient Assistance for his Eliquis this is covered by his insurance. Patient to call if he has any questions.

## 2019-03-11 NOTE — Telephone Encounter (Signed)
Patient returning call.

## 2019-03-12 ENCOUNTER — Telehealth: Payer: Self-pay

## 2019-03-12 ENCOUNTER — Other Ambulatory Visit: Payer: Self-pay

## 2019-03-12 ENCOUNTER — Telehealth (INDEPENDENT_AMBULATORY_CARE_PROVIDER_SITE_OTHER): Payer: Medicare Other | Admitting: Cardiovascular Disease

## 2019-03-12 DIAGNOSIS — E782 Mixed hyperlipidemia: Secondary | ICD-10-CM | POA: Diagnosis not present

## 2019-03-12 DIAGNOSIS — R0602 Shortness of breath: Secondary | ICD-10-CM

## 2019-03-12 DIAGNOSIS — I442 Atrioventricular block, complete: Secondary | ICD-10-CM

## 2019-03-12 DIAGNOSIS — I6523 Occlusion and stenosis of bilateral carotid arteries: Secondary | ICD-10-CM

## 2019-03-12 DIAGNOSIS — I25118 Atherosclerotic heart disease of native coronary artery with other forms of angina pectoris: Secondary | ICD-10-CM

## 2019-03-12 DIAGNOSIS — I5032 Chronic diastolic (congestive) heart failure: Secondary | ICD-10-CM

## 2019-03-12 DIAGNOSIS — Z950 Presence of cardiac pacemaker: Secondary | ICD-10-CM

## 2019-03-12 MED ORDER — APIXABAN 5 MG PO TABS: 5.0000 mg | ORAL_TABLET | Freq: Two times a day (BID) | ORAL | 2 refills | Status: DC

## 2019-03-12 NOTE — Telephone Encounter (Signed)
TELEPHONE CALL NOTE  LANE ELAND has been deemed a candidate for a follow-up tele-health visit to limit community exposure during the Covid-19 pandemic. I spoke with the patient via phone to ensure availability of phone/video source, confirm preferred email & phone number, discuss instructions and expectations, and review consent.   I reminded RITCHARD PARAGAS to be prepared with any vital sign and/or heart rhythm information that could potentially be obtained via home monitoring, at the time of his visit.  Finally, I reminded MADELINE BEBOUT to expect an e-mail containing a link for their video-based visit approximately 15 minutes before his visit, or alternatively, a phone call at the time of his visit if his visit is planned to be a phone encounter.  Did the patient verbally consent to treatment as below? Yes  Alba Destine, RMA 03/12/2019 10:35 AM  DOWNLOADING THE SOFTWARE (If applicable)  Download the [employer] app to enable video and telephone visits with your [facility] Provider.   Instructions for downloading [employer]: - Go to https://www.webex.com/downloads.html and follow the instructions - If you have technical difficulties with downloading WebEx, please call WebEx at [phone]. - Once the app is downloaded (can be done on either mobile or desktop computer), go to Settings in the upper left hand corner.  Be sure that camera and audio are enabled.  - You will receive an email message with a link to the meeting with a time to join for your tele-health visit.  - Please download the app and have settings configured prior to the appointment time.    CONSENT FOR TELE-HEALTH VISIT - PLEASE REVIEW  I hereby voluntarily request, consent and authorize [facility] and its employed or contracted physicians, physician assistants, nurse practitioners or other licensed health care professionals (the Practitioner), to provide me with telemedicine health care services (the  "Services") as deemed necessary by the treating Practitioner. I acknowledge and consent to receive the Services by the Practitioner via telemedicine. I understand that the telemedicine visit will involve communicating with the Practitioner through live audiovisual communication technology and the disclosure of certain medical information by electronic transmission. I acknowledge that I have been given the opportunity to request an in-person assessment or other available alternative prior to the telemedicine visit and am voluntarily participating in the telemedicine visit.  I understand that I have the right to withhold or withdraw my consent to the use of telemedicine in the course of my care at any time, without affecting my right to future care or treatment, and that the Practitioner or I may terminate the telemedicine visit at any time. I understand that I have the right to inspect all information obtained and/or recorded in the course of the telemedicine visit and may receive copies of available information for a reasonable fee.  I understand that some of the potential risks of receiving the Services via telemedicine include:  Marland Kitchen Delay or interruption in medical evaluation due to technological equipment failure or disruption; . Information transmitted may not be sufficient (e.g. poor resolution of images) to allow for appropriate medical decision making by the Practitioner; and/or  . In rare instances, security protocols could fail, causing a breach of personal health information.  Furthermore, I acknowledge that it is my responsibility to provide information about my medical history, conditions and care that is complete and accurate to the best of my ability. I acknowledge that Practitioner's advice, recommendations, and/or decision may be based on factors not within their control, such as  incomplete or inaccurate data provided by me or distortions of diagnostic images or specimens that may result from  electronic transmissions. I understand that the practice of medicine is not an exact science and that Practitioner makes no warranties or guarantees regarding treatment outcomes. I acknowledge that I will receive a copy of this consent concurrently upon execution via email to the email address I last provided but may also request a printed copy by calling the office of [facility].    I understand that my insurance will be billed for this visit.   I have read or had this consent read to me. . I understand the contents of this consent, which adequately explains the benefits and risks of the Services being provided via telemedicine.  . I have been provided ample opportunity to ask questions regarding this consent and the Services and have had my questions answered to my satisfaction. . I give my informed consent for the services to be provided through the use of telemedicine in my medical care  By participating in this telemedicine visit I agree to the above.

## 2019-03-12 NOTE — Progress Notes (Signed)
Telephone Visit     Evaluation Performed:  Follow-up visit  This visit type was conducted due to national recommendations for restrictions regarding the COVID-19 Pandemic (e.g. social distancing).  This format is felt to be most appropriate for this patient at this time.  All issues noted in this document were discussed and addressed.  No physical exam was performed (except for noted visual exam findings with Telehealth visits).  See MyChart message from today for the patient's consent to telehealth for [facility].  Date:  03/12/2019   ID:  Jesus Hendrix, DOB 04-Sep-1946, MRN [id number]  Patient Location:  [address] [place] [address]   Provider location:   [facility], [place] office  PCP:  Patient, No Pcp Per  Cardiologist:  No primary care provider on file.   Chief Complaint:  Back     History of Present Illness:    Jesus Hendrix is a 73 y.o. male who presents via audio/video conferencing for a telehealth visit today.   The patient does not symptoms concerning for COVID-19 infection (fever, chills, cough, or new SHORTNESS OF BREATH).  Please refer to prior office visit for complete details: Patient has a past medical history of  Persistent atrial fibrillation chronic back pain and sciatica down his left leg,  coronary artery disease and bypass surgery in 2007,  hyperlipidemia,  obesity,  kidney cancer,  status post laser ablation at [facility], ANCA associated vasculitis, Wegener's granulomatosis,  previous history of dizziness which he attributes to his Wegener's followed by [facility] nephrology who presents for discussion of his coronary artery disease, atrial fibrillation, heart block with pacer  Last clinic visit,  heart rate in the 30s, transferred to [facility],  Pacer placed for complete heart block  switch to eliquis  Chronic severe back pain, walking with a limp Till working, Data processing manager, foundations and sewage Followed by  Dr. 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 Known atrophic left kidney.    Prior CV studies:   The following studies were reviewed today:  Echo 05/2016 Left ventricle: The cavity size was normal. Systolic function was   mildly to moderately reduced. The estimated ejection fraction was   in the range of 40% to 45%. Hypokinesis of the anteroseptal   myocardium. Hypokinesis of the anterior myocardium. The study is   not technically sufficient to allow evaluation of LV diastolic   function. - Aortic valve: There was very mild stenosis. Mean gradient (S): 11   mm Hg. Peak gradient (S): 23 mm Hg. Valve area (VTI): 1.35 cm^2. - Mitral valve: There was mild regurgitation. - Left atrium: The atrium was moderately dilated. - Pulmonary arteries: Systolic pressure was within the normal   range.  Past Medical History:  Diagnosis Date  . Anxiety   . Aortic stenosis    a. mild on echo 04/2014  . Atrial fibrillation ([place]) 06/06/2016   a. newly diagnosed 06/06/2016; b. CHADS2VASc => 5 (CHF, HTN, age x 1, DM, vascular disease)  . Choledocholithiasis   . Chronic kidney disease   . Chronic systolic CHF (congestive heart failure) ([place])  a. echo 04/2014: EF 45-50%, nl LV dia fxn, mild AS, LA mildly dilated, PASP nl  . COPD (chronic obstructive pulmonary disease) ([place])    Not on home oxygen  . Coronary artery disease    a. status post CABG in 2007; b. nuc stress test 2014: small region of mild ischemia in the inferior territory, EF 61%, no ischemia, scan similar to prior scan in 2009  . Exogenous obesity   . GERD (gastroesophageal reflux  disease)   . Gout   . Hiatal hernia   . Hip pain, chronic 08/03/2016   Waiting to have hip replacement pending NM stress test results per patient.  . History of colonic diverticulitis   . History of renal cell carcinoma    a. s/p laser ablation   . Hyperlipidemia   . Hypertension   . Near syncope    a. 3 separate events in 2017  . Nephrolithiasis   . Panic attacks   . Spinal stenosis   . Symptomatic bradycardia    s/p [employer] Quarda Allure MP RF model [place] [id number] (serial  Number [id number]) biventricular pacemaker. 04/05/18  . Temporomandibular joint pain dysfunction syndrome   . Umbilical hernia   . Vitamin D deficiency   . Wegener's granulomatosis with renal involvement [facility])    Past Surgical History:  Procedure Laterality Date  . CARDIAC CATHETERIZATION    . CHOLECYSTECTOMY    . COLONOSCOPY N/A 06/03/2015   Procedure: COLONOSCOPY;  Surgeon: Lucilla Lame, MD;  Location: [facility];  Service: Gastroenterology;  Laterality: N/A;  . CORONARY ARTERY BYPASS GRAFT  2004   CABG x 3  . EYE SURGERY    . HERNIA REPAIR     x 2  . HIP SURGERY     right hip replacement  . INTRAOCULAR LENS INSERTION    . KIDNEY SURGERY     cancer  . NOSE SURGERY    . PACEMAKER IMPLANT N/A 04/05/2018   SJM Biventricular pacemaker implanted by Dr Rayann Heman for second degree AV block, EF < 50% and permanent afib  . POLYPECTOMY  06/03/2015   Procedure: POLYPECTOMY;  Surgeon: Lucilla Lame, MD;  Location: [place];  Service: Gastroenterology;;  . PROSTATE SURGERY    . TOTAL HIP ARTHROPLASTY Left 12/19/2016   Procedure: LEFT TOTAL HIP ARTHROPLASTY;  Surgeon: Garald Balding, MD;  Location: [place];  Service: Orthopedics;  Laterality: Left;     No outpatient medications have been marked as taking for the 03/12/19 encounter (Appointment) with Minna Merritts, MD.     Allergies:   Clonazepam   Social History   Tobacco Use  . Smoking status: Former Smoker    Packs/day: 0.50    Years:  25.00    Pack years: 12.50    Types: Cigarettes    Last attempt to quit: 12/29/1974    Years since quitting: 44.2  . Smokeless tobacco: Never Used  Substance Use Topics  . Alcohol use: No    Alcohol/week: 2.0 standard drinks    Types: 2 Cans of beer per week    Comment: social  . Drug use: No     Family Hx: The patient's family history includes Heart disease in his brother, mother, and sister.  ROS:   Please see the history of present illness.    Review of Systems  Constitutional: Negative.   Respiratory: Negative.   Cardiovascular: Negative.   Gastrointestinal: Negative.   Musculoskeletal: Positive for back pain and joint pain.  Neurological: Negative.  Psychiatric/Behavioral: Negative.   All other systems reviewed and are negative.     Labs/Other Tests and Data Reviewed:    Recent Labs: 04/04/2018: ALT 35; TSH 2.386 04/06/2018: BUN 28; Creatinine, Ser 1.62; Hemoglobin 11.6; Platelets 218; Potassium 4.5; Sodium 136   Recent Lipid Panel Lab Results  Component Value Date/Time   CHOL 132 04/05/2018 02:36 AM   TRIG 58 04/05/2018 02:36 AM   HDL 29 (L) 04/05/2018 02:36 AM   CHOLHDL 4.6 04/05/2018 02:36 AM   LDLCALC 91 04/05/2018 02:36 AM    Wt Readings from Last 3 Encounters:  01/30/19 216 lb (98 kg)  07/15/18 221 lb (100.2 kg)  05/06/18 221 lb (100.2 kg)     Exam:    Vital Signs:  There were no vitals taken for this visit.   Well nourished, well developed male in no acute distress.   ASSESSMENT & PLAN:     Chronic diastolic heart failure (HCC) Euvolemic by his report, In fact is feeling dry, dry mouth with lots of muscle cramps Recommend he try to do some stretches without his Lasix Denies having any leg swelling concerning for heart failure  Coronary artery disease of native artery of native heart with stable angina pectoris (HCC) Currently with no symptoms of angina. No further workup at this time. Continue current medication regimen.  Complete  heart block (HCC) Pacer, checked on a monthly basis Annual visit with Dr. 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  The importance of social distancing was discussed today.  Patient Risk:   After full review of this patients clinical status, I feel that they are at least moderate risk at this time.  Time:   Today, I have spent 25 minutes with the patient with telehealth technology discussing long discussion concerning his shortness of breath symptoms, previous anginal symptoms, management of his cholesterol, management of his chronic back pain Discussed pacemaker monitoring.     Medication Adjustments/Labs and Tests Ordered: Current medicines are reviewed at length with the patient today.  Concerns regarding medicines are outlined above.   Tests Ordered: No tests ordered   Medication Changes: No changes made   Disposition: Follow-up in 6 months   Signed, Ida Rogue, MD  03/12/2019 3:52 PM    [facility] [address], [place], [place] [address]

## 2019-03-12 NOTE — Telephone Encounter (Signed)
Refill sent to patient pharmacy.   Patient is also overdue for follow up. Please call patient and set up TeleHealth visit with patient.

## 2019-03-12 NOTE — Telephone Encounter (Signed)
Spoke with patient a Telephone visit scheduled.

## 2019-03-12 NOTE — Telephone Encounter (Addendum)
Please send in an RX for patient for Eliquis 5 mg one tablet twice a day to [facility]. He had previously received this through patient assistance.  He now has new insurance that covers this medication, so he no longer qualifies for this assistance.

## 2019-03-12 NOTE — Patient Instructions (Signed)

## 2019-03-12 NOTE — Telephone Encounter (Signed)
Unable to reach patient no answer no voice mail.

## 2019-03-17 ENCOUNTER — Other Ambulatory Visit: Payer: Self-pay

## 2019-03-17 ENCOUNTER — Ambulatory Visit (INDEPENDENT_AMBULATORY_CARE_PROVIDER_SITE_OTHER): Payer: Medicare Other

## 2019-03-17 DIAGNOSIS — Z950 Presence of cardiac pacemaker: Secondary | ICD-10-CM | POA: Diagnosis not present

## 2019-03-17 DIAGNOSIS — I5032 Chronic diastolic (congestive) heart failure: Secondary | ICD-10-CM | POA: Diagnosis not present

## 2019-03-19 ENCOUNTER — Institutional Professional Consult (permissible substitution): Admit: 2019-03-19 | Discharge: 2019-03-20 | Payer: MEDICARE

## 2019-03-19 DIAGNOSIS — I10 Essential (primary) hypertension: Secondary | ICD-10-CM

## 2019-03-19 DIAGNOSIS — N184 Chronic kidney disease, stage 4 (severe): Secondary | ICD-10-CM

## 2019-03-19 DIAGNOSIS — C641 Malignant neoplasm of right kidney, except renal pelvis: Secondary | ICD-10-CM

## 2019-03-19 DIAGNOSIS — D899 Disorder involving the immune mechanism, unspecified: Secondary | ICD-10-CM

## 2019-03-19 DIAGNOSIS — I776 Arteritis, unspecified: Principal | ICD-10-CM

## 2019-03-19 NOTE — Progress Notes (Signed)
EPIC Encounter for ICM Monitoring  Patient Name: DETRON CARRAS is a 73 y.o. male Date: 03/19/2019 Primary Care Physican: Patient, No Pcp Per Primary Cardiologist:Gollan Electrophysiologist:Allred 02/10/2019 Weight:216lbs    Spoke with patient.  He has increased fluid intake due to nightly muscle cramps and was advised to cut back on taking Lasix.  He is taking Lasix every other day.  Advised to limit salt intake since Lasix dosage was reduced.    Thoracic impedance normal but was abnormal suggesting fluid accumulation from 03/07/2019 -03/13/2019.  Prescribed:Furosemide20 mg take 1 tablet by mouth twice a day as needed for fluid.Taking Differently: Taking 1 tablet every other day.  Recommendations:Advised to limit salt intake and reviewed HF symptoms.  Advised if he has any symptoms to call.   Follow-up plan: ICM clinic phone appointment on5/11/2019.   Copy of ICM check sent to Dr.Allred.  3 month ICM trend: 03/17/2019    1 Year ICM trend:       [name], RN 03/19/2019 1:55 PM

## 2019-03-21 ENCOUNTER — Ambulatory Visit: Admit: 2019-03-21 | Discharge: 2019-03-21 | Payer: MEDICARE

## 2019-03-21 DIAGNOSIS — N184 Chronic kidney disease, stage 4 (severe): Secondary | ICD-10-CM

## 2019-03-21 DIAGNOSIS — I776 Arteritis, unspecified: Principal | ICD-10-CM

## 2019-03-21 DIAGNOSIS — N29 Other disorders of kidney and ureter in diseases classified elsewhere: Principal | ICD-10-CM

## 2019-03-26 MED ORDER — CIPROFLOXACIN 500 MG TABLET
ORAL_TABLET | Freq: Every day | ORAL | 0 refills | 0.00000 days | Status: CP
Start: 2019-03-26 — End: 2019-06-14

## 2019-04-10 IMAGING — CR DG CHEST 2V
2 series · 2 of 2 positions shown · non-contrast
Comparison: PA and lateral chest 12/19/2016.

CLINICAL DATA: Status post pacemaker placement 04/05/2018.

EXAM:
CHEST - 2 VIEW

[chest pa]
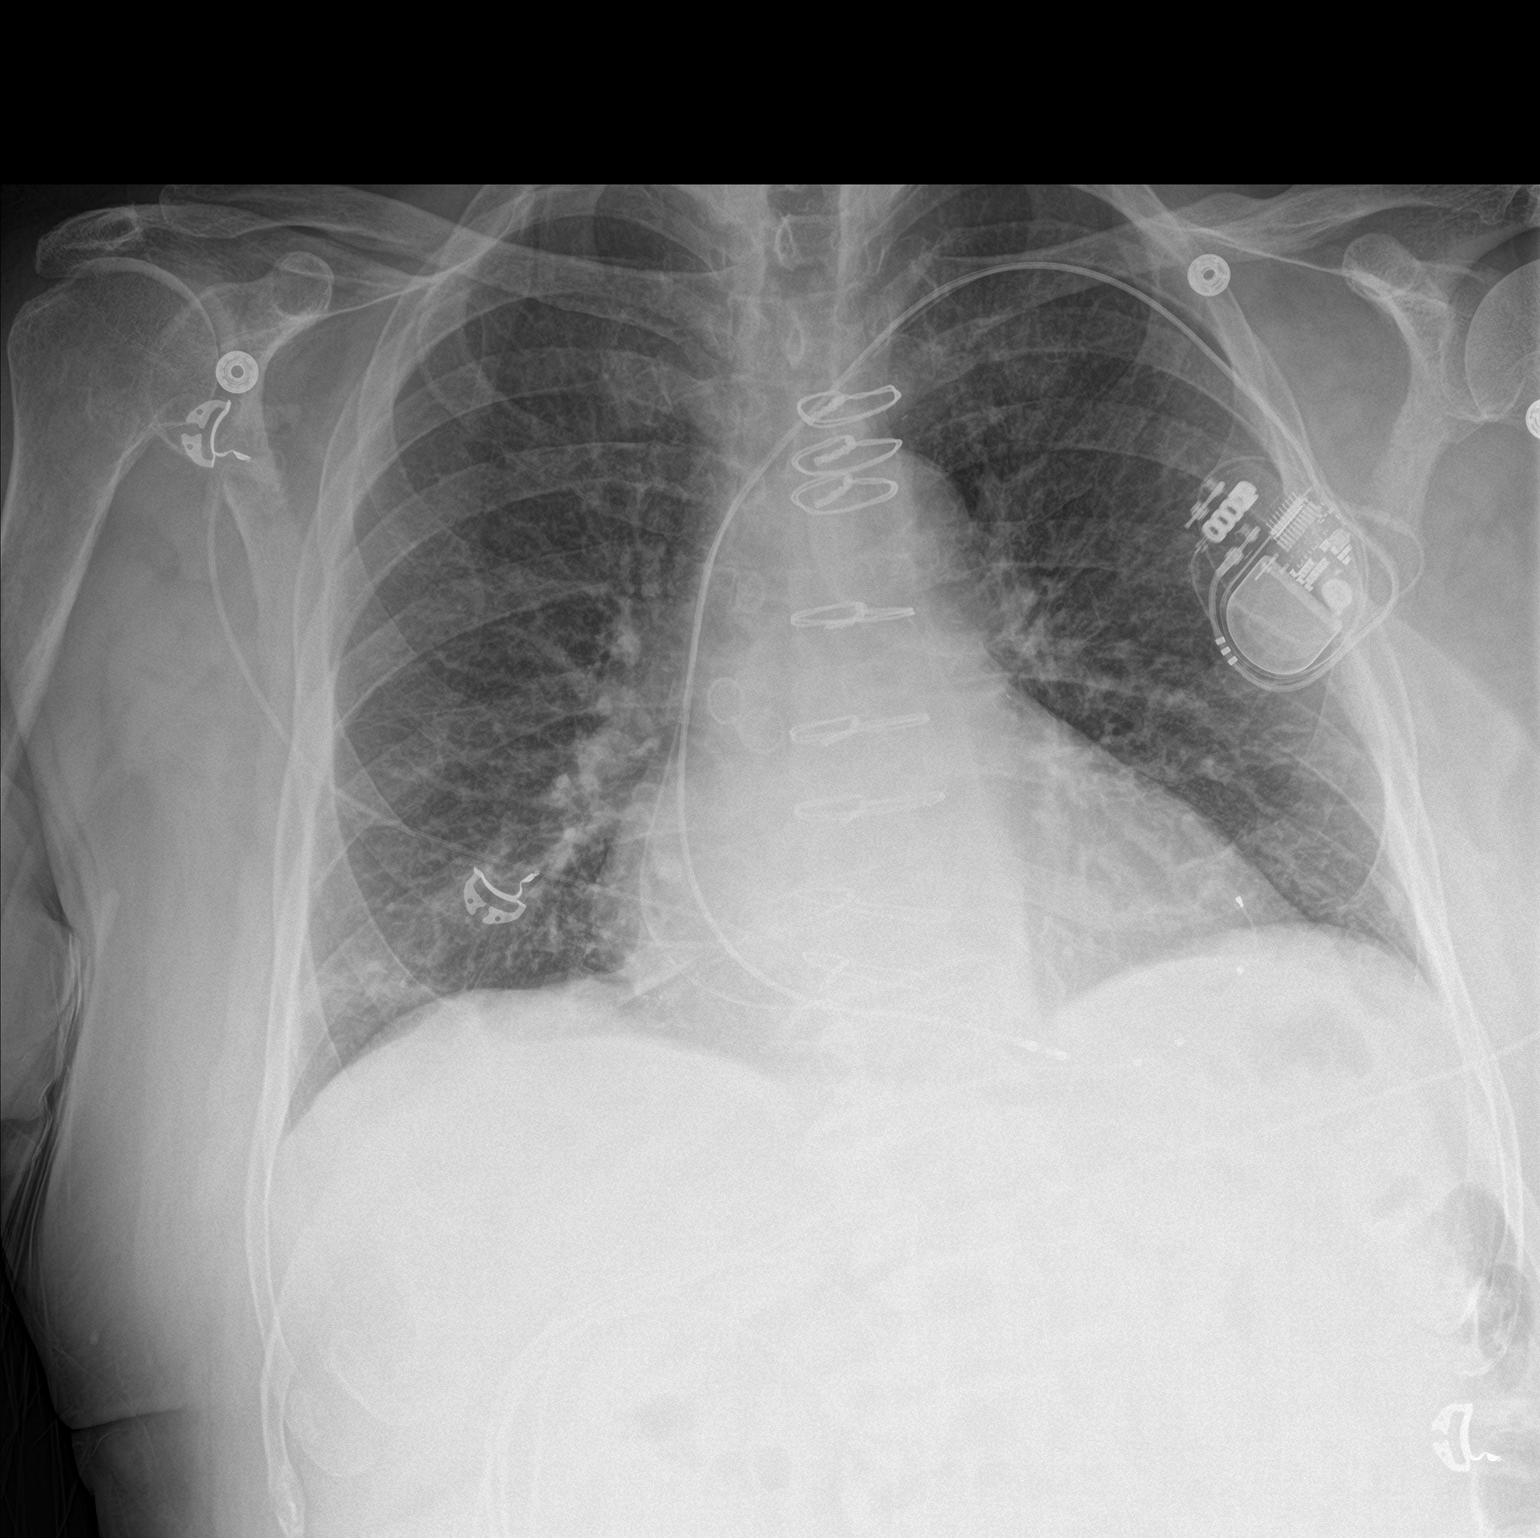

[chest lat]
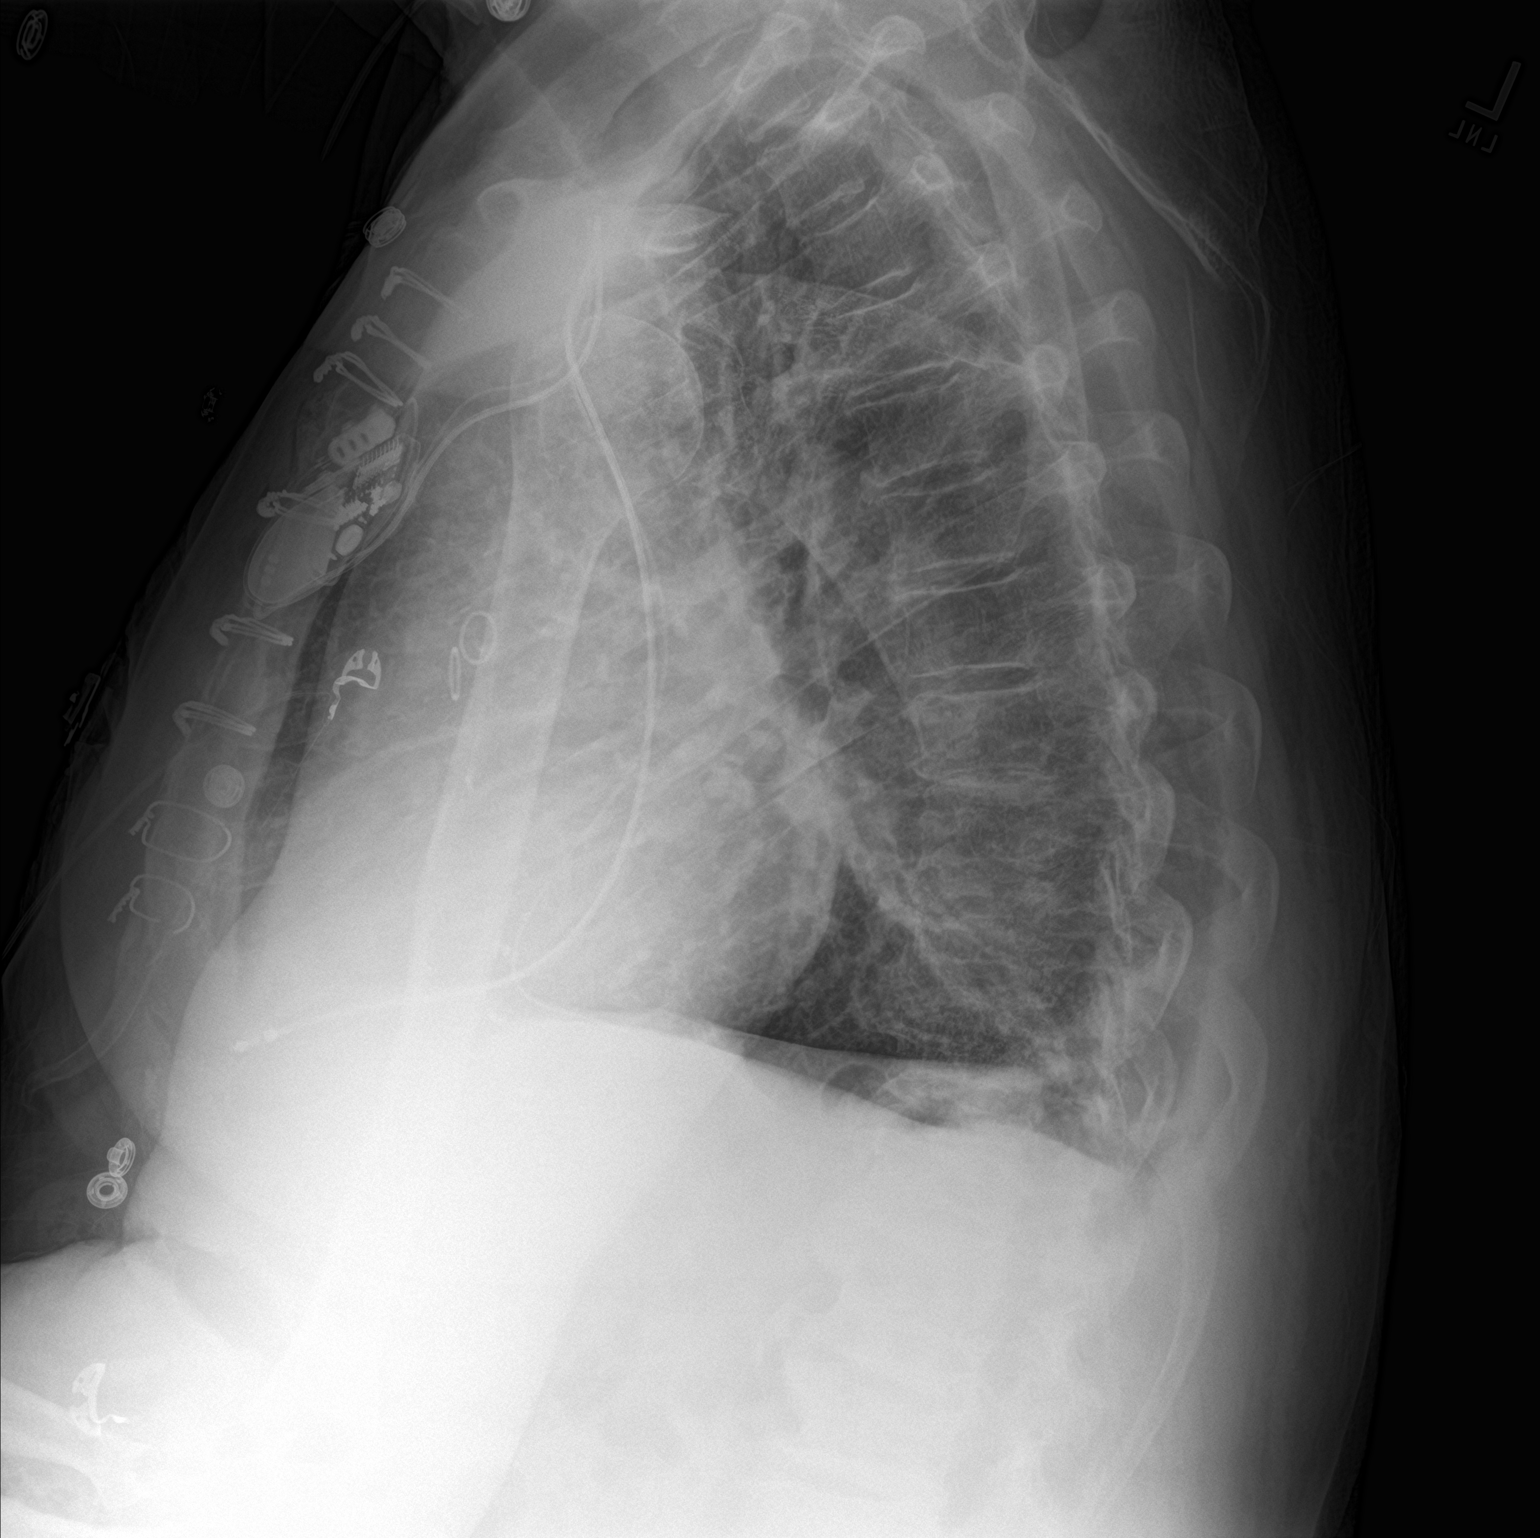

[2 of 2 positions shown; findings below may reference images not displayed]

FINDINGS: Two lead pacing device is in place with tips of the leads in the
right ventricle and left ventricle. Leads are intact. Generator is
in the mid left chest. There is mild bibasilar atelectasis. No
pneumothorax. The patient is status post CABG. Mild cardiomegaly is
seen. Trace pleural effusions noted. No acute bony abnormality.
IMPRESSION: New pacemaking device in place with and left ventricle leads in the
right ventricle. Negative for pneumothorax.

Mild cardiomegaly.

## 2019-04-11 ENCOUNTER — Ambulatory Visit: Admit: 2019-04-11 | Discharge: 2019-04-12 | Payer: MEDICARE

## 2019-04-11 DIAGNOSIS — R109 Unspecified abdominal pain: Principal | ICD-10-CM

## 2019-04-30 ENCOUNTER — Ambulatory Visit (INDEPENDENT_AMBULATORY_CARE_PROVIDER_SITE_OTHER): Payer: Medicare Other

## 2019-04-30 ENCOUNTER — Other Ambulatory Visit: Payer: Self-pay

## 2019-04-30 DIAGNOSIS — I5032 Chronic diastolic (congestive) heart failure: Secondary | ICD-10-CM | POA: Diagnosis not present

## 2019-04-30 DIAGNOSIS — Z950 Presence of cardiac pacemaker: Secondary | ICD-10-CM

## 2019-05-01 ENCOUNTER — Other Ambulatory Visit: Payer: Self-pay

## 2019-05-01 ENCOUNTER — Ambulatory Visit (INDEPENDENT_AMBULATORY_CARE_PROVIDER_SITE_OTHER): Payer: Medicare Other | Admitting: *Deleted

## 2019-05-01 DIAGNOSIS — I442 Atrioventricular block, complete: Secondary | ICD-10-CM

## 2019-05-02 NOTE — Progress Notes (Signed)
EPIC Encounter for ICM Monitoring  Patient Name: Jesus Hendrix is a 73 y.o. male Date: 05/02/2019 Primary Care Physican: Patient, No Pcp Per Primary Cardiologist:Gollan Electrophysiologist:Allred 02/10/2019 Weight:216lbs    Transmission reviewed.    CorVue Thoracic impedance trending just below baseline normal.  Prescribed:Furosemide20 mg take 1 tablet by mouth twice a day as needed for fluid.  Recommendations: None   Follow-up plan: ICM clinic phone appointment on6/16/2020.   Copy of ICM check sent to Dr.Allred.  3 month ICM trend: 04/30/2019    1 Year ICM trend:       [name], RN 05/02/2019 4:05 PM

## 2019-05-05 LAB — CUP PACEART REMOTE DEVICE CHECK
Date Time Interrogation Session: 20200518165822
Implantable Lead Implant Date: 20190419
Implantable Lead Implant Date: 20190419
Implantable Lead Location: 753858
Implantable Lead Location: 753860
Implantable Pulse Generator Implant Date: 20190419
Pulse Gen Model: 3262
Pulse Gen Serial Number: 9017264

## 2019-05-07 ENCOUNTER — Encounter: Payer: Self-pay | Admitting: Cardiology

## 2019-05-07 NOTE — Progress Notes (Signed)
Remote pacemaker transmission.   

## 2019-05-08 ENCOUNTER — Telehealth: Payer: Self-pay | Admitting: Cardiovascular Disease

## 2019-05-08 NOTE — Telephone Encounter (Signed)
Patient with diagnosis of atrial fibrillation on Eliquis for anticoagulation.    Procedure: uretascopy Date of procedure: 05/13/2019  CHADS2-VASc score of  4 (CHF, HTN, AGE,  CAD,   CrCl 36.2 Platelet count 270  Per office protocol, patient can hold Eliquis for 3 days prior to procedure.

## 2019-05-08 NOTE — Telephone Encounter (Signed)
   [facility] Pre-operative Risk Assessment    Request for surgical clearance:  1. What type of surgery is being performed? URETAOSCOPY   2. When is this surgery scheduled? 05/13/2019  3. What type of clearance is required (medical clearance vs. Pharmacy clearance to hold med vs. Both)? PHARMACYY  4. Are there any medications that need to be held prior to surgery and how long? ELIQUIS 3 DAYS  5. Practice name and name of physician performing surgery? DR [facility]  6. What is your office phone number RESPOND TO [place] NUMBER-DR [place]) [phone],    7.   What is your office fax number  8.   Anesthesia type (None, local, MAC, general) ? GENERAL  Jesus Hendrix 05/08/2019, 3:18 PM  _________________________________________________________________   (provider comments below)

## 2019-05-08 NOTE — Telephone Encounter (Signed)
   Primary Cardiologist: Ida Rogue, MD  Chart reviewed as part of pre-operative protocol coverage. Per pharmacy recommendations, patient can hold eliquis for 3 days prior to his upcoming urologic procedure. He should restart eliquis as soon as he is cleared to do soby Dr. Thurmond Butts.  I will route this recommendation to the requesting party via Epic fax function and remove from pre-op pool.  Please call with questions.  Abigail Butts, PA-C 05/08/2019, 5:28 PM

## 2019-05-09 DIAGNOSIS — C679 Malignant neoplasm of bladder, unspecified: Principal | ICD-10-CM

## 2019-05-10 ENCOUNTER — Ambulatory Visit: Admit: 2019-05-10 | Discharge: 2019-05-11 | Payer: MEDICARE | Attending: Family | Primary: Family

## 2019-05-10 DIAGNOSIS — Z1159 Encounter for screening for other viral diseases: Principal | ICD-10-CM

## 2019-05-13 ENCOUNTER — Ambulatory Visit: Admit: 2019-05-13 | Discharge: 2019-05-13 | Payer: MEDICARE

## 2019-05-13 ENCOUNTER — Encounter: Admit: 2019-05-13 | Discharge: 2019-05-13 | Payer: MEDICARE | Attending: Anesthesiology | Primary: Anesthesiology

## 2019-05-13 DIAGNOSIS — C679 Malignant neoplasm of bladder, unspecified: Principal | ICD-10-CM

## 2019-05-15 MED ORDER — APIXABAN 5 MG TABLET
Freq: Two times a day (BID) | ORAL | 0 refills | 0 days
Start: 2019-05-15 — End: 2019-06-25

## 2019-06-03 ENCOUNTER — Ambulatory Visit (INDEPENDENT_AMBULATORY_CARE_PROVIDER_SITE_OTHER): Payer: Medicare Other

## 2019-06-03 DIAGNOSIS — I5032 Chronic diastolic (congestive) heart failure: Secondary | ICD-10-CM | POA: Diagnosis not present

## 2019-06-03 DIAGNOSIS — Z950 Presence of cardiac pacemaker: Secondary | ICD-10-CM

## 2019-06-04 NOTE — Progress Notes (Signed)
EPIC Encounter for ICM Monitoring  Patient Name: Jesus Hendrix is a 73 y.o. male Date: 06/04/2019 Primary Care Physican: Patient, No Pcp Per Primary Cardiologist:Gollan Electrophysiologist:Allred 2/24/2020Weight:216lbs    Transmission reviewed.  CorVue Thoracic impedance normal.  Prescribed:Furosemide20 mg take 1 tablet by mouth twice a day as needed for fluid.  Recommendations: None   Follow-up plan: ICM clinic phone appointment on7/20/2020.   Copy of ICM check sent to Dr.Allred.  3 month ICM trend: 06/03/2019    1 Year ICM trend:       [name], RN 06/04/2019 12:09 PM

## 2019-06-14 ENCOUNTER — Ambulatory Visit
Admit: 2019-06-14 | Discharge: 2019-06-25 | Disposition: A | Discharge status: Home / Self Care | Payer: MEDICARE | Admitting: Nephrology

## 2019-06-14 DIAGNOSIS — R7989 Other specified abnormal findings of blood chemistry: Principal | ICD-10-CM

## 2019-06-14 NOTE — Unmapped (Addendum)
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     Hemoptysis, Hx of ANCA vasculitis (GPA)   Hemoptysis began the night of 6/26 with multiple episodes of hemoptysis (unclear of amount, as he was using a kleenex). Hgb on admission is decreased to 9 from baseline of 11 in April. Hx of cyclophosphomide, rituximab, cellcept, but no longer on any immunosuppression. Most recent treatment course was in 2011. CT Chest non contrast on 6/27 demonstrated bilateral R>L upper lobe opacities with surrounding groundglass concerning for diffuse alveolar hemorrhage secondary to granulomatous with polyangitis (GPA). He was subsequently started on plasmapheresis and completed *** courses of plasmapheresis; one dose of rituximab on 7/3; solumedrol 50mg /kg x 2; 60mg  prednisone qd from 6/29-7/5. Currently on dapsone 100mg  qd and 40mg  prednisone qd with plan for at-home prednisone taper.     -- Originally admitted to the MICU      CKD in setting of GPA, with co-morbidities including HFrEF, urothelial cancer, BPH requiring self-cath. Previous baseline creatinine estimated to be 2.3-2.4, and he presents with creatinine of 3.89. Follows with [facility] (Dr. Jeralyn Ruths).  Creatine improved with immunosuppression and was 2.14 on day of discharge.     CAD s/p CABG (2005), HFrEF  EF 40-45% on TTE in 2017. ProBNP 6350 on admission, unknown baseline. He continued on home metoprolol ***. Losartan was stopped in the setting of planned apheresis, and eliquis was held as below.    Atrial fibrillation  Anticoagulated on Eliquis, held in the setting of hemoptysis.

## 2019-06-25 LAB — CREATININE: Creatinine:MCnc:Pt:Ser/Plas:Qn:: 2.14 — ABNORMAL HIGH

## 2019-06-25 LAB — MAGNESIUM: Magnesium:MCnc:Pt:Ser/Plas:Qn:: 2.1

## 2019-06-25 LAB — BASIC METABOLIC PANEL
ANION GAP: 8 mmol/L (ref 7–15)
BUN / CREAT RATIO: 28
CALCIUM: 8.5 mg/dL (ref 8.5–10.2)
CHLORIDE: 107 mmol/L (ref 98–107)
CO2: 25 mmol/L (ref 22.0–30.0)
EGFR CKD-EPI AA MALE: 34 mL/min/{1.73_m2} — ABNORMAL LOW (ref >=60)
EGFR CKD-EPI NON-AA MALE: 29 mL/min/{1.73_m2} — ABNORMAL LOW (ref >=60)
GLUCOSE RANDOM: 125 mg/dL (ref 70–179)
POTASSIUM: 5.7 mmol/L — ABNORMAL HIGH (ref 3.5–5.0)

## 2019-06-25 LAB — CBC
HEMATOCRIT: 26.3 % — ABNORMAL LOW (ref 41.0–53.0)
HEMOGLOBIN: 8.5 g/dL — ABNORMAL LOW (ref 13.5–17.5)
MEAN CORPUSCULAR HEMOGLOBIN CONC: 32.5 g/dL (ref 31.0–37.0)
MEAN CORPUSCULAR HEMOGLOBIN: 33.2 pg (ref 26.0–34.0)
MEAN PLATELET VOLUME: 8.7 fL (ref 7.0–10.0)
PLATELET COUNT: 231 10*9/L (ref 150–440)
RED CELL DISTRIBUTION WIDTH: 16 % — ABNORMAL HIGH (ref 12.0–15.0)
WBC ADJUSTED: 11.4 10*9/L — ABNORMAL HIGH (ref 4.5–11.0)

## 2019-06-25 LAB — MEAN CORPUSCULAR VOLUME: Lab: 102.1 — ABNORMAL HIGH

## 2019-06-25 LAB — PHOSPHORUS: Phosphate:MCnc:Pt:Ser/Plas:Qn:: 4.5

## 2019-06-25 MED ORDER — MYCOPHENOLATE MOFETIL 250 MG CAPSULE: 500 mg | capsule | Freq: Two times a day (BID) | 11 refills | 0 days | Status: AC

## 2019-06-25 MED ORDER — MYCOPHENOLATE MOFETIL 250 MG CAPSULE
ORAL_CAPSULE | Freq: Two times a day (BID) | ORAL | 11 refills | 0.00000 days | Status: CP
Start: 2019-06-25 — End: 2019-06-25

## 2019-06-25 MED ORDER — FUROSEMIDE 20 MG TABLET
ORAL_TABLET | Freq: Every day | ORAL | 0 refills | 0 days | Status: CP
Start: 2019-06-25 — End: 2019-07-17
  Filled 2019-06-25: qty 30, 30d supply, fill #0

## 2019-06-25 MED ORDER — AMLODIPINE 10 MG TABLET
ORAL_TABLET | Freq: Every evening | ORAL | 3 refills | 0.00000 days | Status: CP
Start: 2019-06-25 — End: 2020-06-24
  Filled 2019-06-25: qty 90, 90d supply, fill #0

## 2019-06-25 MED ORDER — DAPSONE 100 MG TABLET
ORAL_TABLET | Freq: Every day | ORAL | 2 refills | 0.00000 days | Status: CP
Start: 2019-06-25 — End: 2019-07-17
  Filled 2019-06-25: qty 30, 30d supply, fill #0

## 2019-06-25 MED FILL — FUROSEMIDE 20 MG TABLET: 30 days supply | Qty: 30 | Fill #0 | Status: AC

## 2019-06-25 MED FILL — AMLODIPINE 10 MG TABLET: 90 days supply | Qty: 90 | Fill #0 | Status: AC

## 2019-06-25 MED FILL — DAPSONE 100 MG TABLET: 30 days supply | Qty: 30 | Fill #0 | Status: AC

## 2019-06-25 MED FILL — PREDNISONE 20 MG TABLET: 30 days supply | Qty: 60 | Fill #0 | Status: AC

## 2019-06-25 NOTE — Unmapped (Signed)
Per test claim for MYCOPHENOLATE at the [facility], patient needs Medication Assistance Program for Prior Authorization.

## 2019-06-25 NOTE — Unmapped (Signed)
Physician Discharge Summary    Admit date: 06/14/2019    Discharge date and time: 06/25/2019    Discharge to: Home    Discharge Service: Nephrology (MDB)    Discharge Attending Physician: No att. providers found    Discharge Diagnoses: Hemoptysis c/f DAH - Hx of ANCA vasculitis (GPA), CKD, CAD s/p CABG (2005) - hx of HFrEF,     Procedures: internal jugular line    Pertinent Test Results:     Hospital Course:    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   ??  Hemoptysis, Hx of ANCA vasculitis ([place]PA)   Hemoptysis began the night of 6/26 with multiple episodes of hemoptysis (unclear of amount, as he was using a kleenex). Hgb on admission is decreased to 9 from baseline of 11 in April. Hx of cyclophosphomide, rituximab, cellcept, but no longer on any immunosuppression. Most recent treatment course was in 2011. CT Chest non contrast on 6/27 demonstrated bilateral R>L upper lobe opacities with surrounding groundglass concerning for diffuse alveolar hemorrhage secondary to granulomatous with polyangitis (GPA). He was subsequently started on multiple courses of plasmapheresis; one dose of rituximab on 7/3; solumedrol 50mg /kg x 2; 60mg  prednisone qd from 6/29-7/5. Currently on dapsone 100mg  qd and 40mg  prednisone qd with plan for at-home prednisone taper.   ??  -- Originally admitted to the MICU  ??  ??  CKD in setting of GPA, with co-morbidities including HFrEF, urothelial cancer, BPH requiring self-cath. Previous baseline creatinine estimated to be 2.3-2.4, and he presents with creatinine of 3.89. Follows with [facility] Nephrology (Dr. Jeralyn Ruths).  Creatine improved with immunosuppression and was 2.14 on day of discharge.   ??  CAD s/p CABG (2005), HFrEF  EF 40-45% on TTE in 2017. ProBNP 6350 on admission, unknown baseline.  Losartan was stopped in the setting of planned plamapheresis, and eliquis was held.  ??  Atrial fibrillation  Anticoagulated on Eliquis, held in the setting of hemoptysis.  ??            Condition at Discharge: good  Discharge Medications:      Your Medication List      STOP taking these medications    apixaban 5 mg Tab  Commonly known as:  ELIQUIS     losartan 25 MG tablet  Commonly known as:  COZAAR        START taking these medications    amLODIPine 10 MG tablet  Commonly known as:  NORVASC  Take 1 tablet (10 mg total) by mouth nightly.     dapsone 100 MG tablet  Take 1 tablet (100 mg total) by mouth daily.     mycophenolate 250 mg capsule  Commonly known as:  CELLCEPT  Take 2 capsules (500 mg total) by mouth two (2) times a day.     predniSONE 20 MG tablet  Commonly known as:  DELTASONE  Take 2 tablets (40 mg total) by mouth daily.  Start taking on:  June 26, 2019        CHANGE how you take these medications    furosemide 20 MG tablet  Commonly known as:  LASIX  Take 1 tablet (20 mg total) by mouth daily.  What changed:    ?? when to take this  ?? additional instructions  CONTINUE taking these medications    acetaminophen 325 MG tablet  Commonly known as:  TYLENOL  Take 650 mg by mouth every six (6) hours as needed for pain.     atorvastatin 20 MG tablet  Commonly known as:  LIPITOR  Take 10 mg by mouth daily. (patient takes once every 2-3 days)     cholecalciferol 1,000 unit (25 mcg) tablet  Generic drug:  cholecalciferol (vitamin D3)  Take 1,000 Units by mouth daily.     clonazePAM 1 MG tablet  Commonly known as:  KlonoPIN  Take 1 mg by mouth nightly as needed for anxiety or sleep.     fluticasone propionate 50 mcg/actuation nasal spray  Commonly known as:  FLONASE  1 spray by Each Nare route daily.     niacin 500 MG tablet  Take 500 mg by mouth daily with breakfast.     pantoprazole 40 MG tablet  Commonly known as:  PROTONIX  Take 1 tablet (40 mg total) by mouth daily.     tamsulosin 0.4 mg capsule  Commonly known as:  FLOMAX  Take 1 capsule (0.4 mg total) by mouth daily.                 Discharge Instructions:   Activity Instructions     Activity as tolerated          Other Instructions     Call MD for:  difficulty breathing, headache or visual disturbances      Call MD for:  persistent nausea or vomiting      Call MD for:  severe uncontrolled pain      Call MD for:  temperature >38.5 Celsius      Discharge instructions      You were admitted to [facility] with ANCA+ vasculitis that was treated with plasmapheresis, steroids, ritux, and cellcept    Please take your medications as prescribed. Medication changes are listed below and a full list of medications is in this discharge packet. Please keep your follow-up appointments after the hospital for ongoing care. It has been a pleasure taking care of you, we wish you the best.     Continue to take your steroids after leaving the hospital as they are prescribed.    MEDICATION CHANGES:  -- DECREASE lasix to once daily  -- START prednisone 40mg  daily. Your nephrologist will follow up regarding the duration of this medication and tapering schedule.  -- START cellcept 500mg  (2 tablets) twice a day  -- START dapsone 100mg  daily  -- STOP losartan  -- STOP elliquis    FOLLOW-UP:  -- One week with PCP  -- Appointment with cardiology on 7/20  -- Virtual visit with nephrology on 7/15 at 9am.             Follow Up instructions and Outpatient Referrals     Call MD for:  difficulty breathing, headache or visual disturbances      Call MD for:  persistent nausea or vomiting      Call MD for:  severe uncontrolled pain      Call MD for:  temperature >38.5 Celsius      Discharge instructions          Appointments which have been scheduled for you    Jul 02, 2019  9:00 AM EDT  (Arrive by 8:45 AM)  VIDEO VISIT- OTHER with Lenore Manner Denu-Ciocca, MD  [facility] [place] ([place]) [address]  [place] [address]  [phone]      Jul 17, 2019  8:30 AM EDT  (Arrive by 8:15 AM)  RETURN  GENERAL with Lenore Manner Denu-Ciocca, MD  [facility] [place] [facility]) [address] [place] [address]  [phone]              I spent less than 30 minutes in the discharge of this patient.

## 2019-06-25 NOTE — Unmapped (Signed)
Discharge teaching given all questions answered-medication from [facility] care pharmacy team delivered. CVD right neck discontinued transparent dressing in place no bleeding nor discomfort reported.  Problem: Fall Injury Risk  Goal: Absence of Fall and Fall-Related Injury  Outcome: Discharged to Home     Problem: Skin Injury Risk Increased  Goal: Skin Health and Integrity  Outcome: Discharged to Home     Problem: Adult Inpatient Plan of Care  Goal: Plan of Care Review  Outcome: Discharged to Home  Goal: Patient-Specific Goal (Individualization)  Outcome: Discharged to Home  Goal: Absence of Hospital-Acquired Illness or Injury  Outcome: Discharged to Home  Goal: Optimal Comfort and Wellbeing  Outcome: Discharged to Home  Goal: Readiness for Transition of Care  Outcome: Discharged to Home  Goal: Rounds/Family Conference  Outcome: Discharged to Home     Problem: Self-Care Deficit  Goal: Improved Ability to Complete Activities of Daily Living  Outcome: Discharged to Home     Problem: Wound  Goal: Optimal Wound Healing  Outcome: Discharged to Home     Problem: Heart Failure Comorbidity  Goal: Maintenance of Heart Failure Symptom Control  Outcome: Discharged to Home     Problem: Fluid Volume Deficit  Goal: Fluid Balance  Outcome: Discharged to Home

## 2019-06-25 NOTE — Unmapped (Signed)
Tolerated procedure well, No complaints. Returned to inpatient unit via wheel chair.

## 2019-06-25 NOTE — Unmapped (Signed)
THERAPEUTIC PLASMA EXCHANGE  PROCEDURE NOTE    Indication: ANCA-associated vasculitis with DAH  ??  Therapy Plan: Daily TPE procedures with all plasma replacement until hemoptysis resolves, then every other day with first procedure being half albumin-half plasma replacement, then all albumin replacement for a total of at least 7 procedures.??  ??    Procedure #:   7/7    Labs:   HCT   Date Value Ref Range Status   06/25/2019 26.3 (L) 41.0 - 53.0 % Final   01/20/2015 43.0 41.0 - 53.0 % Final     Platelet   Date Value Ref Range Status   06/25/2019 231 150 - 440 10*9/L Final   01/20/2015 278 150 - 440 10*9/L Final       Premedications: None    Procedure:  Following a timeout, the patient was accessed by the treating RN and good return blood flow was achieved.  A 1.0 plasma volume Therapeutic Plasma Exchange was performed.    Replacement Fluid: 5% albumin  Calcium replacement: none  Complications: none  Fluid balance: +14 mL.      The procedure was well tolerated and the patient left the apheresis unit in good condition.  Therapeutic plasma exchange treatment series is complete.    For more details about this procedure, see the patient's encounter for this procedure, and go to the All Flowsheet Templates section and see the [facility] Plasma Exchange Treatment Flowsheet.    Amy Emilia Beck, MD  Pathology & Laboratory Medicine, PGY-4    Transfusion Medicine Attending Attestation:  I was present/available throughout the entire procedure. I reviewed the resident???s note. I agree with the resident???s findings.  For questions regarding the procedure, contact the Transfusion Medicine resident on call.    Clerance Lav, MD  Transfusion Medicine Service

## 2019-06-25 NOTE — Unmapped (Signed)
Pt denies any episodes of hemoptysis. VSS. For possible plex in the day time.pt self cath before sleep.    Problem: Fall Injury Risk  Goal: Absence of Fall and Fall-Related Injury  Outcome: Ongoing - Unchanged

## 2019-06-25 NOTE — Unmapped (Signed)
Patient arrived from inpatient unit for apheresis procedure. Access Right CVAD per SOP. Both lines flush / draw well.  No drainage, pain or redness noted at site.

## 2019-06-26 MED ORDER — PREDNISONE 20 MG TABLET
ORAL_TABLET | Freq: Every day | ORAL | 0 refills | 0.00000 days | Status: CP
Start: 2019-06-26 — End: 2019-07-17
  Filled 2019-06-25: qty 60, 30d supply, fill #0

## 2019-06-26 MED ORDER — MYCOPHENOLATE MOFETIL 250 MG CAPSULE
ORAL_CAPSULE | Freq: Two times a day (BID) | ORAL | 11 refills | 0 days | Status: CP
Start: 2019-06-26 — End: 2020-06-25

## 2019-07-03 ENCOUNTER — Institutional Professional Consult (permissible substitution): Admit: 2019-07-03 | Discharge: 2019-07-04 | Payer: MEDICARE

## 2019-07-03 DIAGNOSIS — I776 Arteritis, unspecified: Principal | ICD-10-CM

## 2019-07-03 DIAGNOSIS — C649 Malignant neoplasm of unspecified kidney, except renal pelvis: Secondary | ICD-10-CM

## 2019-07-03 DIAGNOSIS — N184 Chronic kidney disease, stage 4 (severe): Secondary | ICD-10-CM

## 2019-07-04 NOTE — Unmapped (Signed)
Nephrology   Virtual Encounter       Assessment:      Problem List     None          Nathaniel Ruiz is a 73 y.o. male (DOB: [birth year]) with  PMH of CKD, ANCA associated vasculitis, papillary cell carcinoma right collecting system and bladder dysfunction requiring CIC.     1. ANCA associated vasculitis - As above patient with recent pulmonary hemorrhage and AoCKI. The patient reports no further hemoptysis and at discharge renal function had improved. He has not yet gotten his 2nd infusion appointment. It has been approved via his insurance. Nephrology provider working on appt. We discussed continuing with prednisone 40 mg for now and cellcept. Will plan on decreasing prednisone at time of next infusion or 1 -2 weeks after he initiated therapy (7/15). Will plan to continue cellcept until 8 weeks after second rituximab infusion. Will reach out to pulmonary regarding recommendations/if pulmonary follow up ?      2. CV - Edema may be due to amlodipine. He is following up with cardiology next week. He has been off his eliquis since admission. He will contact me after his visit.      3. Urological - s/p recent cystoscopy negative pathology.        Plan:     1. Schedule rituximab  2. Continue with current immunosuppression  3. 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     Chief Complaint: Nathaniel Ruiz was evaluated via telephone/virtual visit s/p hospitalization.       History of Present Illness: [name] is a  73  year old man with ANCA associated vasculitis, CKD and bladder dysfunction who is evaluated via video visit. The patient was admitted to [facility] with pulmonary hemorrhage.  He underwent pulse solumedrol, PLEX and rituximab x 1. He has AoCKI with admission creatinine of 3.89 which improved to 2.14 upon discharge. Given his longstanding h/o steroid therapy and bone disease prednisone was tapered to 40 mg prior to discharge and cellcept was started to aide more rapid steroid taper.      The patient states he is doing okay. He needed a prior authorization for his cellcept which I completed and he states he is currently taking this and his prednisone therapy. He denies further hemoptysis and states his breathing has improved. He however does admit to continued fatigue and dyspnea on exertion. He denies fevers, chills or night sweats. He denies nausea, vomiting or abdominal pain. He continues to do CIC and denies dysuria, hematuria or cola colored urine.   He denies rash, generalized arthralgias or myalgias, oral ulcers, visual changes, decrease in hearing,  Or chest pain. He does note lower extremity edema.     ROS:   CONSTITUTIONAL: per HPI, denies unintentional weight loss,  fevers, chills and night sweats.   CARDIOVASCULAR: as per HPI  GASTROINTESTINAL: denies nausea, denies vomiting, denies anorexia  GENITOURINARY: per HPI  All systems reviewed and are negative except as listed above.        Past Medical History:   Diagnosis Date   ??? ANCA-associated vasculitis (CMS-HCC)    ??? Arthritis     osteoarthritis w/ radiculopathy left leg   ??? BPH (benign prostatic hypertrophy)    ???  CAD (coronary artery disease)     CBAG X 3 2005; + stress test, denies MI   ??? Cancer (CMS-HCC)     right papillary urothelial carcinoma, low grade    ??? GERD (gastroesophageal reflux disease)    ??? HTN (hypertension)    ??? Wegener's disease, pulmonary (CMS-HCC)           Current Outpatient Medications:   ???  acetaminophen (TYLENOL) 325 MG tablet, Take 650 mg by mouth every six (6) hours as needed for pain. , Disp: , Rfl:   ???  amLODIPine (NORVASC) 10 MG tablet, Take 1 tablet (10 mg total) by mouth nightly., Disp: 90 tablet, Rfl: 3  ???  atorvastatin (LIPITOR) 20 MG tablet, Take 10 mg by mouth daily. (patient takes once every 2-3 days), Disp: , Rfl:   ???  cholecalciferol, vitamin D3, (CHOLECALCIFEROL) 1,000 unit tablet, Take 1,000 Units by mouth daily. , Disp: , Rfl:   ???  clonazePAM (KLONOPIN) 1 MG tablet, Take 1 mg by mouth nightly as needed for anxiety or sleep. , Disp: , Rfl:   ???  dapsone 100 MG tablet, Take 1 tablet (100 mg total) by mouth daily., Disp: 30 tablet, Rfl: 2  ???  fluticasone (FLONASE) 50 mcg/actuation nasal spray, 1 spray by Each Nare route daily. , Disp: , Rfl:   ???  furosemide (LASIX) 20 MG tablet, Take 1 tablet (20 mg total) by mouth daily., Disp: 30 tablet, Rfl: 0  ???  mycophenolate (CELLCEPT) 250 mg capsule, Take 2 capsules (500 mg total) by mouth two (2) times a day., Disp: 120 capsule, Rfl: 11  ???  niacin 500 MG tablet, Take 500 mg by mouth daily with breakfast., Disp: , Rfl:   ???  pantoprazole (PROTONIX) 40 MG tablet, Take 1 tablet (40 mg total) by mouth daily., Disp: 90 tablet, Rfl: 3  ???  predniSONE (DELTASONE) 20 MG tablet, Take 2 tablets (40 mg total) by mouth daily., Disp: 60 tablet, Rfl: 0  ???  tamsulosin (FLOMAX) 0.4 mg capsule, Take 1 capsule (0.4 mg total) by mouth daily., Disp: 90 capsule, Rfl: 3     Objective Assessments If Available:     Physical examination could not be performed due to phone visit.      I spent 15 minutes on the phone with the patient. I spent an additional 20 minutes on pre- and post-visit activities.     The patient was physically located in [place] or a state in which I am permitted to provide care. The patient and/or parent/guardian understood that s/he may incur co-pays and cost sharing, and agreed to the telemedicine visit. The visit was reasonable and appropriate under the circumstances given the patient's presentation at the time.    The patient and/or parent/guardian has been advised of the potential risks and limitations of this mode of treatment (including, but not limited to, the absence of in-person examination) and has agreed to be treated using telemedicine. The patient's/patient's family's questions regarding telemedicine have been answered.     If the visit was completed in an ambulatory setting, the patient and/or parent/guardian has also been advised to contact their provider???s office for worsening conditions, and seek emergency medical treatment and/or call 911 if the patient deems either necessary.

## 2019-07-04 NOTE — Unmapped (Signed)
Spoke with pt and agreed with rituximab infusion July 22nd. Aware appt will be on mychart and can call to this number if has questions.

## 2019-07-07 ENCOUNTER — Ambulatory Visit (INDEPENDENT_AMBULATORY_CARE_PROVIDER_SITE_OTHER): Payer: Medicare Other

## 2019-07-07 DIAGNOSIS — I5032 Chronic diastolic (congestive) heart failure: Secondary | ICD-10-CM

## 2019-07-07 DIAGNOSIS — Z950 Presence of cardiac pacemaker: Secondary | ICD-10-CM

## 2019-07-08 ENCOUNTER — Telehealth: Payer: Self-pay | Admitting: Cardiovascular Disease

## 2019-07-08 NOTE — Telephone Encounter (Signed)
New Message    Pt says his legs and feet are swollen really bad. They took him off of the Eliquis at the ED     Please call

## 2019-07-08 NOTE — Telephone Encounter (Signed)
I spoke with the patient and his wife.  Per Mrs. Stopher, the patient started coughing up blood about 3 weeks ago. He has a history of Wegener's disease and it has been a while since he had a flare up of this.  He was sent to [place] per the physician that follows him for this.  He ended up having to have a port placed and bleed a lot on eliquis. He had a "hemodialysis" of the lungs.  He was restarted on a cancer drug to help treat his diagnosis and given large doses of prednisone.  He was also started on amlodipine 10 mg daily at discharge.   He is now calling with complaints of large amounts of swelling in his lower extremities.  He is not weighing at home.  He is currently on: Dapsone 100 mg once daily Cellcept 250 mg- 2 tablets (500 mg) BID Prednisone 20 mg- 2 tablets (40 mg) daily  One of his doctors cut his amlodipine in half on Saturday due to the swelling he has.  I have asked him to transmit to look at his Corvue level. I have asked Margarita Grizzle, [facility] RN to review this tomorrow. I have also scheduled him for follow up with Dr. Rockey Situ on 7/23 at 8:40 am to review his hospitalization/ symptoms/ blood thinner.  Mrs. Millspaugh is agreeable with the above plan and voices understanding.

## 2019-07-08 NOTE — Telephone Encounter (Signed)

## 2019-07-09 NOTE — Progress Notes (Signed)
EPIC Encounter for ICM Monitoring  Patient Name: Jesus Hendrix is a 73 y.o. male Date: 07/09/2019 Primary Care Physican: Patient, No Pcp Per Primary Cardiologist:Gollan Electrophysiologist:Allred Weight:unknown       Transmission reviewed.  Spoke with wife.  Advised received the message from the nurse she spoke with yesterday regarding current health issues. Explained remote transmission and the report does not suggest he currently has any fluid.     She said patient is at work today and he never misses a day no matter how bad he feels.   CorVueThoracic impedancenormal but suggestive of possible fluid accumulation from 6/22 - 7/3 and 7/14 - 7/17.  Prescribed:Furosemide20 mg take 1 tablet by mouth twice a day as needed for fluid.  Recommendations: Provided ICM direct number and encouraged to call if needed.  Patient has office appointment with Dr Rockey Situ 7/23 due to recent hospitalization and leg edema.   Follow-up plan: ICM clinic phone appointment on8/09/2019 to recheck fluid levels.   Copy of ICM check sent to Dr.Allred and Dr Rockey Situ.   3 month ICM trend: 07/08/2019    1 Year ICM trend:       [name], RN 07/09/2019 11:55 AM

## 2019-07-10 ENCOUNTER — Other Ambulatory Visit: Payer: Self-pay

## 2019-07-10 ENCOUNTER — Ambulatory Visit (INDEPENDENT_AMBULATORY_CARE_PROVIDER_SITE_OTHER): Payer: Medicare Other | Admitting: Cardiovascular Disease

## 2019-07-10 ENCOUNTER — Encounter: Payer: Self-pay | Admitting: Cardiovascular Disease

## 2019-07-10 VITALS — BP 118/62 | HR 71 | Ht 69.0 in | Wt 232.5 lb

## 2019-07-10 DIAGNOSIS — Z950 Presence of cardiac pacemaker: Secondary | ICD-10-CM | POA: Diagnosis not present

## 2019-07-10 DIAGNOSIS — I5032 Chronic diastolic (congestive) heart failure: Secondary | ICD-10-CM

## 2019-07-10 DIAGNOSIS — I442 Atrioventricular block, complete: Secondary | ICD-10-CM

## 2019-07-10 DIAGNOSIS — R600 Localized edema: Secondary | ICD-10-CM | POA: Diagnosis not present

## 2019-07-10 DIAGNOSIS — I6523 Occlusion and stenosis of bilateral carotid arteries: Secondary | ICD-10-CM

## 2019-07-10 DIAGNOSIS — I4819 Other persistent atrial fibrillation: Secondary | ICD-10-CM

## 2019-07-10 DIAGNOSIS — I25118 Atherosclerotic heart disease of native coronary artery with other forms of angina pectoris: Secondary | ICD-10-CM | POA: Diagnosis not present

## 2019-07-10 DIAGNOSIS — E782 Mixed hyperlipidemia: Secondary | ICD-10-CM

## 2019-07-10 DIAGNOSIS — R0602 Shortness of breath: Secondary | ICD-10-CM | POA: Diagnosis not present

## 2019-07-10 DIAGNOSIS — M7989 Other specified soft tissue disorders: Secondary | ICD-10-CM

## 2019-07-10 MED ORDER — LOSARTAN POTASSIUM 25 MG PO TABS: 25.0000 mg | ORAL_TABLET | Freq: Every day | ORAL | 3 refills | Status: DC

## 2019-07-10 MED ORDER — FUROSEMIDE 20 MG PO TABS: 20.0000 mg | ORAL_TABLET | Freq: Every day | ORAL | 3 refills | Status: DC

## 2019-07-10 NOTE — Patient Instructions (Addendum)
Medication Instructions:  Your physician has recommended you make the following change in your medication:  1. STOP Amlodipine 2. START Losartan 25 mg once daily 3. START Furosemide 20 mg once daily   If you need a refill on your cardiac medications before your next appointment, please call your pharmacy.    Lab work: CMP done today.  We would like you to come back in one month to have a CBC in one month done here in our office.   If you have labs (blood work) drawn today and your tests are completely normal, you will receive your results only by: Marland Kitchen MyChart Message (if you have MyChart) OR . A paper copy in the mail If you have any lab test that is abnormal or we need to change your treatment, we will call you to review the results.   Testing/Procedures: Your physician has requested that you have an echocardiogram. Echocardiography is a painless test that uses sound waves to create images of your heart. It provides your doctor with information about the size and shape of your heart and how well your heart's chambers and valves are working. This procedure takes approximately one hour. There are no restrictions for this procedure.     Follow-Up: At [facility], you and your health needs are our priority.  As part of our continuing mission to provide you with exceptional heart care, we have created designated Provider Care Teams.  These Care Teams include your primary Cardiologist (physician) and Advanced Practice Providers (APPs -  Physician Assistants and Nurse Practitioners) who all work together to provide you with the care you need, when you need it.  . You will need a follow up appointment in  2 months  . Providers on your designated Care Team:   . Murray Hodgkins, NP . Christell Faith, PA-C . Marrianne Mood, PA-C  Any Other Special Instructions Will Be Listed Below (If Applicable).  For educational health videos Log in to : [email or web address] Or : [email or web address], password :  [email or web address]   Echocardiogram An echocardiogram is a procedure that uses painless sound waves (ultrasound) to produce an image of the heart. Images from an echocardiogram can provide important information about:  Signs of coronary artery disease (CAD).  Aneurysm detection. An aneurysm is a weak or damaged part of an artery wall that bulges out from the normal force of blood pumping through the body.  Heart size and shape. Changes in the size or shape of the heart can be associated with certain conditions, including heart failure, aneurysm, and CAD.  Heart muscle function.  Heart valve function.  Signs of a past heart attack.  Fluid buildup around the heart.  Thickening of the heart muscle.  A tumor or infectious growth around the heart valves. Tell a health care provider about:  Any allergies you have.  All medicines you are taking, including vitamins, herbs, eye drops, creams, and over-the-counter medicines.  Any blood disorders you have.  Any surgeries you have had.  Any medical conditions you have.  Whether you are pregnant or may be pregnant. What are the risks? Generally, this is a safe procedure. However, problems may occur, including:  Allergic reaction to dye (contrast) that may be used during the procedure. What happens before the procedure? No specific preparation is needed. You may eat and drink normally. What happens during the procedure?   An IV tube may be inserted into one of your veins.  You may receive contrast through this tube. A contrast is  an injection that improves the quality of the pictures from your heart.  A gel will be applied to your chest.  A wand-like tool (transducer) will be moved over your chest. The gel will help to transmit the sound waves from the transducer.  The sound waves will harmlessly bounce off of your heart to allow the heart images to be captured in real-time motion. The images will be recorded on a computer. The procedure  may vary among health care providers and hospitals. What happens after the procedure?  You may return to your normal, everyday life, including diet, activities, and medicines, unless your health care provider tells you not to do that. Summary  An echocardiogram is a procedure that uses painless sound waves (ultrasound) to produce an image of the heart.  Images from an echocardiogram can provide important information about the size and shape of your heart, heart muscle function, heart valve function, and fluid buildup around your heart.  You do not need to do anything to prepare before this procedure. You may eat and drink normally.  After the echocardiogram is completed, you may return to your normal, everyday life, unless your health care provider tells you not to do that. This information is not intended to replace advice given to you by your health care provider. Make sure you discuss any questions you have with your health care provider. Document Released: 12/01/2000 Document Revised: 03/27/2019 Document Reviewed: 01/06/2017 Elsevier Patient Education  2020 [employer].

## 2019-07-10 NOTE — Progress Notes (Addendum)
[name], male   DOB: September 04, 1946, 73 y.o.   MRN: [id number] Cardiology Office Note  Date:  07/10/2019   ID:  Jesus Hendrix, DOB [birth year], MRN [id number]  PCP:  Patient, No Pcp Per   Chief Complaint  Patient presents with  . Other    Patient c/o swelling and SOB. meds reviewed verbally with patient.     HPI:  Jesus Hendrix is a 73 year old gentleman with  Persistent atrial fibrillation chronic back pain and sciatica down his left leg,  coronary artery disease and bypass surgery in 2007,  hyperlipidemia,  obesity,  kidney cancer,  status post laser ablation at [facility], ANCA associated vasculitis, Wegener's granulomatosis,  previous history of dizziness which he attributes to his Wegener's followed by [name] nephrology -Medication noncompliance who presents for routine followup of his coronary artery disease, atrial fibrillation, heart block with pacer, Wegener's, hemoptysis, blood loss  Wegeners, admitted at [facility] June 14, 2019 Presented with moderate volume hemoptysis Dropping HBG 8.5  Eliquis held CT scan concerning for diffuse alveolar hemorrhage Treated with plasmapheresis, rituximab, steroids, Hospital records reviewed with him in detail  CR >2 this year  On today's visit reports that he is short of breath on exertion Currently not taking most of his pills, concerned it was causing leg swelling We have confirmed with him, not taking Lasix, prednisone, dapsone, may be taking his CellCept as he knew this was important Stopped amlodipine 2 days ago on his own Put himself back on losartan 25 mg daily  REDS VEST reading today in the office 36% Reading was performed for shortness of breath, leg swelling, history of heart failure  EKG personally reviewed by myself on todays visit Shows paced rhythm rate 71 bpm underlying atrial fibrillation  Other past medical history reviewed heart rate in the 30s, transferred to [facility], Pacerplacedforcomplete heart  block switch to eliquis  Chronic severe back pain, walking with a limp Till working, digging, foundations and sewage Followed by Dr. 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 Known atrophic left kidney.  Previous issues with anxiety  cardiac catheterization in January 2007 details 75-80% long lesion in the LAD at the ostium, 95% lesion in the PDA. He was referred to bypass surgery. 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 Defect worse in the inferoapical region. No ischemia. Ejection fraction 55%. Exercised for 7 METS  Previous history of myalgias on high-dose cholesterol medication. Tolerating Lipitor 30 mg daily  PMH:   has a past medical history of Anxiety, Aortic stenosis, Atrial fibrillation ([place]) (06/06/2016), Choledocholithiasis, Chronic kidney disease, Chronic systolic CHF (congestive heart failure) ([place]), COPD (chronic obstructive pulmonary disease) ([place]), Coronary artery disease, Exogenous obesity, GERD (gastroesophageal reflux disease), Gout, Hiatal hernia, Hip pain, chronic (08/03/2016), History of colonic diverticulitis, History of renal cell carcinoma, Hyperlipidemia, Hypertension, Near syncope, Nephrolithiasis, Panic attacks, Spinal stenosis, Symptomatic bradycardia, Temporomandibular joint pain dysfunction syndrome, Umbilical hernia, Vitamin D deficiency, and Wegener's granulomatosis with renal involvement ([place]).  PSH:    Past Surgical History:  Procedure Laterality Date  . CARDIAC CATHETERIZATION     . CHOLECYSTECTOMY    .  COLONOSCOPY N/A 06/03/2015   Procedure: COLONOSCOPY;  Surgeon: Lucilla Lame, MD;  Location: [facility];  Service: Gastroenterology;  Laterality: N/A;  . CORONARY ARTERY BYPASS GRAFT  2004   CABG x 3  . EYE SURGERY    . HERNIA REPAIR     x 2  . HIP SURGERY     right hip replacement  . INTRAOCULAR LENS INSERTION    . KIDNEY SURGERY     cancer  . NOSE SURGERY    . PACEMAKER IMPLANT N/A 04/05/2018   SJM Biventricular pacemaker implanted by Dr Rayann Heman for second degree AV block, EF < 50% and permanent afib  . POLYPECTOMY  06/03/2015   Procedure: POLYPECTOMY;  Surgeon: Lucilla Lame, MD;  Location: [place];  Service: Gastroenterology;;  . PROSTATE SURGERY    . TOTAL HIP ARTHROPLASTY Left 12/19/2016   Procedure: LEFT TOTAL HIP ARTHROPLASTY;  Surgeon: Garald Balding, MD;  Location: [place];  Service: Orthopedics;  Laterality: Left;    Current Outpatient Medications  Medication Sig Dispense Refill  . amLODipine (NORVASC) 10 MG tablet Take 10 mg by mouth daily.     . Cholecalciferol (VITAMIN D) 2000 units CAPS Take 2,000 Units by mouth at bedtime.     . dapsone 100 MG tablet Take 1 tablet (100 mg) by mouth once daily    . furosemide (LASIX) 20 MG tablet Take by mouth.    . losartan (COZAAR) 50 MG tablet Take 50 mg by mouth daily.    . mycophenolate (CELLCEPT) 250 MG capsule Take 2 capsules (500 mg) by mouth twice daily    . predniSONE (DELTASONE) 20 MG tablet Take 2 tablets (40 mg) by mouth once daily     No current facility-administered medications for this visit.      Allergies:   Clonazepam   Social History:  The patient  reports that he quit smoking about 44 years ago. His smoking use included cigarettes. He has a 12.50 pack-year smoking history. He has never used smokeless tobacco. He reports that he does not drink alcohol or use drugs.   Family History:   family history includes Heart disease in his brother, mother, and sister.    Review  of Systems: Review of Systems  Constitutional: Negative.   Respiratory: Positive for shortness of breath.   Cardiovascular: Negative.   Gastrointestinal: Negative.   Musculoskeletal: Positive for back pain and joint pain.  Neurological: Negative.   All other systems reviewed and are negative.    PHYSICAL EXAM: VS:  BP 118/62 (BP Location: Left Arm, Patient Position: Sitting, Cuff Size: Normal)   Pulse 71   Ht 5\' 9"  (1.753 m)   Wt 232 lb 8 oz (105.5 kg)   BMI 34.33 kg/m  , BMI Body mass index is 34.33 kg/m. No significant change in exam Constitutional:  oriented to person, place, and time. No distress.  HENT:  Head: Normocephalic and atraumatic.  Eyes:  no discharge. No scleral icterus.  Neck: Normal range of motion. Neck supple. No JVD present.  Cardiovascular: irregularly irregular, normal heart sounds and intact distal pulses. Exam reveals no gallop and no friction rub.  1-2+ pitting lower extremity edema to below the knees No murmur heard. Pulmonary/Chest: Effort normal and breath sounds normal. No stridor. No respiratory distress.  no wheezes.  no rales.  no tenderness.  Abdominal: Soft.  no distension.  no tenderness.  Musculoskeletal: Normal range of motion.  no  tenderness or deformity.  Neurological:  normal muscle tone.  Coordination normal. No atrophy Skin: Skin is warm and dry. No rash noted. not diaphoretic.  Psychiatric:  normal mood and affect. behavior is normal. Thought content normal.    Recent Labs: No results found for requested labs within last 8760 hours.    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  Discussed with him, stressed importance of taking his medications and concern for recurrence of his disease if he does not take them appropriately.  Recommend repeat CBC in several weeks time  Hyperkalemia Was trending up at the time of discharge from hospital, we will check CMP today  Coronary artery disease involving native coronary artery of native heart with stable angina pectoris -  Currently with no symptoms of angina. No further workup at this time. Continue current medication regimen.  Shortness of breath Likely secondary to fluid retention, anemia REDS VEST reading today in the office 36% Reading was performed for shortness of breath, leg swelling, history of heart failure He has not been taking his Lasix for unclear reasons Recommended he start taking Lasix 20 daily  Persistent atrial fibrillation (HCC) - Eliquis on hold for now in the setting of recent hemoptysis, blood loss, persistent anemia We will wait for hemoglobin of 10 at least before restarting Eliquis Discussed with him, he is at risk of further hemoptysis if he does not take his medications  Secondary hypertension, unspecified We will hold the amlodipine given worsening leg swelling, he is back on losartan 25 mg daily and blood pressure stable  Hyperlipidemia It appears his cholesterol medications were held at discharge Previous noncompliance with several of his medications We will discuss with him in follow-up about  restarting  S/P CABG x 3 Previous Stress test as above, no ischemia No further workup  Chronic low back pain Seen by Dr. Carloyn Manner Reports back pain is stable    Total encounter time more than 45 minutes  Greater than 50% was spent in counseling and coordination of care with the patient      Orders Placed This Encounter  Procedures  . EKG 12-Lead     Signed, Esmond Plants, M.D., Ph.D. 07/10/2019  [place], [place]

## 2019-07-11 LAB — COMPREHENSIVE METABOLIC PANEL
ALT: 33 IU/L (ref 0–44)
AST: 20 IU/L (ref 0–40)
Albumin/Globulin Ratio: 2.2 (ref 1.2–2.2)
Albumin: 3.8 g/dL (ref 3.7–4.7)
Alkaline Phosphatase: 46 IU/L (ref 39–117)
BUN/Creatinine Ratio: 18 (ref 10–24)
BUN: 41 mg/dL — ABNORMAL HIGH (ref 8–27)
Bilirubin Total: 0.8 mg/dL (ref 0.0–1.2)
CO2: 22 mmol/L (ref 20–29)
Calcium: 8.2 mg/dL — ABNORMAL LOW (ref 8.6–10.2)
Chloride: 105 mmol/L (ref 96–106)
Creatinine, Ser: 2.31 mg/dL — ABNORMAL HIGH (ref 0.76–1.27)
GFR calc Af Amer: 31 mL/min/{1.73_m2} — ABNORMAL LOW (ref >59)
GFR calc non Af Amer: 27 mL/min/{1.73_m2} — ABNORMAL LOW (ref >59)
Globulin, Total: 1.7 g/dL (ref 1.5–4.5)
Glucose: 129 mg/dL — ABNORMAL HIGH (ref 65–99)
Potassium: 4.4 mmol/L (ref 3.5–5.2)
Sodium: 140 mmol/L (ref 134–144)
Total Protein: 5.5 g/dL — ABNORMAL LOW (ref 6.0–8.5)

## 2019-07-17 ENCOUNTER — Ambulatory Visit: Admit: 2019-07-17 | Discharge: 2019-07-17 | Payer: MEDICARE

## 2019-07-17 DIAGNOSIS — D899 Disorder involving the immune mechanism, unspecified: Secondary | ICD-10-CM

## 2019-07-17 DIAGNOSIS — I776 Arteritis, unspecified: Principal | ICD-10-CM

## 2019-07-17 DIAGNOSIS — N184 Chronic kidney disease, stage 4 (severe): Secondary | ICD-10-CM

## 2019-07-17 DIAGNOSIS — R042 Hemoptysis: Secondary | ICD-10-CM

## 2019-07-17 LAB — CBC W/ AUTO DIFF
BASOPHILS ABSOLUTE COUNT: 0.1 10*9/L (ref 0.0–0.1)
BASOPHILS RELATIVE PERCENT: 0.9 %
EOSINOPHILS RELATIVE PERCENT: 0.4 %
LARGE UNSTAINED CELLS: 1 % (ref 0–4)
LYMPHOCYTES ABSOLUTE COUNT: 0.6 10*9/L — ABNORMAL LOW (ref 1.5–5.0)
LYMPHOCYTES RELATIVE PERCENT: 5.9 %
MEAN CORPUSCULAR HEMOGLOBIN CONC: 31.3 g/dL (ref 31.0–37.0)
MEAN CORPUSCULAR HEMOGLOBIN: 33.7 pg (ref 26.0–34.0)
MEAN CORPUSCULAR VOLUME: 107.5 fL — ABNORMAL HIGH (ref 80.0–100.0)
MEAN PLATELET VOLUME: 9.4 fL (ref 7.0–10.0)
MONOCYTES ABSOLUTE COUNT: 0.3 10*9/L (ref 0.2–0.8)
MONOCYTES RELATIVE PERCENT: 3.1 %
NEUTROPHILS ABSOLUTE COUNT: 9.3 10*9/L — ABNORMAL HIGH (ref 2.0–7.5)
NEUTROPHILS RELATIVE PERCENT: 88.7 %
PLATELET COUNT: 268 10*9/L (ref 150–440)
RED BLOOD CELL COUNT: 2.64 10*12/L — ABNORMAL LOW (ref 4.50–5.90)
RED CELL DISTRIBUTION WIDTH: 17.8 % — ABNORMAL HIGH (ref 12.0–15.0)
WBC ADJUSTED: 10.4 10*9/L (ref 4.5–11.0)

## 2019-07-17 LAB — RENAL FUNCTION PANEL
ALBUMIN: 3.9 g/dL (ref 3.5–5.0)
ANION GAP: 9 mmol/L (ref 7–15)
BLOOD UREA NITROGEN: 52 mg/dL — ABNORMAL HIGH (ref 7–21)
BUN / CREAT RATIO: 21
CALCIUM: 9.4 mg/dL (ref 8.5–10.2)
CHLORIDE: 103 mmol/L (ref 98–107)
CO2: 24 mmol/L (ref 22.0–30.0)
EGFR CKD-EPI AA MALE: 29 mL/min/{1.73_m2} — ABNORMAL LOW (ref >=60)
EGFR CKD-EPI NON-AA MALE: 25 mL/min/{1.73_m2} — ABNORMAL LOW (ref >=60)
GLUCOSE RANDOM: 129 mg/dL (ref 70–179)
PHOSPHORUS: 3.4 mg/dL (ref 2.9–4.7)
POTASSIUM: 5.4 mmol/L — ABNORMAL HIGH (ref 3.5–5.0)

## 2019-07-17 LAB — BUN / CREAT RATIO: Urea nitrogen/Creatinine:MRto:Pt:Ser/Plas:Qn:: 21

## 2019-07-17 LAB — CREATININE, URINE: Lab: 54.6

## 2019-07-17 LAB — MONOCYTES RELATIVE PERCENT: Lab: 3.1

## 2019-07-17 LAB — PROTEIN / CREATININE RATIO, URINE: PROTEIN/CREAT RATIO, URINE: 1.269

## 2019-07-17 LAB — SMEAR REVIEW

## 2019-07-17 MED ORDER — DAPSONE 100 MG TABLET
ORAL_TABLET | Freq: Every day | ORAL | 2 refills | 30 days | Status: CP
Start: 2019-07-17 — End: 2019-10-15

## 2019-07-17 MED ORDER — FUROSEMIDE 20 MG TABLET
ORAL_TABLET | Freq: Two times a day (BID) | ORAL | 11 refills | 30 days | Status: CP
Start: 2019-07-17 — End: ?

## 2019-07-17 MED ORDER — PREDNISONE 10 MG TABLET
ORAL_TABLET | Freq: Every day | ORAL | 5 refills | 30 days | Status: CP
Start: 2019-07-17 — End: ?

## 2019-07-17 NOTE — Unmapped (Signed)
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 is a 73  year old man with ANCA associated vasculitis who presents for follow up visit. The patient was admitted with pulmonary hemorrhage and treated with pulse solumedrol and rituximab x1. He was discharged on prednisone 40 mg and cellcept 500 BID. He did not receive cellcept at discharge and had to have a prior authorization but did get the medication. He was scheduled for second dose of rituximab but apparently didn't know about the appointment so missed the infusion.     He denies any further hemoptysis. He still has significant DOE although states this has been better the last few days. He denies any chest pain or SOB at rest. He denies orthopnea or PND. He hasn't had any fevers or chills, oral ulcers. He states his appetite is okay and he denies any nausea, vomiting or diarrhea.He states his joints feel good and he denies any myalgias or arthralgias. He went to see his cardiologist recently and per their noite he was not taking prednisone, dapsone or lasix although patient reports taking prednisone and lasix but not dapsone, ling. I had discussed decreasing norvasc due to lower extremity edema but patient completely stopped this and restarted losartan. He notes continued significant lower extremity edema. He denies any rashes and states his urine is clear without blood and denies cola colored urine. He has been doing CIC 3x per day. He denies urination on his own and states he has not been good about drinking enough fluids.          Review of Systems   Constitutional: Negative for chills, fever and weight loss.   HENT: Negative for nosebleeds.    Eyes: Negative for pain.   Respiratory: Negative for cough and hemoptysis.    Cardiovascular: Positive for leg swelling. Negative for chest pain.   Gastrointestinal: Negative for abdominal pain, nausea and vomiting.   Genitourinary: Negative for dysuria, flank pain and hematuria.   Musculoskeletal: Negative for joint pain and myalgias.   Skin: Negative for rash.      All systems reviewed and are negative except as listed above.      PAST MEDICAL HISTORY:  Past Medical History:   Diagnosis Date   ??? ANCA-associated vasculitis (CMS-HCC)    ??? Arthritis     osteoarthritis w/ radiculopathy left leg   ??? BPH (benign prostatic hypertrophy)    ??? CAD (coronary artery disease)     CBAG X 3 2005; + stress test, denies MI   ??? Cancer (CMS-HCC)     right papillary urothelial carcinoma, low grade    ??? GERD (gastroesophageal reflux disease)    ??? HTN (hypertension)    ??? Wegener's disease, pulmonary (CMS-HCC)        ALLERGIES  Clonazepam    MEDICATIONS:  Current Outpatient Medications   Medication Sig Dispense Refill   ??? acetaminophen (TYLENOL) 325 MG tablet Take 650 mg by mouth every six (6) hours as needed for pain.      ??? amLODIPine (NORVASC) 10 MG tablet Take 1 tablet (10 mg total) by mouth nightly. 90 tablet 3   ??? atorvastatin (LIPITOR) 20 MG tablet Take 10 mg by mouth daily. (patient takes once every 2-3 days)     ???  cholecalciferol, vitamin D3, (CHOLECALCIFEROL) 1,000 unit tablet Take 1,000 Units by mouth daily.      ??? clonazePAM (KLONOPIN) 1 MG tablet Take 1 mg by mouth nightly as needed for anxiety or sleep.      ??? dapsone 100 MG tablet Take 1 tablet (100 mg total) by mouth daily. 30 tablet 2   ??? fluticasone (FLONASE) 50 mcg/actuation nasal spray 1 spray by Each Nare route daily.      ??? furosemide (LASIX) 20 MG tablet Take 1 tablet (20 mg total) by mouth Two (2) times a day. 60 tablet 11   ??? mycophenolate (CELLCEPT) 250 mg capsule Take 2 capsules (500 mg total) by mouth two (2) times a day. 120 capsule 11   ??? niacin 500 MG tablet Take 500 mg by mouth daily with breakfast.     ??? pantoprazole (PROTONIX) 40 MG tablet Take 1 tablet (40 mg total) by mouth daily. 90 tablet 3   ??? predniSONE (DELTASONE) 10 MG tablet Take 2 tablets (20 mg total) by mouth daily. 60 tablet 5   ??? tamsulosin (FLOMAX) 0.4 mg capsule Take 1 capsule (0.4 mg total) by mouth daily. 90 capsule 3     No current facility-administered medications for this visit.        PHYSICAL EXAM:    Vitals:    07/17/19 0843   BP: 128/66   Pulse: 72   Resp: 20   Temp: 36.8 ??C (98.2 ??F)   SpO2: 96%       CONSTITUTIONAL: Alert,in NAD.   HEAD: [place]/AT  EARS: TM's without gross abnormalities    EYES: Extra ocular movements intact. sclerae anicteric.   NECK: Supple, no lymphadenopathy  CARDIOVASCULAR: Irregular, 3/6 SEM unchanged, no rubs.   PULM: Clear to auscultation bilaterally. No wheezing or rhonchi appreciated  GASTROINTESTINAL: Soft, active bowel sounds, nontender, large ventral hernia. No CVAT .      EXTREMITIES: 3+ lower extremity edema to knee  NEUROLOGIC: Non focal motor abnormalities  MS: no synovitis appreciated  Skin: scattered ecchymosis     MEDICAL DECISION MAKING    Urine microscopic -  occasional RBC/HPF, occasional  acanthocytes, + hyaline casts     Recent Results (from the past 340 hour(s))   POCT Urinalysis Dipstick    Collection Time: 07/17/19  8:46 AM   Result Value Ref Range    Spec Gravity/POC 1.015 1.003 - 1.030    PH/POC 5.5 5.0 - 9.0    Leuk Esterase/POC Negative Negative    Nitrite/POC Negative Negative    Protein/POC 2+ (A) Negative    UA Glucose/POC Negative Negative    Ketones, POC Negative Negative    Bilirubin/POC Negative Negative    Blood/POC 2+ (A) Negative    Urobilinogen/POC 0.2 0.2 - 1.0 mg/dL   Protein/Creatinine Ratio, Urine    Collection Time: 07/17/19  9:16 AM   Result Value Ref Range    Creat U 54.6 Undefined mg/dL    Protein, Ur 44.0 Undefined mg/dL    Protein/Creatinine Ratio, Urine 1.269 Undefined   Renal Function Panel Collection Time: 07/17/19  9:54 AM   Result Value Ref Range    Sodium 136 135 - 145 mmol/L    Potassium 5.4 (H) 3.5 - 5.0 mmol/L    Chloride 103 98 - 107 mmol/L    CO2 24.0 22.0 - 30.0 mmol/L    Anion Gap 9 7 - 15 mmol/L    BUN 52 (H) 7 - 21 mg/dL    Creatinine 3.47 (H) 0.70 -  1.30 mg/dL    BUN/Creatinine Ratio 21     EGFR CKD-EPI Non-African American, Male 25 (L) >=60 mL/min/1.79m2    EGFR CKD-EPI African American, Male 36 (L) >=60 mL/min/1.12m2    Glucose 129 70 - 179 mg/dL    Calcium 9.4 8.5 - 60.4 mg/dL    Phosphorus 3.4 2.9 - 4.7 mg/dL    Albumin 3.9 3.5 - 5.0 g/dL   CBC w/ Differential    Collection Time: 07/17/19  9:54 AM   Result Value Ref Range    WBC 10.4 4.5 - 11.0 10*9/L    RBC 2.64 (L) 4.50 - 5.90 10*12/L    HGB 8.9 (L) 13.5 - 17.5 g/dL    HCT 54.0 (L) 98.1 - 53.0 %    MCV 107.5 (H) 80.0 - 100.0 fL    MCH 33.7 26.0 - 34.0 pg    MCHC 31.3 31.0 - 37.0 g/dL    RDW 19.1 (H) 47.8 - 15.0 %    MPV 9.4 7.0 - 10.0 fL    Platelet 268 150 - 440 10*9/L    Neutrophils % 88.7 %    Lymphocytes % 5.9 %    Monocytes % 3.1 %    Eosinophils % 0.4 %    Basophils % 0.9 %    Absolute Neutrophils 9.3 (H) 2.0 - 7.5 10*9/L    Absolute Lymphocytes 0.6 (L) 1.5 - 5.0 10*9/L    Absolute Monocytes 0.3 0.2 - 0.8 10*9/L    Absolute Eosinophils 0.0 0.0 - 0.4 10*9/L    Absolute Basophils 0.1 0.0 - 0.1 10*9/L    Large Unstained Cells 1 0 - 4 %    Macrocytosis Marked (A) Not Present    Anisocytosis Slight (A) Not Present    Hypochromasia Marked (A) Not Present   Morphology Review    Collection Time: 07/17/19  9:54 AM   Result Value Ref Range    Smear Review Comments See Comment (A) Undefined       ASSESSMENT/PLAN:      Mr.Nathaniel Ruiz is a 73 y.o. year old patient with a past medical history significant for ANCA associated vasculitis and  low grade papillary tumor who is being seen for follow up visit.   Marland Kitchen   1. ANCA associated vasculitis -  Recent AKI  And pulmonary hemorrhage associated with ANCA associated vasculitis. Pulmonary symptoms improved. Non hemoptysis. Urine sediment not active. We discussed decreasing prednisone to 20 mg daily and down to 10 mg in 1 week. I explained the need for second rituximab infusion and that this was changed to tomorrow after he missed last appointment. I spoke to his wife and reviewed medication need and his appointment for tomorrow. He indicated he had not been taking dapsone and also stated he wasn't taking PPI. I reviewed the rationale for both medications and discussed with his wife as well. Will plan phone visit in 2 weeks and if stable will further taper prednisone therapy.     2. Urological - recent cystoscopy negative.     3. CV -  BP at goal, + edema which is worsened. Discussed this could be associated with prednisone therapy. Decrease prednisone and will increase lasix to BID. Saw cardiology and echo scheduled. Continue off eliquis s/p pulmonary hemorrhage per cardiologist.    4. Heme:   CBC smear with myelocytes. Will contact heme ? Need for evaluation.

## 2019-07-17 NOTE — Unmapped (Addendum)
1. You have an appointment for your second rituximab infusion tomorrow at 8 am. This is very important to control your vasculitis. It is at the main hospital in the transplant surgery clinic on the 4th floor.   2. Please reduce your prednisone to 20 mg daily. After 1 week (August 6th) would like you to decrease the prednisone to 10 mg daily (1/2 a pill). I will then have a phone or video visit with you on August 12th and we can lower the dose further.   3. Please continue to take the cellcept 500 mg 2 times per day. You also need to take your protonix to protect against ulcers in your stomach while on the prednisone.     4. Please continue to take dapsone. This medication is to protect against certain types of lung infections you can get after being on high dose steroids.   5. I am checking labs today and if they look stable I will ask you to increase the lasix pill to 2 times per day. One in the morning and one in the late afternoon (4 pm). This may help with swelling.   6. I have tentatively scheduled you for another in person visit August 27th

## 2019-07-18 ENCOUNTER — Ambulatory Visit: Admit: 2019-07-18 | Discharge: 2019-07-19 | Payer: MEDICARE

## 2019-07-18 DIAGNOSIS — I776 Arteritis, unspecified: Principal | ICD-10-CM

## 2019-07-18 DIAGNOSIS — R042 Hemoptysis: Secondary | ICD-10-CM

## 2019-07-18 LAB — CBC W/ AUTO DIFF
BASOPHILS RELATIVE PERCENT: 0.9 %
EOSINOPHILS ABSOLUTE COUNT: 0.1 10*9/L (ref 0.0–0.4)
EOSINOPHILS RELATIVE PERCENT: 0.6 %
HEMATOCRIT: 26.7 % — ABNORMAL LOW (ref 41.0–53.0)
HEMOGLOBIN: 8.3 g/dL — ABNORMAL LOW (ref 13.5–17.5)
LARGE UNSTAINED CELLS: 2 % (ref 0–4)
LYMPHOCYTES ABSOLUTE COUNT: 0.8 10*9/L — ABNORMAL LOW (ref 1.5–5.0)
LYMPHOCYTES RELATIVE PERCENT: 9.3 %
MEAN CORPUSCULAR HEMOGLOBIN CONC: 31.2 g/dL (ref 31.0–37.0)
MEAN CORPUSCULAR HEMOGLOBIN: 33.6 pg (ref 26.0–34.0)
MEAN CORPUSCULAR VOLUME: 107.9 fL — ABNORMAL HIGH (ref 80.0–100.0)
MEAN PLATELET VOLUME: 8.1 fL (ref 7.0–10.0)
MONOCYTES ABSOLUTE COUNT: 0.6 10*9/L (ref 0.2–0.8)
MONOCYTES RELATIVE PERCENT: 7.5 %
NEUTROPHILS ABSOLUTE COUNT: 6.7 10*9/L (ref 2.0–7.5)
NEUTROPHILS RELATIVE PERCENT: 79.6 %
PLATELET COUNT: 234 10*9/L (ref 150–440)
RED BLOOD CELL COUNT: 2.47 10*12/L — ABNORMAL LOW (ref 4.50–5.90)
RED CELL DISTRIBUTION WIDTH: 17.5 % — ABNORMAL HIGH (ref 12.0–15.0)
WBC ADJUSTED: 8.4 10*9/L (ref 4.5–11.0)

## 2019-07-18 LAB — POLYCHROMASIA

## 2019-07-18 LAB — MONOCYTES ABSOLUTE COUNT: Lab: 0.6

## 2019-07-18 NOTE — Unmapped (Signed)
0818: Pt arrived Transplant Infusion room for Rituximab infusion. Condition: well ; Mobility: ambulating ; accompanied by self. 06/20/19   0824 VS stable.   1610 PIV placed, labs collected and sent  , urine collected and sent .  9604 Premedications given.  0914 Rituximab infusion initiated rituximab: rituximab subsequent infusion protocol to start 100 mg/hr, rate increase by 100 mg/hr every 30 minutes as tolerated till maximum rate of 400 mg/hr.    Today's lab results: WBC 8.4 (must be >2.0 or hold infusion and notify provider) and Platelets (must be > 50 or hold infusion and notify provider).   1226 Rituximab infusion completed, pt tolerated infusion well.  1240 VS stable , PIV removed, pt left clinic feeling well , accompanied by self.  See Flowsheet and MAR for all details of visit.

## 2019-07-21 LAB — PR3-QUANT: Lab: 45.2 — ABNORMAL HIGH

## 2019-07-21 LAB — ANTI-NEUTROPHILIC CYTOPLASMIC ANTIBODY
MPO-ELISA: NEGATIVE
PR3-QUANT: 45.2 U/mL — ABNORMAL HIGH (ref <21.0)

## 2019-07-30 ENCOUNTER — Ambulatory Visit (INDEPENDENT_AMBULATORY_CARE_PROVIDER_SITE_OTHER): Payer: Medicare Other

## 2019-07-30 ENCOUNTER — Institutional Professional Consult (permissible substitution): Admit: 2019-07-30 | Discharge: 2019-07-31 | Payer: MEDICARE

## 2019-07-30 DIAGNOSIS — I5032 Chronic diastolic (congestive) heart failure: Secondary | ICD-10-CM

## 2019-07-30 DIAGNOSIS — Z950 Presence of cardiac pacemaker: Secondary | ICD-10-CM

## 2019-07-30 DIAGNOSIS — R7989 Other specified abnormal findings of blood chemistry: Principal | ICD-10-CM

## 2019-07-30 DIAGNOSIS — R042 Hemoptysis: Secondary | ICD-10-CM

## 2019-07-30 DIAGNOSIS — I10 Essential (primary) hypertension: Secondary | ICD-10-CM

## 2019-07-30 DIAGNOSIS — I776 Arteritis, unspecified: Secondary | ICD-10-CM

## 2019-07-30 DIAGNOSIS — D899 Disorder involving the immune mechanism, unspecified: Secondary | ICD-10-CM

## 2019-07-30 DIAGNOSIS — N184 Chronic kidney disease, stage 4 (severe): Secondary | ICD-10-CM

## 2019-07-30 NOTE — Unmapped (Signed)
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 The patient was admitted with pulmonary hemorrhage and acute on CKD in early July. Notably he had been off immunosuppressant therapy for several years. He was treated with with pulse solumedrol, rituximab x1 and PLEX. He was discharged on prednisone 40 mg and cellcept 500 BID. The patient had developed edema and self discontinued amlodipine after discharge and restarted ARB therapy. He underwent second rituximab infusion 07/18/2019. I saw there patient the day previously and told him to decrease prednisone to 20 mg daily and gave him instructions to decrease to 10 mg daily on July 24, 2019.     The patient was contacted via phone. He states he has been taking his medicines as instructed but unclear of doses as his wife handles those. He denies fevers, chills or night sweats. He does complain of DOE but denies chest pain, orthopnea, further hemoptysis or frank PND. He states he feels great when he is at work but feels when he gets home particularly at night he is feeling anxious and his SOB is related to anxiety attacks. He states his edema is slightly improved but still present and he feels very fatigued.      He denies nausea, vomiting or diarrhea. He hasn't been bothered by any arthralgias, mylagias, rashes or oral ulcers. He denies visible hematuria, dysuria, or cola colored urine. He complains of difficulty sleeping due to anxiety.    I contacted the patient's wife per his request to discuss current medication dosing. She stated his edema had improved but still present and states he is happy when he is at work. She confirmed he has not had any further hemoptysis. Unfortunately despite giving the patient after visit instructions discussing steroid taper he did not give these to his wife and he is still been getting prednisone 20 mg and lasix once daily.       Review of Systems   Constitutional: Negative for chills and fever.   Respiratory: Positive for shortness of breath. Negative for hemoptysis.    Cardiovascular: Positive for leg swelling. Negative for chest pain, orthopnea and PND.   Gastrointestinal: Negative for abdominal pain, nausea and vomiting.   Genitourinary: Negative for dysuria, flank pain and hematuria.   Musculoskeletal: Negative for joint pain and myalgias.   Skin: Negative for rash.   Psychiatric/Behavioral: The patient has insomnia.       All systems reviewed and are negative except as listed above.      PAST MEDICAL HISTORY:  Past Medical History:   Diagnosis Date   ??? ANCA-associated vasculitis (CMS-HCC)    ??? Arthritis     osteoarthritis w/ radiculopathy left leg   ??? BPH (benign prostatic hypertrophy)    ??? CAD (coronary artery disease)     CBAG X 3 2005; + stress test, denies MI   ??? Cancer (CMS-HCC)     right papillary urothelial carcinoma, low grade    ??? GERD (gastroesophageal reflux disease)    ??? HTN (hypertension)    ??? Wegener's disease, pulmonary (CMS-HCC)        ALLERGIES  Clonazepam    MEDICATIONS:  Current Outpatient Medications   Medication Sig Dispense Refill   ??? losartan (COZAAR) 25 MG tablet Take 25 mg by mouth daily.     ??? predniSONE (DELTASONE) 10 MG tablet Take 2 tablets (20 mg total) by mouth daily. (Patient taking differently: Take 20 mg by mouth daily. ) 60 tablet 5   ??? acetaminophen (TYLENOL) 325 MG tablet Take 650 mg by mouth every six (6) hours as needed for pain.      ??? amLODIPine (NORVASC) 10 MG tablet Take 1 tablet (10 mg total) by mouth nightly. 90 tablet 3   ??? atorvastatin (LIPITOR) 20 MG tablet Take 10 mg by mouth daily. (patient takes once every 2-3 days)     ??? cholecalciferol, vitamin D3, (CHOLECALCIFEROL) 1,000 unit tablet Take 1,000 Units by mouth daily.      ??? clonazePAM (KLONOPIN) 1 MG tablet Take 1 mg by mouth nightly as needed for anxiety or sleep.      ??? dapsone 100 MG tablet Take 1 tablet (100 mg total) by mouth daily. 30 tablet 2   ??? fluticasone (FLONASE) 50 mcg/actuation nasal spray 1 spray by Each Nare route daily.      ??? furosemide (LASIX) 20 MG tablet Take 1 tablet (20 mg total) by mouth Two (2) times a day. 60 tablet 11   ??? mycophenolate (CELLCEPT) 250 mg capsule Take 2 capsules (500 mg total) by mouth two (2) times a day. 120 capsule 11   ??? niacin 500 MG tablet Take 500 mg by mouth daily with breakfast.     ??? pantoprazole (PROTONIX) 40 MG tablet Take 1 tablet (40 mg total) by mouth daily. 90 tablet 3   ??? tamsulosin (FLOMAX) 0.4 mg capsule Take 1 capsule (0.4 mg total) by mouth daily. 90 capsule 3     No current facility-administered medications for this visit.        PHYSICAL EXAM:    There were no vitals filed for this visit.    Examination deferred due to video visit     MEDICAL DECISION MAKING        Recent Results (from the past 340 hour(s))   POCT Urinalysis Dipstick    Collection Time: 07/17/19  8:46 AM   Result Value Ref Range    Spec Gravity/POC 1.015 1.003 - 1.030    PH/POC 5.5 5.0 - 9.0    Leuk Esterase/POC Negative Negative    Nitrite/POC Negative Negative    Protein/POC 2+ (A) Negative    UA Glucose/POC Negative Negative    Ketones, POC Negative Negative    Bilirubin/POC Negative Negative    Blood/POC 2+ (A) Negative    Urobilinogen/POC 0.2 0.2 - 1.0 mg/dL   Protein/Creatinine Ratio, Urine    Collection Time: 07/17/19  9:16 AM   Result Value Ref Range    Creat U 54.6 Undefined mg/dL    Protein, Ur 19.1 Undefined mg/dL    Protein/Creatinine Ratio, Urine 1.269 Undefined   Anti-neutrophilic Cytoplasmic Antibody (ANCA)    Collection Time: 07/17/19  9:54 AM   Result Value Ref Range    ANCA Screen      ANCA IFA Positive, Cytoplasmic Pattern (A) Negative    MPO-Elisa Negative Negative    MPO-Quant 1.9 <21.0 U/mL    PR3 Elisa Positive (A) Negative    PR3-Quant 45.2 (H) <21.0 U/mL   Renal Function Panel    Collection Time: 07/17/19  9:54 AM   Result Value Ref Range  Sodium 136 135 - 145 mmol/L    Potassium 5.4 (H) 3.5 - 5.0 mmol/L    Chloride 103 98 - 107 mmol/L    CO2 24.0 22.0 - 30.0 mmol/L    Anion Gap 9 7 - 15 mmol/L    BUN 52 (H) 7 - 21 mg/dL    Creatinine 1.61 (H) 0.70 - 1.30 mg/dL    BUN/Creatinine Ratio 21     EGFR CKD-EPI Non-African American, Male 25 (L) >=60 mL/min/1.14m2    EGFR CKD-EPI African American, Male 70 (L) >=60 mL/min/1.81m2    Glucose 129 70 - 179 mg/dL    Calcium 9.4 8.5 - 09.6 mg/dL    Phosphorus 3.4 2.9 - 4.7 mg/dL    Albumin 3.9 3.5 - 5.0 g/dL   CBC w/ Differential    Collection Time: 07/17/19  9:54 AM   Result Value Ref Range    WBC 10.4 4.5 - 11.0 10*9/L    RBC 2.64 (L) 4.50 - 5.90 10*12/L    HGB 8.9 (L) 13.5 - 17.5 g/dL    HCT 04.5 (L) 40.9 - 53.0 %    MCV 107.5 (H) 80.0 - 100.0 fL    MCH 33.7 26.0 - 34.0 pg    MCHC 31.3 31.0 - 37.0 g/dL    RDW 81.1 (H) 91.4 - 15.0 %    MPV 9.4 7.0 - 10.0 fL    Platelet 268 150 - 440 10*9/L    Neutrophils % 88.7 %    Lymphocytes % 5.9 %    Monocytes % 3.1 %    Eosinophils % 0.4 %    Basophils % 0.9 %    Absolute Neutrophils 9.3 (H) 2.0 - 7.5 10*9/L    Absolute Lymphocytes 0.6 (L) 1.5 - 5.0 10*9/L    Absolute Monocytes 0.3 0.2 - 0.8 10*9/L    Absolute Eosinophils 0.0 0.0 - 0.4 10*9/L    Absolute Basophils 0.1 0.0 - 0.1 10*9/L    Large Unstained Cells 1 0 - 4 %    Macrocytosis Marked (A) Not Present    Anisocytosis Slight (A) Not Present    Hypochromasia Marked (A) Not Present   Morphology Review    Collection Time: 07/17/19  9:54 AM   Result Value Ref Range    Smear Review Comments See Comment (A) Undefined   CBC w/ Differential    Collection Time: 07/18/19  8:19 AM   Result Value Ref Range    WBC 8.4 4.5 - 11.0 10*9/L    RBC 2.47 (L) 4.50 - 5.90 10*12/L    HGB 8.3 (L) 13.5 - 17.5 g/dL    HCT 78.2 (L) 95.6 - 53.0 %    MCV 107.9 (H) 80.0 - 100.0 fL    MCH 33.6 26.0 - 34.0 pg    MCHC 31.2 31.0 - 37.0 g/dL    RDW 21.3 (H) 08.6 - 15.0 %    MPV 8.1 7.0 - 10.0 fL    Platelet 234 150 - 440 10*9/L    Neutrophils % 79.6 %    Lymphocytes % 9.3 %    Monocytes % 7.5 %    Eosinophils % 0.6 %    Basophils % 0.9 %    Absolute Neutrophils 6.7 2.0 - 7.5 10*9/L    Absolute Lymphocytes 0.8 (L) 1.5 - 5.0 10*9/L    Absolute Monocytes 0.6 0.2 - 0.8 10*9/L    Absolute Eosinophils 0.1 0.0 - 0.4 10*9/L    Absolute Basophils 0.1 0.0 - 0.1 10*9/L  Large Unstained Cells 2 0 - 4 %    Macrocytosis Marked (A) Not Present    Anisocytosis Slight (A) Not Present    Hypochromasia Marked (A) Not Present   Morphology Review    Collection Time: 07/18/19  8:19 AM   Result Value Ref Range    Smear Review Comments See Comment (A) Undefined    Polychromasia Slight (A) Not Present       ASSESSMENT/PLAN:      Nathaniel Ruiz is a 73 y.o. year old patient with a past medical history significant for ANCA associated vasculitis and  low grade papillary tumor who is being seen for follow up visit.   Marland Kitchen   1. ANCA associated vasculitis -  Recent AKI  And pulmonary hemorrhage associated with ANCA associated vasculitis. He has not had further hemoptysis. He complains of DOE and fatigue but unclear etiology as later symptoms bother him at home and states he feels best at work. I offered to have him evaluated in the hospital for DOE but he declined. He doesn't describe specific complaints concerning for active vasculitis and since prednisone may be increasing fluid retention and aggravating anxiety I discussed with his wife decreasing the prednisone to 10 mg daily. Given his edema I have also recommended he increase his lasix to BID dosing and reviewed this with his wife. I explained if he feels worse he should present immediately to nearest hospital and restated that I would be happy to arrange admission. He stated he didn't think that was needed. I did confirm with his wife that he is taking cellcept and dapsone.    2. Urological - recent cystoscopy negative.     3. CV -  Edema per history is better. Most recent urine/protein non nephrotic. Discussed ? If cardiologist was doing a repeat echo and patient said he didn't know. Advised him to contact him for follow up. Will increase lasix as above and lower prednisone which should help with edema. Still off eliquis s/p pulmonary hemorrhage per cardiologist.    4. Heme:   CBC smear with myelocytes. Discussed evaluation hematology econsult placed. Notably previously patient underwent bone marrow biopsy in the setting of abnormal smear while receiving cyclophosphamide therapy for his vasculitis.     I spent 20 minutes on the phone with the patient. I spent an additional 20 minutes on pre- and post-visit activities.     The patient was physically located in [place] or a state in which I am permitted to provide care. The patient and/or parent/guardian understood that s/he may incur co-pays and cost sharing, and agreed to the telemedicine visit. The visit was reasonable and appropriate under the circumstances given the patient's presentation at the time.    The patient and/or parent/guardian has been advised of the potential risks and limitations of this mode of treatment (including, but not limited to, the absence of in-person examination) and has agreed to be treated using telemedicine. The patient's/patient's family's questions regarding telemedicine have been answered.     If the visit was completed in an ambulatory setting, the patient and/or parent/guardian has also been advised to contact their provider???s office for worsening conditions, and seek emergency medical treatment and/or call 911 if the patient deems either necessary.

## 2019-07-30 NOTE — Unmapped (Signed)
E-Consult:    An E-consult has been requested for this patient regarding the clinical significance of myelocytes being present on a CBC w/ diff. I have reviewed the most recent CBCs for this patient, which appear to be relatively normal other than macrocytic anemia.     Myelocytes are a normal component of WBC maturation (typically seen in the bone marrow) and can often be seen in the peripheral blood in both normal circumstances and pathologic processes. In the vast majority of cases, myelocytes being present is a normal finding. In diseases such as CML or CMML, myelocytes can be seen in higher frequency, though this is almost always associated with an elevated total WBC. Reviewing the recent CBCs for this patient, I have no concerns for CML or CMML at this time, thus this is a normal finding.    If the patient developed progressive cytopenias, a referral to Hematology for evaluation of MDS or other bone marrow disorders would be warranted.     Lab Results   Component Value Date    WBC 8.4 07/18/2019    HGB 8.3 (L) 07/18/2019    HCT 26.7 (L) 07/18/2019    MCV 107.9 (H) 07/18/2019    RDW 17.5 (H) 07/18/2019    PLT 234 07/18/2019    NEUTROPCT 79.6 07/18/2019    LYMPHOPCT 9.3 07/18/2019    MONOPCT 7.5 07/18/2019    EOSPCT 0.6 07/18/2019    BASOPCT 0.9 07/18/2019     Patsy Lager, MD  Clinical Instructor  [facility]

## 2019-07-31 ENCOUNTER — Encounter: Payer: Medicare Other | Admitting: *Deleted

## 2019-08-01 NOTE — Progress Notes (Signed)
EPIC Encounter for ICM Monitoring  Patient Name: Jesus Hendrix is a 73 y.o. male Date: 08/01/2019 Primary Care Physican: Patient, No Pcp Per Primary Cardiologist:Gollan Electrophysiologist:Allred Weight:unknown       Attempted call to patient/wife and unable to reach.  Left detailed message per DPR regarding transmission. Transmission reviewed.   CorVueThoracic impedancenormal.  Prescribed:Furosemide20 mg take 1 tablet by mouth daily.  Recommendations: Left voice mail with ICM number and encouraged to call if experiencing any fluid symptoms.   Follow-up plan: ICM clinic phone appointment on10/04/2019.  OV scheduled with Dr Rayann Heman 08/18/2019  Copy of ICM check sent to Dr.Allred.  3 month ICM trend: 07/30/2019    1 Year ICM trend:       [name], RN 08/01/2019 10:32 AM

## 2019-08-07 ENCOUNTER — Ambulatory Visit: Admit: 2019-08-07 | Discharge: 2019-08-07 | Payer: MEDICARE

## 2019-08-07 DIAGNOSIS — I776 Arteritis, unspecified: Principal | ICD-10-CM

## 2019-08-07 DIAGNOSIS — N184 Chronic kidney disease, stage 4 (severe): Secondary | ICD-10-CM

## 2019-08-07 DIAGNOSIS — I10 Essential (primary) hypertension: Secondary | ICD-10-CM

## 2019-08-07 DIAGNOSIS — C649 Malignant neoplasm of unspecified kidney, except renal pelvis: Secondary | ICD-10-CM

## 2019-08-07 LAB — CBC
HEMOGLOBIN: 9.4 g/dL — ABNORMAL LOW (ref 13.5–17.5)
MEAN CORPUSCULAR HEMOGLOBIN CONC: 31.1 g/dL (ref 31.0–37.0)
MEAN CORPUSCULAR HEMOGLOBIN: 34.1 pg — ABNORMAL HIGH (ref 26.0–34.0)
MEAN CORPUSCULAR VOLUME: 109.7 fL — ABNORMAL HIGH (ref 80.0–100.0)
MEAN PLATELET VOLUME: 8.1 fL (ref 7.0–10.0)
PLATELET COUNT: 296 10*9/L (ref 150–440)
RED BLOOD CELL COUNT: 2.76 10*12/L — ABNORMAL LOW (ref 4.50–5.90)
RED CELL DISTRIBUTION WIDTH: 17.9 % — ABNORMAL HIGH (ref 12.0–15.0)
WBC ADJUSTED: 8.7 10*9/L (ref 4.5–11.0)

## 2019-08-07 LAB — RENAL FUNCTION PANEL
ALBUMIN: 3.9 g/dL (ref 3.5–5.0)
BLOOD UREA NITROGEN: 40 mg/dL — ABNORMAL HIGH (ref 7–21)
BUN / CREAT RATIO: 15
CALCIUM: 9.1 mg/dL (ref 8.5–10.2)
CHLORIDE: 102 mmol/L (ref 98–107)
CO2: 26 mmol/L (ref 22.0–30.0)
CREATININE: 2.61 mg/dL — ABNORMAL HIGH (ref 0.70–1.30)
EGFR CKD-EPI AA MALE: 27 mL/min/{1.73_m2} — ABNORMAL LOW (ref >=60)
EGFR CKD-EPI NON-AA MALE: 23 mL/min/{1.73_m2} — ABNORMAL LOW (ref >=60)
GLUCOSE RANDOM: 121 mg/dL (ref 70–179)
PHOSPHORUS: 3.9 mg/dL (ref 2.9–4.7)
POTASSIUM: 4.8 mmol/L (ref 3.5–5.0)
SODIUM: 137 mmol/L (ref 135–145)

## 2019-08-07 LAB — PROTEIN URINE: Protein:MCnc:Pt:Urine:Qn:: 10.5

## 2019-08-07 LAB — GLUCOSE RANDOM: Glucose:MCnc:Pt:Ser/Plas:Qn:: 121

## 2019-08-07 LAB — RED CELL DISTRIBUTION WIDTH: Lab: 17.9 — ABNORMAL HIGH

## 2019-08-07 LAB — PROTEIN / CREATININE RATIO, URINE
CREATININE, URINE: 30.6 mg/dL
PROTEIN/CREAT RATIO, URINE: 0.343

## 2019-08-07 NOTE — Unmapped (Addendum)
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 is a 73 year old man with ANCA associated vasculitis who is seen for follow up of vasculitis. The patient was admitted with pulmonary hemorrhage and acute on CKD in early July. Notably he had been off immunosuppressant therapy for several years. He was treated with with pulse solumedrol, rituximab x1 and PLEX. He was discharged on prednisone 40 mg and cellcept 500 BID. The patient had developed edema and self discontinued amlodipine after discharge and restarted ARB therapy. He underwent second rituximab infusion 07/18/2019. I saw there patient the day previously and told him to decrease prednisone to 20 mg daily and gave him instructions to decrease to 10 mg daily on July 24, 2019. He did not decrease his steroids so after his visit last week I contacted his wife and asked her to decrease to 10 mg daily.     Overall he is feeling somewhat better. He states he is having more difficulty getting up from a seated position. He states his legs feel weak. He states his breathing is better although feels quite fatigued when he ambulates. He has a cough productive of clear sputum and denies hemoptysis. He hasn't had any fevers, chills or nightsweats. He denies change in hearing or vision. He denies generalized arthralgias and myalgias but has had right shoulder discomfort and back pain (which is chronic). He is still doing CIC 3x per day. He states his urine is clear  And denies visible hematuria or cola colored urine. His edema is stable, maybe slightly improved He has a few pruritic lesions on his right leg ? Whether due to insect bite. He did scrape his left arm on his car door. His appetite is good and he denies nausea, vomiting, diarrhea, dysgeusia or singultus.     Review of Systems   Constitutional: Negative for chills, fever and weight loss.   HENT: Negative for hearing loss.    Eyes: Negative for blurred vision.   Respiratory: Positive for shortness of breath. Negative for hemoptysis.    Cardiovascular: Positive for leg swelling. Negative for chest pain and orthopnea.   Gastrointestinal: Negative for abdominal pain, diarrhea, nausea and vomiting.   Genitourinary: Negative for dysuria, flank pain, hematuria and urgency.   Musculoskeletal: Positive for back pain.      All systems reviewed and are negative except as listed above.      PAST MEDICAL HISTORY:  Past Medical History:   Diagnosis Date   ??? ANCA-associated vasculitis (CMS-HCC)    ??? Arthritis     osteoarthritis w/ radiculopathy left leg   ??? BPH (benign prostatic hypertrophy)    ??? CAD (coronary artery disease)     CBAG X 3 2005; + stress test, denies MI   ??? Cancer (CMS-HCC)     right papillary urothelial carcinoma, low grade    ??? GERD (gastroesophageal reflux disease)    ??? HTN (hypertension)    ??? Wegener's disease, pulmonary (CMS-HCC)        ALLERGIES  Clonazepam    MEDICATIONS:  Current Outpatient Medications   Medication Sig Dispense Refill   ??? acetaminophen (TYLENOL) 325 MG tablet Take 650 mg by mouth every six (6) hours as needed for pain.      ???  amLODIPine (NORVASC) 10 MG tablet Take 1 tablet (10 mg total) by mouth nightly. 90 tablet 3   ??? atorvastatin (LIPITOR) 20 MG tablet Take 10 mg by mouth daily. (patient takes once every 2-3 days)     ??? cholecalciferol, vitamin D3, (CHOLECALCIFEROL) 1,000 unit tablet Take 1,000 Units by mouth daily.      ??? clonazePAM (KLONOPIN) 1 MG tablet Take 1 mg by mouth nightly as needed for anxiety or sleep.      ??? dapsone 100 MG tablet Take 1 tablet (100 mg total) by mouth daily. 30 tablet 2   ??? fluticasone (FLONASE) 50 mcg/actuation nasal spray 1 spray by Each Nare route daily.      ??? furosemide (LASIX) 20 MG tablet Take 1 tablet (20 mg total) by mouth Two (2) times a day. 60 tablet 11   ??? losartan (COZAAR) 25 MG tablet Take 25 mg by mouth daily.     ??? mycophenolate (CELLCEPT) 250 mg capsule Take 2 capsules (500 mg total) by mouth two (2) times a day. 120 capsule 11   ??? niacin 500 MG tablet Take 500 mg by mouth daily with breakfast.     ??? pantoprazole (PROTONIX) 40 MG tablet Take 1 tablet (40 mg total) by mouth daily. 90 tablet 3   ??? predniSONE (DELTASONE) 10 MG tablet Take 2 tablets (20 mg total) by mouth daily. (Patient taking differently: Take 20 mg by mouth daily. ) 60 tablet 5   ??? tamsulosin (FLOMAX) 0.4 mg capsule Take 1 capsule (0.4 mg total) by mouth daily. 90 capsule 3     No current facility-administered medications for this visit.        PHYSICAL EXAM:    Vitals:    08/07/19 0927   BP: 132/68   Pulse: 82   Resp: 16   Temp: 36.2 ??C (97.2 ??F)     Physical Exam  Constitutional:       Appearance: Normal appearance.   HENT:      Head: Normocephalic and atraumatic.      Right Ear: Tympanic membrane and ear canal normal.      Left Ear: Tympanic membrane and ear canal normal.   Eyes:      Extraocular Movements: Extraocular movements intact.      Conjunctiva/sclera: Conjunctivae normal.   Neck:      Musculoskeletal: Normal range of motion and neck supple.   Cardiovascular:      Rate and Rhythm: Normal rate.      Heart sounds: No murmur. No gallop.    Pulmonary:      Breath sounds: Normal breath sounds. No wheezing or rhonchi.   Abdominal:      Tenderness: There is no abdominal tenderness. There is no right CVA tenderness, left CVA tenderness or guarding.   Musculoskeletal:      Right lower leg: Edema present.      Left lower leg: Edema present.   Neurological:      Mental Status: He is alert.         MEDICAL DECISION MAKING      Recent Results (from the past 340 hour(s))   POCT Urinalysis Dipstick    Collection Time: 08/07/19  9:35 AM   Result Value Ref Range    Spec Gravity/POC 1.010 1.003 - 1.030    PH/POC 5.5 5.0 - 9.0    Leuk Esterase/POC 1+ (A) Negative    Nitrite/POC Negative Negative    Protein/POC Negative Negative    UA Glucose/POC Negative Negative  Ketones, POC Negative Negative    Bilirubin/POC Negative Negative    Blood/POC Trace-lysed (A) Negative    Urobilinogen/POC 0.2 0.2 - 1.0 mg/dL   Protein/Creatinine Ratio, Urine    Collection Time: 08/07/19 10:02 AM   Result Value Ref Range    Creat U 30.6 Undefined mg/dL    Protein, Ur 29.5 Undefined mg/dL    Protein/Creatinine Ratio, Urine 0.343 Undefined   Anti-neutrophilic Cytoplasmic Antibody (ANCA)    Collection Time: 08/07/19 10:10 AM   Result Value Ref Range    ANCA Screen      ANCA IFA Positive, Cytoplasmic Pattern (A) Negative    MPO-Elisa Negative Negative    MPO-Quant 6.1 <21.0 U/mL    PR3 Elisa Low Positive (A) Negative    PR3-Quant 27.9 (H) <21.0 U/mL   Renal Function Panel    Collection Time: 08/07/19 10:10 AM   Result Value Ref Range    Sodium 137 135 - 145 mmol/L    Potassium 4.8 3.5 - 5.0 mmol/L    Chloride 102 98 - 107 mmol/L    CO2 26.0 22.0 - 30.0 mmol/L    Anion Gap 9 7 - 15 mmol/L    BUN 40 (H) 7 - 21 mg/dL    Creatinine 6.21 (H) 0.70 - 1.30 mg/dL    BUN/Creatinine Ratio 15     EGFR CKD-EPI Non-African American, Male 23 (L) >=60 mL/min/1.55m2    EGFR CKD-EPI African American, Male 6 (L) >=60 mL/min/1.26m2    Glucose 121 70 - 179 mg/dL    Calcium 9.1 8.5 - 30.8 mg/dL    Phosphorus 3.9 2.9 - 4.7 mg/dL    Albumin 3.9 3.5 - 5.0 g/dL   CBC    Collection Time: 08/07/19 10:10 AM   Result Value Ref Range    WBC 8.7 4.5 - 11.0 10*9/L    RBC 2.76 (L) 4.50 - 5.90 10*12/L    HGB 9.4 (L) 13.5 - 17.5 g/dL    HCT 65.7 (L) 84.6 - 53.0 %    MCV 109.7 (H) 80.0 - 100.0 fL    MCH 34.1 (H) 26.0 - 34.0 pg    MCHC 31.1 31.0 - 37.0 g/dL    RDW 96.2 (H) 95.2 - 15.0 %    MPV 8.1 7.0 - 10.0 fL    Platelet 296 150 - 440 10*9/L       ASSESSMENT/PLAN:      Nathaniel Ruiz is a 73 y.o. year old patient with a past medical history significant for ANCA associated vasculitis and  low grade papillary tumor who is being seen for follow up visit.   Marland Kitchen   1. ANCA associated vasculitis -  Recent AKI and pulmonary hemorrhage associated with ANCA associated vasculitis. He denies further  hemoptysis. His DOE is improved but still having DOE. I ordered chest xray to evaluate but patient left without having it performed. Part of his DOE may be associated with anemia although hemoglobin has improved since last check. If his DOE does not continue to impove would ensure he undergoes chest xray and potentially other issues depending on results.    His urine has occasional WBC/HPF, no casts, no RBC's. Creatinine slightly up from last check, but urine protein and  ANCA titer has continued to decrease. Will decrease prednisone further to 5 mg daily. One 6 weeks out from second rituximab dose will taper cellcept therapy.     2. Urological - recent cystoscopy negative.     3. CV -  Edema is better  per report. Discussed need to follow up with cardiology as he was supposed to have repeat echo performed. He remains off eliquis s/p pulmonary hemorrhage per cardiologist.    4. Heme:   Previous CBC smear with myelocytes. Hematology consult obtained and felt to not to be significant.

## 2019-08-08 LAB — ANTI-NEUTROPHILIC CYTOPLASMIC ANTIBODY
MPO-ELISA: NEGATIVE
MPO-QUANT: 6.1 U/mL (ref <21.0)
PR3-QUANT: 27.9 U/mL — ABNORMAL HIGH (ref <21.0)

## 2019-08-08 LAB — PR3 ELISA: Lab: POSITIVE — AB

## 2019-08-15 ENCOUNTER — Ambulatory Visit (INDEPENDENT_AMBULATORY_CARE_PROVIDER_SITE_OTHER): Payer: Medicare Other

## 2019-08-15 ENCOUNTER — Other Ambulatory Visit: Payer: Self-pay | Admitting: *Deleted

## 2019-08-15 ENCOUNTER — Other Ambulatory Visit: Payer: Medicare Other

## 2019-08-15 ENCOUNTER — Telehealth: Payer: Self-pay

## 2019-08-15 ENCOUNTER — Other Ambulatory Visit: Payer: Self-pay

## 2019-08-15 DIAGNOSIS — R0602 Shortness of breath: Secondary | ICD-10-CM

## 2019-08-15 DIAGNOSIS — M7989 Other specified soft tissue disorders: Secondary | ICD-10-CM | POA: Diagnosis not present

## 2019-08-15 DIAGNOSIS — I5032 Chronic diastolic (congestive) heart failure: Secondary | ICD-10-CM

## 2019-08-15 MED ORDER — PERFLUTREN LIPID MICROSPHERE
1.0000 mL | INTRAVENOUS | Status: AC | PRN
  Administered 2019-08-15: 2 mL via INTRAVENOUS

## 2019-08-15 NOTE — Telephone Encounter (Signed)
Spoke with pts wife regarding appt on 08/18/19. Pts wife was advise to have pt check his vitals prior to his appt. Pts wife stated she will set up pts MyChart account and call me if she has any questions.

## 2019-08-18 ENCOUNTER — Encounter: Payer: Self-pay | Admitting: Internal Medicine

## 2019-08-18 ENCOUNTER — Telehealth (INDEPENDENT_AMBULATORY_CARE_PROVIDER_SITE_OTHER): Payer: Medicare Other | Admitting: Internal Medicine

## 2019-08-18 VITALS — Ht 69.0 in | Wt 232.0 lb

## 2019-08-18 DIAGNOSIS — I4821 Permanent atrial fibrillation: Secondary | ICD-10-CM

## 2019-08-18 DIAGNOSIS — I441 Atrioventricular block, second degree: Secondary | ICD-10-CM | POA: Diagnosis not present

## 2019-08-18 DIAGNOSIS — Z7901 Long term (current) use of anticoagulants: Secondary | ICD-10-CM

## 2019-08-18 NOTE — Progress Notes (Signed)
Electrophysiology TeleHealth Note  Due to national recommendations of social distancing due to [place] 19, an audio telehealth visit is felt to be most appropriate for this patient at this time.  Verbal consent was obtained by me for the telehealth visit today.  The patient does not have capability for a virtual visit.  A phone visit is therefore required today.   Date:  08/18/2019   ID:  Jesus Hendrix, DOB 11-25-1946, MRN [id number]  Location: patient's home  Provider location:  [place]  Evaluation Performed: Follow-up visit  PCP:  Maryland Pink, MD   Electrophysiologist:  Dr Rayann Heman  Chief Complaint:  edema  History of Present Illness:    Jesus Hendrix is a 73 y.o. male who presents via telehealth conferencing today.  Since last being seen in our clinic, the patient reports doing very well.  Today, he denies symptoms of palpitations, chest pain, shortness of breath,  lower extremity edema, dizziness, presyncope, or syncope.  He has edema.  He reports being on high dose steroids recently for his autoimmune disease and attributes swelling to this.  The patient is otherwise without complaint today.  He was taken off of eliquis at [facility] due to hemoptysis.  Past Medical History:  Diagnosis Date  . Anxiety   . Aortic stenosis    a. mild on echo 04/2014  . Atrial fibrillation ([place]) 06/06/2016   a. newly diagnosed 06/06/2016; b. CHADS2VASc => 5 (CHF, HTN, age x 1, DM, vascular disease)  . Choledocholithiasis   . Chronic kidney disease   . Chronic systolic CHF (congestive heart failure) ([place])    a. echo 04/2014: EF 45-50%, nl LV dia fxn, mild AS, LA mildly dilated, PASP nl  . COPD (chronic obstructive pulmonary disease) ([place])    Not on home oxygen  . Coronary artery disease    a. status post CABG in 2007; b. nuc stress test 2014: small region of mild ischemia in the inferior territory, EF 61%, no ischemia, scan similar to prior scan in 2009  . Exogenous obesity   . GERD  (gastroesophageal reflux disease)   . Gout   . Hiatal hernia   . Hip pain, chronic 08/03/2016   Waiting to have hip replacement pending NM stress test results per patient.  . History of colonic diverticulitis   . History of renal cell carcinoma    a. s/p laser ablation   . Hyperlipidemia   . Hypertension   . Near syncope    a. 3 separate events in 2017  . Nephrolithiasis   . Panic attacks   . Spinal stenosis   . Symptomatic bradycardia    s/p [employer] Quarda Allure MP RF model [place] [id number] (serial  Number [id number]) biventricular pacemaker. 04/05/18  . Temporomandibular joint pain dysfunction syndrome   . Umbilical hernia   . Vitamin D deficiency   . Wegener's granulomatosis with renal involvement [facility])     Past Surgical History:  Procedure Laterality Date  . CARDIAC CATHETERIZATION    . CHOLECYSTECTOMY    . COLONOSCOPY N/A 06/03/2015   Procedure: COLONOSCOPY;  Surgeon: Lucilla Lame, MD;  Location: [place];  Service: Gastroenterology;  Laterality: N/A;  . CORONARY ARTERY BYPASS GRAFT  2004   CABG x 3  . EYE SURGERY    . HERNIA REPAIR     x 2  . HIP SURGERY     right hip replacement  . INTRAOCULAR LENS INSERTION    . KIDNEY SURGERY  cancer  . NOSE SURGERY    . PACEMAKER IMPLANT N/A 04/05/2018   SJM Biventricular pacemaker implanted by Dr Rayann Heman for second degree AV block, EF < 50% and permanent afib  . POLYPECTOMY  06/03/2015   Procedure: POLYPECTOMY;  Surgeon: Lucilla Lame, MD;  Location: [place];  Service: Gastroenterology;;  . PROSTATE SURGERY    . TOTAL HIP ARTHROPLASTY Left 12/19/2016   Procedure: LEFT TOTAL HIP ARTHROPLASTY;  Surgeon: Garald Balding, MD;  Location: [place];  Service: Orthopedics;  Laterality: Left;    Current Outpatient Medications  Medication Sig Dispense Refill  . acetaminophen (TYLENOL) 325 MG tablet Take 1 tablet by mouth as needed for pain.    . Cholecalciferol (VITAMIN D) 2000 units CAPS Take 2,000 Units by mouth  at bedtime.     . clonazePAM (KLONOPIN) 1 MG tablet Take 1 tablet by mouth as needed for sleep.    . furosemide (LASIX) 20 MG tablet Take 1 tablet (20 mg total) by mouth daily. (Patient taking differently: Take 40 mg by mouth daily. ) 90 tablet 3  . losartan (COZAAR) 25 MG tablet Take 1 tablet (25 mg total) by mouth daily. 90 tablet 3  . mycophenolate (CELLCEPT) 250 MG capsule Take 2 capsules (500 mg) by mouth twice daily    . pantoprazole (PROTONIX) 40 MG tablet Take 1 tablet by mouth daily.    . predniSONE (DELTASONE) 10 MG tablet Take 0.5 tablets by mouth daily.     No current facility-administered medications for this visit.     Allergies:   Clonazepam   Social History:  The patient  reports that he quit smoking about 44 years ago. His smoking use included cigarettes. He has a 12.50 pack-year smoking history. He has never used smokeless tobacco. He reports that he does not drink alcohol or use drugs.   Family History:  The patient's family history includes Heart disease in his brother, mother, and sister.   ROS:  Please see the history of present illness.   All other systems are personally reviewed and negative.    Exam:    Vital Signs:  Ht 5\' 9"  (1.753 m)   Wt 232 lb (105.2 kg)   BMI 34.26 kg/m   Well sounding , poor hearing  Labs/Other Tests and Data Reviewed:    Recent Labs: 07/10/2019: ALT 33; BUN 41; Creatinine, Ser 2.31; Potassium 4.4; Sodium 140   Wt Readings from Last 3 Encounters:  08/18/19 232 lb (105.2 kg)  07/10/19 232 lb 8 oz (105.5 kg)  01/30/19 216 lb (98 kg)     Last device remote is reviewed from [facility] [employer] which reveals normal device function   ASSESSMENT & PLAN:    1.  Second degree AV Block Remotes are not uptodate.  I have asked device clinic to assist with this. Normal BiV pacemaker function by 04/2019 remote  2. Permanent afib eliquis was discontinued due to hemoptysis. Per Dr Rockey Situ, plan is to restart eliquis once Hb is > 10.  The patient  did not follow up with CBC as advised.  I have encouraged him to follow-up with Dr Donivan Scull office for CBC and to resume eliquis if Hb >10  Follow-up:  Follow-up with Dr Donivan Scull team as scheduled Return to see EP APP in a year   Patient Risk:  after full review of this patients clinical status, I feel that they are at moderate risk at this time.  Today, I have spent 15 minutes with the patient with  telehealth technology discussing arrhythmia management .    Army Fossa, MD  08/18/2019 2:59 PM     [place] [address] [place] [address] [phone] (office) [phone] (fax)

## 2019-08-19 ENCOUNTER — Ambulatory Visit (INDEPENDENT_AMBULATORY_CARE_PROVIDER_SITE_OTHER): Payer: Medicare Other | Admitting: *Deleted

## 2019-08-19 ENCOUNTER — Telehealth: Payer: Self-pay | Admitting: Cardiology

## 2019-08-19 DIAGNOSIS — I5032 Chronic diastolic (congestive) heart failure: Secondary | ICD-10-CM | POA: Diagnosis not present

## 2019-08-19 DIAGNOSIS — I442 Atrioventricular block, complete: Secondary | ICD-10-CM

## 2019-08-19 LAB — CUP PACEART REMOTE DEVICE CHECK
Battery Remaining Longevity: 103 mo
Battery Remaining Percentage: 95.5 %
Battery Voltage: 2.99 V
Date Time Interrogation Session: 20200901151412
Implantable Lead Implant Date: 20190419
Implantable Lead Implant Date: 20190419
Implantable Lead Location: 753858
Implantable Lead Location: 753860
Implantable Pulse Generator Implant Date: 20190419
Lead Channel Impedance Value: 400 Ohm
Lead Channel Impedance Value: 550 Ohm
Lead Channel Pacing Threshold Amplitude: 0.75 V
Lead Channel Pacing Threshold Amplitude: 1 V
Lead Channel Pacing Threshold Pulse Width: 0.5 ms
Lead Channel Pacing Threshold Pulse Width: 0.5 ms
Lead Channel Sensing Intrinsic Amplitude: 10.2 mV
Lead Channel Setting Pacing Amplitude: 2 V
Lead Channel Setting Pacing Amplitude: 2.5 V
Lead Channel Setting Pacing Pulse Width: 0.5 ms
Lead Channel Setting Pacing Pulse Width: 0.5 ms
Lead Channel Setting Sensing Sensitivity: 4 mV
Pulse Gen Model: 3262
Pulse Gen Serial Number: 9017264

## 2019-08-19 NOTE — Telephone Encounter (Signed)
-----   Message from [name], MD sent at 08/18/2019  3:07 PM EDT ----- Remotes need to be restarted I have asked them to send a manual transmission today.  Please try to get him back on track with remotes.

## 2019-08-19 NOTE — Telephone Encounter (Signed)
Spoke w/ pt and his wife and requested a manual transmission per MD recommendations. Transmission received. Remote scheduled for 08/19/2019.

## 2019-08-20 ENCOUNTER — Telehealth: Payer: Self-pay

## 2019-08-20 NOTE — Telephone Encounter (Signed)
-----   Message from Minna Merritts, MD sent at 08/19/2019  9:54 PM EDT ----- Regarding: cbc [name], can we arrange for CBC  thx Mardene Sayer ----- Message ----- From: [name], MD Sent: 08/18/2019   3:06 PM EDT To: Minna Merritts, MD  He has not had his CBC yet as you advised in your last note.   I have advised that he have it at your office and restart eliquis if hemaglobin is above 10.  Can you and your nurse assist with this.

## 2019-08-20 NOTE — Telephone Encounter (Signed)
Spoke to wife about need for blood work. She verbalized understanding and will instruct him [facility].   Advised pt to call for any further questions or concerns. Order active in [facility].

## 2019-08-27 ENCOUNTER — Telehealth: Payer: Self-pay | Admitting: *Deleted

## 2019-08-27 DIAGNOSIS — I4821 Permanent atrial fibrillation: Secondary | ICD-10-CM

## 2019-08-27 NOTE — Telephone Encounter (Signed)
Spoke with patients wife per release form and reviewed information. She has spoke with her husband and requested that he get the labs done but has not done that yet. Added additional lab requested by Dr. Rockey Situ and requested that he go to [facility] in order to have those labs done. Confirmed dose of furosemide and she states that he is now taking Furosemide 20 mg twice a day which was increased by physician at [facility] due to lower extremity swelling. I did review that he could take it early in the morning and then again after lunch around 2 or 3 pm so he is not up throughout the night. She verbalized understanding of our conversation, would try to encourage him to go get those done, and had no further questions at this time.

## 2019-08-27 NOTE — Telephone Encounter (Signed)
-----   Message from Minna Merritts, MD sent at 08/25/2019  3:41 PM EDT ----- Echocardiogram with depressed ejection fraction, Moderately elevated right heart pressures Valvular regurgitation consistent with fluid overload  Can we make sure he is taking his Lasix 20 daily Can we add BMP to his labs that are pending Also needs his CBC

## 2019-08-28 ENCOUNTER — Other Ambulatory Visit
Admission: RE | Admit: 2019-08-28 | Discharge: 2019-08-28 | Disposition: A | Discharge status: Home / Self Care | Payer: Medicare Other | Source: Ambulatory Visit | Attending: Cardiovascular Disease | Admitting: Cardiovascular Disease

## 2019-08-28 DIAGNOSIS — R0602 Shortness of breath: Secondary | ICD-10-CM | POA: Insufficient documentation

## 2019-08-28 DIAGNOSIS — I4821 Permanent atrial fibrillation: Secondary | ICD-10-CM

## 2019-08-28 DIAGNOSIS — M7989 Other specified soft tissue disorders: Secondary | ICD-10-CM | POA: Insufficient documentation

## 2019-08-28 DIAGNOSIS — I5032 Chronic diastolic (congestive) heart failure: Secondary | ICD-10-CM | POA: Insufficient documentation

## 2019-08-28 LAB — CBC WITH DIFFERENTIAL/PLATELET
Abs Immature Granulocytes: 0.24 10*3/uL — ABNORMAL HIGH (ref 0.00–0.07)
Basophils Absolute: 0 10*3/uL (ref 0.0–0.1)
Basophils Relative: 1 %
Eosinophils Absolute: 0.2 10*3/uL (ref 0.0–0.5)
Eosinophils Relative: 3 %
HCT: 27.8 % — ABNORMAL LOW (ref 39.0–52.0)
Hemoglobin: 8.8 g/dL — ABNORMAL LOW (ref 13.0–17.0)
Immature Granulocytes: 4 %
Lymphocytes Relative: 15 %
Lymphs Abs: 0.9 10*3/uL (ref 0.7–4.0)
MCH: 34.1 pg — ABNORMAL HIGH (ref 26.0–34.0)
MCHC: 31.7 g/dL (ref 30.0–36.0)
MCV: 107.8 fL — ABNORMAL HIGH (ref 80.0–100.0)
Monocytes Absolute: 0.6 10*3/uL (ref 0.1–1.0)
Monocytes Relative: 9 %
Neutro Abs: 4.2 10*3/uL (ref 1.7–7.7)
Neutrophils Relative %: 68 %
Platelets: 250 10*3/uL (ref 150–400)
RBC: 2.58 MIL/uL — ABNORMAL LOW (ref 4.22–5.81)
RDW: 15.9 % — ABNORMAL HIGH (ref 11.5–15.5)
WBC: 6.1 10*3/uL (ref 4.0–10.5)
nRBC: 0.3 % — ABNORMAL HIGH (ref 0.0–0.2)

## 2019-08-28 LAB — BASIC METABOLIC PANEL
Anion gap: 9 (ref 5–15)
BUN: 48 mg/dL — ABNORMAL HIGH (ref 8–23)
CO2: 22 mmol/L (ref 22–32)
Calcium: 9 mg/dL (ref 8.9–10.3)
Chloride: 108 mmol/L (ref 98–111)
Creatinine, Ser: 2.54 mg/dL — ABNORMAL HIGH (ref 0.61–1.24)
GFR calc Af Amer: 28 mL/min — ABNORMAL LOW (ref >60)
GFR calc non Af Amer: 24 mL/min — ABNORMAL LOW (ref >60)
Glucose, Bld: 104 mg/dL — ABNORMAL HIGH (ref 70–99)
Potassium: 4.3 mmol/L (ref 3.5–5.1)
Sodium: 139 mmol/L (ref 135–145)

## 2019-08-28 NOTE — Telephone Encounter (Signed)
HBG still lower, 8.8 (dropped a little). Given the anemia, I would be concerned to restart the eliquis Will cc Dr. Rayann Heman. Most of his care is at [facility] (rheum, nephrology, hemaytology, etc) Stay on the lasix 20 BID for now Elevated Creatinine in the setting of wegeners He will feel SOB. Tired from anemia.  Perhaps nephrology could give EPO. On next lab visit with any provider, could do anemia w/u, iron stidies etc

## 2019-08-28 NOTE — Telephone Encounter (Signed)
Labs completed and resulted sent to MD for review.

## 2019-08-29 NOTE — Telephone Encounter (Signed)
Call to patient, spoke to wife, Kermit Balo. Okay per DPR. Reviewed lab results and POC from Dr. Rockey Situ. She verbalized understanding and had no further questions at this time.   Advised pt to call for any further questions or concerns.

## 2019-09-02 NOTE — Progress Notes (Signed)
Remote pacemaker transmission.   

## 2019-09-04 ENCOUNTER — Encounter: Payer: Self-pay | Admitting: Cardiovascular Disease

## 2019-09-05 ENCOUNTER — Telehealth: Payer: Self-pay | Admitting: *Deleted

## 2019-09-05 NOTE — Telephone Encounter (Signed)
Left voicemail message to call back for review of results and recommendations.  

## 2019-09-05 NOTE — Telephone Encounter (Signed)
Patient wife, Kermit Balo is returning your call.

## 2019-09-05 NOTE — Telephone Encounter (Signed)
Spoke with patients wife per release form and reviewed results and recommendations from recent lab work. Instructed her to have him decrease furosemide back down to 20 mg once daily and to reach out to [facility] regarding the anemia. She verbalized understanding of instructions and had no further questions at this time.

## 2019-09-05 NOTE — Telephone Encounter (Signed)
-----   Message from Minna Merritts, MD sent at 09/03/2019  3:57 PM EDT ----- Lab work from last week Still anemic, needs to get in touch with his team at [facility] Renal function high end of his range creatinine 2.54 If he is on Lasix 20 twice daily would decrease back to 20 daily

## 2019-09-10 ENCOUNTER — Ambulatory Visit: Payer: Medicare Other | Admitting: Nurse Practitioner

## 2019-09-11 ENCOUNTER — Ambulatory Visit: Admit: 2019-09-11 | Discharge: 2019-09-12 | Payer: MEDICARE

## 2019-09-11 DIAGNOSIS — M359 Systemic involvement of connective tissue, unspecified: Secondary | ICD-10-CM

## 2019-09-11 DIAGNOSIS — N189 Chronic kidney disease, unspecified: Secondary | ICD-10-CM

## 2019-09-11 DIAGNOSIS — N184 Chronic kidney disease, stage 4 (severe): Secondary | ICD-10-CM

## 2019-09-11 DIAGNOSIS — D631 Anemia in chronic kidney disease: Secondary | ICD-10-CM

## 2019-09-11 DIAGNOSIS — I776 Arteritis, unspecified: Secondary | ICD-10-CM

## 2019-09-11 DIAGNOSIS — D899 Disorder involving the immune mechanism, unspecified: Secondary | ICD-10-CM

## 2019-09-17 ENCOUNTER — Telehealth: Payer: Self-pay | Admitting: Cardiovascular Disease

## 2019-09-17 MED ORDER — TAMSULOSIN 0.4 MG CAPSULE
ORAL_CAPSULE | 3 refills | 0 days | Status: CP
Start: 2019-09-17 — End: ?

## 2019-09-17 NOTE — Telephone Encounter (Signed)
BNP is not particularly elevated Okay to be on Lasix 20 twice daily Leg swelling will certainly be exacerbated by anemia Would strongly recommend he take his medications  On last office visit was not taking half of his list .  It is very important. Would follow the discharge medication list from [facility]

## 2019-09-17 NOTE — Telephone Encounter (Signed)
I spoke with the patient's wife (ok per DPR).  She states the patient had lab work with his PCP- Dr. Kary Kos ~ 2 weeks ago and they were just called with results. She states she was told the patient had a BNP level drawn and this was 232. I cannot see these labs in [place]. I have placed a call to Dr. Barbarann Ehlers office to please fax these.   Per Mrs. Hoelting, the patient was admitted in July to [facility] with a flare up of his Wegeners disease and ended up with a major bleed. He has been on high dose prednisone since then and finally weaned off of this this week. He is currently on cellcept. He was seen by his doctor at [facility] recently and was told he is still severely anemic with a Hgb at 8.0. She wanted to admit him overnight for a blood transfusion, but the patient refused.   The patient saw Dr. Kary Kos 2 weeks ago for swelling in his feet/ legs. This is currently unchanged for him. His SOB comes and goes.   Per Mrs. Bidwell, the doctor at [facility] put the patient on lasix 20 mg BID, then Dr. Rockey Situ dropped it back down to 20 mg QD, then when they saw [facility] again last week, the doctor there increased it back to 20 mg BID. They are confused as to what to do with his meds.  I have advised Mrs. Hausner that with the patient's long term steroid use and persistent anemia, this is probably contributing to his swelling. I advised I am unsure how much his swelling will go down until his anemia improves.  She is aware I will get the patient's labs from Dr. Kary Kos and also review the above information with Dr. Rockey Situ and call her back.  She voices understanding and is agreeable.

## 2019-09-17 NOTE — Telephone Encounter (Signed)
I called and spoke with the patient's wife. She is aware of Dr. Donivan Scull recommendations and voices understanding.

## 2019-09-17 NOTE — Telephone Encounter (Signed)
Patient's wife wanted Dr. Rockey Situ to know his PCP states his BMP level is 232. Please call to discuss.

## 2019-09-17 NOTE — Unmapped (Signed)
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 The patient was admitted with pulmonary hemorrhage and acute on CKD in early July. Notably he had been off immunosuppressant therapy for several years. He was treated with with pulse solumedrol, rituximab x1 and PLEX. He was discharged on prednisone 40 mg and cellcept 500 BID. The patient had developed edema and self discontinued amlodipine after discharge and restarted ARB therapy. He underwent second rituximab infusion 07/18/2019.     HPI:  Nathaniel Ruiz is a 73  year old man with ANCA associated vasculitis who presents for follow up visit. The patient states he has continued to feel weak and although his DOE has improved it is still present. He experienced a fall without loss of consciousness but due to weakness/tripped. He suffered a few abrasions. He denies preceding chest pain, dizziness, presyncope, nausea or diaphoresis.  In the interim he saw his cardiologist who told him his blood count was low per the patient. I was unable to find note from the provider in care everywhere. He was also seen by his PCP who repeated labs and his hemoglobin was still low albeit improved. He was supposed to undergo a chest Xray per his PCP but did not have this done yet.     He has a slight cough productive of clear sputum and he denies further hemoptysis. He states his breathing is better but still has DOE. He denies any chest pain, SOB at rest, orthopnea or PND. His edema is still presents but stable. He denies fevers, chills, and night sweats.  He states his appetite is okay and he denies any nausea, vomiting or diarrhea. He denies any myalgias or arthralgias, or oral ulcers. He denies any rashes and states his urine is clear without blood and denies cola colored urine. He has been doing CIC 3x per day.       Review of Systems   Constitutional: Positive for malaise/fatigue. Negative for chills and fever.   HENT: Negative for nosebleeds.    Respiratory: Positive for cough. Negative for hemoptysis.    Cardiovascular: Positive for leg swelling. Negative for chest pain and orthopnea.   Gastrointestinal: Negative for abdominal pain, diarrhea, nausea and vomiting.   Genitourinary: Negative for dysuria, flank pain, hematuria and urgency.   Musculoskeletal: Positive for falls. Negative for joint pain and myalgias.   Skin: Negative for rash.   Neurological: Positive for weakness. Negative for dizziness and focal weakness.        All systems reviewed and are negative except as listed above.      PAST MEDICAL HISTORY:  Past Medical History:   Diagnosis Date   ??? ANCA-associated vasculitis (CMS-HCC)    ??? Arthritis     osteoarthritis w/ radiculopathy left leg   ??? BPH (benign prostatic hypertrophy)    ??? CAD (coronary artery disease)     CBAG X 3 2005; + stress test, denies MI   ??? Cancer (CMS-HCC)     right papillary urothelial carcinoma, low grade    ??? GERD (gastroesophageal reflux disease)    ??? HTN (hypertension)    ??? Wegener's disease, pulmonary (CMS-HCC)        ALLERGIES  Clonazepam    MEDICATIONS:  Current Outpatient Medications   Medication Sig Dispense Refill   ???  acetaminophen (TYLENOL) 325 MG tablet Take 650 mg by mouth every six (6) hours as needed for pain.      ??? ALPRAZolam (XANAX) 0.5 MG tablet Take 0.5 mg by mouth.     ??? amLODIPine (NORVASC) 10 MG tablet Take 1 tablet (10 mg total) by mouth nightly. 90 tablet 3   ??? atorvastatin (LIPITOR) 20 MG tablet Take 10 mg by mouth daily. (patient takes once every 2-3 days)     ??? cholecalciferol, vitamin D3, (CHOLECALCIFEROL) 1,000 unit tablet Take 1,000 Units by mouth daily.      ??? clonazePAM (KLONOPIN) 1 MG tablet Take 1 mg by mouth nightly as needed for anxiety or sleep.      ??? dapsone 100 MG tablet Take 1 tablet (100 mg total) by mouth daily. 30 tablet 2   ??? fluticasone (FLONASE) 50 mcg/actuation nasal spray 1 spray by Each Nare route daily.      ??? furosemide (LASIX) 20 MG tablet Take 1 tablet (20 mg total) by mouth Two (2) times a day. 60 tablet 11   ??? losartan (COZAAR) 25 MG tablet Take 25 mg by mouth daily.     ??? mycophenolate (CELLCEPT) 250 mg capsule Take 2 capsules (500 mg total) by mouth two (2) times a day. 120 capsule 11   ??? niacin 500 MG tablet Take 500 mg by mouth daily with breakfast.     ??? pantoprazole (PROTONIX) 40 MG tablet Take 1 tablet (40 mg total) by mouth daily. 90 tablet 3   ??? predniSONE (DELTASONE) 10 MG tablet Take 2 tablets (20 mg total) by mouth daily. (Patient taking differently: Take 20 mg by mouth daily. ) 60 tablet 5   ??? tamsulosin (FLOMAX) 0.4 mg capsule TAKE 1 CAPSULE BY MOUTH EVERY DAY 90 capsule 3     No current facility-administered medications for this visit.        PHYSICAL EXAM:    Vitals:    09/11/19 1420   BP: 152/76   Pulse: 72   Temp: 36.8 ??C (98.2 ??F)       CONSTITUTIONAL: Pleasant man in NAD.   HEAD: [place]/AT  EYES: Extra ocular movements intact. sclerae anicteric.   NECK: Supple, no lymphadenopathy  CARDIOVASCULAR: Irregular, 3/6 SEM unchanged, no rubs.   PULM: Clear to auscultation bilaterally. No wheezing or rhonchi appreciated  GASTROINTESTINAL: Soft, active bowel sounds, nontender, large ventral hernia. No CVAT .      EXTREMITIES: 3+ lower extremity edema feet, about 1/3 way up calf bilaterally  MS: no synovitis appreciated  Skin: few ecchymotic areas    MEDICAL DECISION MAKING        Recent Results (from the past 340 hour(s))   POCT Urinalysis Dipstick    Collection Time: 09/11/19  2:43 PM   Result Value Ref Range    Spec Gravity/POC 1.015 1.003 - 1.030    PH/POC 5.5 5.0 - 9.0    Leuk Esterase/POC 3+ (A) Negative    Nitrite/POC Negative Negative    Protein/POC 1+ (A) Negative    UA Glucose/POC Negative Negative    Ketones, POC Negative Negative    Bilirubin/POC Negative Negative    Blood/POC 2+ (A) Negative    Urobilinogen/POC 0.2 0.2 - 1.0 mg/dL     Chest Xray: Minimal left basilar linear atelectasis versus scarring. Improved pulmonary vascular congestion and interstitial edema.  ??  No pleural effusion or pneumothorax.  ??  Enlarged cardiac silhouette, similar.  ??  IMPRESSION:  ??  Similar enlargement of the  cardiac silhouette.      ASSESSMENT/PLAN:      Nathaniel Ruiz is a 73 y.o. year old patient with a past medical history significant for ANCA associated vasculitis and  low grade papillary tumor who is being seen for follow up visit.   Marland Kitchen   1. ANCA associated vasculitis -  Patient's chest Xray improved. He did not have blood drawn as instructed but last creatinine drawn at PCP was stable at 2.4. He has had no specific vasculitic symptoms and most recent ANCA titer reduced. I had ordered rituxan panel to check status post infusion but as above did not have blood drawn. I contacted his wife and we discussed decreasing his prednisone further to every other day and then discontinue. After completed taper he will stop dapsone therapy. I would like to have rituxan panel back before discontinuing cellcept therapy.   2. Urological - recent cystoscopy negative.     3. CV -  + edema which is improved since last visit but given DOE would increase lasix back to BID.     4. Heme:   Hematology consult had been performed and recommended referral back to hematology if the patient developed progressive cytopenias. PCP checked B12 and folate both which were normal. I had ordered a repeat CBC as well as iron studies in consideration for ESA therapy but as above patient did not get blood draw. Will contact patient to ensure he returns for blood draw. Discontinuing dapsone made aide anemia as well.

## 2019-09-18 ENCOUNTER — Ambulatory Visit: Admit: 2019-09-18 | Discharge: 2019-09-19 | Payer: MEDICARE

## 2019-09-18 DIAGNOSIS — M359 Systemic involvement of connective tissue, unspecified: Secondary | ICD-10-CM

## 2019-09-18 DIAGNOSIS — N184 Chronic kidney disease, stage 4 (severe): Secondary | ICD-10-CM

## 2019-09-18 DIAGNOSIS — I776 Arteritis, unspecified: Secondary | ICD-10-CM

## 2019-09-18 DIAGNOSIS — D631 Anemia in chronic kidney disease: Secondary | ICD-10-CM

## 2019-09-18 DIAGNOSIS — N189 Chronic kidney disease, unspecified: Secondary | ICD-10-CM

## 2019-09-18 LAB — CBC W/ AUTO DIFF
BASOPHILS ABSOLUTE COUNT: 0.1 10*9/L (ref 0.0–0.1)
BASOPHILS RELATIVE PERCENT: 1.6 %
EOSINOPHILS ABSOLUTE COUNT: 0.3 10*9/L (ref 0.0–0.4)
EOSINOPHILS RELATIVE PERCENT: 5 %
HEMATOCRIT: 35 % — ABNORMAL LOW (ref 41.0–53.0)
HEMOGLOBIN: 11 g/dL — ABNORMAL LOW (ref 13.5–17.5)
LARGE UNSTAINED CELLS: 4 % (ref 0–4)
LYMPHOCYTES ABSOLUTE COUNT: 1.9 10*9/L (ref 1.5–5.0)
LYMPHOCYTES RELATIVE PERCENT: 29.5 %
MEAN CORPUSCULAR HEMOGLOBIN CONC: 31.4 g/dL (ref 31.0–37.0)
MEAN CORPUSCULAR HEMOGLOBIN: 33.7 pg (ref 26.0–34.0)
MEAN CORPUSCULAR VOLUME: 107.5 fL — ABNORMAL HIGH (ref 80.0–100.0)
MEAN PLATELET VOLUME: 8.1 fL (ref 7.0–10.0)
MONOCYTES ABSOLUTE COUNT: 0.4 10*9/L (ref 0.2–0.8)
NEUTROPHILS ABSOLUTE COUNT: 3.5 10*9/L (ref 2.0–7.5)
NEUTROPHILS RELATIVE PERCENT: 53.4 %
RED BLOOD CELL COUNT: 3.26 10*12/L — ABNORMAL LOW (ref 4.50–5.90)
RED CELL DISTRIBUTION WIDTH: 16.6 % — ABNORMAL HIGH (ref 12.0–15.0)
WBC ADJUSTED: 6.5 10*9/L (ref 4.5–11.0)

## 2019-09-18 LAB — RENAL FUNCTION PANEL
ALBUMIN: 4.1 g/dL (ref 3.5–5.0)
ANION GAP: 12 mmol/L (ref 7–15)
BLOOD UREA NITROGEN: 36 mg/dL — ABNORMAL HIGH (ref 7–21)
BUN / CREAT RATIO: 15
CALCIUM: 9.3 mg/dL (ref 8.5–10.2)
CO2: 27 mmol/L (ref 22.0–30.0)
CREATININE: 2.38 mg/dL — ABNORMAL HIGH (ref 0.70–1.30)
EGFR CKD-EPI AA MALE: 30 mL/min/{1.73_m2} — ABNORMAL LOW (ref >=60)
EGFR CKD-EPI NON-AA MALE: 26 mL/min/{1.73_m2} — ABNORMAL LOW (ref >=60)
GLUCOSE RANDOM: 91 mg/dL (ref 70–99)
PHOSPHORUS: 3.7 mg/dL (ref 2.9–4.7)
POTASSIUM: 4.7 mmol/L (ref 3.5–5.0)
SODIUM: 141 mmol/L (ref 135–145)

## 2019-09-18 LAB — RITUXAN WORKUP
ABSOLUTE CD16/56 CNT: 389 {cells}/uL (ref 15–1080)
CD16/56%NK CELL": 18 % (ref 1–27)
CD19% (B CELLS)": <1 % — ABNORMAL LOW (ref 7–23)
CD3% (T CELLS)": 81 % (ref 61–86)
RITUXIN CD45%: 100

## 2019-09-18 LAB — IRON & TIBC
IRON: 98 ug/dL (ref 35–165)
TOTAL IRON BINDING CAPACITY (CALC): 324.6 mg/dL (ref 252.0–479.0)

## 2019-09-18 LAB — SLIDE REVIEW

## 2019-09-18 LAB — RED CELL DISTRIBUTION WIDTH: Lab: 16.6 — ABNORMAL HIGH

## 2019-09-18 LAB — CD19% (B CELLS)": Lab: <1 — ABNORMAL LOW

## 2019-09-18 LAB — TOTAL IRON BINDING CAPACITY (CALC): Lab: 324.6

## 2019-09-18 LAB — BUN / CREAT RATIO: Urea nitrogen/Creatinine:MRto:Pt:Ser/Plas:Qn:: 15

## 2019-09-18 LAB — FERRITIN: Ferritin:MCnc:Pt:Ser/Plas:Qn:: 277

## 2019-09-18 LAB — SMEAR REVIEW

## 2019-09-22 ENCOUNTER — Ambulatory Visit (INDEPENDENT_AMBULATORY_CARE_PROVIDER_SITE_OTHER): Payer: Medicare Other

## 2019-09-22 DIAGNOSIS — Z950 Presence of cardiac pacemaker: Secondary | ICD-10-CM

## 2019-09-22 DIAGNOSIS — I5032 Chronic diastolic (congestive) heart failure: Secondary | ICD-10-CM | POA: Diagnosis not present

## 2019-09-22 LAB — ANTI-NEUTROPHILIC CYTOPLASMIC ANTIBODY
MPO-ELISA: NEGATIVE
PR3 ELISA: POSITIVE — AB

## 2019-09-22 LAB — ANCA SCREEN: Lab: 0

## 2019-09-23 ENCOUNTER — Telehealth: Payer: Self-pay

## 2019-09-23 NOTE — Progress Notes (Signed)
EPIC Encounter for ICM Monitoring  Patient Name: Jesus Hendrix is a 73 y.o. male Date: 09/23/2019 Primary Care Physican: Maryland Pink, MD Primary Cardiologist:Gollan Electrophysiologist:Allred Weight:unknown   Attempted call to patient/wife and unable to reach.  Left detailed message per DPR regarding transmission. Transmission reviewed.   CorVueThoracic impedancenormal.  Decreased impedance 9/8 - 9/13 suggesting possible fluid accumulation.   Prescribed:Furosemide20 mg take 1 tablet by mouth daily.  Labs: 08/28/2019 Creatinine 2.54, BUN 48, Potassium 4.3, Sodium 139, GFR 24-28 07/10/2019 Creatinine 2.31, BUN 41, Potassium 4.4, Sodium 140, GFR 27-31  A complete set of results can be found in Results Review.  Recommendations: Unable to reach.    Follow-up plan: ICM clinic phone appointment on 10/27/2019. Office appt 09/30/2019 with Ignacia Bayley PA.    Copy of ICM check sent to Dr. Rayann Heman.   3 month ICM trend: 09/22/2019    1 Year ICM trend:       [name], RN 09/23/2019 3:46 PM

## 2019-09-23 NOTE — Telephone Encounter (Signed)
Remote ICM transmission received.  Attempted call to wife patient regarding ICM remote transmission and left message to return call.  

## 2019-09-30 ENCOUNTER — Other Ambulatory Visit: Payer: Self-pay

## 2019-09-30 ENCOUNTER — Encounter: Payer: Self-pay | Admitting: Nurse Practitioner

## 2019-09-30 ENCOUNTER — Ambulatory Visit (INDEPENDENT_AMBULATORY_CARE_PROVIDER_SITE_OTHER): Payer: Medicare Other | Admitting: Nurse Practitioner

## 2019-09-30 VITALS — BP 120/76 | HR 68 | Temp 98.8°F | Ht 68.0 in | Wt 236.8 lb

## 2019-09-30 DIAGNOSIS — I10 Essential (primary) hypertension: Secondary | ICD-10-CM | POA: Diagnosis not present

## 2019-09-30 DIAGNOSIS — D539 Nutritional anemia, unspecified: Secondary | ICD-10-CM

## 2019-09-30 DIAGNOSIS — I4821 Permanent atrial fibrillation: Secondary | ICD-10-CM

## 2019-09-30 DIAGNOSIS — I5033 Acute on chronic diastolic (congestive) heart failure: Secondary | ICD-10-CM

## 2019-09-30 DIAGNOSIS — I200 Unstable angina: Secondary | ICD-10-CM

## 2019-09-30 DIAGNOSIS — I350 Nonrheumatic aortic (valve) stenosis: Secondary | ICD-10-CM

## 2019-09-30 DIAGNOSIS — N184 Chronic kidney disease, stage 4 (severe): Secondary | ICD-10-CM

## 2019-09-30 MED ORDER — FUROSEMIDE 20 MG PO TABS: 20.0000 mg | ORAL_TABLET | Freq: Two times a day (BID) | ORAL | 3 refills | Status: DC

## 2019-09-30 NOTE — Patient Instructions (Addendum)
Medication Instructions:  1- INCREASE Lasix to 2 tablets (40mg  total) twice daily for 3 days, then resume Take 1 tablet (20 mg total) by mouth 2 (two) times daily. If you need a refill on your cardiac medications before your next appointment, please call your pharmacy.   Lab work: Your physician recommends that you return for lab work in: [facility] the [facility]. ([facility]) No appt is needed. Hours are M-F 7AM- 6 PM.   If you have labs (blood work) drawn today and your tests are completely normal, you will receive your results only by: Marland Kitchen MyChart Message (if you have MyChart) OR . A paper copy in the mail If you have any lab test that is abnormal or we need to change your treatment, we will call you to review the results.  Testing/Procedures: 1- [place]  Your caregiver has ordered a Stress Test with nuclear imaging. The purpose of this test is to evaluate the blood supply to your heart muscle. This procedure is referred to as a "Non-Invasive Stress Test." This is because other than having an IV started in your vein, nothing is inserted or "invades" your body. Cardiac stress tests are done to find areas of poor blood flow to the heart by determining the extent of coronary artery disease (CAD). Some patients exercise on a treadmill, which naturally increases the blood flow to your heart, while others who are  unable to walk on a treadmill due to physical limitations have a pharmacologic/chemical stress agent called Lexiscan . This medicine will mimic walking on a treadmill by temporarily increasing your coronary blood flow.   Please note: these test may take anywhere between 2-4 hours to complete  PLEASE REPORT TO [place] AT THE FIRST DESK WILL DIRECT YOU WHERE TO GO  Date of Procedure:_____________________________________  Arrival Time for Procedure:______________________________  Instructions regarding medication:   _x___:  Hold other medications as  follows:_____Lasix____________________________________________________________________________________________________________________________________________________________________________________________________________________________________________________________________________________  PLEASE NOTIFY THE OFFICE AT LEAST 24 HOURS IN ADVANCE IF YOU ARE UNABLE TO KEEP YOUR APPOINTMENT.  [phone] AND  PLEASE NOTIFY NUCLEAR MEDICINE AT [facility] AT LEAST 24 HOURS IN ADVANCE IF YOU ARE UNABLE TO KEEP YOUR APPOINTMENT. [phone]  How to prepare for your Myoview test:  1. Do not eat or drink after midnight 2. No caffeine for 24 hours prior to test 3. No smoking 24 hours prior to test. 4. Your medication may be taken with water.  If your doctor stopped a medication because of this test, do not take that medication. 5. Ladies, please do not wear dresses.  Skirts or pants are appropriate. Please wear a short sleeve shirt. 6. No perfume, cologne or lotion. 7. Wear comfortable walking shoes. No heels!     Follow-Up: At [facility], you and your health needs are our priority.  As part of our continuing mission to provide you with exceptional heart care, we have created designated Provider Care Teams.  These Care Teams include your primary Cardiologist (physician) and Advanced Practice Providers (APPs -  Physician Assistants and Nurse Practitioners) who all work together to provide you with the care you need, when you need it. You will need a follow up appointment in 3 weeks. You may see Ida Rogue, MD or Murray Hodgkins, NP.

## 2019-09-30 NOTE — Progress Notes (Signed)
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  Past Medical History    Past Medical History:  Diagnosis Date   Anxiety    Aortic stenosis    a. mild on echo 04/2014   AV block, Mobitz 2    a. 03/2018 s/p SJM Jennette Banker MP RF model CD [id number] BiV PPM (ser #: [id number]).   Choledocholithiasis    Chronic systolic CHF (congestive heart failure) ([place])    a. echo 04/2014: EF 45-50%, nl LV dia fxn, mild AS, LA mildly dilated, PASP nl; b. 07/2019 Echo: EF 35-40%, diff HK, Nl RV fxn, RVSP 53.11mmHg. Sev dil LA. Mod dil RA. Mild to mod MR. Mod TR. Mod AS. Ao root 37cm.   CKD (chronic kidney disease), stage IV (HCC)    COPD (chronic obstructive pulmonary disease) (HCC)    Not on home oxygen   Coronary artery disease    a. status post CABG in 2007; b. nuc stress test 2014: small region of mild ischemia in the inferior territory, EF 61%, no ischemia, scan similar to prior scan in 2009; c. 07/2016 MV: No ischemia.   Exogenous obesity    GERD (gastroesophageal reflux disease)    Gout    Hiatal hernia    Hip pain, chronic 08/03/2016   Waiting to have hip replacement pending NM stress test results per patient.   History of colonic diverticulitis    History of renal cell carcinoma    a. s/p laser ablation    Hyperlipidemia    Hypertension    Moderate aortic stenosis     a. 07/2019 Echo: Mod AS.   Near syncope    a. 3 separate events in 2017   Nephrolithiasis    Panic attacks    Permanent atrial fibrillation ([place]) 06/06/2016   a. Dx 06/06/2016; b. CHADS2VASc => 5 (CHF, HTN, age x 1, DM, vascular disease)-->was on Eliquis but d/c'd 2/2 anemia and hemoptysis.   Spinal stenosis    Temporomandibular joint pain dysfunction syndrome    Umbilical hernia    Vitamin D deficiency    Wegener's granulomatosis with renal involvement ([place])    Past Surgical History:  Procedure Laterality Date   CARDIAC CATHETERIZATION     CHOLECYSTECTOMY     COLONOSCOPY N/A 06/03/2015   Procedure: COLONOSCOPY;  Surgeon: Lucilla Lame, MD;  Location: [place];  Service: Gastroenterology;  Laterality: N/A;   CORONARY ARTERY BYPASS GRAFT  2004   CABG x 3   EYE SURGERY     HERNIA REPAIR     x 2   HIP SURGERY     right hip replacement   INTRAOCULAR LENS INSERTION     KIDNEY SURGERY     cancer   NOSE SURGERY     PACEMAKER IMPLANT N/A 04/05/2018   SJM Biventricular pacemaker implanted by Dr Rayann Heman for second degree AV block, EF < 50% and permanent afib   POLYPECTOMY  06/03/2015  Procedure: POLYPECTOMY;  Surgeon: Lucilla Lame, MD;  Location: [place];  Service: Gastroenterology;;   PROSTATE SURGERY     TOTAL HIP ARTHROPLASTY Left 12/19/2016   Procedure: LEFT TOTAL HIP ARTHROPLASTY;  Surgeon: Garald Balding, MD;  Location: [place];  Service: Orthopedics;  Laterality: Left;    Allergies  Allergies  Allergen Reactions   Clonazepam Other (See Comments)    Patient is confused for up to 12+ hours after administration. Patient is confused for up to 12+ hours after administration.    History of Present Illness    73 year-old male who has a history of permanent atrial fibrillation, CAD status post CABG in 2007, chronic combined systolic diastolic congestive heart failure (EF 35-40% 08/20), symptomatic bradycardia status post permanent  pacemaker May 2019, moderate aortic stenosis, hyperlipidemia, hypertension, CKD 4, macrocytic anemia, COPD, and Wegener's granulomatosis.  He last underwent stress testing in 2017, which was nonischemic.  Despite his history of permanent atrial fibrillation, he has been off of Eliquis for several months in the setting of macrocytic anemia and hemoptysis.  In July of this year, he was seen in clinic with complaints of swelling and dyspnea.  At that time, he was noncompliant with multiple medications including prednisone, dapsone, CellCept, and Lasix.  He was volume overloaded that day and Lasix 20 mg daily was resumed.  This was subsequently titrated to 20 mg twice daily in September at the discretion of his nephrologist in the setting of weight gain and lower extremity swelling.  He had lab work on October 1, this showed relatively stable renal function with a creatinine of 2.38.  He says that over the past 3-6 months, he has had a general decline in exercise/activity tolerance.  This has been associated with dyspnea on exertion and also exertional substernal chest pressure occurring a few times per week, lasting a few minutes, and resolving with rest.  In this setting, he does not do very much.  Echocardiogram in August showed an EF of 35 to 40% with diffuse hypokinesis.  He is also noted increasing lower extremity swelling which has been less likely to stay off despite being compliant with his medications.  He is bothered by taking Lasix as he requires straight catheterization but says he takes it.  Since February, his weight is up 20 pounds.  He notes that after his nephrologist increased his Lasix to 20 mg twice daily a few weeks ago, that he has had some improvement in lower extremity swelling and scrotal swelling however, is not yet back to baseline.  He has been sleeping in his recliner for the past 2 months or so due to orthopnea.  He denies palpitations, PND, dizziness, syncope, or early satiety.  Recent  intracardiac monitoring from October 5 showed a normal impedance at the time though it was noted that he had reduced impedance between September 8 and 13, suggesting volume excess.  He has evidence of volume overload on exam today.  Home Medications    Prior to Admission medications   Medication Sig Start Date End Date Taking? Authorizing Provider  acetaminophen (TYLENOL) 325 MG tablet Take 1 tablet by mouth as needed for pain.    [provider]  Cholecalciferol (VITAMIN D) 2000 units CAPS Take 2,000 Units by mouth at bedtime.     [provider]  clonazePAM (KLONOPIN) 1 MG tablet Take 1 tablet by mouth as needed for sleep.    [provider]  furosemide (LASIX) 20 MG tablet Take 1 tablet (  20 mg total) by mouth daily. 07/10/19 10/08/19  Minna Merritts, MD  losartan (COZAAR) 25 MG tablet Take 1 tablet (25 mg total) by mouth daily. 07/10/19 10/08/19  Minna Merritts, MD  mycophenolate (CELLCEPT) 250 MG capsule Take 2 capsules (500 mg) by mouth twice daily    [provider]  pantoprazole (PROTONIX) 40 MG tablet Take 1 tablet by mouth daily. 09/17/18   [provider]  predniSONE (DELTASONE) 10 MG tablet Take 0.5 tablets by mouth daily. 07/17/19   [provider]    Review of Systems    Ongoing chest pain, dyspnea, orthopnea, and lower extremity swelling as outlined above.  He denies palpitations, PND, dizziness, syncope, or early satiety.  All other systems reviewed and are otherwise negative except as noted above.  Physical Exam    VS:  BP 120/76 (BP Location: Right Arm, Patient Position: Sitting, Cuff Size: Normal)    Pulse 68    Temp 98.8 F (37.1 C)    Ht 5\' 8"  (1.727 m)    Wt 236 lb 12 oz (107.4 kg)    SpO2 93%    BMI 36.00 kg/m  , BMI Body mass index is 36 kg/m. GEN: Well nourished, well developed, in no acute distress. HEENT: normal. Neck: Supple, difficult to gauge JVP.  No carotid bruits, or masses. Cardiac: Irregularly  irregular with a 3/6 systolic ejection murmur heard throughout.  No, rubs, or gallops. No clubbing, and cyanosis.  Radials/dorsalis pedis 2+ and equal bilaterally.  2+ bilateral lower extremity edema to the knees.  No significant thigh or flank edema. Respiratory:  Respirations regular and slight labored, clear to auscultation bilaterally. GI: Semifirm and protuberant.  Nontender.  BS + x 4. MS: no deformity or atrophy. Skin: warm and dry, no rash. Neuro:  Strength and sensation are intact. Psych: Normal affect.  Accessory Clinical Findings    ECG personally reviewed by me today -ventricular pacing with underlying A. fib, PVCs-no acute ST or T changes  Assessment & Plan    1.  Acute on chronic combined systolic and diastolic congestive heart failure: Patient with a 3 to 41-month history of progressive dyspnea on exertion and intermittent exertional chest discomfort who has been bothered by increasing lower extremity over the past month or more.  Interestingly, recent intracardiac monitoring showed normal impedance on October 5 though clinically, he is volume overloaded.  Echo in August showed slight reduction in EF, down to 35-40%.  His Lasix was recently increased from 20 mg daily to 20 mg twice daily and with this, he has had some improvement in lower extremity swelling and scrotal edema.  His weight is down 4 pounds since then but still up 4 pounds since August (up 20 lbs since Feb).  I am concerned that his drop in LV function, in combination with an uptick in exertional chest discomfort, may be secondary to ischemia and possibly graft failure in the setting of 75 year old grafts.  I have asked him to increase Lasix to 40 mg twice daily for the next 3 days and then drop back down to 20 mg twice daily.  I am going to arrange for a Lexiscan Myoview to rule out ischemia and risk stratify.  Certainly in the setting of stage IV chronic kidney disease, he is a poor candidate for repeat catheterization.   Follow-up basic metabolic panel in 1 week and plan to see him back in 2 weeks.  Continue ARB therapy.  2.  Unstable angina/coronary Artery Disease: As  above, he has noticed exertional chest discomfort over the past 3 months or more.  This is associated with dyspnea.  Plan for [facility].   3.  Permanent Atrial Fibrillation: V paced with underlying A. fib on EKG today.  He is not on any AV nodal blocking agents.  Eliquis on hold in the setting of ongoing anemia.  As outlined previously, would consider resumption of Eliquis if hemoglobin greater than 10 however, he was 9.6 on September 17.  4.  Essential hypertension: Stable on ARB therapy.  5.  Moderate aortic stenosis: Aortic stenosis was moderate by echo in August.  Question role in current symptoms.    6.  Macrocytic anemia: In the setting of prior hemoptysis and chronic kidney disease.  Hemoglobin 9.6 in September.  Certainly anemia may be playing a role in his dyspnea, chest pain, and volume excess.    7.  Stage IV chronic kidney disease: Follow-up basic metabolic panel in 1 week.    8.  Disposition: Follow-up basic metabolic panel in 1 week.  Follow-up Lexiscan Myoview.  Follow-up in clinic in 2 to 3 weeks.   Murray Hodgkins, NP 09/30/2019, 5:58 PM

## 2019-10-06 ENCOUNTER — Other Ambulatory Visit: Payer: Self-pay

## 2019-10-06 ENCOUNTER — Encounter
Admission: RE | Admit: 2019-10-06 | Discharge: 2019-10-06 | Disposition: A | Discharge status: Home / Self Care | Payer: Medicare Other | Source: Ambulatory Visit | Attending: Nurse Practitioner | Admitting: Nurse Practitioner

## 2019-10-06 DIAGNOSIS — I200 Unstable angina: Secondary | ICD-10-CM

## 2019-10-06 LAB — NM MYOCAR MULTI W/SPECT W/WALL MOTION / EF
Estimated workload: 1 METS
Exercise duration (min): 0 min
Exercise duration (sec): 0 s
LV dias vol: 181 mL (ref 62–150)
LV sys vol: 105 mL
MPHR: 55 {beats}/min
Peak HR: 80 {beats}/min
Percent HR: 55 %
Rest HR: 69 {beats}/min
SDS: 2
SRS: 6
SSS: 3
TID: 0.92

## 2019-10-06 MED ORDER — TECHNETIUM TC 99M TETROFOSMIN IV KIT
10.0000 | PACK | Freq: Once | INTRAVENOUS | Status: AC | PRN
  Administered 2019-10-06: 11.3 via INTRAVENOUS

## 2019-10-06 MED ORDER — TECHNETIUM TC 99M TETROFOSMIN IV KIT
31.2700 | PACK | Freq: Once | INTRAVENOUS | Status: AC | PRN
  Administered 2019-10-06: 31.27 via INTRAVENOUS

## 2019-10-06 MED ORDER — REGADENOSON 0.4 MG/5ML IV SOLN
0.4000 mg | Freq: Once | INTRAVENOUS | Status: AC
  Administered 2019-10-06: 0.4 mg via INTRAVENOUS

## 2019-10-07 ENCOUNTER — Telehealth: Payer: Self-pay

## 2019-10-07 NOTE — Telephone Encounter (Signed)
Call to patient to review results of stress test. Spoke to wife Kermit Balo, okay per DPR.   NO new orders at this time. Will follow up as planned.

## 2019-10-07 NOTE — Telephone Encounter (Signed)
-----   Message from Theora Gianotti, NP sent at 10/07/2019 11:57 AM EDT ----- Abnormal study with suggestion of prior heart attack and reduced heart squeezing fxn (known from echo).  Overall, similar to 2017 study.  F/u 11/3 as planned.

## 2019-10-14 NOTE — Progress Notes (Signed)
[name], male   DOB: 07-Dec-1946, 73 y.o.   MRN: [id number] Cardiology Office Note  Date:  10/15/2019   ID:  Jesus Hendrix September 06, 1946, MRN [id number]  PCP:  Maryland Pink, MD   Chief Complaint  Patient presents with  . Other    3 week follow up. Patient c/o chest pain, SOB, and swelling. Meds reviewed verbally with patient.     HPI:  [name] is a 73 year old gentleman with  Persistent atrial fibrillation chronic back pain and sciatica down his left leg,  coronary artery disease and bypass surgery in 2007,  hyperlipidemia,  obesity,  kidney cancer,  status post laser ablation at [facility], ANCA associated vasculitis, Wegener's granulomatosis,  previous history of dizziness which he attributes to his Wegener's followed by [facility] nephrology -Medication noncompliance EF 35%, pulm HTN who presents for routine followup of his coronary artery disease, atrial fibrillation, heart block with pacer, Wegener's, hemoptysis, blood loss  In follow-up today has several issues to discuss Reports that he continues to have leg swelling but there has been some improvement  Unable to urinate well, has to self cath three times a day  Indigestion has been a major issue in the evening, feels like heartburn Reports compliance with his PPI   He continues to have significant SOB at night Worse when he lay supine Continues to take Lasix 20 daily He feels the shortness of breath is from fluid retention Shortness of breath makes him feel very anxious BNP 2 weeks 232 Now sleeping in a recliner  EKG personally reviewed by myself on todays visit Shows paced rhythm rate 60 bpm underlying atrial fibrillation  Other past medical history reviewed heart rate in the 30s, transferred to [facility], Pacerplacedforcomplete heart block switch to eliquis  Chronic severe back pain, walking with a limp Till working, digging, foundations and sewage Followed by Dr. 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 Known atrophic left kidney.  Previous issues with anxiety  cardiac catheterization in January 2007 details 75-80% long lesion in the LAD at the ostium, 95% lesion in the PDA. He was referred to bypass surgery. 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 Defect worse in the inferoapical region. No ischemia. Ejection fraction 55%. Exercised for 7 METS  Previous history of myalgias on high-dose cholesterol medication. Tolerating Lipitor 30 mg daily  PMH:   has a past medical history of Anxiety, Aortic stenosis, AV block, Mobitz 2, Choledocholithiasis, Chronic systolic CHF (congestive heart failure) ([place]), CKD (chronic kidney disease), stage IV ([place]), COPD (chronic obstructive pulmonary disease) ([place]), Coronary artery disease, Exogenous obesity, GERD (gastroesophageal reflux disease), Gout, Hiatal hernia, Hip pain, chronic (08/03/2016), History of colonic diverticulitis, History of renal cell carcinoma, Hyperlipidemia, Hypertension, Moderate aortic stenosis, Near syncope, Nephrolithiasis, Panic attacks, Permanent atrial fibrillation ([place]) (06/06/2016), Spinal stenosis, Temporomandibular joint pain dysfunction syndrome, Umbilical hernia, Vitamin D deficiency, and Wegener's granulomatosis with renal involvement ([place]).  PSH:    Past Surgical History:  Procedure Laterality Date  . CARDIAC CATHETERIZATION    . CHOLECYSTECTOMY    . COLONOSCOPY N/A 06/03/2015   Procedure: COLONOSCOPY;  Surgeon:  Lucilla Lame, MD;  Location: [place];  Service: Gastroenterology;  Laterality: N/A;  . CORONARY ARTERY BYPASS GRAFT  2004   CABG x 3  . EYE SURGERY    . HERNIA REPAIR     x 2  . HIP SURGERY     right hip replacement  . INTRAOCULAR LENS INSERTION    . KIDNEY SURGERY     cancer  . NOSE SURGERY    . PACEMAKER IMPLANT N/A 04/05/2018   SJM Biventricular pacemaker implanted by Dr Rayann Heman for second degree AV block, EF < 50% and permanent afib  . POLYPECTOMY  06/03/2015   Procedure: POLYPECTOMY;  Surgeon: Lucilla Lame, MD;  Location: [place];  Service: Gastroenterology;;  . PROSTATE SURGERY    . TOTAL HIP ARTHROPLASTY Left 12/19/2016   Procedure: LEFT TOTAL HIP ARTHROPLASTY;  Surgeon: Garald Balding, MD;  Location: [place];  Service: Orthopedics;  Laterality: Left;    Current Outpatient Medications  Medication Sig Dispense Refill  . acetaminophen (TYLENOL) 325 MG tablet Take 1 tablet by mouth as needed for pain.    Marland Kitchen ALPRAZolam (XANAX) 0.5 MG tablet Take 0.5 mg by mouth at bedtime.    . Cholecalciferol (VITAMIN D) 2000 units CAPS Take 2,000 Units by mouth at bedtime.     . clonazePAM (KLONOPIN) 1 MG tablet Take 1 tablet by mouth as needed for sleep.    . dapsone 100 MG tablet Take 100 mg by mouth daily.    Marland Kitchen FLUoxetine (PROZAC) 10 MG capsule Take 10 mg by mouth daily.    . furosemide (LASIX) 20 MG tablet Take 1 tablet (20 mg total) by mouth 2 (two) times daily. 180 tablet 3  . mycophenolate (CELLCEPT) 250 MG capsule Take 2 capsules (500 mg) by mouth twice daily    . pantoprazole (PROTONIX) 40 MG tablet Take 1 tablet by mouth daily.    . predniSONE (DELTASONE) 10 MG tablet Take 0.5 tablets by mouth daily.    . tamsulosin (FLOMAX) 0.4 MG CAPS capsule Take 0.4 mg by mouth daily.    Marland Kitchen losartan (COZAAR) 25 MG tablet Take 1 tablet (25 mg total) by mouth daily. 90 tablet 3   No current facility-administered medications for this visit.      Allergies:   Clonazepam    Social History:  The patient  reports that he quit smoking about 44 years ago. His smoking use included cigarettes. He has a 12.50 pack-year smoking history. He has never used smokeless tobacco. He reports that he does not drink alcohol or use drugs.   Family History:   family history includes Heart disease in his brother, mother, and sister.    Review of Systems: Review of Systems  Constitutional: Negative.   Respiratory: Positive for shortness of breath.   Cardiovascular: Negative.   Gastrointestinal: Negative.   Musculoskeletal: Positive for back pain and joint pain.  Neurological: Negative.   All other systems reviewed and are negative.    PHYSICAL EXAM: VS:  BP 132/64 (BP Location: Right Arm, Patient Position: Sitting, Cuff Size: Normal)   Pulse 60   Ht 5\' 7"  (1.702 m)   Wt 234 lb (106.1 kg)   BMI 36.65 kg/m  , BMI Body mass index is 36.65 kg/m. No significant change in exam Constitutional:  oriented to person, place, and time. No distress.  HENT:  Head: Normocephalic and atraumatic.  Eyes:  no discharge. No scleral icterus.  Neck: Normal range of motion. Neck supple. No JVD  present.  Cardiovascular: irregularly irregular, normal heart sounds and intact distal pulses. Exam reveals no gallop and no friction rub.  1-2+ pitting lower extremity edema to below the knees No murmur heard. Pulmonary/Chest: Effort normal and breath sounds normal. No stridor. No respiratory distress.  no wheezes.  no rales.  no tenderness.  Abdominal: Soft.  no distension.  no tenderness.  Musculoskeletal: Normal range of motion.  no  tenderness or deformity.  Neurological:  normal muscle tone. Coordination normal. No atrophy Skin: Skin is warm and dry. No rash noted. not diaphoretic.  Psychiatric:  normal mood and affect. behavior is normal. Thought content normal.    Recent Labs: 07/10/2019: ALT 33 08/28/2019: BUN 48; Creatinine, Ser 2.54; Hemoglobin 8.8; Platelets 250; Potassium 4.3; Sodium  139    Lipid Panel Lab Results  Component Value Date   CHOL 132 04/05/2018   HDL 29 (L) 04/05/2018   LDLCALC 91 04/05/2018   TRIG 58 04/05/2018      Wt Readings from Last 3 Encounters:  10/15/19 234 lb (106.1 kg)  09/30/19 236 lb 12 oz (107.4 kg)  08/18/19 232 lb (105.2 kg)      ASSESSMENT AND PLAN:  Complete heart block Followed by EP Paced rhythm  Wegener's/hemoptysis Close follow-up with [facility] Reports compliance with his medications on dapsone, CellCept prednisone  Anemia Hematocrit 27.8 August 28, 2019  Coronary artery disease involving native coronary artery of native heart with stable angina pectoris -  Currently with no symptoms of angina. No further workup at this time. Continue current medication regimen.  Shortness of breath Suggest Lasix 40 twice daily for 3 days then down to 40 daily Moderate salt and fluid intake Ejection fraction 35%, pulmonary hypertension on prior echo  Persistent atrial fibrillation (HCC) - Eliquis on hold for now in the setting of recent hemoptysis, blood loss, persistent anemia We will wait for hemoglobin of 10 at least before restarting Eliquis Discussed with him, he is at risk of further hemoptysis if he does not take his medications  Essential hypertension Blood pressure is well controlled on today's visit. No changes made to the medications.  Hyperlipidemia Statin was held previously, not from side effects Some medication noncompliance, will follow for now and discussed with him in follow-up  S/P CABG x 3 Previous Stress test as above, no ischemia No further workup  Chronic low back pain Seen by Dr. Glennie Isle    Total encounter time more than 25 minutes  Greater than 50% was spent in counseling and coordination of care with the patient   Orders Placed This Encounter  Procedures  . EKG 12-Lead     Signed, Esmond Plants, M.D., Ph.D. 10/15/2019  [place],  [place]

## 2019-10-15 ENCOUNTER — Other Ambulatory Visit: Payer: Self-pay

## 2019-10-15 ENCOUNTER — Encounter: Payer: Self-pay | Admitting: Cardiovascular Disease

## 2019-10-15 ENCOUNTER — Ambulatory Visit (INDEPENDENT_AMBULATORY_CARE_PROVIDER_SITE_OTHER): Payer: Medicare Other | Admitting: Cardiovascular Disease

## 2019-10-15 VITALS — BP 132/64 | HR 60 | Ht 67.0 in | Wt 234.0 lb

## 2019-10-15 DIAGNOSIS — I4821 Permanent atrial fibrillation: Secondary | ICD-10-CM

## 2019-10-15 DIAGNOSIS — I350 Nonrheumatic aortic (valve) stenosis: Secondary | ICD-10-CM

## 2019-10-15 DIAGNOSIS — Z950 Presence of cardiac pacemaker: Secondary | ICD-10-CM

## 2019-10-15 DIAGNOSIS — I5032 Chronic diastolic (congestive) heart failure: Secondary | ICD-10-CM

## 2019-10-15 DIAGNOSIS — I5033 Acute on chronic diastolic (congestive) heart failure: Secondary | ICD-10-CM

## 2019-10-15 DIAGNOSIS — I10 Essential (primary) hypertension: Secondary | ICD-10-CM

## 2019-10-15 DIAGNOSIS — N184 Chronic kidney disease, stage 4 (severe): Secondary | ICD-10-CM | POA: Diagnosis not present

## 2019-10-15 DIAGNOSIS — I25118 Atherosclerotic heart disease of native coronary artery with other forms of angina pectoris: Secondary | ICD-10-CM | POA: Diagnosis not present

## 2019-10-15 MED ORDER — CETIRIZINE HCL 10 MG PO TABS: 10.0000 mg | ORAL_TABLET | Freq: Every day | ORAL | 3 refills | Status: DC

## 2019-10-15 MED ORDER — FUROSEMIDE 40 MG PO TABS: 40.0000 mg | ORAL_TABLET | Freq: Every day | ORAL | 3 refills | Status: DC

## 2019-10-15 MED ORDER — FAMOTIDINE 20 MG PO TABS: ORAL_TABLET | ORAL | 0 refills | Status: DC

## 2019-10-15 NOTE — Patient Instructions (Addendum)
Medication Instructions:  - Your physician has recommended you make the following change in your medication:   1) Please take protonix 40 mg- 1 tablet daily before supper  2) Take pepcid 20 mg- 1 tablet daily before bed as needed for heart burn  3) Start Cetirazine 10 mg- 1 tablet daily for allergy, sinus  4) Increase the lasix up to 40 mg: - take 1 tablet (40 mg) twice a day for three days, then - take 1 tablet (40 mg) once daily    If you need a refill on your cardiac medications before your next appointment, please call your pharmacy.    Lab work: No new labs needed   If you have labs (blood work) drawn today and your tests are completely normal, you will receive your results only by: Marland Kitchen MyChart Message (if you have MyChart) OR . A paper copy in the mail If you have any lab test that is abnormal or we need to change your treatment, we will call you to review the results.   Testing/Procedures: No new testing needed   Follow-Up: At [facility], you and your health needs are our priority.  As part of our continuing mission to provide you with exceptional heart care, we have created designated Provider Care Teams.  These Care Teams include your primary Cardiologist (physician) and Advanced Practice Providers (APPs -  Physician Assistants and Nurse Practitioners) who all work together to provide you with the care you need, when you need it.  . You will need a follow up appointment in 3 months .   Please call our office 2 months in advance to schedule this appointment.    . Providers on your designated Care Team:   . Murray Hodgkins, NP . Christell Faith, PA-C . Marrianne Mood, PA-C  Any Other Special Instructions Will Be Listed Below (If Applicable).  For educational health videos Log in to : [email or web address] Or : [email or web address], password : [email or web address]

## 2019-10-21 ENCOUNTER — Ambulatory Visit: Payer: Medicare Other | Admitting: Physician Assistant

## 2019-10-27 ENCOUNTER — Ambulatory Visit (INDEPENDENT_AMBULATORY_CARE_PROVIDER_SITE_OTHER): Payer: Medicare Other

## 2019-10-27 DIAGNOSIS — Z950 Presence of cardiac pacemaker: Secondary | ICD-10-CM

## 2019-10-27 DIAGNOSIS — I5032 Chronic diastolic (congestive) heart failure: Secondary | ICD-10-CM

## 2019-10-29 ENCOUNTER — Telehealth: Payer: Self-pay

## 2019-10-29 NOTE — Progress Notes (Signed)
EPIC Encounter for ICM Monitoring  Patient Name: Jesus Hendrix is a 73 y.o. male Date: 10/29/2019 Primary Care Physican: Maryland Pink, MD Primary Cardiologist:Gollan Electrophysiologist:Allred 10/15/2019 Weight:234 lbs (office weight)   Attempted call to patient/wifeand unable to reach. Left detailed message per DPR regarding transmission. Transmission reviewed.   CorVueThoracic impedancenormal.     Prescribed:Furosemide40 mg take 1 tablet by mouthdaily.  Labs: 08/28/2019 Creatinine 2.54, BUN 48, Potassium 4.3, Sodium 139, GFR 24-28 07/10/2019 Creatinine 2.31, BUN 41, Potassium 4.4, Sodium 140, GFR 27-31  A complete set of results can be found in Results Review  Recommendations: Left voice mail with ICM number and encouraged to call if experiencing any fluid symptoms.  Follow-up plan: ICM clinic phone appointment on 12/01/2019.   91 day device clinic remote transmission 11/18/2019.  Office appt 01/13/2020 with Dr. Rockey Situ.    Copy of ICM check sent to Dr. Caryl Comes.   3 month ICM trend: 10/27/2019    1 Year ICM trend:       [name], RN 10/29/2019 10:36 AM

## 2019-10-29 NOTE — Telephone Encounter (Signed)
Remote ICM transmission received.  Attempted call to wife/patient regarding ICM remote transmission and left detailed message per DPR.  Advised to return call for any fluid symptoms or questions.     

## 2019-11-18 ENCOUNTER — Ambulatory Visit (INDEPENDENT_AMBULATORY_CARE_PROVIDER_SITE_OTHER): Payer: Medicare Other | Admitting: *Deleted

## 2019-11-18 DIAGNOSIS — I442 Atrioventricular block, complete: Secondary | ICD-10-CM

## 2019-11-18 LAB — CUP PACEART REMOTE DEVICE CHECK
Battery Remaining Longevity: 104 mo
Battery Remaining Percentage: 95.5 %
Battery Voltage: 3.01 V
Date Time Interrogation Session: 20201201020012
Implantable Lead Implant Date: 20190419
Implantable Lead Implant Date: 20190419
Implantable Lead Location: 753858
Implantable Lead Location: 753860
Implantable Pulse Generator Implant Date: 20190419
Lead Channel Impedance Value: 430 Ohm
Lead Channel Impedance Value: 630 Ohm
Lead Channel Pacing Threshold Amplitude: 0.75 V
Lead Channel Pacing Threshold Amplitude: 1 V
Lead Channel Pacing Threshold Pulse Width: 0.5 ms
Lead Channel Pacing Threshold Pulse Width: 0.5 ms
Lead Channel Sensing Intrinsic Amplitude: 12 mV
Lead Channel Setting Pacing Amplitude: 2 V
Lead Channel Setting Pacing Amplitude: 2.5 V
Lead Channel Setting Pacing Pulse Width: 0.5 ms
Lead Channel Setting Pacing Pulse Width: 0.5 ms
Lead Channel Setting Sensing Sensitivity: 4 mV
Pulse Gen Model: 3262
Pulse Gen Serial Number: 9017264

## 2019-11-26 NOTE — Unmapped (Addendum)
Pt will CIC 3 x daily indefinitely for diagnosis of urinary retention and incomplete bladder emptying.   Elvina Sidle, LPN    Medical necessity requirements electronically faxed to [facility] [employer]  Elvina Sidle, LPN

## 2019-12-01 ENCOUNTER — Ambulatory Visit (INDEPENDENT_AMBULATORY_CARE_PROVIDER_SITE_OTHER): Payer: Medicare Other

## 2019-12-01 DIAGNOSIS — Z950 Presence of cardiac pacemaker: Secondary | ICD-10-CM

## 2019-12-01 DIAGNOSIS — I5032 Chronic diastolic (congestive) heart failure: Secondary | ICD-10-CM | POA: Diagnosis not present

## 2019-12-03 NOTE — Progress Notes (Signed)
EPIC Encounter for ICM Monitoring  Patient Name: Jesus Hendrix is a 73 y.o. male Date: 12/03/2019 Primary Care Physican: Maryland Pink, MD Primary Cardiologist:Gollan Electrophysiologist:Allred 10/15/2019 Weight:234 lbs (office weight)   Transmission reviewed.   CorVueThoracic impedancenormal.  Prescribed:Furosemide40 mg take 1 tablet by mouthdaily.  Labs: 08/28/2019 Creatinine2.54Neldon Mc, Potassium4.3, [id number], [id number] 07/10/2019 Creatinine2.31, BUN41, Potassium4.4, Sodium140, [id number] A complete set of results can be found in Results Review  Recommendations: None  Follow-up plan: ICM clinic phone appointment on 01/05/2020.   91 day device clinic remote transmission 02/17/2020.  Office appt 01/13/2020 with Dr. Rockey Situ.    Copy of ICM check sent to Dr. Caryl Comes.   3 month ICM trend: 12/01/2019    1 Year ICM trend:       [name], RN 12/03/2019 8:59 AM

## 2019-12-13 NOTE — Progress Notes (Signed)
PPM remote 

## 2019-12-30 NOTE — Unmapped (Signed)
Called cell and home number, left message for patient to call to schedule a follow-up with Dr. Jeralyn Ruths.

## 2020-01-05 ENCOUNTER — Ambulatory Visit (INDEPENDENT_AMBULATORY_CARE_PROVIDER_SITE_OTHER): Payer: Medicare Other

## 2020-01-05 DIAGNOSIS — I5032 Chronic diastolic (congestive) heart failure: Secondary | ICD-10-CM

## 2020-01-05 DIAGNOSIS — Z950 Presence of cardiac pacemaker: Secondary | ICD-10-CM

## 2020-01-08 ENCOUNTER — Telehealth: Payer: Self-pay | Admitting: Cardiovascular Disease

## 2020-01-08 DIAGNOSIS — I4821 Permanent atrial fibrillation: Secondary | ICD-10-CM

## 2020-01-08 NOTE — Telephone Encounter (Signed)
We need new lab work to help make a decision such as a CBC and BMP Perhaps this can be done before his visit Eliquis was held for anemia and hemoptysis If numbers look stable could potentially restart anticoagulation as long as he does not develop recurrent hemoptysis

## 2020-01-08 NOTE — Telephone Encounter (Signed)
Patient returning call.

## 2020-01-08 NOTE — Telephone Encounter (Signed)
Pt c/o medication issue:  1. Name of Medication: Eliquis   2. How are you currently taking this medication (dosage and times per day)? Holding since last admission to [facility] for bleeding in lungs   3. Are you having a reaction (difficulty breathing--STAT)?   4. What is your medication issue? Wife wants Dr. Rockey Situ to be aware for visit 1/26

## 2020-01-08 NOTE — Telephone Encounter (Signed)
Left voicemail message to call back  

## 2020-01-08 NOTE — Telephone Encounter (Signed)
Spoke with patients wife per release form and patient is coming to see Dr. Rockey Situ next week. He has been off his Eliquis over 6 months or more. She just wanted Dr. Rockey Situ to be aware for his visit. Will update him and they can review at appointment.

## 2020-01-09 ENCOUNTER — Other Ambulatory Visit
Admission: RE | Admit: 2020-01-09 | Discharge: 2020-01-09 | Disposition: A | Discharge status: Home / Self Care | Payer: Medicare Other | Source: Ambulatory Visit | Attending: Cardiovascular Disease | Admitting: Cardiovascular Disease

## 2020-01-09 DIAGNOSIS — Z950 Presence of cardiac pacemaker: Secondary | ICD-10-CM

## 2020-01-09 DIAGNOSIS — I4821 Permanent atrial fibrillation: Secondary | ICD-10-CM | POA: Insufficient documentation

## 2020-01-09 DIAGNOSIS — I5032 Chronic diastolic (congestive) heart failure: Secondary | ICD-10-CM

## 2020-01-09 LAB — CBC
HCT: 32.9 % — ABNORMAL LOW (ref 39.0–52.0)
Hemoglobin: 11 g/dL — ABNORMAL LOW (ref 13.0–17.0)
MCH: 32.5 pg (ref 26.0–34.0)
MCHC: 33.4 g/dL (ref 30.0–36.0)
MCV: 97.3 fL (ref 80.0–100.0)
Platelets: 276 10*3/uL (ref 150–400)
RBC: 3.38 MIL/uL — ABNORMAL LOW (ref 4.22–5.81)
RDW: 14.2 % (ref 11.5–15.5)
WBC: 6.4 10*3/uL (ref 4.0–10.5)
nRBC: 0 % (ref 0.0–0.2)

## 2020-01-09 LAB — BASIC METABOLIC PANEL
Anion gap: 10 (ref 5–15)
BUN: 48 mg/dL — ABNORMAL HIGH (ref 8–23)
CO2: 22 mmol/L (ref 22–32)
Calcium: 8.9 mg/dL (ref 8.9–10.3)
Chloride: 105 mmol/L (ref 98–111)
Creatinine, Ser: 2.61 mg/dL — ABNORMAL HIGH (ref 0.61–1.24)
GFR calc Af Amer: 27 mL/min — ABNORMAL LOW (ref >60)
GFR calc non Af Amer: 23 mL/min — ABNORMAL LOW (ref >60)
Glucose, Bld: 116 mg/dL — ABNORMAL HIGH (ref 70–99)
Potassium: 4.5 mmol/L (ref 3.5–5.1)
Sodium: 137 mmol/L (ref 135–145)

## 2020-01-09 NOTE — Progress Notes (Signed)
EPIC Encounter for ICM Monitoring  Patient Name: Jesus Hendrix is a 74 y.o. male Date: 01/09/2020 Primary Care Physican: Maryland Pink, MD Primary Cardiologist:Gollan Electrophysiologist:Allred 10/28/2020Weight:234 lbs (office weight)   Attempted call to patient and unable to reach.  Left detailed message per DPR regarding transmission. Transmission reviewed.   CorVueThoracic impedancenormal but suggestive of possible fluid accumulation from1/11/21 - 01/04/20.  Prescribed:Furosemide40 mg take 1 tablet by mouthdaily.  Labs: 08/28/2019 Creatinine2.54, BUN48, Potassium4.3, [id number], [id number] 07/10/2019 Creatinine2.31, BUN41, Potassium4.4, Sodium140, [id number] A complete set of results can be found in Results Review  Recommendations: Left voice mail with ICM number and encouraged to call if experiencing any fluid symptoms.  Follow-up plan: ICM clinic phone appointment on2/22/2021. 91 day device clinic remote transmission 02/17/2020. Office appt 1/26/2021with Dr. Rockey Situ.   Copy of ICM check sent to [place].   3 month ICM trend: 01/05/2020    1 Year ICM trend:       [name], RN 01/09/2020 10:22 AM

## 2020-01-09 NOTE — Telephone Encounter (Signed)
Spoke with patients wife per release form and requested that he go to [facility] Entrance to have labs done today for his upcoming appointment. She verbalized understanding and will get in touch with him to go have those done. She was appreciative for the call back with no further questions at this time.

## 2020-01-11 NOTE — Progress Notes (Signed)
Patient ID: DETRAVION TESTER, male   DOB: 23-Nov-1946, 74 y.o.   MRN: [id number] Cardiology Office Note  Date:  01/13/2020   ID:  Jesus Hendrix, Jesus Hendrix April 07, 1977, MRN [id number]  PCP:  Jesus Pink, MD   Chief Complaint  Patient presents with  . Other    3 month follow up. Patient c/o chest pain and SOB. Meds reviewed verbally with patient.     HPI:  Jesus Hendrix is a 74 year old gentleman with  Persistent atrial fibrillation chronic back pain and sciatica down his left leg,  coronary artery disease and bypass surgery in 2007,  hyperlipidemia,  obesity,  kidney cancer,  status post laser ablation at [facility], ANCA associated vasculitis, Wegener's granulomatosis,  previous history of dizziness which he attributes to his Wegener's followed by [facility] nephrology -Medication noncompliance EF 35%, pulm HTN who presents for routine followup of his coronary artery disease, atrial fibrillation, heart block with pacer, Wegener's, hemoptysis, blood loss  Having drainage in head?, seems to be coughing up a lot, clear sputum No hemoptysis Compliant with medications, wife does it for him  Leg swelling resolved on higher dose Lasix 40 daily Polyuria/nocturia bothers him Drinks a lot of fluids after dinner Self caths 3x a day Has difficulty making it until morning, sides start to hurt, some urgency for seeing in the morning  Chronic mild shortness of breath on exertion, deconditioned, no regular walking program  EKG personally reviewed by myself on todays visit Shows paced rhythm rate 60 bpm likely underlying atrial fibrillation  Other past medical history reviewed heart rate in the 30s, transferred to [facility], Pacerplacedforcomplete heart block switch to eliquis  Chronic severe back pain, walking with a limp Till working, digging, foundations and sewage Followed by Jesus Hendrix, Neurosurgery  Followed by nephrology at [facility] 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 Known atrophic left kidney.  Previous issues with anxiety  cardiac catheterization in January 2007 details 75-80% long lesion in the LAD at the ostium, 95% lesion in the PDA. He was referred to bypass surgery. 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 Defect worse in the inferoapical region. No ischemia. Ejection fraction 55%. Exercised for 7 METS  Previous history of myalgias on high-dose cholesterol medication. Tolerating Lipitor 30 mg daily  PMH:   has a past medical history of Anxiety, Aortic stenosis, AV block, Mobitz 2, Choledocholithiasis, Chronic systolic CHF (congestive heart failure) ([place]), CKD (chronic kidney disease), stage IV ([place]), COPD (chronic obstructive pulmonary disease) ([place]), Coronary artery disease, Exogenous obesity, GERD (gastroesophageal reflux disease), Gout, Hiatal hernia, Hip pain, chronic (08/03/2016), History of colonic diverticulitis, History of renal cell carcinoma, Hyperlipidemia, Hypertension, Moderate aortic stenosis, Near syncope, Nephrolithiasis, Panic attacks, Permanent atrial fibrillation ([place]) (06/06/2016), Spinal stenosis, Temporomandibular joint pain dysfunction syndrome, Umbilical hernia, Vitamin D deficiency, and Wegener's granulomatosis with renal involvement ([place]).  PSH:    Past Surgical History:  Procedure Laterality Date  . CARDIAC CATHETERIZATION    . CHOLECYSTECTOMY    . COLONOSCOPY N/A 06/03/2015   Procedure: COLONOSCOPY;  Surgeon: Jesus Lame, MD;  Location: [place];  Service: Gastroenterology;  Laterality: N/A;  . CORONARY ARTERY BYPASS  GRAFT  2004   CABG x 3  . EYE SURGERY    . HERNIA REPAIR     x 2  . HIP SURGERY     right hip replacement  . INTRAOCULAR LENS INSERTION    . KIDNEY SURGERY     cancer  . NOSE SURGERY    . PACEMAKER IMPLANT N/A 04/05/2018   SJM Biventricular pacemaker implanted by Jesus Hendrix for second degree AV block, EF < 50% and permanent afib  . POLYPECTOMY  06/03/2015   Procedure: POLYPECTOMY;  Surgeon: Jesus Lame, MD;  Location: [place];  Service: Gastroenterology;;  . PROSTATE SURGERY    . TOTAL HIP ARTHROPLASTY Left 12/19/2016   Procedure: LEFT TOTAL HIP ARTHROPLASTY;  Surgeon: Jesus Balding, MD;  Location: [place];  Service: Orthopedics;  Laterality: Left;    Current Outpatient Medications  Medication Sig Dispense Refill  . acetaminophen (TYLENOL) 325 MG tablet Take 1 tablet by mouth as needed for pain.    Marland Kitchen ALPRAZolam (XANAX) 0.5 MG tablet Take 0.5 mg by mouth at bedtime.    . cetirizine (ZYRTEC) 10 MG tablet Take 1 tablet (10 mg total) by mouth daily. 30 tablet 3  . Cholecalciferol (VITAMIN D) 2000 units CAPS Take 2,000 Units by mouth at bedtime.     . clonazePAM (KLONOPIN) 1 MG tablet Take 1 tablet by mouth as needed for sleep.    . famotidine (PEPCID) 20 MG tablet Take 1 tablet (20 mg) by mouth once daily at bedtime as needed for heart burn 90 tablet 0  . FLUoxetine (PROZAC) 10 MG capsule Take 10 mg by mouth daily.    . furosemide (LASIX) 40 MG tablet Take 1 tablet (40 mg total) by mouth daily. 90 tablet 3  . mycophenolate (CELLCEPT) 250 MG capsule Take 2 capsules (500 mg) by mouth twice daily    . pantoprazole (PROTONIX) 40 MG tablet Take 1 tablet (40 mg) by mouth once daily before supper    . predniSONE (DELTASONE) 10 MG tablet Take 0.5 tablets by mouth daily.    . tamsulosin (FLOMAX) 0.4 MG CAPS capsule Take 0.4 mg by mouth daily.    Marland Kitchen losartan (COZAAR) 25 MG tablet Take 1 tablet (25 mg total) by mouth daily. 90 tablet 3   No current facility-administered medications for  this visit.     Allergies:   Clonazepam   Social History:  The patient  reports that he quit smoking about 45 years ago. His smoking use included cigarettes. He has a 12.50 pack-year smoking history. He has never used smokeless tobacco. He reports that he does not drink alcohol or use drugs.   Family History:   family history includes Heart disease in his brother, mother, and sister.    Review of Systems: Review of Systems  Constitutional: Negative.   HENT: Negative.   Respiratory: Positive for shortness of breath.   Cardiovascular: Negative.   Gastrointestinal: Negative.   Musculoskeletal: Positive for back pain.  Neurological: Negative.   All other systems reviewed and are negative.   PHYSICAL EXAM: VS:  BP 124/60 (BP Location: Left Arm, Patient Position: Sitting, Cuff Size: Normal)   Pulse 60   Ht 5\' 8"  (1.727 m)   Wt 225 lb (102.1 kg)   BMI 34.21 kg/m  , BMI Body mass index is 34.21 kg/m. Constitutional:  oriented to person, place, and time. No distress.  Presents in a wheelchair HENT:  Head: Grossly normal Eyes:  no discharge. No scleral icterus.  Neck: No JVD,  no carotid bruits  Cardiovascular: Regular rate and rhythm, no murmurs appreciated Pulmonary/Chest: Clear to auscultation bilaterally, no wheezes or rails Abdominal: Soft.  no distension.  no tenderness.  Musculoskeletal: Normal range of motion Neurological:  normal muscle tone. Coordination normal. No atrophy Skin: Skin warm and dry Psychiatric: normal affect, pleasant  Recent Labs: 07/10/2019: ALT 33 01/09/2020: BUN 48; Creatinine, Ser 2.61; Hemoglobin 11.0; Platelets 276; Potassium 4.5; Sodium 137    Lipid Panel Lab Results  Component Value Date   CHOL 132 04/05/2018   HDL 29 (L) 04/05/2018   LDLCALC 91 04/05/2018   TRIG 58 04/05/2018      Wt Readings from Last 3 Encounters:  01/13/20 225 lb (102.1 kg)  10/15/19 234 lb (106.1 kg)  09/30/19 236 lb 12 oz (107.4 kg)      ASSESSMENT AND  PLAN:  Complete heart block Followed by EP Paced rhythm  Wegener's/hemoptysis Close follow-up with [facility] Compliance with his medications No hemoptysis Long discussion with him concerning restarting anticoagulation As detailed below  compliance with his medications on dapsone, CellCept prednisone  Anemia Stable numbers off Eliquis Order for CBC placed in several weeks time after restarting Eliquis today  Coronary artery disease involving native coronary artery of native heart with stable angina pectoris -  Currently with no symptoms of angina. No further workup at this time. Continue current medication regimen.  Shortness of breath Improved symptoms with Lasix 40 daily, creatinine slightly above baseline Recommend he stay on Lasix 40 daily hold dose on Sunday  Persistent atrial fibrillation (HCC) - Eliquis previously on hold for hemoptysis secondary to Wegener's Denies any recent symptoms, compliant with his medications Recommend he restart Eliquis 5 twice daily CBC several weeks time Suggest he call us for any hemoptysis Risk and benefit of anticoagulation discussed with him  Essential hypertension Blood pressure well controlled, no changes made  Hyperlipidemia Statin was held previously, not from side effects Some medication noncompliance, will follow for now and discussed with him in follow-up  S/P CABG x 3 Denies anginal symptoms, prior stress test no ischemia No further workup  Chronic low back pain Seen by Jesus. [place]   Total encounter time more than 25 minutes  Greater than 50% was spent in counseling and coordination of care with the patient   Orders Placed This Encounter  Procedures  . EKG 12-Lead     Signed, Esmond Plants, M.D., Ph.D. 01/13/2020  [place], [place]

## 2020-01-13 ENCOUNTER — Ambulatory Visit (INDEPENDENT_AMBULATORY_CARE_PROVIDER_SITE_OTHER): Payer: Medicare Other | Admitting: Cardiovascular Disease

## 2020-01-13 ENCOUNTER — Other Ambulatory Visit: Payer: Self-pay

## 2020-01-13 ENCOUNTER — Encounter: Payer: Self-pay | Admitting: Cardiovascular Disease

## 2020-01-13 VITALS — BP 124/60 | HR 60 | Ht 68.0 in | Wt 225.0 lb

## 2020-01-13 DIAGNOSIS — I442 Atrioventricular block, complete: Secondary | ICD-10-CM | POA: Diagnosis not present

## 2020-01-13 DIAGNOSIS — I200 Unstable angina: Secondary | ICD-10-CM

## 2020-01-13 DIAGNOSIS — I4821 Permanent atrial fibrillation: Secondary | ICD-10-CM | POA: Diagnosis not present

## 2020-01-13 DIAGNOSIS — I25118 Atherosclerotic heart disease of native coronary artery with other forms of angina pectoris: Secondary | ICD-10-CM

## 2020-01-13 DIAGNOSIS — I5032 Chronic diastolic (congestive) heart failure: Secondary | ICD-10-CM

## 2020-01-13 DIAGNOSIS — I10 Essential (primary) hypertension: Secondary | ICD-10-CM

## 2020-01-13 DIAGNOSIS — M3131 Wegener's granulomatosis with renal involvement: Secondary | ICD-10-CM

## 2020-01-13 DIAGNOSIS — Z950 Presence of cardiac pacemaker: Secondary | ICD-10-CM | POA: Diagnosis not present

## 2020-01-13 DIAGNOSIS — N184 Chronic kidney disease, stage 4 (severe): Secondary | ICD-10-CM

## 2020-01-13 DIAGNOSIS — I350 Nonrheumatic aortic (valve) stenosis: Secondary | ICD-10-CM

## 2020-01-13 MED ORDER — APIXABAN 5 MG PO TABS: 5.0000 mg | ORAL_TABLET | Freq: Two times a day (BID) | ORAL | 11 refills | Status: DC

## 2020-01-13 NOTE — Patient Instructions (Addendum)
Medication Instructions:  Your physician has recommended you make the following change in your medication:  1. START Eliquis 5 mg twice a day 2. HOLD Furosemide on Sunday  For mucus,  Try cetirazine one a day, Try sudafed as needed   If you need a refill on your cardiac medications before your next appointment, please call your pharmacy.    Lab work: CBC to be done at [place] sometime around February 15th.   If you have labs (blood work) drawn today and your tests are completely normal, you will receive your results only by: Marland Kitchen MyChart Message (if you have MyChart) OR . A paper copy in the mail If you have any lab test that is abnormal or we need to change your treatment, we will call you to review the results.   Testing/Procedures: No new testing needed   Follow-Up: At [facility], you and your health needs are our priority.  As part of our continuing mission to provide you with exceptional heart care, we have created designated Provider Care Teams.  These Care Teams include your primary Cardiologist (physician) and Advanced Practice Providers (APPs -  Physician Assistants and Nurse Practitioners) who all work together to provide you with the care you need, when you need it.  . You will need a follow up appointment in 6 months   . Providers on your designated Care Team:   . Murray Hodgkins, NP . Christell Faith, PA-C . Marrianne Mood, PA-C  Any Other Special Instructions Will Be Listed Below (If Applicable).  For educational health videos Log in to : [email or web address] Or : [email or web address], password : [email or web address]

## 2020-01-28 ENCOUNTER — Telehealth: Payer: Self-pay | Admitting: Cardiovascular Disease

## 2020-01-28 NOTE — Telephone Encounter (Signed)
He can stop the eliquis Would call when symptoms better Unclear if from eliquis,  Would suggest he also talk with rheumatology, let them know

## 2020-01-28 NOTE — Telephone Encounter (Signed)
I attempted to call the patient. He was not home- I spoke with his wife, Jesus Hendrix (ok per DPR).  Per Jesus Hendrix, the patient is having bilateral bruising to his arms.  He has been unable to lift his right shoulder for ~ a week. He is having significant amount of bruising to the right arm from the elbow to the shoulder- she reports this as purple and red areas.   The left arm has areas of bruising as well and itches as well.   The patient's wife denies any fever/ warmth to the area. She states the patient does not recall hitting his arm on anything.   The patient is currently on eliquis 5 mg BID and has Wegener's disease.  He was recently restarted on eliquis 5 mg BID on 01/13/20 at his office visit with Dr. Rockey Situ.   I have advised with age and the blood thinner, that it may not take much to cause bruising to the skin.  In regards to the itching, the patient's wife denies that this looks like a bug bite.   She does state that she cut the patient's eliquis back to 1 pill for the last 2 days to see if this would help. I advised her that the concern is him missing the 2nd dose of the day that he is not getting a full 24 hour therapeutic coverage from his eliquis.   I have advised I will review the above with Dr. Rockey Situ, but he is currently out of the office today, so it will be tomorrow before we call back. I have asked her to give the patient his 2nd dose of eliquis tonight.   [name] voices understanding and is agreeable.

## 2020-01-28 NOTE — Telephone Encounter (Signed)
Pt c/o medication issue:  1. Name of Medication: Eliquis   2. How are you currently taking this medication (dosage and times per day)? 5 MG   3. Are you having a reaction (difficulty breathing--STAT)? Red arm - itching   4. What is your medication issue? Patient has just started taking Eliquis fairly recently.  Patient's right arm is red and itching badly.  Please call to discuss.

## 2020-01-29 ENCOUNTER — Other Ambulatory Visit: Payer: Self-pay | Admitting: Cardiovascular Disease

## 2020-01-29 NOTE — Telephone Encounter (Signed)
Spoke with patients wife and reviewed providers recommendations. She states that she can send [place] a picture. Reviewed how to send one through My Chart and I would forward to Dr. Rockey Situ for his review. Instructed her to please call us when his symptoms improve. She then hung up the phone.

## 2020-02-09 ENCOUNTER — Ambulatory Visit (INDEPENDENT_AMBULATORY_CARE_PROVIDER_SITE_OTHER): Payer: Medicare Other

## 2020-02-09 DIAGNOSIS — I5032 Chronic diastolic (congestive) heart failure: Secondary | ICD-10-CM | POA: Diagnosis not present

## 2020-02-09 DIAGNOSIS — Z950 Presence of cardiac pacemaker: Secondary | ICD-10-CM

## 2020-02-13 NOTE — Progress Notes (Signed)
EPIC Encounter for ICM Monitoring  Patient Name: Jesus Hendrix is a 74 y.o. male Date: 02/13/2020 Primary Care Physican: Maryland Pink, MD Primary Cardiologist:Gollan Electrophysiologist:Allred 01/13/2020 Office Weight:225 lbs    Transmission reviewed.   CorVueThoracic impedancenormal.  Prescribed:Furosemide40 mg take 1 tablet by mouthdaily.  Labs: 01/09/2020 Creatinine 2.61, BUN 48, Potassium 4.5, Sodium 137, GFR 23-27 08/28/2019 Creatinine2.54, BUN48, Potassium4.3, Sodium139, [id number] 07/10/2019 Creatinine2.31, BUN41, Potassium4.4, Sodium140, [id number] A complete set of results can be found in Results Review  Recommendations: None  Follow-up plan: ICM clinic phone appointment on 03/15/2020. 91 day device clinic remote transmission3/01/2020.   Copy of ICM check sent to [place].  3 month ICM trend: 02/07/2020        [name], RN 02/13/2020 12:46 PM

## 2020-02-17 ENCOUNTER — Ambulatory Visit (INDEPENDENT_AMBULATORY_CARE_PROVIDER_SITE_OTHER): Payer: Medicare Other | Admitting: *Deleted

## 2020-02-17 DIAGNOSIS — I442 Atrioventricular block, complete: Secondary | ICD-10-CM | POA: Diagnosis not present

## 2020-02-17 LAB — CUP PACEART REMOTE DEVICE CHECK
Battery Remaining Longevity: 103 mo
Battery Remaining Percentage: 95.5 %
Battery Voltage: 2.99 V
Date Time Interrogation Session: 20210302025333
Implantable Lead Implant Date: 20190419
Implantable Lead Implant Date: 20190419
Implantable Lead Location: 753858
Implantable Lead Location: 753860
Implantable Pulse Generator Implant Date: 20190419
Lead Channel Impedance Value: 400 Ohm
Lead Channel Impedance Value: 680 Ohm
Lead Channel Pacing Threshold Amplitude: 0.75 V
Lead Channel Pacing Threshold Amplitude: 1 V
Lead Channel Pacing Threshold Pulse Width: 0.5 ms
Lead Channel Pacing Threshold Pulse Width: 0.5 ms
Lead Channel Sensing Intrinsic Amplitude: 12 mV
Lead Channel Setting Pacing Amplitude: 2 V
Lead Channel Setting Pacing Amplitude: 2.5 V
Lead Channel Setting Pacing Pulse Width: 0.5 ms
Lead Channel Setting Pacing Pulse Width: 0.5 ms
Lead Channel Setting Sensing Sensitivity: 4 mV
Pulse Gen Model: 3262
Pulse Gen Serial Number: 9017264

## 2020-02-18 NOTE — Progress Notes (Signed)
PPM Remote  

## 2020-03-15 ENCOUNTER — Ambulatory Visit (INDEPENDENT_AMBULATORY_CARE_PROVIDER_SITE_OTHER): Payer: Medicare Other

## 2020-03-15 DIAGNOSIS — I5032 Chronic diastolic (congestive) heart failure: Secondary | ICD-10-CM | POA: Diagnosis not present

## 2020-03-15 DIAGNOSIS — Z950 Presence of cardiac pacemaker: Secondary | ICD-10-CM

## 2020-03-17 NOTE — Progress Notes (Signed)
EPIC Encounter for ICM Monitoring  Patient Name: Jesus Hendrix is a 74 y.o. male Date: 03/17/2020 Primary Care Physican: Maryland Pink, MD Primary Cardiologist:Gollan Electrophysiologist:Allred 02/03/2020 Office Weight:239 lbs    Transmission reviewed.  CorVueThoracic impedancenormal.  Prescribed:Furosemide40 mg take 1 tablet by mouthdaily.  Labs: 01/09/2020 Creatinine 2.61, BUN 48, Potassium 4.5, Sodium 137, GFR 23-27 08/28/2019 Creatinine2.54, BUN48, Potassium4.3, Sodium139, [id number] 07/10/2019 Creatinine2.31, BUN41, Potassium4.4, Sodium140, [id number] A complete set of results can be found in Results Review  Recommendations: None  Follow-up plan: ICM clinic phone appointment on 04/19/2020. 91 day device clinic remote transmission6/12/2019.   Copy of ICM check sent to Dr.Allred.   3 month ICM trend: 03/15/2020    1 Year ICM trend:       [name], RN 03/17/2020 3:22 PM

## 2020-04-08 ENCOUNTER — Ambulatory Visit: Admit: 2020-04-08 | Discharge: 2020-04-09 | Payer: MEDICARE

## 2020-04-08 DIAGNOSIS — I776 Arteritis, unspecified: Principal | ICD-10-CM

## 2020-04-08 DIAGNOSIS — M359 Systemic involvement of connective tissue, unspecified: Principal | ICD-10-CM

## 2020-04-08 LAB — CBC W/ AUTO DIFF
BASOPHILS ABSOLUTE COUNT: 0.1 10*9/L (ref 0.0–0.1)
BASOPHILS RELATIVE PERCENT: 1.3 %
EOSINOPHILS ABSOLUTE COUNT: 0.5 10*9/L (ref 0.0–0.7)
EOSINOPHILS RELATIVE PERCENT: 7 %
HEMATOCRIT: 34.2 % — ABNORMAL LOW (ref 38.0–50.0)
HEMOGLOBIN: 11.4 g/dL — ABNORMAL LOW (ref 13.5–17.5)
LYMPHOCYTES RELATIVE PERCENT: 20.3 %
MEAN CORPUSCULAR HEMOGLOBIN CONC: 33.4 g/dL (ref 30.0–36.0)
MEAN CORPUSCULAR HEMOGLOBIN: 33.3 pg (ref 26.0–34.0)
MEAN CORPUSCULAR VOLUME: 99.6 fL — ABNORMAL HIGH (ref 81.0–95.0)
MEAN PLATELET VOLUME: 8.2 fL (ref 7.0–10.0)
MONOCYTES ABSOLUTE COUNT: 0.5 10*9/L (ref 0.1–1.0)
NEUTROPHILS ABSOLUTE COUNT: 4.9 10*9/L (ref 1.7–7.7)
NEUTROPHILS RELATIVE PERCENT: 64.7 %
PLATELET COUNT: 299 10*9/L (ref 150–450)
RED CELL DISTRIBUTION WIDTH: 14.6 % (ref 12.0–15.0)
WBC ADJUSTED: 7.6 10*9/L (ref 3.5–10.5)

## 2020-04-08 LAB — RENAL FUNCTION PANEL
ANION GAP: 3 mmol/L (ref 3–11)
BLOOD UREA NITROGEN: 39 mg/dL — ABNORMAL HIGH (ref 9–23)
BUN / CREAT RATIO: 15
CALCIUM: 9.9 mg/dL (ref 8.7–10.4)
CHLORIDE: 109 mmol/L — ABNORMAL HIGH (ref 98–107)
CO2: 28.1 mmol/L (ref 20.0–31.0)
CREATININE: 2.55 mg/dL — ABNORMAL HIGH (ref 0.60–1.10)
EGFR CKD-EPI AA MALE: 27 mL/min/{1.73_m2}
EGFR CKD-EPI NON-AA MALE: 24 mL/min/{1.73_m2}
GLUCOSE RANDOM: 105 mg/dL (ref 70–179)
POTASSIUM: 4.7 mmol/L (ref 3.5–5.1)
SODIUM: 140 mmol/L (ref 135–145)

## 2020-04-08 LAB — BASOPHILS ABSOLUTE COUNT: Basophils:NCnc:Pt:Bld:Qn:Automated count: 0.1

## 2020-04-08 LAB — ANION GAP: Anion gap 3:SCnc:Pt:Ser/Plas:Qn:: 3

## 2020-04-08 NOTE — Unmapped (Signed)
1. Please contact Dr. Celso Sickle in urology as you need to discuss issues with urination and for follow up.

## 2020-04-08 NOTE — Unmapped (Signed)
AOBP   Patient's blood pressure was taken on right upper arm, medium cuff.   1st reading 159/79, pulse: 60  2nd reading 159/70, pulse: 60  3rd reading 147/67, pulse: 60  Average reading 155/72, pulse: 60
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
## 2020-04-09 LAB — RITUXAN WORKUP
ABSOLUTE CD19 CNT: 30 {cells}/uL — ABNORMAL LOW (ref 105–920)
ABSOLUTE CD3 CNT: 1230 {cells}/uL (ref 915–3400)
CD3% (T CELLS)": 82 % (ref 61–86)
RITUXIN CD45%: 100

## 2020-04-09 LAB — ABSOLUTE CD19 CNT: Lab: 30 — ABNORMAL LOW

## 2020-04-13 NOTE — Unmapped (Signed)
I contacted the patient and his wife to review labs. Kidney function stable. ANCA still pending. I discussed rituxan panel demonstrates early repopulation therefore we may need to consider redosing. However before considering this he needs to undergo repeat urology evaluation for urothelial cancer as well as I recommended Covid vaccination. He and his wife expressed understanding and his wife stated she will call to schedule follow up with urology.

## 2020-04-19 ENCOUNTER — Ambulatory Visit (INDEPENDENT_AMBULATORY_CARE_PROVIDER_SITE_OTHER): Payer: Medicare Other

## 2020-04-19 DIAGNOSIS — Z950 Presence of cardiac pacemaker: Secondary | ICD-10-CM | POA: Diagnosis not present

## 2020-04-19 DIAGNOSIS — I5032 Chronic diastolic (congestive) heart failure: Secondary | ICD-10-CM

## 2020-04-20 NOTE — Progress Notes (Signed)
EPIC Encounter for ICM Monitoring  Patient Name: Jesus Hendrix is a 74 y.o. male Date: 04/20/2020 Primary Care Physican: [place] Pink, MD Primary Cardiologist:Gollan Electrophysiologist:Allred 04/08/2020 OfficeWeight:236lbs    Transmission reviewed.  CorVueThoracic impedancenormal.  Prescribed:Furosemide40 mg take 1 tablet by mouthdaily.  Labs: 01/09/2020 Creatinine 2.61, BUN 48, Potassium 4.5, Sodium 137, GFR 23-27 08/28/2019 Creatinine2.54, BUN48, Potassium4.3, Sodium139, QBV69-45 07/10/2019 Creatinine2.31, BUN41, Potassium4.4, Sodium140, WTU88-28 A complete set of results can be found in Results Review  Recommendations: None  Follow-up plan: ICM clinic phone appointment on 05/24/2020. 91 day device clinic remote transmission6/12/2019.  Office visit with Dr Rockey Situ 07/06/2020.   Copy of ICM check sent to Dr.Allred.  3 month ICM trend: 04/19/2020    1 Year ICM trend:       [name], RN 04/20/2020 1:32 PM

## 2020-04-29 ENCOUNTER — Other Ambulatory Visit: Payer: Self-pay | Admitting: Neurology

## 2020-04-29 DIAGNOSIS — R4189 Other symptoms and signs involving cognitive functions and awareness: Secondary | ICD-10-CM

## 2020-05-04 ENCOUNTER — Encounter: Payer: Self-pay | Admitting: Family

## 2020-05-04 ENCOUNTER — Other Ambulatory Visit: Payer: Self-pay

## 2020-05-04 ENCOUNTER — Ambulatory Visit (INDEPENDENT_AMBULATORY_CARE_PROVIDER_SITE_OTHER): Payer: Medicare Other | Admitting: Family

## 2020-05-04 ENCOUNTER — Other Ambulatory Visit: Payer: Self-pay | Admitting: Family

## 2020-05-04 VITALS — BP 130/70 | HR 60 | Ht 67.0 in | Wt 241.2 lb

## 2020-05-04 DIAGNOSIS — I4821 Permanent atrial fibrillation: Secondary | ICD-10-CM

## 2020-05-04 DIAGNOSIS — I5042 Chronic combined systolic (congestive) and diastolic (congestive) heart failure: Secondary | ICD-10-CM

## 2020-05-04 DIAGNOSIS — R0602 Shortness of breath: Secondary | ICD-10-CM | POA: Diagnosis not present

## 2020-05-04 DIAGNOSIS — I442 Atrioventricular block, complete: Secondary | ICD-10-CM | POA: Diagnosis not present

## 2020-05-04 DIAGNOSIS — I10 Essential (primary) hypertension: Secondary | ICD-10-CM

## 2020-05-04 DIAGNOSIS — I350 Nonrheumatic aortic (valve) stenosis: Secondary | ICD-10-CM

## 2020-05-04 DIAGNOSIS — I5032 Chronic diastolic (congestive) heart failure: Secondary | ICD-10-CM

## 2020-05-04 MED ORDER — TORSEMIDE 20 MG PO TABS: 20.0000 mg | ORAL_TABLET | Freq: Two times a day (BID) | ORAL | 1 refills | Status: DC

## 2020-05-04 NOTE — Progress Notes (Signed)
Office Visit    Patient Name: Jesus Hendrix Date of Encounter: 05/04/2020  Primary Care Provider:  Mary[place] Pink, MD Primary Cardiologist:  Ida Rogue, MD Electrophysiologist:  None   Chief Complaint    Jesus Hendrix is a 74 y.o. male with a hx of persistent atrial fibrillation, chronic back pain and sciatica, CAD s/p CABG 2007, heart block with PPM, HLD, obesity, kidney cancer, Wegener's granulomatosis, ANCA associated vasculitis presents, valvular heart disease today for shortness of breath  Past Medical History    Past Medical History:  Diagnosis Date  . Anxiety   . Aortic stenosis    a. mild on echo 04/2014  . AV block, Mobitz 2    a. 03/2018 s/p SJM Jennette Banker MP RF model CD [id number] BiV PPM (ser #: [id number]).  . Choledocholithiasis   . Chronic systolic CHF (congestive heart failure) ([place])    a. echo 04/2014: EF 45-50%, nl LV dia fxn, mild AS, LA mildly dilated, PASP nl; b. 07/2019 Echo: EF 35-40%, diff HK, Nl RV fxn, RVSP 53.54mHg. Sev dil LA. Mod dil RA. Mild to mod MR. Mod TR. Mod AS. Ao root 37cm.  . CKD (chronic kidney disease), stage IV ([place]   . COPD (chronic obstructive pulmonary disease) ([facility]    Not on home oxygen  . Coronary artery disease    a. status post CABG in 2007; b. nuc stress test 2014: small region of mild ischemia in the inferior territory, EF 61%, no ischemia, scan similar to prior scan in 2009; c. 07/2016 MV: No ischemia.  . Exogenous obesity   . GERD (gastroesophageal reflux disease)   . Gout   . Hiatal hernia   . Hip pain, chronic 08/03/2016   Waiting to have hip replacement pending NM stress test results per patient.  . History of colonic diverticulitis   . History of renal cell carcinoma    a. s/p laser ablation   . Hyperlipidemia   . Hypertension   . Moderate aortic stenosis    a. 07/2019 Echo: Mod AS.  .Marland KitchenNear syncope    a. 3 separate events in 2017  . Nephrolithiasis   . Panic attacks   . Permanent atrial fibrillation ([facility]  06/06/2016   a. Dx 06/06/2016; b. CHADS2VASc => 5 (CHF, HTN, age x 1, DM, vascular disease)-->was on Eliquis but d/c'd 2/2 anemia and hemoptysis.  .Marland KitchenSpinal stenosis   . Temporomandibular joint pain dysfunction syndrome   . Umbilical hernia   . Vitamin D deficiency   . Wegener's granulomatosis with renal involvement ([facility]    Past Surgical History:  Procedure Laterality Date  . CARDIAC CATHETERIZATION    . CHOLECYSTECTOMY    . COLONOSCOPY N/A 06/03/2015   Procedure: COLONOSCOPY;  Surgeon: DLucilla Lame MD;  [place] [facility] [place]  Service: Gastroenterology;  Laterality: N/A;  . CORONARY ARTERY BYPASS GRAFT  2004   CABG x 3  . EYE SURGERY    . HERNIA REPAIR     x 2  . HIP SURGERY     right hip replacement  . INTRAOCULAR LENS INSERTION    . KIDNEY SURGERY     cancer  . NOSE SURGERY    . PACEMAKER IMPLANT N/A 04/05/2018   SJM Biventricular pacemaker implanted by Dr ARayann Hemanfor second degree AV block, EF < 50% and permanent afib  . POLYPECTOMY  06/03/2015   Procedure: POLYPECTOMY;  Surgeon: DLucilla Lame MD;  [facility]  Service: Gastroenterology;;  . PROSTATE  SURGERY    . TOTAL HIP ARTHROPLASTY Left 12/19/2016   Procedure: LEFT TOTAL HIP ARTHROPLASTY;  Surgeon: Garald Balding, MD;  Location: [place];  Service: Orthopedics;  Laterality: Left;    Allergies  Allergies  Allergen Reactions  . Clonazepam Other (See Comments)    Patient is confused for up to 12+ hours after administration. Patient is confused for up to 12+ hours after administration.    History of Present Illness    Jesus Hendrix is a 74 y.o. male with a hx of persistent atrial fibrillation, chronic back pain and sciatica, CAD s/p CABG 2007, CHB s/p PPM, HLD, obesity, kidney cancer, Wegener's granulomatosis, ANCA associated vasculitis, valvular heart disease.  He was last seen January 2021 by Dr. Rockey Situ.  His CAD is a s/p CABG in 2007.  Cardiac catheterization January 2007 with 75-80% long  lesion in LAD at the ostium, 95% lesion in the PDA.  Echocardiogram January 2009 with mild MR, normal LV function, aortic valve sclerosis without significant stenosis.  Stress test January 2009 with large region of decreased perfusion mild to moderate in severity in the inferior wall consistent with possible old MI.  Defect was worse in the inferoapical region.  No evidence of ischemia.  EF 55%.  Exercised for 7 minutes.   Echocardiogram 07/2019 LVEF 35-40%, mild LVH, indeterminate LV diastolic parameters, RV normal size and function, RV systolic pressure moderately elevated 53.3 mmHg, LA severely dilated, RA moderately dilated, MR mild to moderate, moderate TR, mild AI, moderate stenosis of aortic valve, dilation of aortic root 3.7 cm.  At last clinic his anticoagulation was resumed for persistent atrial fibrillation.  It was previously held for hemoptysis in the setting of Wegener's.  Reports worsening shortness of breath for 2 months. When seen in clinic 01/13/20 he weighed 225 pounds, now up to 241 pounds (+16 pounds).   Labs 03/22/20 via Care Everywhere:   Hb 11.4  A1c 6.1  K 4.7, creatnine 2.6, GFR 24, AST 25, ALT 30  Reports orthopnea and PND. No shortness of breath at rest. Reports dyspnea on exertion with minimal exertion. Has been sleeping in the recliner.   Is concerned about swelling in his belly. Reports no lower extremity edema.  Tells me he "doesn't add much" salt to his food. Drinks about water throughout the day - approximately 4-5 sixteen ounce bottles.   EKGs/Labs/Other Studies Reviewed:   The following studies were reviewed today:  Echo 07/2019  1. The left ventricle has moderately reduced systolic function, with an  ejection fraction of 35-40%. The cavity size was normal. There is mildly  increased left ventricular wall thickness. Left ventricular diastolic  Doppler parameters are indeterminate.  Left ventricular diffuse hypokinesis.   2. The right ventricle has normal  systolic function. The cavity was  normal. There is no increase in right ventricular wall thickness. Right  ventricular systolic pressure is moderately elevated with an estimated  pressure of 53.3 mmHg.   3. Left atrial size was severely dilated.   4. Right atrial size was moderately dilated.   5. Mitral valve regurgitation is mild to moderate   6. Tricuspid valve regurgitation is moderate.   7. The aortic valve was not well visualized. Moderate calcification of  the aortic valve. Aortic valve regurgitation is mild by color flow  Doppler. Moderate stenosis of the aortic valve.   8. There is dilatation of the aortic root.3.7 cm   EKG:  EKG is  ordered today.  The ekg ordered today  demonstrates ventricular paced rhythm 60 bpm with underlying atrial fibrillation.   Recent Labs: 07/10/2019: ALT 33 01/09/2020: BUN 48; Creatinine, Ser 2.61; Hemoglobin 11.0; Platelets 276; Potassium 4.5; Sodium 137  Recent Lipid Panel    Component Value Date/Time   CHOL 132 04/05/2018 0236   TRIG 58 04/05/2018 0236   HDL 29 (L) 04/05/2018 0236   CHOLHDL 4.6 04/05/2018 0236   VLDL 12 04/05/2018 0236   LDLCALC 91 04/05/2018 0236    Home Medications   Current Meds  Medication Sig  . acetaminophen (TYLENOL) 325 MG tablet Take 1 tablet by mouth as needed for pain.  Marland Kitchen ALPRAZolam (XANAX) 0.5 MG tablet Take 0.5 mg by mouth at bedtime.  Marland Kitchen apixaban (ELIQUIS) 5 MG TABS tablet Take 1 tablet (5 mg total) by mouth 2 (two) times daily.  . cetirizine (ZYRTEC) 10 MG tablet Take 1 tablet (10 mg total) by mouth daily.  . Cholecalciferol (VITAMIN D) 2000 units CAPS Take 2,000 Units by mouth at bedtime.   . clonazePAM (KLONOPIN) 1 MG tablet Take 1 tablet by mouth as needed for sleep.  . famotidine (PEPCID) 20 MG tablet TAKE 1 TABLET(20 MG) BY MOUTH EVERY DAY AT BEDTIME AS NEEDED FOR HEART BURN  . FLUoxetine (PROZAC) 10 MG capsule Take 10 mg by mouth daily.  . furosemide (LASIX) 40 MG tablet Take 1 tablet (40 mg total) by  mouth daily.  Marland Kitchen losartan (COZAAR) 25 MG tablet Take 1 tablet (25 mg total) by mouth daily.  . pantoprazole (PROTONIX) 40 MG tablet Take 1 tablet (40 mg) by mouth once daily before supper  . tamsulosin (FLOMAX) 0.4 MG CAPS capsule Take 0.4 mg by mouth daily.      Review of Systems       Review of Systems  Constitution: Negative for chills, fever and malaise/fatigue.  Cardiovascular: Positive for dyspnea on exertion, leg swelling and orthopnea. Negative for chest pain, near-syncope, palpitations and syncope.  Respiratory: Positive for shortness of breath. Negative for cough and wheezing.   Gastrointestinal: Negative for nausea and vomiting.  Neurological: Negative for dizziness, light-headedness and weakness.   All other systems reviewed and are otherwise negative except as noted above.  Physical Exam    VS:  BP 130/70 (BP Location: Left Arm, Patient Position: Sitting, Cuff Size: Large)   Pulse 60   Ht 5' 7"  (1.702 m)   Wt 241 lb 4 oz (109.4 kg)   SpO2 96%   BMI 37.79 kg/m  , BMI Body mass index is 37.79 kg/m. GEN: Well nourished, overweight, well developed, in no acute distress. HEENT: normal. Neck: Supple, no JVD, carotid bruits, or masses. Cardiac: RRR, no  rubs, or gallops. Gr 2/6 systolic murmur best auscultated at RUSB. No clubbing, cyanosis. Bilateral lower extremity with non pitting edema. .  Radials/DP/PT 2+ and equal bilaterally.  Respiratory:  Respirations regular and unlabored, clear to auscultation bilaterally. GI: Soft, nontender, nondistended, BS + x 4. MS: No deformity or atrophy. Skin: Warm and dry, no rash. Neuro:  Strength and sensation are intact. Psych: Normal affect.  Assessment & Plan    1. Complete heart block s/p PPM -follows with Dr. Caryl Comes of EP.  Pacemaker functioning appropriately on EKG today.  2. CAD s/p CABG -no chest pain, pressure .  No indication for ischemic evaluation this time.  Will need addition of statin, as below.  No aspirin in  setting of anticoagulation.   3. HFrEF - Echo 07/2019 LVEF 35-40%.  Volume overloaded and up approximately 16  pounds.  Symptomatic with dyspnea, orthopnea, abdominal bloating.  Stop Lasix.  Start torsemide 20 mg twice daily.  Be met today.  Plan to monitor closely in the setting of single kidney, reduced renal function.  Plan of care discussed with his primary cardiologist Dr. Arta Bruce in clinic today.  He will call our office Thursday to report weight.  Follow-up in 1 week.  4. Persistent atrial fibrillation -EKG today shows ventricular paced rhythm with likely underlying atrial fibrillation.  Continue Eliquis 5 mg twice daily for anticoagulation.  Not presently on any rate limiting therapies.  5. Chronic anticoagulation -Eliquis previously held for hemoptysis secondary to Wegener's.  It was resumed 01/13/2020.  Reports no bleeding complications.  CBC today for monitoring.   6. Valvular heart disease - Echo 07/2019 moderate aortic stenosis, mild-moderate MR, moderate TR. volume overloaded, as above.  May consider repeat echo pending response to diuretic therapy.  Reports no chest pain or near syncope suggestive of worsening aortic stenosis though his volume status is certainly concerning.  7. HTN -well controlled.  Continue his antihypertensive regimen.  8. HLD, LDL goal less than 70 - Not presently on statin. No noted hx of intolerance.  Will need to be addressed after resolution of acute on chronic heart failure.  Disposition: Follow up in 1 week(s) with Dr. Rockey Situ or APP   Loel Dubonnet, NP 05/04/2020, 11:19 AM

## 2020-05-04 NOTE — Patient Instructions (Addendum)
Medication Instructions:  Your physician has recommended you make the following change in your medication:   STOP Lasix  START Torsemide 20mg  twice daily (take in the morning and early afternoon)  *If you need a refill on your cardiac medications before your next appointment, please call your pharmacy*   Lab Work: Your physician recommends that you return for lab work today: BNP, BMET, CBC  If you have labs (blood work) drawn today and your tests are completely normal, you will receive your results only by: Marland Kitchen MyChart Message (if you have MyChart) OR . A paper copy in the mail If you have any lab test that is abnormal or we need to change your treatment, we will call you to review the results.   Testing/Procedures: EKG today shows pacemaker functioning appropriately.   REDS Vest today shows you have excess fluid.    Follow-Up: At [facility], you and your health needs are our priority.  As part of our continuing mission to provide you with exceptional heart care, we have created designated Provider Care Teams.  These Care Teams include your primary Cardiologist (physician) and Advanced Practice Providers (APPs -  Physician Assistants and Nurse Practitioners) who all work together to provide you with the care you need, when you need it.  We recommend signing up for the patient portal called "MyChart".  Sign up information is provided on this After Visit Summary.  MyChart is used to connect with patients for Virtual Visits (Telemedicine).  Patients are able to view lab/test results, encounter notes, upcoming appointments, etc.  Non-urgent messages can be sent to your provider as well.   To learn more about what you can do with MyChart, go to [email or web address].    Your next appointment:   1 week(s)  The format for your next appointment:   In Person  Provider:    You may see Ida Rogue, MD or one of the following Advanced Practice Providers on your designated Care Team:     Murray Hodgkins, NP  Christell Faith, PA-C  Marrianne Mood, PA-C  Other Instructions  Weigh yourself daily and keep a log. Call us Thursday to report weight to ensure it is going down.   Add extra pillow at night to sleep.  Reduce to 4 bottles of water per day.   Use peppermint or hard candy for dry mouth.   Low salt diet.

## 2020-05-05 LAB — BASIC METABOLIC PANEL
BUN/Creatinine Ratio: 19 (ref 10–24)
BUN: 45 mg/dL — ABNORMAL HIGH (ref 8–27)
CO2: 21 mmol/L (ref 20–29)
Calcium: 8.9 mg/dL (ref 8.6–10.2)
Chloride: 109 mmol/L — ABNORMAL HIGH (ref 96–106)
Creatinine, Ser: 2.33 mg/dL — ABNORMAL HIGH (ref 0.76–1.27)
GFR calc Af Amer: 30 mL/min/{1.73_m2} — ABNORMAL LOW (ref >59)
GFR calc non Af Amer: 26 mL/min/{1.73_m2} — ABNORMAL LOW (ref >59)
Glucose: 116 mg/dL — ABNORMAL HIGH (ref 65–99)
Potassium: 5.4 mmol/L — ABNORMAL HIGH (ref 3.5–5.2)
Sodium: 142 mmol/L (ref 134–144)

## 2020-05-05 LAB — CBC
Hematocrit: 33.7 % — ABNORMAL LOW (ref 37.5–51.0)
Hemoglobin: 11.6 g/dL — ABNORMAL LOW (ref 13.0–17.7)
MCH: 34.5 pg — ABNORMAL HIGH (ref 26.6–33.0)
MCHC: 34.4 g/dL (ref 31.5–35.7)
MCV: 100 fL — ABNORMAL HIGH (ref 79–97)
Platelets: 251 10*3/uL (ref 150–450)
RBC: 3.36 x10E6/uL — ABNORMAL LOW (ref 4.14–5.80)
RDW: 13.3 % (ref 11.6–15.4)
WBC: 6.9 10*3/uL (ref 3.4–10.8)

## 2020-05-05 LAB — PRO B NATRIURETIC PEPTIDE: NT-Pro BNP: 2585 pg/mL — ABNORMAL HIGH (ref 0–486)

## 2020-05-06 NOTE — Addendum Note (Signed)
Addended by: Raelene Bott, Iyan Flett L on: 05/06/2020 04:00 PM   Modules accepted: Orders

## 2020-05-07 ENCOUNTER — Ambulatory Visit: Payer: Medicare Other

## 2020-05-09 NOTE — Progress Notes (Deleted)
[name], male   DOB: [birth year], 74 y.o.   MRN: [id number] Cardiology Office Note  Date:  05/09/2020   ID:  Jesus Hendrix, Jesus Hendrix [birth year], MRN [id number]  PCP:  Jesus Pink, MD   No chief complaint on file.   HPI:  Jesus Hendrix is a 74 year old gentleman with  Persistent atrial fibrillation chronic back pain and sciatica down his left leg,  coronary artery disease and bypass surgery in 2007,  hyperlipidemia,  obesity,  kidney cancer,  status post laser ablation at [facility], ANCA associated vasculitis, Wegener's granulomatosis,  previous history of dizziness which he attributes to his Wegener's followed by [facility] nephrology -Medication noncompliance EF 35%, pulm HTN who presents for routine followup of his coronary artery disease, atrial fibrillation, heart block with pacer, Wegener's, hemoptysis, blood loss  Having drainage in head?, seems to be coughing up a lot, clear sputum No hemoptysis Compliant with medications, wife does it for him  Leg swelling resolved on higher dose Lasix 40 daily Polyuria/nocturia bothers him Drinks a lot of fluids after dinner Self caths 3x a day Has difficulty making it until morning, sides start to hurt, some urgency for seeing in the morning  Chronic mild shortness of breath on exertion, deconditioned, no regular walking program  EKG personally reviewed by myself on todays visit Shows paced rhythm rate 60 bpm likely underlying atrial fibrillation  Other past medical history reviewed heart rate in the 30s, transferred to [facility], Pacerplacedforcomplete heart block switch to eliquis  Chronic severe back pain, walking with a limp Till working, digging, foundations and sewage Followed by Dr. 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 Known atrophic left kidney.  Previous issues with anxiety  cardiac catheterization in January 2007 details 75-80% long lesion in the LAD at the ostium, 95% lesion in the PDA. He was referred to bypass surgery. 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 Defect worse in the inferoapical region. No ischemia. Ejection fraction 55%. Exercised for 7 METS  Previous history of myalgias on high-dose cholesterol medication. Tolerating Lipitor 30 mg daily  PMH:   has a past medical history of Anxiety, Aortic stenosis, AV block, Mobitz 2, Choledocholithiasis, Chronic systolic CHF (congestive heart failure) ([place]), CKD (chronic kidney disease), stage IV ([place]), COPD (chronic obstructive pulmonary disease) ([place]), Coronary artery disease, Exogenous obesity, GERD (gastroesophageal reflux disease), Gout, Hiatal hernia, Hip pain, chronic (08/03/2016), History of colonic diverticulitis, History of renal cell carcinoma, Hyperlipidemia, Hypertension, Moderate aortic stenosis, Near syncope, Nephrolithiasis, Panic attacks, Permanent atrial fibrillation ([place]) (06/06/2016), Spinal stenosis, Temporomandibular joint pain dysfunction syndrome, Umbilical hernia, Vitamin D deficiency, and Wegener's granulomatosis with renal involvement ([place]).  PSH:    Past Surgical History:  Procedure Laterality Date  . CARDIAC CATHETERIZATION    . CHOLECYSTECTOMY    . COLONOSCOPY N/A 06/03/2015   Procedure: COLONOSCOPY;  Surgeon: Jesus Lame, MD;  Location: [place];  Service: Gastroenterology;  Laterality: N/A;  . CORONARY ARTERY BYPASS GRAFT  2004   CABG x 3  . EYE SURGERY    . HERNIA REPAIR     x 2  .  HIP SURGERY     right hip replacement  .  INTRAOCULAR LENS INSERTION    . KIDNEY SURGERY     cancer  . NOSE SURGERY    . PACEMAKER IMPLANT N/A 04/05/2018   SJM Biventricular pacemaker implanted by Dr Jesus Hendrix for second degree AV block, EF < 50% and permanent afib  . POLYPECTOMY  06/03/2015   Procedure: POLYPECTOMY;  Surgeon: Jesus Lame, MD;  Location: [place];  Service: Gastroenterology;;  . PROSTATE SURGERY    . TOTAL HIP ARTHROPLASTY Left 12/19/2016   Procedure: LEFT TOTAL HIP ARTHROPLASTY;  Surgeon: Jesus Balding, MD;  Location: [place];  Service: Orthopedics;  Laterality: Left;    Current Outpatient Medications  Medication Sig Dispense Refill  . acetaminophen (TYLENOL) 325 MG tablet Take 1 tablet by mouth as needed for pain.    Marland Kitchen ALPRAZolam (XANAX) 0.5 MG tablet Take 0.5 mg by mouth at bedtime.    Marland Kitchen apixaban (ELIQUIS) 5 MG TABS tablet Take 1 tablet (5 mg total) by mouth 2 (two) times daily. 60 tablet 11  . cetirizine (ZYRTEC) 10 MG tablet Take 1 tablet (10 mg total) by mouth daily. 30 tablet 3  . Cholecalciferol (VITAMIN D) 2000 units CAPS Take 2,000 Units by mouth at bedtime.     . clonazePAM (KLONOPIN) 1 MG tablet Take 1 tablet by mouth as needed for sleep.    . famotidine (PEPCID) 20 MG tablet TAKE 1 TABLET(20 MG) BY MOUTH EVERY DAY AT BEDTIME AS NEEDED FOR HEART BURN 90 tablet 0  . FLUoxetine (PROZAC) 10 MG capsule Take 10 mg by mouth daily.    Marland Kitchen losartan (COZAAR) 25 MG tablet Take 1 tablet (25 mg total) by mouth daily. 90 tablet 3  . mycophenolate (CELLCEPT) 250 MG capsule Take 2 capsules (500 mg) by mouth twice daily    . pantoprazole (PROTONIX) 40 MG tablet Take 1 tablet (40 mg) by mouth once daily before supper    . predniSONE (DELTASONE) 10 MG tablet Take 0.5 tablets by mouth daily.    . tamsulosin (FLOMAX) 0.4 MG CAPS capsule Take 0.4 mg by mouth daily.    Marland Kitchen torsemide (DEMADEX) 20 MG tablet Take 1 tablet (20 mg total) by mouth 2 (two) times daily. 60 tablet 1   No current facility-administered  medications for this visit.     Allergies:   Clonazepam   Social History:  The patient  reports that he quit smoking about 45 years ago. His smoking use included cigarettes. He has a 12.50 pack-year smoking history. He has never used smokeless tobacco. He reports that he does not drink alcohol or use drugs.   Family History:   family history includes Heart disease in his brother, mother, and sister.    Review of Systems: Review of Systems  Constitutional: Negative.   HENT: Negative.   Respiratory: Positive for shortness of breath.   Cardiovascular: Negative.   Gastrointestinal: Negative.   Musculoskeletal: Positive for back pain.  Neurological: Negative.   All other systems reviewed and are negative.   PHYSICAL EXAM: VS:  There were no vitals taken for this visit. , BMI There is no height or weight on file to calculate BMI. Constitutional:  oriented to person, place, and time. No distress.  Presents in a wheelchair HENT:  Head: Grossly normal Eyes:  no discharge. No scleral icterus.  Neck: No JVD, no carotid bruits  Cardiovascular: Regular rate and rhythm, no murmurs appreciated Pulmonary/Chest: Clear to auscultation bilaterally, no wheezes or rails  Abdominal: Soft.  no distension.  no tenderness.  Musculoskeletal: Normal range of motion Neurological:  normal muscle tone. Coordination normal. No atrophy Skin: Skin warm and dry Psychiatric: normal affect, pleasant  Recent Labs: 07/10/2019: ALT 33 05/04/2020: BUN 45; Creatinine, Ser 2.33; Hemoglobin 11.6; NT-Pro BNP 2,585; Platelets 251; Potassium 5.4; Sodium 142    Lipid Panel Lab Results  Component Value Date   CHOL 132 04/05/2018   HDL 29 (L) 04/05/2018   LDLCALC 91 04/05/2018   TRIG 58 04/05/2018      Wt Readings from Last 3 Encounters:  05/04/20 241 lb 4 oz (109.4 kg)  01/13/20 225 lb (102.1 kg)  10/15/19 234 lb (106.1 kg)      ASSESSMENT AND PLAN:  Complete heart block Followed by EP Paced  rhythm  Wegener's/hemoptysis Close follow-up with [facility] Compliance with his medications No hemoptysis Long discussion with him concerning restarting anticoagulation As detailed below  compliance with his medications on dapsone, CellCept prednisone  Anemia Stable numbers off Eliquis Order for CBC placed in several weeks time after restarting Eliquis today  Coronary artery disease involving native coronary artery of native heart with stable angina pectoris -  Currently with no symptoms of angina. No further workup at this time. Continue current medication regimen.  Shortness of breath Improved symptoms with Lasix 40 daily, creatinine slightly above baseline Recommend he stay on Lasix 40 daily hold dose on Sunday  Persistent atrial fibrillation (HCC) - Eliquis previously on hold for hemoptysis secondary to Wegener's Denies any recent symptoms, compliant with his medications Recommend he restart Eliquis 5 twice daily CBC several weeks time Suggest he call us for any hemoptysis Risk and benefit of anticoagulation discussed with him  Essential hypertension Blood pressure well controlled, no changes made  Hyperlipidemia Statin was held previously, not from side effects Some medication noncompliance, will follow for now and discussed with him in follow-up  S/P CABG x 3 Denies anginal symptoms, prior stress test no ischemia No further workup  Chronic low back pain Seen by Dr. Glennie [place]   Total encounter time more than 25 minutes  Greater than 50% was spent in counseling and coordination of care with the patient   No orders of the defined types were placed in this encounter.    Signed, Esmond Plants, M.D., Ph.D. 05/09/2020  [place], [place]

## 2020-05-11 ENCOUNTER — Ambulatory Visit: Payer: Medicare Other | Admitting: Cardiovascular Disease

## 2020-05-12 ENCOUNTER — Encounter: Payer: Self-pay | Admitting: Cardiovascular Disease

## 2020-05-18 ENCOUNTER — Ambulatory Visit (INDEPENDENT_AMBULATORY_CARE_PROVIDER_SITE_OTHER): Payer: Medicare Other | Admitting: *Deleted

## 2020-05-18 DIAGNOSIS — I5032 Chronic diastolic (congestive) heart failure: Secondary | ICD-10-CM

## 2020-05-18 DIAGNOSIS — I442 Atrioventricular block, complete: Secondary | ICD-10-CM

## 2020-05-18 LAB — CUP PACEART REMOTE DEVICE CHECK
Battery Remaining Longevity: 106 mo
Battery Remaining Percentage: 95.5 %
Battery Voltage: 2.99 V
Date Time Interrogation Session: 20210601023700
Implantable Lead Implant Date: 20190419
Implantable Lead Implant Date: 20190419
Implantable Lead Location: 753858
Implantable Lead Location: 753860
Implantable Pulse Generator Implant Date: 20190419
Lead Channel Impedance Value: 430 Ohm
Lead Channel Impedance Value: 760 Ohm
Lead Channel Pacing Threshold Amplitude: 0.75 V
Lead Channel Pacing Threshold Amplitude: 1 V
Lead Channel Pacing Threshold Pulse Width: 0.5 ms
Lead Channel Pacing Threshold Pulse Width: 0.5 ms
Lead Channel Sensing Intrinsic Amplitude: 12 mV
Lead Channel Setting Pacing Amplitude: 2 V
Lead Channel Setting Pacing Amplitude: 2.5 V
Lead Channel Setting Pacing Pulse Width: 0.5 ms
Lead Channel Setting Pacing Pulse Width: 0.5 ms
Lead Channel Setting Sensing Sensitivity: 4 mV
Pulse Gen Model: 3262
Pulse Gen Serial Number: 9017264

## 2020-05-19 NOTE — Progress Notes (Signed)
Remote pacemaker transmission.   

## 2020-05-24 ENCOUNTER — Ambulatory Visit (INDEPENDENT_AMBULATORY_CARE_PROVIDER_SITE_OTHER): Payer: Medicare Other

## 2020-05-24 DIAGNOSIS — Z950 Presence of cardiac pacemaker: Secondary | ICD-10-CM

## 2020-05-24 DIAGNOSIS — I5032 Chronic diastolic (congestive) heart failure: Secondary | ICD-10-CM

## 2020-05-24 NOTE — Progress Notes (Signed)
EPIC Encounter for ICM Monitoring  Patient Name: Jesus Hendrix is a 74 y.o. male Date: 05/24/2020 Primary Care Physican: Maryland Pink, MD Primary Cardiologist:Gollan Electrophysiologist:Allred 04/08/2020 OfficeWeight:236lbs    Attempted call to patient and unable to reach.  Transmission reviewed.   CorVueThoracic impedancesuggesting possible fluid accumulation since 05/18/2020.  Prescribed:Furosemide40 mg take 1 tablet by mouthdaily.  Labs: 01/09/2020 Creatinine 2.61, BUN 48, Potassium 4.5, Sodium 137, GFR 23-27 08/28/2019 Creatinine2.54, BUN48, Potassium4.3, Sodium139, [id number] 07/10/2019 Creatinine2.31, BUN41, Potassium4.4, Sodium140, [id number] A complete set of results can be found in Results Review  Recommendations: Unable to reach.    Follow-up plan: ICM clinic phone appointment on 06/01/2020 to recheck fluid levels. 91 day device clinic remote transmission8/31/2021.  Office visit with Dr Rockey Situ 07/06/2020.   Copy of ICM check sent to Dr.Allred and Dr Rockey Situ for review and recommendations if needed.  3 month ICM trend: 05/24/2020    1 Year ICM trend:       [name], RN 05/24/2020 10:42 AM

## 2020-06-01 ENCOUNTER — Ambulatory Visit (INDEPENDENT_AMBULATORY_CARE_PROVIDER_SITE_OTHER): Payer: Medicare Other

## 2020-06-01 DIAGNOSIS — I5042 Chronic combined systolic (congestive) and diastolic (congestive) heart failure: Secondary | ICD-10-CM

## 2020-06-01 DIAGNOSIS — Z950 Presence of cardiac pacemaker: Secondary | ICD-10-CM

## 2020-06-02 ENCOUNTER — Other Ambulatory Visit: Payer: Self-pay | Admitting: *Deleted

## 2020-06-02 MED ORDER — TORSEMIDE 20 MG PO TABS: 20.0000 mg | ORAL_TABLET | Freq: Two times a day (BID) | ORAL | 2 refills | Status: DC

## 2020-06-02 NOTE — Progress Notes (Signed)
EPIC Encounter for ICM Monitoring  Patient Name: Jesus Hendrix is a 74 y.o. male Date: 06/02/2020 Primary Care Physican: Maryland Pink, MD Primary Cardiologist:Gollan Electrophysiologist:Allred 04/08/2020 OfficeWeight:236lbs     Transmission reviewed.   CorVueThoracic impedancesuggesting fluid levels returned to normal.  Prescribed:Furosemide40 mg take 1 tablet by mouthdaily.  Labs: 01/09/2020 Creatinine 2.61, BUN 48, Potassium 4.5, Sodium 137, GFR 23-27 08/28/2019 Creatinine2.54, BUN48, Potassium4.3, Sodium139, [id number] 07/10/2019 Creatinine2.31, BUN41, Potassium4.4, Sodium140, [id number] A complete set of results can be found in Results Review  Recommendations: None  Follow-up plan: ICM clinic phone appointment on7/19/2021. 91 day device clinic remote transmission8/31/2021.Office visit with Dr Rockey Situ 07/06/2020.  Copy of ICM check sent to Dr.Allred   3 month ICM trend: 06/02/2020    1 Year ICM trend:       [name], RN 06/02/2020 3:31 PM

## 2020-06-07 ENCOUNTER — Ambulatory Visit (INDEPENDENT_AMBULATORY_CARE_PROVIDER_SITE_OTHER): Payer: Medicare Other | Admitting: Family

## 2020-06-07 ENCOUNTER — Encounter: Payer: Self-pay | Admitting: Physician Assistant

## 2020-06-07 ENCOUNTER — Other Ambulatory Visit: Payer: Self-pay

## 2020-06-07 VITALS — BP 128/60 | HR 66 | Ht 68.0 in | Wt 233.0 lb

## 2020-06-07 DIAGNOSIS — I25118 Atherosclerotic heart disease of native coronary artery with other forms of angina pectoris: Secondary | ICD-10-CM

## 2020-06-07 DIAGNOSIS — I350 Nonrheumatic aortic (valve) stenosis: Secondary | ICD-10-CM

## 2020-06-07 DIAGNOSIS — I4821 Permanent atrial fibrillation: Secondary | ICD-10-CM | POA: Diagnosis not present

## 2020-06-07 DIAGNOSIS — N184 Chronic kidney disease, stage 4 (severe): Secondary | ICD-10-CM

## 2020-06-07 DIAGNOSIS — I5032 Chronic diastolic (congestive) heart failure: Secondary | ICD-10-CM

## 2020-06-07 DIAGNOSIS — I10 Essential (primary) hypertension: Secondary | ICD-10-CM

## 2020-06-07 DIAGNOSIS — Z950 Presence of cardiac pacemaker: Secondary | ICD-10-CM | POA: Diagnosis not present

## 2020-06-07 DIAGNOSIS — R4189 Other symptoms and signs involving cognitive functions and awareness: Secondary | ICD-10-CM

## 2020-06-07 NOTE — Progress Notes (Signed)
Office Visit    Patient Name: Jesus Hendrix Date of Encounter: 06/07/2020  Primary Care Provider:  [place] Pink, MD Primary Cardiologist:  Ida Rogue, MD Electrophysiologist:  None   Chief Complaint    Jesus Hendrix is a 74 y.o. male with a hx of persistent atrial fibrillation, chronic back pain and sciatica, CAD s/p CABG 2007, heart block with PPM, HLD, obesity, kidney cancer, Wegener's granulomatosis, ANCA associated vasculitis presents, valvular heart disease today for follow up of HFpEF Past Medical History    Past Medical History:  Diagnosis Date  . Anxiety   . Aortic stenosis    a. mild on echo 04/2014  . AV block, Mobitz 2    a. 03/2018 s/p SJM Jennette Banker MP RF model CD [id number] BiV PPM (ser #: [id number]).  . Choledocholithiasis   . Chronic systolic CHF (congestive heart failure) ([place])    a. echo 04/2014: EF 45-50%, nl LV dia fxn, mild AS, LA mildly dilated, PASP nl; b. 07/2019 Echo: EF 35-40%, diff HK, Nl RV fxn, RVSP 53.18mmHg. Sev dil LA. Mod dil RA. Mild to mod MR. Mod TR. Mod AS. Ao root 37cm.  . CKD (chronic kidney disease), stage IV ([place])   . COPD (chronic obstructive pulmonary disease) ([place])    Not on home oxygen  . Coronary artery disease    a. status post CABG in 2007; b. nuc stress test 2014: small region of mild ischemia in the inferior territory, EF 61%, no ischemia, scan similar to prior scan in 2009; c. 07/2016 MV: No ischemia.  . Exogenous obesity   . GERD (gastroesophageal reflux disease)   . Gout   . Hiatal hernia   . Hip pain, chronic 08/03/2016   Waiting to have hip replacement pending NM stress test results per patient.  . History of colonic diverticulitis   . History of renal cell carcinoma    a. s/p laser ablation   . Hyperlipidemia   . Hypertension   . Moderate aortic stenosis    a. 07/2019 Echo: Mod AS.  Marland Kitchen Near syncope    a. 3 separate events in 2017  . Nephrolithiasis   . Panic attacks   . Permanent atrial fibrillation ([place])  06/06/2016   a. Dx 06/06/2016; b. CHADS2VASc => 5 (CHF, HTN, age x 1, DM, vascular disease)-->was on Eliquis but d/c'd 2/2 anemia and hemoptysis.  Marland Kitchen Spinal stenosis   . Temporomandibular joint pain dysfunction syndrome   . Umbilical hernia   . Vitamin D deficiency   . Wegener's granulomatosis with renal involvement [facility])    Past Surgical History:  Procedure Laterality Date  . CARDIAC CATHETERIZATION    . CHOLECYSTECTOMY    . COLONOSCOPY N/A 06/03/2015   Procedure: COLONOSCOPY;  Surgeon: Lucilla Lame, MD;  Location: [place];  Service: Gastroenterology;  Laterality: N/A;  . CORONARY ARTERY BYPASS GRAFT  2004   CABG x 3  . EYE SURGERY    . HERNIA REPAIR     x 2  . HIP SURGERY     right hip replacement  . INTRAOCULAR LENS INSERTION    . KIDNEY SURGERY     cancer  . NOSE SURGERY    . PACEMAKER IMPLANT N/A 04/05/2018   SJM Biventricular pacemaker implanted by Dr Rayann Heman for second degree AV block, EF < 50% and permanent afib  . POLYPECTOMY  06/03/2015   Procedure: POLYPECTOMY;  Surgeon: Lucilla Lame, MD;  Location: [place];  Service: Gastroenterology;;  . PROSTATE  SURGERY    . TOTAL HIP ARTHROPLASTY Left 12/19/2016   Procedure: LEFT TOTAL HIP ARTHROPLASTY;  Surgeon: Garald Balding, MD;  Location: [place];  Service: Orthopedics;  Laterality: Left;    Allergies  Allergies  Allergen Reactions  . Clonazepam Other (See Comments)    Patient is confused for up to 12+ hours after administration. Patient is confused for up to 12+ hours after administration.    History of Present Illness    Jesus Hendrix is a 74 y.o. male with a hx of persistent atrial fibrillation, chronic back pain and sciatica, CAD s/p CABG 2007, CHB s/p PPM, HLD, obesity, kidney cancer, Wegener's granulomatosis, ANCA associated vasculitis, valvular heart disease.  He was last seen 05/04/20.  His CAD is a s/p CABG in 2007.  Cardiac catheterization January 2007 with 75-80% long lesion in LAD at the  ostium, 95% lesion in the PDA.  Echocardiogram January 2009 with mild MR, normal LV function, aortic valve sclerosis without significant stenosis.  Stress test January 2009 with large region of decreased perfusion mild to moderate in severity in the inferior wall consistent with possible old MI.  Defect was worse in the inferoapical region.  No evidence of ischemia.  EF 55%.  Exercised for 7 minutes.   Echocardiogram 07/2019 LVEF 35-40%, mild LVH, indeterminate LV diastolic parameters, RV normal size and function, RV systolic pressure moderately elevated 53.3 mmHg, LA severely dilated, RA moderately dilated, MR mild to moderate, moderate TR, mild AI, moderate stenosis of aortic valve, dilation of aortic root 3.7 cm.  At last clinic his anticoagulation was resumed for persistent atrial fibrillation.  It was previously held for hemoptysis in the setting of Wegener's.  Clinic visit 04/2020 his was up 16 lbs with associated abdominal edema, shortness of breath, orthopnea. His Lasix was changed to Torsemide.   Reports today for overdue 1 week follow up. Reports improvement in abdominal edema, DOE. Denies orthopnea, PND, SOB at rest, LE edema. He is down 8 lbs compared to visit one month ago.   Chief complaint today of "weakness" in his legs. No pain in legs, no symptoms of claudications. States he can't walk as well as he used to and feels he has impaired mobility of his bilateral LE. Has previously worked with a "leg doctor" (unclear orthopedics versus PT) but quit going for unclear reason.  EKGs/Labs/Other Studies Reviewed:   The following studies were reviewed today:  Echo 07/2019  1. The left ventricle has moderately reduced systolic function, with an  ejection fraction of 35-40%. The cavity size was normal. There is mildly  increased left ventricular wall thickness. Left ventricular diastolic  Doppler parameters are indeterminate.  Left ventricular diffuse hypokinesis.   2. The right ventricle has  normal systolic function. The cavity was  normal. There is no increase in right ventricular wall thickness. Right  ventricular systolic pressure is moderately elevated with an estimated  pressure of 53.3 mmHg.   3. Left atrial size was severely dilated.   4. Right atrial size was moderately dilated.   5. Mitral valve regurgitation is mild to moderate   6. Tricuspid valve regurgitation is moderate.   7. The aortic valve was not well visualized. Moderate calcification of  the aortic valve. Aortic valve regurgitation is mild by color flow  Doppler. Moderate stenosis of the aortic valve.   8. There is dilatation of the aortic root.3.7 cm   EKG:  EKG is  ordered today.  The ekg ordered today demonstrates ventricular paced  rhythm 60 bpm with underlying atrial fibrillation.  Recent Labs: 07/10/2019: ALT 33 05/04/2020: BUN 45; Creatinine, Ser 2.33; Hemoglobin 11.6; NT-Pro BNP 2,585; Platelets 251; Potassium 5.4; Sodium 142  Recent Lipid Panel    Component Value Date/Time   CHOL 132 04/05/2018 0236   TRIG 58 04/05/2018 0236   HDL 29 (L) 04/05/2018 0236   CHOLHDL 4.6 04/05/2018 0236   VLDL 12 04/05/2018 0236   LDLCALC 91 04/05/2018 0236    Home Medications   Current Meds  Medication Sig  . acetaminophen (TYLENOL) 325 MG tablet Take 1 tablet by mouth as needed for pain.  Marland Kitchen ALPRAZolam (XANAX) 0.5 MG tablet Take 0.5 mg by mouth at bedtime.  Marland Kitchen apixaban (ELIQUIS) 5 MG TABS tablet Take 1 tablet (5 mg total) by mouth 2 (two) times daily.  . cetirizine (ZYRTEC) 10 MG tablet Take 1 tablet (10 mg total) by mouth daily.  . Cholecalciferol (VITAMIN D) 2000 units CAPS Take 2,000 Units by mouth at bedtime.   . clonazePAM (KLONOPIN) 1 MG tablet Take 1 tablet by mouth as needed for sleep.  . famotidine (PEPCID) 20 MG tablet TAKE 1 TABLET(20 MG) BY MOUTH EVERY DAY AT BEDTIME AS NEEDED FOR HEART BURN  . FLUoxetine (PROZAC) 10 MG capsule Take 10 mg by mouth daily.  Marland Kitchen losartan (COZAAR) 25 MG tablet Take 1  tablet (25 mg total) by mouth daily.  . pantoprazole (PROTONIX) 40 MG tablet Take 1 tablet (40 mg) by mouth once daily before supper  . tamsulosin (FLOMAX) 0.4 MG CAPS capsule Take 0.4 mg by mouth daily.  Marland Kitchen torsemide (DEMADEX) 20 MG tablet Take 1 tablet (20 mg total) by mouth 2 (two) times daily.    Review of Systems     Review of Systems  Constitutional: Negative for chills, fever and malaise/fatigue.  Cardiovascular: Positive for dyspnea on exertion. Negative for chest pain, leg swelling, near-syncope, orthopnea, palpitations and syncope.  Respiratory: Negative for cough, shortness of breath and wheezing.   Musculoskeletal: Positive for arthritis and muscle weakness (legs).  Gastrointestinal: Negative for nausea and vomiting.  Neurological: Negative for dizziness, light-headedness and weakness.   All other systems reviewed and are otherwise negative except as noted above.  Physical Exam    VS:  BP 128/60 (BP Location: Right Arm, Patient Position: Sitting, Cuff Size: Normal)   Pulse 66   Ht 5\' 8"  (1.727 m)   Wt 233 lb (105.7 kg)   SpO2 97%   BMI 35.43 kg/m  , BMI Body mass index is 35.43 kg/m. GEN: Well nourished, overweight, well developed, in no acute distress. HEENT: normal. Neck: Supple, no JVD, carotid bruits, or masses. Cardiac: RRR, no  rubs, or gallops. Gr 2/6 systolic murmur best auscultated at RUSB. No clubbing, cyanosis. No edema.  Radials/DP/PT 2+ and equal bilaterally.  Respiratory:  Respirations regular and unlabored, clear to auscultation bilaterally. GI: Soft, nontender, nondistended, BS + x 4. MS: No deformity or atrophy. Skin: Warm and dry, no rash. Neuro:  Strength and sensation are intact. Psych: Normal affect.  Assessment & Plan    1. Complete heart block s/p PPM - Follows with Dr. Caryl Comes of EP.  Pacemaker functioning appropriately on EKG today.  2. CAD s/p CABG-no chest pain, pressure .  No indication for ischemic evaluation this time. No aspirin in  setting of anticoagulation. Continue statin.   3. HFrEF - Echo 07/2019 LVEF 35-40%.  Down 8 lb from clinic visit 1 month ago. Continue Torsemide 20mg  twice daily. Resolution of abdominal  bloating and improvement in DOE. BMP, BNP for monitoring.   4. Cognitive changes - Concern for memory and cognitive impairment on evaluation today. Has been seen by neurology earlier this year for the same. Shares with me that his wife is worried about his memory. Was started on Donezepil. May be beneficial to have his wife present for future visits as she manages his medications.   5. CKD - Single kidney. Careful titration of diuretics and antihypertensives. Has seen nephrology with [facility] but unclear if he has additional follow up planned.   6. Persistent atrial fibrillation -EKG today shows ventricular paced rhythm with likely underlying atrial fibrillation.  Continue Eliquis 5 mg twice daily for anticoagulation. No bleeding complications. Does not meet criteria for reduced dosing.  Not presently on any rate limiting therapies.   7. Valvular heart disease - Echo 07/2019 moderate aortic stenosis, mild-moderate MR, moderate TR. Consider repeat echo at next office visit for continued monitoring.  Reports no chest pain or near syncope suggestive of worsening aortic stenosis.  8. HTN -well controlled.  Continue his antihypertensive regimen.  9. HLD, LDL goal less than 70 -  Continue Atorvastatin 20mg  daily.   10. Bilateral LE weakness - No signs of claudication. Bilateral pedal pulses palpable. Likely arthritis vs deconditioning in etiology. Noted history of orthopedic surgery. Encouraged to follow up with PCP.   Disposition: Follow up in 1 month(s) with Dr. Rockey Situ as previously scheduled.  Loel Dubonnet, NP 06/07/2020, 4:52 PM

## 2020-06-07 NOTE — Patient Instructions (Addendum)
Medication Instructions:  No medication changes today.  *If you need a refill on your cardiac medications before your next appointment, please call your pharmacy*   Lab Work: Your physician recommends that have lab work today: NT-ProBNP, BMET  If you have labs (blood work) drawn today and your tests are completely normal, you will receive your results only by: Marland Kitchen MyChart Message (if you have MyChart) OR . A paper copy in the mail If you have any lab test that is abnormal or we need to change your treatment, we will call you to review the results.   Testing/Procedures: Your EKG today showed your pacemaker was functioning.   Follow-Up: At [facility], you and your health needs are our priority.  As part of our continuing mission to provide you with exceptional heart care, we have created designated Provider Care Teams.  These Care Teams include your primary Cardiologist (physician) and Advanced Practice Providers (APPs -  Physician Assistants and Nurse Practitioners) who all work together to provide you with the care you need, when you need it.  We recommend signing up for the patient portal called "MyChart".  Sign up information is provided on this After Visit Summary.  MyChart is used to connect with patients for Virtual Visits (Telemedicine).  Patients are able to view lab/test results, encounter notes, upcoming appointments, etc.  Non-urgent messages can be sent to your provider as well.   To learn more about what you can do with MyChart, go to [email or web address].    Your next appointment:  In July as previously scheduled with Dr. Rockey Situ  Recommend you talk with your primary care provider (Dr. Kary Kos) about the pain and weakness in your legs. It would likely be beneficial to see orthopedics or physical therapy.

## 2020-06-08 ENCOUNTER — Telehealth: Payer: Self-pay | Admitting: *Deleted

## 2020-06-08 NOTE — Telephone Encounter (Signed)
Tried calling patient's wife. No answer. Left message to call back.   Called and s/w patient. He said he could [facility] work.

## 2020-06-08 NOTE — Telephone Encounter (Signed)
-----   Message from Loel Dubonnet, NP sent at 06/07/2020  9:01 PM EDT ----- [facility] BM[facility]/20. Can we please call him to ask him to get it? He was due for repeat labs 1 week from originally transitioning his diuretic and we are now 4 weeks out. Concern for some cognitive impairment/memory issue (he is seeing neurology) - we may need to discuss with his wife.

## 2020-06-09 NOTE — Telephone Encounter (Signed)
Can we please reach out to him/his wife again? He did not go get labs yesterday as asked.   Loel Dubonnet, NP

## 2020-06-10 ENCOUNTER — Ambulatory Visit: Admit: 2020-06-10 | Payer: MEDICARE

## 2020-06-10 NOTE — Telephone Encounter (Signed)
I attempted to call the patient on his cell number. No answer/ no voice mail after multiple rings.  I attempted to call the patient's wife, Kermit Balo (ok per DPR).  No answer/ I left a message on her voice mail to please ask the patient to go today/ tomorrow for his lab work. I advised this needed to be followed up on due to medication adjustments that had been made. I asked that she call back to confirm she had received this message.
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
## 2020-06-15 ENCOUNTER — Other Ambulatory Visit
Admission: RE | Admit: 2020-06-15 | Discharge: 2020-06-15 | Disposition: A | Discharge status: Home / Self Care | Payer: Medicare Other | Attending: Physician Assistant | Admitting: Physician Assistant

## 2020-06-15 DIAGNOSIS — I25118 Atherosclerotic heart disease of native coronary artery with other forms of angina pectoris: Secondary | ICD-10-CM | POA: Diagnosis present

## 2020-06-15 LAB — BASIC METABOLIC PANEL
Anion gap: 11 (ref 5–15)
BUN: 40 mg/dL — ABNORMAL HIGH (ref 8–23)
CO2: 26 mmol/L (ref 22–32)
Calcium: 9 mg/dL (ref 8.9–10.3)
Chloride: 101 mmol/L (ref 98–111)
Creatinine, Ser: 2.45 mg/dL — ABNORMAL HIGH (ref 0.61–1.24)
GFR calc Af Amer: 29 mL/min — ABNORMAL LOW (ref >60)
GFR calc non Af Amer: 25 mL/min — ABNORMAL LOW (ref >60)
Glucose, Bld: 119 mg/dL — ABNORMAL HIGH (ref 70–99)
Potassium: 4.1 mmol/L (ref 3.5–5.1)
Sodium: 138 mmol/L (ref 135–145)

## 2020-06-15 LAB — BRAIN NATRIURETIC PEPTIDE: B Natriuretic Peptide: 114.5 pg/mL — ABNORMAL HIGH (ref 0.0–100.0)

## 2020-06-15 NOTE — Telephone Encounter (Signed)
I spoke with the patient about coming in to have his lab work done today per Laurann Montana, NP ( BMP/ BNP).  The patient states he will try to come today for this.  I have advised him to please come to the [place] entrance at [facility]- 1st desk on the right to check in.  The patient voices understanding.

## 2020-06-16 NOTE — Telephone Encounter (Signed)
Patient did have labs drawn on 06/15/20 as requested. Closing this encounter.

## 2020-07-05 ENCOUNTER — Ambulatory Visit (INDEPENDENT_AMBULATORY_CARE_PROVIDER_SITE_OTHER): Payer: Medicare Other

## 2020-07-05 DIAGNOSIS — I5032 Chronic diastolic (congestive) heart failure: Secondary | ICD-10-CM | POA: Diagnosis not present

## 2020-07-05 DIAGNOSIS — Z950 Presence of cardiac pacemaker: Secondary | ICD-10-CM

## 2020-07-05 NOTE — Progress Notes (Signed)
EPIC Encounter for ICM Monitoring  Patient Name: Jesus Hendrix is a 74 y.o. male Date: 07/05/2020 Primary Care Physican: Jesus Pink, MD Primary Cardiologist:Jesus Hendrix Electrophysiologist:Jesus Hendrix 06/07/2020 OfficeWeight:233lbs     Transmission reviewed. Patient has OV with Jesus Hendrix on 07/06/2020  CorVueThoracic impedancesuggesting possible fluid accumulation since 06/30/2020.  Impedance also suggesting possible fluid accumulation from 7/3-7/9  Prescribed:Furosemide40 mg take 1 tablet by mouthdaily.  Labs: 01/09/2020 Creatinine 2.61, BUN 48, Potassium 4.5, Sodium 137, GFR 23-27 08/28/2019 Creatinine2.54, BUN48, Potassium4.3, Sodium139, FVC94-49 07/10/2019 Creatinine2.31, BUN41, Potassium4.4, Sodium140, QPR91-63 A complete set of results can be found in Results Review  Recommendations: Any recommendations will be given at 07/06/2020 OV with Jesus Hendrix  Follow-up plan: ICM clinic phone appointment on7/27/2021 to recheck fluid levels 91 day device clinic remote transmission8/31/2021.Office visit with Jesus Hendrix 07/06/2020.  EP/Cardiology Office Visits: 07/06/2020 with Jesus. [name].    Copy of ICM check sent to Jesus. Rayann Hendrix and Jesus Hendrix.   3 month ICM trend: 07/05/2020    1 Year ICM trend:       Jesus Billings, RN 07/05/2020 3:04 PM

## 2020-07-05 NOTE — Progress Notes (Signed)
[name], male   DOB: 06/04/1946, 74 y.o.   MRN: [id number] Cardiology Office Note  Date:  07/06/2020   ID:  Jesus Hendrix, Jesus Hendrix December 06, 1946, MRN [id number]  PCP:  Maryland Pink, MD   Chief Complaint  Patient presents with  . other    6 month follow up. Meds reviewed by the pt. verbally. Pt. c/o shortness of breath.     HPI:  Jesus Hendrix is a 74 year old gentleman with  Persistent atrial fibrillation chronic back pain and sciatica down his left leg,  coronary artery disease and bypass surgery in 2007,  hyperlipidemia,  obesity,  kidney cancer,  status post laser ablation at [facility], ANCA associated vasculitis, Wegener's granulomatosis,  previous history of dizziness which he attributes to his Wegener's followed by [facility] nephrology -Medication noncompliance EF 35%, pulm HTN who presents for routine followup of his coronary artery disease, atrial fibrillation, heart block with pacer, Wegener's, hemoptysis, blood loss  Long history of physical labor Long hours on a backhoe, 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 Does not like to sit around Plans to continue working for as long as he can Has chronic back pain, worse if he does not stay active  Coughing every 20 to 30 min, has been a chronic issue "looks like slot" Reports having this on his last clinic visit No hemoptysis Followed at [facility] nephrology  Denies any leg swelling Takes diuretic daily  Does self-catheterization 3 times daily Gets a fullness bloating in his lower abdomen before catheterization  Chronic shortness of breath,  Prior echocardiogram reviewed with him showing moderate aortic valve stenosis mean gradient 20 mmHg  EKG personally reviewed by myself on todays visit Shows paced rhythm underlying atrial fibrillation rate 60 bpm  Other past medical history reviewed heart rate in the 30s, transferred to [facility], Pacerplacedforcomplete heart block switch to eliquis  Chronic severe back pain, walking  with a limp Till working, digging, foundations and sewage Followed by Dr. Carloyn Manner, Neurosurgery  Followed by nephrology at [facility] upper tract urothelial cancer s/p right percutaneous resection of renal pelvic tumor on 05/28/13, fulguration of renal and ureteral tumor 09/29/14, right renal tumor fulguration 05/11/15, and right ureteroscopic tumor ablation on 02/26/17. Known atrophic left kidney.  cardiac catheterization in January 2007 details 75-80% long lesion in the LAD at the ostium, 95% lesion in the PDA. He was referred to bypass surgery. Previous echocardiogram January 2009 showed mild MR, normal LV function, aortic valve sclerosis without significant stenosis  PMH:   has a past medical history of Anxiety, Aortic stenosis, AV block, Mobitz 2, Choledocholithiasis, Chronic systolic CHF (congestive heart failure) (HCC), CKD (chronic kidney disease), stage IV ([place]), COPD (chronic obstructive pulmonary disease) ([place]), Coronary artery disease, Exogenous obesity, GERD (gastroesophageal reflux disease), Gout, Hiatal hernia, Hip pain, chronic (08/03/2016), History of colonic diverticulitis, History of renal cell carcinoma, Hyperlipidemia, Hypertension, Moderate aortic stenosis, Near syncope, Nephrolithiasis, Panic attacks, Permanent atrial fibrillation ([place]) (06/06/2016), Spinal stenosis, Temporomandibular joint pain dysfunction syndrome, Umbilical hernia, Vitamin D deficiency, and Wegener's granulomatosis with renal involvement ([place]).  PSH:    Past Surgical History:  Procedure Laterality Date  . CARDIAC CATHETERIZATION    . CHOLECYSTECTOMY    . COLONOSCOPY N/A 06/03/2015   Procedure: COLONOSCOPY;  Surgeon: Lucilla Lame, MD;  Location: [place];  Service: Gastroenterology;  Laterality: N/A;  . CORONARY ARTERY BYPASS GRAFT  2004   CABG x 3  . EYE SURGERY    . HERNIA REPAIR     x 2  .  HIP SURGERY     right hip replacement  . INTRAOCULAR LENS INSERTION    . KIDNEY SURGERY     cancer  . NOSE  SURGERY    . PACEMAKER IMPLANT N/A 04/05/2018   SJM Biventricular pacemaker implanted by Dr Rayann Heman for second degree AV block, EF < 50% and permanent afib  . POLYPECTOMY  06/03/2015   Procedure: POLYPECTOMY;  Surgeon: Lucilla Lame, MD;  Location: [place];  Service: Gastroenterology;;  . PROSTATE SURGERY    . TOTAL HIP ARTHROPLASTY Left 12/19/2016   Procedure: LEFT TOTAL HIP ARTHROPLASTY;  Surgeon: Garald Balding, MD;  Location: [place];  Service: Orthopedics;  Laterality: Left;    Current Outpatient Medications  Medication Sig Dispense Refill  . acetaminophen (TYLENOL) 325 MG tablet Take 1 tablet by mouth as needed for pain.    Marland Kitchen ALPRAZolam (XANAX) 0.5 MG tablet Take 0.5 mg by mouth at bedtime.    Marland Kitchen apixaban (ELIQUIS) 5 MG TABS tablet Take 1 tablet (5 mg total) by mouth 2 (two) times daily. 60 tablet 11  . atorvastatin (LIPITOR) 20 MG tablet Take 20 mg by mouth daily.    . cetirizine (ZYRTEC) 10 MG tablet Take 1 tablet (10 mg total) by mouth daily. 30 tablet 3  . Cholecalciferol (VITAMIN D) 2000 units CAPS Take 2,000 Units by mouth at bedtime.     . clonazePAM (KLONOPIN) 1 MG tablet Take 1 tablet by mouth as needed for sleep.    . DONEPEZIL HCL PO Take 5 mg by mouth daily.    . famotidine (PEPCID) 20 MG tablet TAKE 1 TABLET(20 MG) BY MOUTH EVERY DAY AT BEDTIME AS NEEDED FOR HEART BURN 90 tablet 0  . FLUoxetine (PROZAC) 10 MG capsule Take 10 mg by mouth daily.    . furosemide (LASIX) 20 MG tablet Take 20 mg by mouth daily.     Marland Kitchen losartan (COZAAR) 25 MG tablet Take 1 tablet (25 mg total) by mouth daily. 90 tablet 3  . pantoprazole (PROTONIX) 40 MG tablet Take 1 tablet (40 mg) by mouth once daily before supper    . tamsulosin (FLOMAX) 0.4 MG CAPS capsule Take 0.4 mg by mouth daily.    Marland Kitchen torsemide (DEMADEX) 20 MG tablet Take 1 tablet (20 mg total) by mouth 2 (two) times daily. (Patient taking differently: Take 20 mg by mouth daily. ) 60 tablet 2   No current facility-administered  medications for this visit.     Allergies:   Clonazepam   Social History:  The patient  reports that he quit smoking about 45 years ago. His smoking use included cigarettes. He has a 12.50 pack-year smoking history. He has never used smokeless tobacco. He reports that he does not drink alcohol and does not use drugs.   Family History:   family history includes Heart disease in his brother, mother, and sister.    Review of Systems: Review of Systems  Constitutional: Negative.   HENT: Negative.   Respiratory: Positive for shortness of breath.   Cardiovascular: Negative.   Gastrointestinal: Negative.   Musculoskeletal: Positive for back pain.  Neurological: Negative.   All other systems reviewed and are negative.   PHYSICAL EXAM: VS:  BP 130/60 (BP Location: Left Arm, Patient Position: Sitting, Cuff Size: Normal)   Pulse 60   Ht 5\' 8"  (1.727 m)   Wt 237 lb (107.5 kg)   BMI 36.04 kg/m  , BMI Body mass index is 36.04 kg/m. Constitutional:  oriented to person, place,  and time. No distress.  HENT:  Head: Normocephalic and atraumatic.  Eyes:  no discharge. No scleral icterus.  Neck: Normal range of motion. Neck supple. No JVD present.  Cardiovascular: Normal rate, regular rhythm, normal heart sounds and intact distal pulses. Exam reveals no gallop and no friction rub. No edema 2/6 to 3/6 systolic ejection murmur appreciated right sternal border Pulmonary/Chest: Effort normal and breath sounds normal. No stridor. No respiratory distress.  no wheezes.  no rales.  no tenderness.  Abdominal: Soft.  no distension.  no tenderness.  Musculoskeletal: Normal range of motion.  no  tenderness or deformity.  Neurological:  normal muscle tone. Coordination normal. No atrophy Skin: Skin is warm and dry. No rash noted. not diaphoretic.  Psychiatric:  normal mood and affect. behavior is normal. Thought content normal.   Recent Labs: 07/10/2019: ALT 33 05/04/2020: Hemoglobin 11.6; NT-Pro BNP  2,585; Platelets 251 06/15/2020: B Natriuretic Peptide 114.5; BUN 40; Creatinine, Ser 2.45; Potassium 4.1; Sodium 138    Lipid Panel Lab Results  Component Value Date   CHOL 132 04/05/2018   HDL 29 (L) 04/05/2018   LDLCALC 91 04/05/2018   TRIG 58 04/05/2018      Wt Readings from Last 3 Encounters:  07/06/20 237 lb (107.5 kg)  06/07/20 233 lb (105.7 kg)  05/04/20 241 lb 4 oz (109.4 kg)      ASSESSMENT AND PLAN:  Complete heart block Paced rhythm Followed by EP, stable  Wegener's/hemoptysis Close follow-up with [facility] No hemoptysis, reports symptoms are stable Compliance with his medications Previously on dapsone, CellCept prednisone Notes from [facility] reviewed  Anemia Stable numbers, tolerating Eliquis  Coronary artery disease involving native coronary artery of native heart with stable angina pectoris -  Currently with no symptoms of angina. No further workup at this time. Continue current medication regimen.  Shortness of breath Repeat echocardiogram ordered, long discussion concerning aortic valve stenosis Moderate stenosis August 2020 Discussed potential need in the future for work-up including catheterization and valve disease surgery  Moderate aortic valve stenosis Likely contributing to some of his shortness of breath, repeat echocardiogram has been ordered Given prior sternotomy, in the future would probably need TAVR  Persistent atrial fibrillation (HCC) - Rate controlled, paced rhythm, on Eliquis Maintain on diuretic daily  Essential hypertension Blood pressure is well controlled on today's visit. No changes made to the medications.  Hyperlipidemia Cholesterol at goal, continue statin  S/P CABG x 3 Denies anginal symptoms but does have some shortness of breath, chronic issue Echo pending  Chronic low back pain Long history working with cement, [place], sewage work   Total encounter time more than 25 minutes  Greater than 50% was spent in counseling  and coordination of care with the patient   Orders Placed This Encounter  Procedures  . EKG 12-Lead     Signed, Esmond Plants, M.D., Ph.D. 07/06/2020  [place], [place]

## 2020-07-06 ENCOUNTER — Encounter: Payer: Self-pay | Admitting: Cardiovascular Disease

## 2020-07-06 ENCOUNTER — Telehealth: Payer: Self-pay | Admitting: Cardiovascular Disease

## 2020-07-06 ENCOUNTER — Ambulatory Visit: Payer: Medicare Other | Admitting: Cardiovascular Disease

## 2020-07-06 ENCOUNTER — Other Ambulatory Visit: Payer: Self-pay

## 2020-07-06 VITALS — BP 130/60 | HR 60 | Ht 68.0 in | Wt 237.0 lb

## 2020-07-06 DIAGNOSIS — N184 Chronic kidney disease, stage 4 (severe): Secondary | ICD-10-CM

## 2020-07-06 DIAGNOSIS — I25118 Atherosclerotic heart disease of native coronary artery with other forms of angina pectoris: Secondary | ICD-10-CM | POA: Diagnosis not present

## 2020-07-06 DIAGNOSIS — I10 Essential (primary) hypertension: Secondary | ICD-10-CM

## 2020-07-06 DIAGNOSIS — I4821 Permanent atrial fibrillation: Secondary | ICD-10-CM

## 2020-07-06 DIAGNOSIS — I5032 Chronic diastolic (congestive) heart failure: Secondary | ICD-10-CM

## 2020-07-06 DIAGNOSIS — I5042 Chronic combined systolic (congestive) and diastolic (congestive) heart failure: Secondary | ICD-10-CM

## 2020-07-06 DIAGNOSIS — R4189 Other symptoms and signs involving cognitive functions and awareness: Secondary | ICD-10-CM

## 2020-07-06 DIAGNOSIS — Z950 Presence of cardiac pacemaker: Secondary | ICD-10-CM | POA: Diagnosis not present

## 2020-07-06 DIAGNOSIS — I350 Nonrheumatic aortic (valve) stenosis: Secondary | ICD-10-CM

## 2020-07-06 NOTE — Telephone Encounter (Signed)
Spoke with patients wife per release form and confirmed medications with her and she states they have changed it so many times. She states he is taking torsemide 20 mg twice a day and not the furosemide. Advised I would update Dr. Rockey Situ and she was appreciative for the call. She had no further questions at this time.

## 2020-07-06 NOTE — Telephone Encounter (Signed)
Left voicemail message for patients wife to call back. I need to see which medication he is taking Furosemide or Torsemide.

## 2020-07-06 NOTE — Patient Instructions (Addendum)
Echo for cardiomyopathy, and aortic valve stenosis  Medication Instructions:  No changes  If you need a refill on your cardiac medications before your next appointment, please call your pharmacy.    Lab work: No new labs needed   If you have labs (blood work) drawn today and your tests are completely normal, you will receive your results only by: Marland Kitchen MyChart Message (if you have MyChart) OR . A paper copy in the mail If you have any lab test that is abnormal or we need to change your treatment, we will call you to review the results.   Testing/Procedures: Your physician has requested that you have an echocardiogram. Echocardiography is a painless test that uses sound waves to create images of your heart. It provides your doctor with information about the size and shape of your heart and how well your heart's chambers and valves are working. This procedure takes approximately one hour. There are no restrictions for this procedure.    Follow-Up: At [facility], you and your health needs are our priority.  As part of our continuing mission to provide you with exceptional heart care, we have created designated Provider Care Teams.  These Care Teams include your primary Cardiologist (physician) and Advanced Practice Providers (APPs -  Physician Assistants and Nurse Practitioners) who all work together to provide you with the care you need, when you need it.  . You will need a follow up appointment in 6 months   . Providers on your designated Care Team:   . Murray Hodgkins, NP . Christell Faith, PA-C . Marrianne Mood, PA-C  Any Other Special Instructions Will Be Listed Below (If Applicable).  For educational health videos Log in to : [email or web address] Or : [email or web address], password : [email or web address]

## 2020-07-08 ENCOUNTER — Telehealth: Payer: Self-pay | Admitting: Cardiovascular Disease

## 2020-07-08 ENCOUNTER — Other Ambulatory Visit: Payer: Self-pay | Admitting: Family

## 2020-07-08 NOTE — Telephone Encounter (Signed)
Please advise if ok to refill Pepcid 20 mg tablet prn. Last filled by [facility].

## 2020-07-08 NOTE — Telephone Encounter (Signed)
If we can confirm Lasix is off his list and he has refills on his torsemide

## 2020-07-09 NOTE — Telephone Encounter (Signed)
Do you want to continue to fill pepcid 20 mg daily as needed for this patient?

## 2020-07-09 NOTE — Telephone Encounter (Signed)
We can fill it if he would like but it can be bought over-the-counter, generic

## 2020-07-12 NOTE — Telephone Encounter (Signed)
Confirmed that he is not taking Lasix. I made him aware that according to chart, he has 3 refills (90 days) on his Torsemide.  Pt appreciative of call.   Advised pt to call for any further questions or concerns.

## 2020-07-13 ENCOUNTER — Ambulatory Visit (INDEPENDENT_AMBULATORY_CARE_PROVIDER_SITE_OTHER): Payer: Medicare Other

## 2020-07-13 DIAGNOSIS — I5032 Chronic diastolic (congestive) heart failure: Secondary | ICD-10-CM

## 2020-07-13 DIAGNOSIS — Z950 Presence of cardiac pacemaker: Secondary | ICD-10-CM

## 2020-07-16 NOTE — Progress Notes (Signed)
EPIC Encounter for ICM Monitoring  Patient Name: Jesus Hendrix is a 74 y.o. male Date: 07/16/2020 Primary Care Physican: Maryland Pink, MD Primary Cardiologist:Gollan Electrophysiologist:Allred 07/06/2020 OfficeWeight:237lbs     Transmission reviewed.   CorVueThoracic impedancereturned to normal since 07/05/2020 remote transmission.  Prescribed:Torsemide20 mg take 1 tablet twice a day.  Labs: 06/15/2020 Creatinine 2.45, BUN 40, Potassium 4.1, Sodium 138, GFR 25-29 05/04/2020 Creatinine 2033, BUN 45, Potassium 5.4, Sodium 142, GFR 26-30  A complete set of results can be found in Results Review.  Recommendations: No changes  Follow-up plan: ICM clinic phone appointment on 08/09/2020. 91 day device clinic remote transmission8/31/2021.    EP/Cardiology Office Visits: 01/11/2021 with Dr. Rockey Situ.  Recall for 08/17/2020 with Oda Kilts, PA   Copy of ICM check sent to Dr. Rayann Heman.   3 month ICM trend: 07/13/2020    1 Year ICM trend:       [name], RN 07/16/2020 4:07 PM

## 2020-08-05 ENCOUNTER — Other Ambulatory Visit: Payer: Medicare Other

## 2020-08-09 ENCOUNTER — Ambulatory Visit (INDEPENDENT_AMBULATORY_CARE_PROVIDER_SITE_OTHER): Payer: Medicare Other

## 2020-08-09 DIAGNOSIS — Z950 Presence of cardiac pacemaker: Secondary | ICD-10-CM | POA: Diagnosis not present

## 2020-08-09 DIAGNOSIS — I5032 Chronic diastolic (congestive) heart failure: Secondary | ICD-10-CM

## 2020-08-11 NOTE — Progress Notes (Signed)
EPIC Encounter for ICM Monitoring  Patient Name: Jesus Hendrix is a 74 y.o. male Date: 08/11/2020 Primary Care Physican: Maryland Pink, MD Primary Cardiologist:Gollan Electrophysiologist:Allred 07/06/2020 OfficeWeight:237lbs     Transmission reviewed.   CorVueThoracic impedancenormal  Prescribed:Torsemide20 mg take 1 tablet twice a day.  Labs: 06/15/2020 Creatinine 2.45, BUN 40, Potassium 4.1, Sodium 138, GFR 25-29 05/04/2020 Creatinine 2033, BUN 45, Potassium 5.4, Sodium 142, GFR 26-30  A complete set of results can be found in Results Review.  Recommendations: No changes  Follow-up plan: ICM clinic phone appointment on 09/13/2020.91 day device clinic remote transmission8/31/2021.    EP/Cardiology Office Visits: 01/11/2021 with Dr. Rockey Situ.  Recall for 08/17/2020 with Oda Kilts, PA   Copy of ICM check sent to Dr. Rayann Heman.   3 month ICM trend: 08/09/2020    1 Year ICM trend:       [name], RN 08/11/2020 11:05 AM

## 2020-08-17 ENCOUNTER — Ambulatory Visit (INDEPENDENT_AMBULATORY_CARE_PROVIDER_SITE_OTHER): Payer: Medicare Other | Admitting: *Deleted

## 2020-08-17 DIAGNOSIS — I5032 Chronic diastolic (congestive) heart failure: Secondary | ICD-10-CM | POA: Diagnosis not present

## 2020-08-17 DIAGNOSIS — I4821 Permanent atrial fibrillation: Secondary | ICD-10-CM

## 2020-08-21 LAB — CUP PACEART REMOTE DEVICE CHECK
Battery Remaining Longevity: 105 mo
Battery Remaining Percentage: 95.5 %
Battery Voltage: 2.99 V
Date Time Interrogation Session: 20210831020021
Implantable Lead Implant Date: 20190419
Implantable Lead Implant Date: 20190419
Implantable Lead Location: 753858
Implantable Lead Location: 753860
Implantable Pulse Generator Implant Date: 20190419
Lead Channel Impedance Value: 410 Ohm
Lead Channel Impedance Value: 760 Ohm
Lead Channel Pacing Threshold Amplitude: 0.75 V
Lead Channel Pacing Threshold Amplitude: 1 V
Lead Channel Pacing Threshold Pulse Width: 0.5 ms
Lead Channel Pacing Threshold Pulse Width: 0.5 ms
Lead Channel Sensing Intrinsic Amplitude: 12 mV
Lead Channel Setting Pacing Amplitude: 2 V
Lead Channel Setting Pacing Amplitude: 2.5 V
Lead Channel Setting Pacing Pulse Width: 0.5 ms
Lead Channel Setting Pacing Pulse Width: 0.5 ms
Lead Channel Setting Sensing Sensitivity: 4 mV
Pulse Gen Model: 3262
Pulse Gen Serial Number: 9017264

## 2020-08-26 ENCOUNTER — Other Ambulatory Visit: Payer: Medicare Other

## 2020-08-27 NOTE — Progress Notes (Signed)
Remote pacemaker transmission.   

## 2020-08-28 ENCOUNTER — Other Ambulatory Visit: Payer: Self-pay | Admitting: Family

## 2020-08-30 ENCOUNTER — Other Ambulatory Visit: Payer: Self-pay | Admitting: Family

## 2020-09-13 ENCOUNTER — Ambulatory Visit (INDEPENDENT_AMBULATORY_CARE_PROVIDER_SITE_OTHER): Payer: Medicare Other

## 2020-09-13 DIAGNOSIS — I5032 Chronic diastolic (congestive) heart failure: Secondary | ICD-10-CM

## 2020-09-13 DIAGNOSIS — Z950 Presence of cardiac pacemaker: Secondary | ICD-10-CM

## 2020-09-14 NOTE — Progress Notes (Signed)
EPIC Encounter for ICM Monitoring  Patient Name: Jesus Hendrix is a 74 y.o. male Date: 09/14/2020 Primary Care Physican: Maryland Pink, MD Primary Cardiologist:Gollan Electrophysiologist:Allred 07/06/2020 OfficeWeight:237lbs     Transmission reviewed.   CorVueThoracic impedancenormal  Prescribed:Torsemide20 mg take 1 tablettwice a day.  Labs: 06/15/2020 Creatinine2.45, BUN40, Potassium4.1, [id number], CXF07-22 05/04/2020 Creatinine2033, BUN45, Potassium5.4, [id number], [id number] A complete set of results can be found in Results Review.  Recommendations: No changes  Follow-up plan: ICM clinic phone appointment on 10/18/2020.91 day device clinic remote transmission11/30/2021.  EP/Cardiology Office Visits:1/25/2022with Dr. Rockey Situ.Recall for 08/17/2020 with Oda Kilts, PA  Copy of ICM check sent to Dr.Allred.   3 month ICM trend: 09/13/2020    1 Year ICM trend:       [name], RN 09/14/2020 5:14 PM

## 2020-09-24 ENCOUNTER — Other Ambulatory Visit: Payer: Self-pay | Admitting: Cardiovascular Disease

## 2020-09-24 NOTE — Telephone Encounter (Signed)
Rx request sent to pharmacy.  

## 2020-10-18 ENCOUNTER — Ambulatory Visit (INDEPENDENT_AMBULATORY_CARE_PROVIDER_SITE_OTHER): Payer: Medicare Other

## 2020-10-18 DIAGNOSIS — Z950 Presence of cardiac pacemaker: Secondary | ICD-10-CM

## 2020-10-18 DIAGNOSIS — I5032 Chronic diastolic (congestive) heart failure: Secondary | ICD-10-CM | POA: Diagnosis not present

## 2020-10-22 NOTE — Progress Notes (Signed)
EPIC Encounter for ICM Monitoring  Patient Name: Jesus Hendrix is a 74 y.o. male Date: 10/22/2020 Primary Care Physican: Maryland Pink, MD Primary Cardiologist:Gollan Electrophysiologist:Allred 07/06/2020 OfficeWeight:237lbs     Transmission reviewed.   CorVueThoracic impedancenormal  Prescribed:Torsemide20 mg take 1 tablettwice a day.  Labs: 06/15/2020 Creatinine2.45, BUN40, Potassium4.1, [id number], TRZ73-56 05/04/2020 Creatinine2033, BUN45, Potassium5.4, POLIDC301, [id number] A complete set of results can be found in Results Review.  Recommendations: No changes  Follow-up plan: ICM clinic phone appointment on 11/23/2020.91 day device clinic remote transmission11/30/2021.  EP/Cardiology Office Visits:1/25/2022with Dr. Rockey Situ.Recall for 08/17/2020 with Oda Kilts, PA  Copy of ICM check sent to Dr.Allred.   3 month ICM trend: 10/18/2020    1 Year ICM trend:       [name], RN 10/22/2020 4:12 PM

## 2020-11-16 ENCOUNTER — Ambulatory Visit (INDEPENDENT_AMBULATORY_CARE_PROVIDER_SITE_OTHER): Payer: Medicare Other

## 2020-11-16 DIAGNOSIS — I4821 Permanent atrial fibrillation: Secondary | ICD-10-CM

## 2020-11-16 LAB — CUP PACEART REMOTE DEVICE CHECK
Battery Remaining Longevity: 106 mo
Battery Remaining Percentage: 95.5 %
Battery Voltage: 2.99 V
Date Time Interrogation Session: 20211130045907
Implantable Lead Implant Date: 20190419
Implantable Lead Implant Date: 20190419
Implantable Lead Location: 753858
Implantable Lead Location: 753860
Implantable Pulse Generator Implant Date: 20190419
Lead Channel Impedance Value: 430 Ohm
Lead Channel Impedance Value: 790 Ohm
Lead Channel Pacing Threshold Amplitude: 0.75 V
Lead Channel Pacing Threshold Amplitude: 1 V
Lead Channel Pacing Threshold Pulse Width: 0.5 ms
Lead Channel Pacing Threshold Pulse Width: 0.5 ms
Lead Channel Sensing Intrinsic Amplitude: 12 mV
Lead Channel Setting Pacing Amplitude: 2 V
Lead Channel Setting Pacing Amplitude: 2.5 V
Lead Channel Setting Pacing Pulse Width: 0.5 ms
Lead Channel Setting Pacing Pulse Width: 0.5 ms
Lead Channel Setting Sensing Sensitivity: 4 mV
Pulse Gen Model: 3262
Pulse Gen Serial Number: 9017264

## 2020-11-22 NOTE — Progress Notes (Signed)
Remote pacemaker transmission.   

## 2020-11-23 ENCOUNTER — Ambulatory Visit (INDEPENDENT_AMBULATORY_CARE_PROVIDER_SITE_OTHER): Payer: Medicare Other

## 2020-11-23 DIAGNOSIS — I5032 Chronic diastolic (congestive) heart failure: Secondary | ICD-10-CM | POA: Diagnosis not present

## 2020-11-23 DIAGNOSIS — Z950 Presence of cardiac pacemaker: Secondary | ICD-10-CM

## 2020-11-26 NOTE — Progress Notes (Signed)
EPIC Encounter for ICM Monitoring  Patient Name: Jesus Hendrix is a 74 y.o. male Date: 11/26/2020 Primary Care Physican: Maryland Pink, MD Primary Cardiologist:Gollan Electrophysiologist:Allred Last OfficeWeight:237lbs     Transmission reviewed.   CorVueThoracic impedancenormal  Prescribed:Torsemide20 mg take 1 tablettwice a day.  Labs: 06/15/2020 Creatinine2.45, BUN40, Potassium4.1, [id number], PNP00-51 05/04/2020 Creatinine2033, BUN45, Potassium5.4, [id number], [id number] A complete set of results can be found in Results Review.  Recommendations: No changes  Follow-up plan: ICM clinic phone appointment on 12/28/2020.91 day device clinic remote transmission3/12/2020.  EP/Cardiology Office Visits:1/25/2022with Dr. Rockey Situ.Recall for 08/17/2020 with Oda Kilts, PA  Copy of ICM check sent to Dr.Allred.   3 month ICM trend: 11/23/2020    1 Year ICM trend:       Jesus Billings, RN 11/26/2020 10:37 AM

## 2020-12-05 ENCOUNTER — Other Ambulatory Visit: Payer: Self-pay | Admitting: Family

## 2020-12-28 ENCOUNTER — Ambulatory Visit (INDEPENDENT_AMBULATORY_CARE_PROVIDER_SITE_OTHER): Payer: Medicare Other

## 2020-12-28 DIAGNOSIS — Z950 Presence of cardiac pacemaker: Secondary | ICD-10-CM

## 2020-12-28 DIAGNOSIS — I5032 Chronic diastolic (congestive) heart failure: Secondary | ICD-10-CM

## 2020-12-31 NOTE — Progress Notes (Signed)
EPIC Encounter for ICM Monitoring  Patient Name: Jesus Hendrix is a 75 y.o. male Date: 12/31/2020 Primary Care Physican: Maryland Pink, MD Primary Cardiologist:Gollan Electrophysiologist:Allred 12/28/2020 OfficeWeight:231lbs     Transmission reviewed.   CorVueThoracic impedancenormal  Prescribed:Torsemide20 mg take 1 tablettwice a day.  Labs: 06/15/2020 Creatinine2.45, BUN40, Potassium4.1, [id number], [id number] 05/04/2020 Creatinine2033, BUN45, Potassium5.4, ERDEYC144, [id number] A complete set of results can be found in Results Review.  Recommendations: No changes  Follow-up plan: ICM clinic phone appointment on2/21/2022.91 day device clinic remote transmission3/12/2020.  EP/Cardiology Office Visits:1/25/2022with Dr. Rockey Situ.Recall for 08/17/2020 with Oda Kilts, PA  Copy of ICM check sent to Dr.Allred.  3 month ICM trend: 12/27/2020.    1 Year ICM trend:       [name], RN 12/31/2020 10:02 AM

## 2021-01-08 ENCOUNTER — Other Ambulatory Visit: Payer: Self-pay | Admitting: Cardiovascular Disease

## 2021-01-10 NOTE — Progress Notes (Signed)
Patient ID: LAVIN PETTEWAY, male   DOB: 1946-06-13, 75 y.o.   MRN: [id number] Cardiology Office Note  Date:  01/11/2021   ID:  Jesus Hendrix, Jesus Hendrix Jan 20, 1946, MRN [id number]  PCP:  Maryland Pink, MD   Chief Complaint  Patient presents with  . OTHER    C/o bilateral leg and ankle swelling for almost a month. Medications verbally reviewed with patient and wife.     HPI:  Jesus Hendrix is a 75 year old gentleman with  Persistent atrial fibrillation chronic back pain and sciatica down his left leg,  coronary artery disease and bypass surgery in 2007,  hyperlipidemia,  obesity,  kidney cancer,  status post laser ablation at [facility], ANCA associated vasculitis, Wegener's granulomatosis,  previous history of dizziness which he attributes to his Wegener's followed by [facility] nephrology -Medication noncompliance EF 35%, pulm HTN who presents for routine followup of his coronary artery disease, atrial fibrillation, heart block with pacer, Wegener's, hemoptysis, blood loss  Presents today with his wife She is concerned about his breathing, leg swelling that comes and goes, poor balance, sedentary lifestyle  Not very active but when he walks too fast he gets some shortness of breath Had recent swelling but today it is resolved Poor balance, legs weak No recent falls  Off all wegeners medications Has not had recent follow-up with specialist at [facility]  Leg swelling may be related to not doing his self catheterizations, wife is concerned For a while was not doing self cath, would get leg swelling Leg swelling better with self cath  No recent lab work available  He is depressed, 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 This defect is fixed and likely represent a previous infarct.  This is an intermediate risk study mainly due to reduced EF.  Nuclear stress EF: 37%.  No reversible ischemia is noted.   Prior echocardiogram reviewed with him showing moderate aortic valve stenosis mean gradient 20 mmHg  EKG personally reviewed by myself on todays visit Shows paced rhythm underlying atrial fibrillation rate 60 bpm  Other past medical history reviewed heart rate in the 30s, transferred to [facility], Pacerplacedforcomplete heart block switch to eliquis  Chronic severe back pain, walking with a limp Till working, digging, foundations and sewage Followed by Dr. 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 Known atrophic left kidney.  cardiac catheterization in January 2007 details 75-80% long lesion in the LAD at the ostium, 95% lesion in the PDA. He was referred to bypass surgery. Previous echocardiogram January 2009 showed mild MR, normal LV function, aortic valve sclerosis without significant stenosis  PMH:   has a past medical history of Anxiety, Aortic stenosis, AV block, Mobitz 2, Choledocholithiasis, Chronic systolic CHF (congestive heart failure) (HCC), CKD (chronic kidney disease), stage IV (HCC), COPD (chronic obstructive pulmonary disease) ([place]), Coronary artery disease, Exogenous obesity, GERD (gastroesophageal reflux disease), Gout, Hiatal hernia, Hip pain, chronic (08/03/2016), History of colonic diverticulitis, History of renal cell carcinoma, Hyperlipidemia, Hypertension, Moderate aortic stenosis, Near syncope, Nephrolithiasis, Panic attacks,  Permanent atrial fibrillation ([place]) (06/06/2016), Spinal stenosis, Temporomandibular joint pain  dysfunction syndrome, Umbilical hernia, Vitamin D deficiency, and Wegener's granulomatosis with renal involvement ([place]).  PSH:    Past Surgical History:  Procedure Laterality Date  . CARDIAC CATHETERIZATION    . CHOLECYSTECTOMY    . COLONOSCOPY N/A 06/03/2015   Procedure: COLONOSCOPY;  Surgeon: Lucilla Lame, MD;  Location: [place];  Service: Gastroenterology;  Laterality: N/A;  . CORONARY ARTERY BYPASS GRAFT  2004   CABG x 3  . EYE SURGERY    . HERNIA REPAIR     x 2  . HIP SURGERY     right hip replacement  . INTRAOCULAR LENS INSERTION    . KIDNEY SURGERY     cancer  . NOSE SURGERY    . PACEMAKER IMPLANT N/A 04/05/2018   SJM Biventricular pacemaker implanted by Dr Rayann Heman for second degree AV block, EF < 50% and permanent afib  . POLYPECTOMY  06/03/2015   Procedure: POLYPECTOMY;  Surgeon: Lucilla Lame, MD;  Location: [place];  Service: Gastroenterology;;  . PROSTATE SURGERY    . TOTAL HIP ARTHROPLASTY Left 12/19/2016   Procedure: LEFT TOTAL HIP ARTHROPLASTY;  Surgeon: Garald Balding, MD;  Location: [place];  Service: Orthopedics;  Laterality: Left;    Current Outpatient Medications  Medication Sig Dispense Refill  . acetaminophen (TYLENOL) 325 MG tablet Take 1 tablet by mouth as needed for pain.    Marland Kitchen ALPRAZolam (XANAX) 0.5 MG tablet Take 0.5 mg by mouth at bedtime.    Marland Kitchen apixaban (ELIQUIS) 5 MG TABS tablet Take 1 tablet (5 mg total) by mouth 2 (two) times daily. 60 tablet 11  . Cholecalciferol (VITAMIN D) 2000 units CAPS Take 2,000 Units by mouth at bedtime.     . clonazePAM (KLONOPIN) 1 MG tablet Take 1 tablet by mouth as needed for sleep.    . DONEPEZIL HCL PO Take 5 mg by mouth daily.    . famotidine (PEPCID) 20 MG tablet TAKE 1 TABLET(20 MG) BY MOUTH EVERY DAY AT BEDTIME AS NEEDED FOR HEART BURN 90 tablet 0  . FLUoxetine (PROZAC) 10 MG capsule Take 10 mg by mouth daily.    . fluticasone (FLONASE) 50 MCG/ACT nasal spray SHAKE LIQUID AND USE 2 SPRAYS IN EACH  NOSTRIL EVERY DAY    . furosemide (LASIX) 40 MG tablet Take 40 mg by mouth daily.    Marland Kitchen losartan (COZAAR) 25 MG tablet Take 1 tablet (25 mg total) by mouth daily. 90 tablet 0  . pantoprazole (PROTONIX) 40 MG tablet Take 1 tablet (40 mg) by mouth once daily before supper    . torsemide (DEMADEX) 20 MG tablet TAKE 1 TABLET(20 MG) BY MOUTH TWICE DAILY 180 tablet 0  . venlafaxine XR (EFFEXOR-XR) 37.5 MG 24 hr capsule Take by mouth.     No current facility-administered medications for this visit.     Allergies:   Clonazepam   Social History:  The patient  reports that he quit smoking about 46 years ago. His smoking use included cigarettes. He has a 12.50 pack-year smoking history. He has never used smokeless tobacco. He reports that he does not drink alcohol and does not use drugs.   Family History:   family history includes Heart disease in his brother, mother, and sister.    Review of Systems: Review of Systems  Constitutional: Negative.   HENT: Negative.   Respiratory: Positive for shortness of breath.   Cardiovascular: Negative.   Gastrointestinal: Negative.  Musculoskeletal: Positive for back pain.  Neurological: Negative.   All other systems reviewed and are negative.   PHYSICAL EXAM: VS:  BP 130/74 (BP Location: Left Arm, Patient Position: Sitting, Cuff Size: Normal)   Pulse 60   Ht 5\' 8"  (1.727 m)   Wt 233 lb (105.7 kg)   SpO2 95%   BMI 35.43 kg/m  , BMI Body mass index is 35.43 kg/m. Constitutional:  oriented to person, place, and time. No distress.  HENT:  Head: Grossly normal Eyes:  no discharge. No scleral icterus.  Neck: Unable to estimate, no carotid bruits  Cardiovascular: Regular rate and rhythm, no murmurs appreciated Pulmonary/Chest: Clear to auscultation bilaterally, no wheezes or rails Abdominal: Soft.  no distension.  no tenderness.  Musculoskeletal: Normal range of motion Neurological:  normal muscle tone. Coordination normal. No atrophy Skin: Skin  warm and dry Psychiatric: normal affect, pleasant   Recent Labs: 05/04/2020: Hemoglobin 11.6; NT-Pro BNP 2,585; Platelets 251 06/15/2020: B Natriuretic Peptide 114.5; BUN 40; Creatinine, Ser 2.45; Potassium 4.1; Sodium 138    Lipid Panel Lab Results  Component Value Date   CHOL 132 04/05/2018   HDL 29 (L) 04/05/2018   LDLCALC 91 04/05/2018   TRIG 58 04/05/2018      Wt Readings from Last 3 Encounters:  01/11/21 233 lb (105.7 kg)  07/06/20 237 lb (107.5 kg)  06/07/20 233 lb (105.7 kg)      ASSESSMENT AND PLAN:  Complete heart block Paced rhythm Followed by EP, recent pacer downloads reviewed  Wegener's/hemoptysis Has not had recent follow-up at [facility], recommend a scheduled follow-up No hemoptysis,  Previously on dapsone, CellCept prednisone Currently not on any medications Some shortness of breath  Anemia  tolerating Eliquis  Coronary artery disease involving native coronary artery of native heart with stable angina pectoris -  Currently with no symptoms of angina. No further workup at this time. Continue current medication regimen.  Shortness of breath Likely multifactorial including aortic valve stenosis, deconditioning, CHF, cardiomyopathy Moderate stenosis August 2020 Repeat echocardiogram has been ordered Not a good candidate for cardiac catheterization  Moderate aortic valve stenosis Repeat echocardiogram ordered  Persistent atrial fibrillation (HCC) - Rate controlled, paced rhythm, on Eliquis We will continue torsemide 20 twice daily, CMP ordered today  Essential hypertension Blood pressure is well controlled on today's visit. No changes made to the medications.  Hyperlipidemia Cholesterol at goal, continue statin  S/P CABG x 3 Denies anginal symptoms but does have some shortness of breath, chronic issue Echo pending  Chronic low back pain Chronic issue, long history of heavy labor   Total encounter time more than 35 minutes  Greater than 50%  was spent in counseling and coordination of care with the patient   Orders Placed This Encounter  Procedures  . Comprehensive metabolic panel  . EKG 12-Lead  . ECHOCARDIOGRAM COMPLETE     Signed, Esmond Plants, M.D., Ph.D. 01/11/2021  [place], [place]

## 2021-01-11 ENCOUNTER — Ambulatory Visit: Payer: Medicare Other | Admitting: Cardiovascular Disease

## 2021-01-11 ENCOUNTER — Encounter: Payer: Self-pay | Admitting: Cardiovascular Disease

## 2021-01-11 ENCOUNTER — Other Ambulatory Visit: Payer: Self-pay

## 2021-01-11 VITALS — BP 130/74 | HR 60 | Ht 68.0 in | Wt 233.0 lb

## 2021-01-11 DIAGNOSIS — I25118 Atherosclerotic heart disease of native coronary artery with other forms of angina pectoris: Secondary | ICD-10-CM | POA: Diagnosis not present

## 2021-01-11 DIAGNOSIS — I4821 Permanent atrial fibrillation: Secondary | ICD-10-CM | POA: Diagnosis not present

## 2021-01-11 DIAGNOSIS — I420 Dilated cardiomyopathy: Secondary | ICD-10-CM | POA: Diagnosis not present

## 2021-01-11 DIAGNOSIS — R0602 Shortness of breath: Secondary | ICD-10-CM

## 2021-01-11 DIAGNOSIS — I5032 Chronic diastolic (congestive) heart failure: Secondary | ICD-10-CM | POA: Diagnosis not present

## 2021-01-11 DIAGNOSIS — I442 Atrioventricular block, complete: Secondary | ICD-10-CM

## 2021-01-11 DIAGNOSIS — I10 Essential (primary) hypertension: Secondary | ICD-10-CM

## 2021-01-11 DIAGNOSIS — N184 Chronic kidney disease, stage 4 (severe): Secondary | ICD-10-CM

## 2021-01-11 DIAGNOSIS — Z950 Presence of cardiac pacemaker: Secondary | ICD-10-CM

## 2021-01-11 DIAGNOSIS — I350 Nonrheumatic aortic (valve) stenosis: Secondary | ICD-10-CM

## 2021-01-11 NOTE — Patient Instructions (Addendum)
Medication Instructions:  No changes  If you need a refill on your cardiac medications before your next appointment, please call your pharmacy.    Lab work: [employer] Your labs (blood work) drawn today and your tests are completely normal, you will receive your results only by: Marland Kitchen MyChart Message (if you have MyChart) OR . A paper copy in the mail If you have any lab test that is abnormal or we need to change your treatment, we will call you to review the results.   Testing/Procedures: Echo for dilated cardiomyopathy, aortic valve stenosis Your physician has requested that you have an echocardiogram. Echocardiography is a painless test that uses sound waves to create images of your heart. It provides your doctor with information about the size and shape of your heart and how well your heart's chambers and valves are working. This procedure takes approximately one hour. There are no restrictions for this procedure.  There is a possibility that an IV may need to be started during your test to inject an image enhancing agent. This is done to obtain more optimal pictures of your heart. Therefore we ask that you do at least drink some water prior to coming in to hydrate your veins.    Follow-Up: At [facility], you and your health needs are our priority.  As part of our continuing mission to provide you with exceptional heart care, we have created designated Provider Care Teams.  These Care Teams include your primary Cardiologist (physician) and Advanced Practice Providers (APPs -  Physician Assistants and Nurse Practitioners) who all work together to provide you with the care you need, when you need it.  . You will need a follow up appointment in 6 months  . Providers on your designated Care Team:   . Murray Hodgkins, NP . Christell Faith, PA-C . Marrianne Mood, PA-C  Any Other Special Instructions Will Be Listed Below (If Applicable).  COVID-19 Vaccine Information can be found at:  ShippingScam.co.uk For questions related to vaccine distribution or appointments, please email [email or web address] or call [phone].

## 2021-01-12 LAB — COMPREHENSIVE METABOLIC PANEL
ALT: 33 IU/L (ref 0–44)
AST: 30 IU/L (ref 0–40)
Albumin/Globulin Ratio: 1.5 (ref 1.2–2.2)
Albumin: 4.2 g/dL (ref 3.7–4.7)
Alkaline Phosphatase: 69 IU/L (ref 44–121)
BUN/Creatinine Ratio: 18 (ref 10–24)
BUN: 39 mg/dL — ABNORMAL HIGH (ref 8–27)
Bilirubin Total: 0.4 mg/dL (ref 0.0–1.2)
CO2: 25 mmol/L (ref 20–29)
Calcium: 9.6 mg/dL (ref 8.6–10.2)
Chloride: 101 mmol/L (ref 96–106)
Creatinine, Ser: 2.11 mg/dL — ABNORMAL HIGH (ref 0.76–1.27)
GFR calc Af Amer: 34 mL/min/{1.73_m2} — ABNORMAL LOW (ref >59)
GFR calc non Af Amer: 30 mL/min/{1.73_m2} — ABNORMAL LOW (ref >59)
Globulin, Total: 2.8 g/dL (ref 1.5–4.5)
Glucose: 100 mg/dL — ABNORMAL HIGH (ref 65–99)
Potassium: 4.4 mmol/L (ref 3.5–5.2)
Sodium: 140 mmol/L (ref 134–144)
Total Protein: 7 g/dL (ref 6.0–8.5)

## 2021-01-26 ENCOUNTER — Other Ambulatory Visit: Payer: Self-pay | Admitting: Neurology

## 2021-01-26 DIAGNOSIS — R4189 Other symptoms and signs involving cognitive functions and awareness: Secondary | ICD-10-CM

## 2021-01-27 ENCOUNTER — Other Ambulatory Visit: Payer: Self-pay

## 2021-01-27 ENCOUNTER — Ambulatory Visit (INDEPENDENT_AMBULATORY_CARE_PROVIDER_SITE_OTHER): Payer: Medicare Other

## 2021-01-27 DIAGNOSIS — I350 Nonrheumatic aortic (valve) stenosis: Secondary | ICD-10-CM

## 2021-01-27 DIAGNOSIS — I420 Dilated cardiomyopathy: Secondary | ICD-10-CM | POA: Diagnosis not present

## 2021-01-27 LAB — ECHOCARDIOGRAM COMPLETE
AR max vel: 0.8 cm2
AV Area VTI: 0.89 cm2
AV Area mean vel: 0.88 cm2
AV Mean grad: 19.7 mmHg
AV Peak grad: 37.6 mmHg
Ao pk vel: 3.07 m/s
Area-P 1/2: 3.21 cm2
MV M vel: 5.57 m/s
MV Peak grad: 124.1 mmHg
S' Lateral: 3.3 cm
Single Plane A4C EF: 58.1 %

## 2021-01-27 MED ORDER — PERFLUTREN LIPID MICROSPHERE
1.0000 mL | INTRAVENOUS | Status: AC | PRN
  Administered 2021-01-27: 4 mL via INTRAVENOUS

## 2021-01-31 ENCOUNTER — Telehealth: Payer: Self-pay

## 2021-01-31 NOTE — Telephone Encounter (Signed)
Unable to reach pt via phone after several attempts, LDM on VM of echo results, advised pt call back regarding his medication list of taking lasix, torsemide, or both.

## 2021-02-01 ENCOUNTER — Telehealth: Payer: Self-pay

## 2021-02-01 MED ORDER — LOSARTAN POTASSIUM 25 MG PO TABS: 25.0000 mg | ORAL_TABLET | Freq: Every day | ORAL | 3 refills | Status: DC

## 2021-02-01 MED ORDER — TORSEMIDE 20 MG PO TABS: ORAL_TABLET | ORAL | 3 refills | Status: DC

## 2021-02-01 NOTE — Telephone Encounter (Signed)
Was able to call pt's wife back Jesus Hendrix, went of pt recent echo results, [name] verbalized understanding, please with results at this time, Jesus Hendrix verbalized pt has been taken is lasix daily, but is out of refills on the torsemide 20 mg and is also out of refills on the losartan 25 mg. Both scripts sent in to [facility], will be avaiable for pick up later today. Jesus Hendrix is grateful, otherwise all questions or concerns were address and no additional concerns at this time.

## 2021-02-07 ENCOUNTER — Ambulatory Visit (INDEPENDENT_AMBULATORY_CARE_PROVIDER_SITE_OTHER): Payer: Medicare Other

## 2021-02-07 DIAGNOSIS — I5032 Chronic diastolic (congestive) heart failure: Secondary | ICD-10-CM | POA: Diagnosis not present

## 2021-02-07 DIAGNOSIS — Z950 Presence of cardiac pacemaker: Secondary | ICD-10-CM

## 2021-02-15 ENCOUNTER — Ambulatory Visit
Admission: RE | Admit: 2021-02-15 | Discharge: 2021-02-15 | Disposition: A | Discharge status: Home / Self Care | Payer: Medicare Other | Source: Ambulatory Visit | Attending: Neurology | Admitting: Neurology

## 2021-02-15 ENCOUNTER — Other Ambulatory Visit: Payer: Self-pay

## 2021-02-15 DIAGNOSIS — I6782 Cerebral ischemia: Secondary | ICD-10-CM | POA: Insufficient documentation

## 2021-02-15 DIAGNOSIS — G9389 Other specified disorders of brain: Secondary | ICD-10-CM | POA: Diagnosis not present

## 2021-02-15 DIAGNOSIS — R4189 Other symptoms and signs involving cognitive functions and awareness: Secondary | ICD-10-CM | POA: Insufficient documentation

## 2021-02-15 NOTE — Progress Notes (Signed)
EPIC Encounter for ICM Monitoring  Patient Name: Jesus Hendrix is a 75 y.o. male Date: 02/15/2021 Primary Care Physican: Maryland Pink, MD Primary Cardiologist:Gollan Electrophysiologist:Allred 12/28/2020 OfficeWeight:231lbs     Transmission reviewed.   CorVueThoracic impedancenormal  Prescribed:Torsemide20 mg take 1 tablettwice a day.  Labs: 01/11/2021 Creatinine 2.11, BUN 39, Potassium 4.4, Sodium 140, GFR 30-34 06/15/2020 Creatinine2.45, BUN40, Potassium4.1, Sodium138, FGH82-99 05/04/2020 Creatinine2033, BUN45, Potassium5.4, [id number], FYB01-75 A complete set of results can be found in Results Review.  Recommendations: No changes  Follow-up plan: ICM clinic phone appointment on4/03/2021.91 day device clinic remote transmission3/12/2020.  EP/Cardiology Office Visits:Recall for 08/17/2020 with Oda Kilts, PA.  Recall 07/10/2021 with Dr Rockey Situ.  Copy of ICM check sent to Dr.Allred.  3 month ICM trend: 02/07/2021.    1 Year ICM trend:       [name], RN 02/15/2021 12:45 PM

## 2021-03-21 ENCOUNTER — Telehealth: Payer: Self-pay

## 2021-03-21 ENCOUNTER — Ambulatory Visit (INDEPENDENT_AMBULATORY_CARE_PROVIDER_SITE_OTHER): Payer: Medicare Other

## 2021-03-21 ENCOUNTER — Telehealth: Payer: Self-pay | Admitting: Cardiovascular Disease

## 2021-03-21 DIAGNOSIS — Z950 Presence of cardiac pacemaker: Secondary | ICD-10-CM | POA: Diagnosis not present

## 2021-03-21 DIAGNOSIS — I5032 Chronic diastolic (congestive) heart failure: Secondary | ICD-10-CM | POA: Diagnosis not present

## 2021-03-21 MED ORDER — APIXABAN 5 MG PO TABS: 5.0000 mg | ORAL_TABLET | Freq: Two times a day (BID) | ORAL | 1 refills | Status: DC

## 2021-03-21 NOTE — Telephone Encounter (Signed)
Pt's age 74, wt 105.7 kg, SCr 2.11, CrCl 44.53, last ov w/ TG 01/11/21.

## 2021-03-21 NOTE — Telephone Encounter (Signed)
Remote ICM transmission received.  Attempted call to patient regarding ICM remote transmission and no answer or voice mail option on home phone.

## 2021-03-21 NOTE — Progress Notes (Signed)
EPIC Encounter for ICM Monitoring  Patient Name: Jesus Hendrix is a 75 y.o. male Date: 03/21/2021 Primary Care Physican: Maryland Pink, MD Primary Cardiologist:Gollan Electrophysiologist:Allred 1/11/2022OfficeWeight:231lbs     Attempted call to patient and unable to reach.   Transmission reviewed.   CorVueThoracic impedancesuggesting possible fluid accumulation starting 03/16/2021.  Prescribed:Torsemide20 mg take 1 tablettwice a day.  Labs: 01/11/2021 Creatinine 2.11, BUN 39, Potassium 4.4, Sodium 140, GFR 30-34 06/15/2020 Creatinine2.45, BUN40, Potassium4.1, Sodium138, NTI14-43 A complete set of results can be found in Results Review.  Recommendations: Unable to reach.    Follow-up plan: ICM clinic phone appointment on4/11/2021 to recheck fluid levels.91 day device clinic remote transmission5/31/2022.  EP/Cardiology Office Visits:Recall for 08/17/2020 with Oda Kilts, PA.  Recall 07/10/2021 with Dr Rockey Situ.  Copy of ICM check sent to Dr.Allred and Dr Rockey Situ.  3 month ICM trend: 03/21/2021.    1 Year ICM trend:       [name], RN 03/21/2021 11:01 AM

## 2021-03-21 NOTE — Telephone Encounter (Signed)
*  STAT* If patient is at the pharmacy, call can be transferred to refill team.   1. Which medications need to be refilled? (please list name of each medication and dose if known)  Eliquis 5 MG 1 tablet 2 times daily   2. Which pharmacy/location (including street and city if local pharmacy) is medication to be sent to? [facility] on [address] near [employer]  3. Do they need a 30 day or 90 day supply? 90 day

## 2021-03-21 NOTE — Telephone Encounter (Signed)
Attempted call to patient on cell phone number.  Left message to return call.

## 2021-03-29 ENCOUNTER — Ambulatory Visit (INDEPENDENT_AMBULATORY_CARE_PROVIDER_SITE_OTHER): Payer: Medicare Other

## 2021-03-29 DIAGNOSIS — I5032 Chronic diastolic (congestive) heart failure: Secondary | ICD-10-CM

## 2021-03-29 DIAGNOSIS — Z950 Presence of cardiac pacemaker: Secondary | ICD-10-CM

## 2021-03-30 NOTE — Progress Notes (Signed)
EPIC Encounter for ICM Monitoring  Patient Name: Jesus Hendrix is a 75 y.o. male Date: 03/30/2021 Primary Care Physican: Maryland Pink, MD Primary Cardiologist:Gollan Electrophysiologist:Allred 1/11/2022OfficeWeight:231lbs     Transmission reviewed.   CorVueThoracic impedancesuggesting fluid levels returned to normal.  Prescribed:Torsemide20 mg take 1 tablettwice a day.  Labs: 01/11/2021 Creatinine 2.11, BUN 39, Potassium 4.4, Sodium 140, GFR 30-34 06/15/2020 Creatinine2.45, BUN40, Potassium4.1, Sodium138, VDF17-92 A complete set of results can be found in Results Review.  Recommendations: No changes.  Follow-up plan: ICM clinic phone appointment on 05/18/2021.91 day device clinic remote transmission5/31/2022.  EP/Cardiology Office Visits:Recall for 08/17/2020 with Oda Kilts, PA. Recall 07/10/2021 with Dr Rockey Situ.  Copy of ICM check sent to Dr.Allred   3 month ICM trend: 03/27/2021.     [name], RN 03/30/2021 7:49 AM

## 2021-04-01 ENCOUNTER — Ambulatory Visit (INDEPENDENT_AMBULATORY_CARE_PROVIDER_SITE_OTHER): Payer: Medicare Other

## 2021-04-01 ENCOUNTER — Telehealth: Payer: Self-pay

## 2021-04-01 DIAGNOSIS — I440 Atrioventricular block, first degree: Secondary | ICD-10-CM | POA: Diagnosis not present

## 2021-04-01 LAB — CUP PACEART REMOTE DEVICE CHECK
Battery Remaining Longevity: 104 mo
Battery Remaining Percentage: 95.5 %
Battery Voltage: 2.99 V
Date Time Interrogation Session: 20220414170727
Implantable Lead Implant Date: 20190419
Implantable Lead Implant Date: 20190419
Implantable Lead Location: 753858
Implantable Lead Location: 753860
Implantable Pulse Generator Implant Date: 20190419
Lead Channel Impedance Value: 400 Ohm
Lead Channel Impedance Value: 750 Ohm
Lead Channel Pacing Threshold Amplitude: 0.75 V
Lead Channel Pacing Threshold Amplitude: 1 V
Lead Channel Pacing Threshold Pulse Width: 0.5 ms
Lead Channel Pacing Threshold Pulse Width: 0.5 ms
Lead Channel Sensing Intrinsic Amplitude: 12 mV
Lead Channel Setting Pacing Amplitude: 2 V
Lead Channel Setting Pacing Amplitude: 2.5 V
Lead Channel Setting Pacing Pulse Width: 0.5 ms
Lead Channel Setting Pacing Pulse Width: 0.5 ms
Lead Channel Setting Sensing Sensitivity: 4 mV
Pulse Gen Model: 3262
Pulse Gen Serial Number: 9017264

## 2021-04-01 NOTE — Telephone Encounter (Signed)
Merlin alert- 2 Alerts noted: BiV pacing less than limit, measured 80% (previous 77%).  1 HVR events 25 beats at 193bpm.  Last Echo 01/2021- EF 30-35%.  Pt is not on a BB.   Attempted to contact pt to assess symptoms and med compliance.  No answer.  Left VM on spouses cell # with device clinic # and hours.

## 2021-04-12 ENCOUNTER — Other Ambulatory Visit (HOSPITAL_COMMUNITY): Payer: Self-pay | Admitting: Neurosurgery

## 2021-04-12 ENCOUNTER — Other Ambulatory Visit: Payer: Self-pay | Admitting: Neurosurgery

## 2021-04-12 DIAGNOSIS — G910 Communicating hydrocephalus: Secondary | ICD-10-CM

## 2021-04-14 ENCOUNTER — Telehealth: Payer: Self-pay | Admitting: Cardiovascular Disease

## 2021-04-14 MED ORDER — APIXABAN 5 MG PO TABS: 5.0000 mg | ORAL_TABLET | Freq: Two times a day (BID) | ORAL | 1 refills | Status: DC

## 2021-04-14 NOTE — Telephone Encounter (Signed)
   [facility] Pre-operative Risk Assessment    Patient Name: Jesus Hendrix  DOB: 1946/10/11  MRN: [id number]   HEARTCARE STAFF: - Please ensure there is not already an duplicate clearance open for this procedure. - Under Visit Info/Reason for Call, type in Other and utilize the format Clearance MM/DD/YY or Clearance TBD. Do not use dashes or single digits. - If request is for dental extraction, please clarify the # of teeth to be extracted.  Request for surgical clearance:  1. What type of surgery is being performed? High volume lumbar puncture    2. When is this surgery scheduled? 04-19-21  3. What type of clearance is required (medical clearance vs. Pharmacy clearance to hold med vs. Both)?  both  4. Are there any medications that need to be held prior to surgery and how long?Eliquis    5. Practice name and name of physician performing surgery? Dr. Earnie Larsson  Ordering to be done at [facility]   6. What is the office phone number? [facility] [phone]   7.   What is the office fax number? [phone]  8.   Anesthesia type (None, local, MAC, general) ? Unknown    Clarisse Gouge 04/14/2021, 2:22 PM  _________________________________________________________________   (provider comments below)

## 2021-04-14 NOTE — Telephone Encounter (Signed)
Refill Request.  

## 2021-04-14 NOTE — Telephone Encounter (Signed)
Patient with diagnosis of afib on Eliquis for anticoagulation.    Procedure: High volume lumbar puncture   Date of procedure: 04/19/21  CHA2DS2-VASc Score = 5  This indicates a 7.2% annual risk of stroke. The patient's score is based upon: CHF History: Yes HTN History: Yes Diabetes History: No Stroke History: No Vascular Disease History: Yes Age Score: 2 Gender Score: 0     CrCl 35.10 ml/min Platelet count 251  Per office protocol, patient can hold Eliquis for 3 days prior to procedure.

## 2021-04-14 NOTE — Telephone Encounter (Signed)
53m, 105.7kg, scr 2.11 01/11/21, lovw/gollan 01/11/21

## 2021-04-14 NOTE — Telephone Encounter (Signed)
*  STAT* If patient is at the pharmacy, call can be transferred to refill team.   1. Which medications need to be refilled? (please list name of each medication and dose if known)  Eliquis   2. Which pharmacy/location (including street and city if local pharmacy) is medication to be sent to?[facility] [address] near [employer]   3. Do they need a 30 day or 90 day supply? [place]

## 2021-04-14 NOTE — Telephone Encounter (Signed)
Patient wife on DPR calling to have recent blood work sent to Raquel Sarna at [facility] for pre procedure clearance for a lumbar puncture .    Faxed last know labs in epic 01-11-21 to [facility] at [phone].

## 2021-04-15 ENCOUNTER — Telehealth: Payer: Self-pay | Admitting: Cardiovascular Disease

## 2021-04-15 NOTE — Telephone Encounter (Signed)
   Primary Cardiologist: Ida Rogue, MD  Chart reviewed as part of pre-operative protocol coverage. Given past medical history and time since last visit, based on ACC/AHA guidelines, Jesus Hendrix would be at acceptable risk for the planned procedure without further cardiovascular testing.   Patient with diagnosis of afib on Eliquis for anticoagulation.    Procedure: High volume lumbar puncture Date of procedure: 04/19/21  CHA2DS2-VASc Score = 5  This indicates a 7.2% annual risk of stroke. The patient's score is based upon: CHF History: Yes HTN History: Yes Diabetes History: No Stroke History: No Vascular Disease History: Yes Age Score: 2 Gender Score: 0     CrCl 35.10 ml/min Platelet count 251  Per office protocol, patient can hold Eliquis for 3 days prior to procedure.  Patient was advised that if he develops new symptoms prior to surgery to contact our office to arrange a follow-up appointment.  He verbalized understanding.  I will route this recommendation to the requesting party via Epic fax function and remove from pre-op pool.  Please call with questions.  Jossie Ng. Laquincy Eastridge NP-C    04/15/2021, 9:52 AM [address] [place] [address] Office [phone] Fax [phone]

## 2021-04-15 NOTE — Telephone Encounter (Signed)
Contacted Vanessa at [facility].  Reviewed patient's preoperative clearance request and recommendations for Eliquis hold.  She expressed understanding.  No further requests at this time.

## 2021-04-15 NOTE — Telephone Encounter (Signed)
Attempted to contact patient at home number and cell phone number.Unable to make contact with patient or DPR. Attempted home and cell numbers with no option to leave message or voicemail. Previous message left for patient to call Device Clinic on 04/01/21.

## 2021-04-15 NOTE — Telephone Encounter (Signed)
Jesus Hendrix calling from [facility] regarding Jesus Hendrix needs status  & to discuss CRCL

## 2021-04-19 ENCOUNTER — Encounter (HOSPITAL_COMMUNITY): Payer: Self-pay

## 2021-04-19 ENCOUNTER — Ambulatory Visit (HOSPITAL_COMMUNITY): Admission: RE | Admit: 2021-04-19 | Payer: Medicare Other | Source: Ambulatory Visit

## 2021-04-19 NOTE — Telephone Encounter (Signed)
Spoke with Kermit Balo( wife, DPR) who reports that patient has been having some memory issues and to call her to schedule appointment at patient's cell # listed. Patient has been taking his medication and has not had any syncope, dizziness, cp , SOB , or chest pressure around or since NSVT episode on 04/01/21. Will expect call from scheduler due to overdue for appointment with [facility] PA.

## 2021-04-19 NOTE — Progress Notes (Signed)
Remote pacemaker transmission.   

## 2021-04-20 NOTE — Telephone Encounter (Signed)
Error

## 2021-04-26 ENCOUNTER — Encounter: Payer: Medicare Other | Admitting: Student

## 2021-04-29 DIAGNOSIS — R339 Retention of urine, unspecified: Secondary | ICD-10-CM | POA: Diagnosis not present

## 2021-05-03 ENCOUNTER — Ambulatory Visit (HOSPITAL_COMMUNITY): Payer: Medicare Other

## 2021-05-09 ENCOUNTER — Other Ambulatory Visit: Payer: Self-pay

## 2021-05-09 ENCOUNTER — Encounter: Payer: Medicare Other | Admitting: Student

## 2021-05-09 ENCOUNTER — Ambulatory Visit (HOSPITAL_COMMUNITY)
Admission: RE | Admit: 2021-05-09 | Discharge: 2021-05-09 | Disposition: A | Discharge status: Home / Self Care | Payer: Medicare Other | Source: Ambulatory Visit | Attending: Neurosurgery | Admitting: Neurosurgery

## 2021-05-09 DIAGNOSIS — G910 Communicating hydrocephalus: Secondary | ICD-10-CM

## 2021-05-09 DIAGNOSIS — G912 (Idiopathic) normal pressure hydrocephalus: Secondary | ICD-10-CM | POA: Diagnosis not present

## 2021-05-09 LAB — CSF CELL COUNT WITH DIFFERENTIAL
RBC Count, CSF: 630 /mm3 — ABNORMAL HIGH
Tube #: 3
WBC, CSF: 1 /mm3 (ref 0–5)

## 2021-05-09 LAB — PROTEIN, CSF: Total  Protein, CSF: 47 mg/dL — ABNORMAL HIGH (ref 15–45)

## 2021-05-09 LAB — GLUCOSE, CSF: Glucose, CSF: 68 mg/dL (ref 40–70)

## 2021-05-09 MED ORDER — LIDOCAINE HCL (PF) 1 % IJ SOLN
5.0000 mL | Freq: Once | INTRAMUSCULAR | Status: AC
  Administered 2021-05-09: 5 mL via INTRADERMAL

## 2021-05-09 NOTE — Procedures (Signed)
The patient was postioned in prone slightly oblique position and the L2-3 level was accessed with a 20 gauge spinal needle without difficulty.  Clear CSF with only minimal blood tinge after slight turn of needle to improve flow. Subsequent clearing of csf.  The patient tolerated the procedure well. 24 total cc removed.

## 2021-05-09 NOTE — Discharge Instructions (Signed)
Lumbar Puncture, Care After This sheet gives you information about how to care for yourself after your procedure. Your health care provider may also give you more specific instructions. If you have problems or questions, contact your health care provider. What can I expect after the procedure? After the procedure, it is common to have:  Mild discomfort or pain at the puncture site.  A mild headache that is relieved with pain medicines. Follow these instructions at home: Activity  Lie down flat or rest for as long as directed by your health care provider.  Return to your normal activities as told by your health care provider. Ask your health care provider what activities are safe for you.  Avoid lifting anything heavier than 10 lb (4.5 kg) for at least 12 hours after the procedure.  Do not drive for 24 hours if you were given a medicine to help you relax (sedative) during your procedure.  Do not drive or use heavy machinery while taking prescription pain medicine.   Puncture site care  Remove or change your bandage (dressing) as told by your health care provider.  Check your puncture area every day for signs of infection. Check for: ? More pain. ? Redness or swelling. ? Fluid or blood leaking from the puncture site. ? Warmth. ? Pus or a bad smell. General instructions  Take over-the-counter and prescription medicines only as told by your health care provider.  Drink enough fluids to keep your urine clear or pale yellow. Your health care provider may recommend drinking caffeine to prevent a headache.  Keep all follow-up visits as told by your health care provider. This is important. Contact a health care provider if:  You have fever or chills.  You have nausea or vomiting.  You have a headache that lasts for more than 2 days or does not get better with medicine. Get help right away if:  You develop any of the following in your  legs: ? Weakness. ? Numbness. ? Tingling.  You are unable to control when you urinate or have a bowel movement (incontinence).  You have signs of infection around your puncture site, such as: ? More pain. ? Redness or swelling. ? Fluid or blood leakage. ? Warmth. ? Pus or a bad smell.  You are dizzy or you feel like you might faint.  You have a severe headache, especially when you sit or stand. Summary  A lumbar puncture is a procedure in which a small needle is inserted into the lower back to remove fluid that surrounds the brain and spinal cord.  After this procedure, it is common to have a headache and pain around the needle insertion area.  Lying flat, staying hydrated, and drinking caffeine can help prevent headaches.  Monitor your needle insertion site for signs of infection, including warmth, fluid, or more pain.  Get help right away if you develop leg weakness, leg numbness, incontinence, or severe headaches. This information is not intended to replace advice given to you by your health care provider. Make sure you discuss any questions you have with your health care provider. Document Revised: 10/14/2020 Document Reviewed: 10/14/2020 Elsevier Patient Education  2021 [employer].

## 2021-05-12 LAB — CSF CULTURE W GRAM STAIN
Culture: NO GROWTH
Gram Stain: NONE SEEN

## 2021-05-18 ENCOUNTER — Ambulatory Visit (INDEPENDENT_AMBULATORY_CARE_PROVIDER_SITE_OTHER): Payer: Medicare Other

## 2021-05-18 DIAGNOSIS — Z950 Presence of cardiac pacemaker: Secondary | ICD-10-CM | POA: Diagnosis not present

## 2021-05-18 DIAGNOSIS — I5042 Chronic combined systolic (congestive) and diastolic (congestive) heart failure: Secondary | ICD-10-CM | POA: Diagnosis not present

## 2021-05-20 NOTE — Progress Notes (Signed)
EPIC Encounter for ICM Monitoring  Patient Name: Jesus Hendrix is a 75 y.o. male Date: 05/20/2021 Primary Care Physican: Maryland Pink, MD Primary Cardiologist:Gollan Electrophysiologist:Allred 4/29/2022OfficeWeight:235lbs     Transmission reviewed.  CorVueThoracic impedancenormal but was suggesting possible fluid accumulation from 5/16-5/25.  Prescribed:Torsemide20 mg take 1 tablettwice a day.  Labs: 01/11/2021 Creatinine 2.11, BUN 39, Potassium 4.4, Sodium 140, GFR 30-34 06/15/2020 Creatinine2.45, BUN40, Potassium4.1, Sodium138, YJE56-31 A complete set of results can be found in Results Review.  Recommendations: No changes.  Follow-up plan: ICM clinic phone appointment on 06/21/2021.91 day device clinic remote transmission7/15/2022.  EP/Cardiology Office Visits:05/23/2020 with Oda Kilts, PA. Recall 07/10/2021 with Dr Rockey Situ.  Copy of ICM check sent to Dr.Allred   3 month ICM trend: 05/18/2021.    1 Year ICM trend:       [name], RN 05/20/2021 1:41 PM

## 2021-05-23 ENCOUNTER — Encounter: Payer: Medicare Other | Admitting: Student

## 2021-05-23 NOTE — Progress Notes (Deleted)
Electrophysiology Office Note Date: 05/23/2021  ID:  Jesus Hendrix, DOB 22-Mar-1946, MRN [id number]  PCP: Maryland Pink, MD Primary Cardiologist: Ida Rogue, MD Electrophysiologist: [name], MD   CC: Pacemaker follow-up  Jesus Hendrix is a 75 y.o. male seen today for [name], MD for routine electrophysiology followup.  Since last being seen in our clinic the patient reports doing ***.  he denies chest pain, palpitations, dyspnea, PND, orthopnea, nausea, vomiting, dizziness, syncope, edema, weight gain, or early satiety.  Device History: St. 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 Anxiety   . Aortic stenosis    a. mild on echo 04/2014  . AV block, Mobitz 2    a. 03/2018 s/p SJM Jennette Banker MP RF model CD [id number] BiV PPM (ser #: [id number]).  . Choledocholithiasis   . Chronic systolic CHF (congestive heart failure) ([place])    a. echo 04/2014: EF 45-50%, nl LV dia fxn, mild AS, LA mildly dilated, PASP nl; b. 07/2019 Echo: EF 35-40%, diff HK, Nl RV fxn, RVSP 53.65mmHg. Sev dil LA. Mod dil RA. Mild to mod MR. Mod TR. Mod AS. Ao root 37cm.  . CKD (chronic kidney disease), stage IV ([place])   . COPD (chronic obstructive pulmonary disease) ([place])    Not on home oxygen  . Coronary artery disease    a. status post CABG in 2007; b. nuc stress test 2014: small region of mild ischemia in the inferior territory, EF 61%, no ischemia, scan similar to prior scan in 2009; c. 07/2016 MV: No ischemia.  . Exogenous obesity   . GERD (gastroesophageal reflux disease)   . Gout   . Hiatal hernia   . Hip pain, chronic 08/03/2016   Waiting to have hip replacement pending NM stress test results per patient.  . History of colonic diverticulitis   . History of renal cell carcinoma    a. s/p laser ablation   . Hyperlipidemia   . Hypertension   . Moderate aortic stenosis    a. 07/2019 Echo: Mod AS.  Marland Kitchen Near syncope     a. 3 separate events in 2017  . Nephrolithiasis   . Panic attacks   . Permanent atrial fibrillation ([place]) 06/06/2016   a. Dx 06/06/2016; b. CHADS2VASc => 5 (CHF, HTN, age x 1, DM, vascular disease)-->was on Eliquis but d/c'd 2/2 anemia and hemoptysis.  Marland Kitchen Spinal stenosis   . Temporomandibular joint pain dysfunction syndrome   . Umbilical hernia   . Vitamin D deficiency   . Wegener's granulomatosis with renal involvement [facility])    Past Surgical History:  Procedure Laterality Date  . CARDIAC CATHETERIZATION    . CHOLECYSTECTOMY    . COLONOSCOPY N/A 06/03/2015   Procedure: COLONOSCOPY;  Surgeon: Lucilla Lame, MD;  Location: [place];  Service: Gastroenterology;  Laterality: N/A;  . CORONARY ARTERY BYPASS GRAFT  2004   CABG x 3  . EYE SURGERY    . HERNIA REPAIR     x 2  . HIP SURGERY     right hip replacement  . INTRAOCULAR LENS INSERTION    . KIDNEY SURGERY     cancer  . NOSE SURGERY    . PACEMAKER IMPLANT N/A 04/05/2018   SJM Biventricular pacemaker implanted by Dr Rayann Heman for second degree AV block, EF < 50% and permanent afib  . POLYPECTOMY  06/03/2015   Procedure: POLYPECTOMY;  Surgeon: Evangeline Gula  Allen Norris, MD;  Location: [place];  Service: Gastroenterology;;  . PROSTATE SURGERY    . TOTAL HIP ARTHROPLASTY Left 12/19/2016   Procedure: LEFT TOTAL HIP ARTHROPLASTY;  Surgeon: Garald Balding, MD;  Location: [place];  Service: Orthopedics;  Laterality: Left;    Current Outpatient Medications  Medication Sig Dispense Refill  . acetaminophen (TYLENOL) 325 MG tablet Take 1 tablet by mouth as needed for pain.    Marland Kitchen ALPRAZolam (XANAX) 0.5 MG tablet Take 0.5 mg by mouth at bedtime.    Marland Kitchen apixaban (ELIQUIS) 5 MG TABS tablet Take 1 tablet (5 mg total) by mouth 2 (two) times daily. 180 tablet 1  . Cholecalciferol (VITAMIN D) 2000 units CAPS Take 2,000 Units by mouth at bedtime.     . clonazePAM (KLONOPIN) 1 MG tablet Take 1 tablet by mouth as needed for sleep.    . DONEPEZIL HCL PO  Take 5 mg by mouth daily.    . famotidine (PEPCID) 20 MG tablet TAKE 1 TABLET(20 MG) BY MOUTH EVERY DAY AT BEDTIME AS NEEDED FOR HEART BURN 90 tablet 0  . FLUoxetine (PROZAC) 10 MG capsule Take 10 mg by mouth daily.    . fluticasone (FLONASE) 50 MCG/ACT nasal spray SHAKE LIQUID AND USE 2 SPRAYS IN EACH NOSTRIL EVERY DAY    . furosemide (LASIX) 40 MG tablet Take 40 mg by mouth daily.    Marland Kitchen losartan (COZAAR) 25 MG tablet Take 1 tablet (25 mg total) by mouth daily. 90 tablet 3  . pantoprazole (PROTONIX) 40 MG tablet Take 1 tablet (40 mg) by mouth once daily before supper    . torsemide (DEMADEX) 20 MG tablet TAKE 1 TABLET(20 MG) BY MOUTH TWICE DAILY 180 tablet 3  . venlafaxine XR (EFFEXOR-XR) 37.5 MG 24 hr capsule Take by mouth.     No current facility-administered medications for this visit.    Allergies:   Clonazepam   Social History: Social History   Socioeconomic History  . Marital status: Married    Spouse name: Not on file  . Number of children: Not on file  . Years of education: Not on file  . Highest education level: Not on file  Occupational History  . Not on file  Tobacco Use  . Smoking status: Former Smoker    Packs/day: 0.50    Years: 25.00    Pack years: 12.50    Types: Cigarettes    Quit date: 12/29/1974    Years since quitting: 46.4  . Smokeless tobacco: Never Used  Vaping Use  . Vaping Use: Never used  Substance and Sexual Activity  . Alcohol use: No    Alcohol/week: 2.0 standard drinks    Types: 2 Cans of beer per week    Comment: social  . Drug use: No  . Sexual activity: Not on file  Other Topics Concern  . Not on file  Social History Narrative  . Not on file   Social Determinants of Health   Financial Resource Strain: Not on file  Food Insecurity: Not on file  Transportation Needs: Not on file  Physical Activity: Not on file  Stress: Not on file  Social Connections: Not on file  Intimate Partner Violence: Not on file    Family  History: Family History  Problem Relation Age of Onset  . Heart disease Mother   . Heart disease Sister   . Heart disease Brother      Review of Systems: All other systems reviewed and are otherwise negative  except as noted above.  Physical Exam: There were no vitals filed for this visit.   GEN- The patient is well appearing, alert and oriented x 3 today.   HEENT: normocephalic, atraumatic; sclera clear, conjunctiva pink; hearing intact; oropharynx clear; neck supple  Lungs- Clear to ausculation bilaterally, normal work of breathing.  No wheezes, rales, rhonchi Heart- Regular rate and rhythm, no murmurs, rubs or gallops  GI- soft, non-tender, non-distended, bowel sounds present  Extremities- no clubbing or cyanosis. No edema MS- no significant deformity or atrophy Skin- warm and dry, no rash or lesion; PPM pocket well healed Psych- euthymic mood, full affect Neuro- strength and sensation are intact  PPM Interrogation- reviewed in detail today,  See PACEART report  EKG:  EKG is not ordered today.  Recent Labs: 06/15/2020: B Natriuretic Peptide 114.5 01/11/2021: ALT 33; BUN 39; Creatinine, Ser 2.11; Potassium 4.4; Sodium 140   Wt Readings from Last 3 Encounters:  01/11/21 233 lb (105.7 kg)  07/06/20 237 lb (107.5 kg)  06/07/20 233 lb (105.7 kg)     Other studies Reviewed: Additional studies/ records that were reviewed today include: Previous EP office notes, Previous remote checks, Most recent labwork.   Assessment and Plan:  1. Symptomatic bradycardia in the setting of second degree AV block s/p St. Jude PPM (BiV) Normal PPM function See Pace Art report No changes today BiV % in the 70-80s chronically with AF.   2. Permanent Afib Back on eliquis after hemoptysis in the past CHA2DS2VASC of at least 5.   *** HVR 03/2021  3. Chronic systolic CHF Echo 12/5174 LVEF 30-35% (relatively stable from 07/2019 which 35-40%) Continue losartan 25 mg dail   Current medicines  are reviewed at length with the patient today.   The patient does not have concerns regarding his medicines.  The following changes were made today:  {NONE DEFAULTED:18576::"none"}  Labs/ tests ordered today include: *** No orders of the defined types were placed in this encounter.    Disposition:   Follow up with Dr. Rayann Heman in 12 Months    Signed, Annamaria Helling  05/23/2021 8:36 AM  [facility] [address] [place] [address] [phone] (office) [phone] (fax)

## 2021-05-30 DIAGNOSIS — Z0000 Encounter for general adult medical examination without abnormal findings: Secondary | ICD-10-CM | POA: Diagnosis not present

## 2021-06-01 DIAGNOSIS — G910 Communicating hydrocephalus: Secondary | ICD-10-CM | POA: Diagnosis not present

## 2021-06-06 DIAGNOSIS — R339 Retention of urine, unspecified: Secondary | ICD-10-CM | POA: Diagnosis not present

## 2021-06-08 ENCOUNTER — Other Ambulatory Visit: Payer: Self-pay | Admitting: Neurosurgery

## 2021-06-10 ENCOUNTER — Telehealth: Payer: Self-pay | Admitting: Cardiovascular Disease

## 2021-06-10 ENCOUNTER — Other Ambulatory Visit: Payer: Medicare Other

## 2021-06-10 NOTE — Telephone Encounter (Signed)
Patient with diagnosis of afib on Eliquis for anticoagulation.    Procedure: VP SHUNT PLACEMENT Date of procedure: 06/14/21  CHA2DS2-VASc Score = 5  This indicates a 7.2% annual risk of stroke. The patient's score is based upon: CHF History: Yes HTN History: Yes Diabetes History: No Stroke History: No Vascular Disease History: Yes Age Score: 2 Gender Score: 0     CrCl 35 ml/min Platelet count 251  Per office protocol, patient can hold Eliquis for 3 days prior to procedure.

## 2021-06-10 NOTE — Telephone Encounter (Signed)
   [facility] Pre-operative Risk Assessment    Patient Name: Jesus Hendrix  DOB: 25-Nov-1946  MRN: [id number]   HEARTCARE STAFF: - Please ensure there is not already an duplicate clearance open for this procedure. - Under Visit Info/Reason for Call, type in Other and utilize the format Clearance MM/DD/YY or Clearance TBD. Do not use dashes or single digits. - If request is for dental extraction, please clarify the # of teeth to be extracted. - If the patient is currently at the dentist's office, call Pre-Op APP to address. If the patient is not currently in the dentist office, please route to the Pre-Op pool  Request for surgical clearance:  What type of surgery is being performed? VP SHUNT PLACEMENT  When is this surgery scheduled? 06/14/21  What type of clearance is required (medical clearance vs. Pharmacy clearance to hold med vs. Both)? NOT LISTED  Are there any medications that need to be held prior to surgery and how long? NOT LISTED  Practice name and name of physician performing surgery? DR Earnie Larsson, [facility]  What is the office phone number? NOT LISTED   7.   What is the office fax number? [phone]  8.   Anesthesia type (None, local, MAC, general) GENERAL   Jesus Hendrix 06/10/2021, 3:08 PM  _________________________________________________________________   (provider comments below)

## 2021-06-11 NOTE — Progress Notes (Addendum)
Cardiac Device form sent to device clinic. Paged Charolotte Capuchin, rep for [facility]. Jude) of upcoming surgery. He is aware of surgery time and date. Stated he will put this patient on the list.

## 2021-06-13 ENCOUNTER — Other Ambulatory Visit: Payer: Self-pay

## 2021-06-13 ENCOUNTER — Encounter (HOSPITAL_COMMUNITY): Payer: Self-pay | Admitting: Neurosurgery

## 2021-06-13 ENCOUNTER — Encounter (HOSPITAL_COMMUNITY): Payer: Self-pay | Admitting: Emergency Medicine

## 2021-06-13 ENCOUNTER — Encounter: Payer: Self-pay | Admitting: Internal Medicine

## 2021-06-13 ENCOUNTER — Other Ambulatory Visit
Admission: RE | Admit: 2021-06-13 | Discharge: 2021-06-13 | Disposition: A | Discharge status: Home / Self Care | Payer: Medicare Other | Source: Ambulatory Visit | Attending: Neurosurgery | Admitting: Neurosurgery

## 2021-06-13 DIAGNOSIS — U071 COVID-19: Secondary | ICD-10-CM | POA: Diagnosis not present

## 2021-06-13 DIAGNOSIS — Z01812 Encounter for preprocedural laboratory examination: Secondary | ICD-10-CM | POA: Insufficient documentation

## 2021-06-13 LAB — SARS CORONAVIRUS 2 (TAT 6-24 HRS): SARS Coronavirus 2: POSITIVE — AB

## 2021-06-13 NOTE — Anesthesia Preprocedure Evaluation (Deleted)
Anesthesia Evaluation    Airway        Dental   Pulmonary former smoker,           Cardiovascular hypertension,      Neuro/Psych    GI/Hepatic   Endo/Other    Renal/GU      Musculoskeletal   Abdominal   Peds  Hematology   Anesthesia Other Findings   Reproductive/Obstetrics                             Anesthesia Physical Anesthesia Plan  ASA:   Anesthesia Plan:    Post-op Pain Management:    Induction:   PONV Risk Score and Plan:   Airway Management Planned:   Additional Equipment:   Intra-op Plan:   Post-operative Plan:   Informed Consent:   Plan Discussed with:   Anesthesia Plan Comments: (PAT note written 06/13/2021 by Myra Gianotti, PA-C. )        Anesthesia Quick Evaluation

## 2021-06-13 NOTE — Progress Notes (Signed)
PERIOPERATIVE PRESCRIPTION FOR IMPLANTED CARDIAC DEVICE PROGRAMMING  Patient Information: Name:  Jesus Hendrix  DOB:  1946/03/11  MRN:  [id number]    Lorin Glass, RN  P [facility] Device Cc:Clayborn Bigness, RN Planned Procedure:  Shunt Placement-Right  Surgeon:  Dr. Earnie Larsson  Date of Procedure:  06/14/21  Cautery will be used.  Position during surgery:     Please send documentation back to:  [facility] Pontes (Fax # [phone])   Lorin Glass, RN  06/11/2021 2:58 PM  Device Information:  Clinic EP Physician:  [name], MD   Device Type:  Pacemaker Manufacturer and Phone #:  St. Jude/[employer]: [phone] Pacemaker Dependent?:  Unknown Date of Last Device Check:  05/18/21 Remote Normal Device Function?:  Yes.    Electrophysiologist's Recommendations:  Unable to provide recommendations due to non-compliance with in-clinic checks.  Patient last seen in clinic 2019.  Please have device interrogated by industry prior to procedure.  Have magnet available. Provide continuous ECG monitoring when magnet is used or reprogramming is to be performed.   Per Device Clinic Standing Orders, York Ram, RN  1:32 PM 06/13/2021

## 2021-06-13 NOTE — Progress Notes (Signed)
PCP - Jeneen Rinks hedrick Cardiologist - timothy gollan  PPM/ICD - yes, st jude Device Orders - faxed to ep physician Rep Notified - yes, see cathys note  Chest x-ray - n/a EKG - 01/11/21 Stress Test - 11/04/13 ECHO - 01/27/21 Cardiac Cath - 2004  CPAP - no  No diabetes  Stop taking eliquis 3 days prior to surgery per cardiologist  ERAS Protocol - no  COVID TEST- 06/13/21 at [facility] at 1130am  Anesthesia review: yes, pacemaker  Patient verbally denies any shortness of breath, fever, cough and chest pain during phone call   -------------  SDW INSTRUCTIONS given:  Your procedure is scheduled on 06/14/21.  Report to [facility] Main Entrance "A" at 09:30 A.M., and check in at the Admitting office.  Call this number if you have problems the morning of surgery:  [phone]   Remember:  Do not eat or drink after midnight the night before your surgery      Take these medicines the morning of surgery with A SIP OF WATER  Prozac Flonase Effexor Tylenol if needed  Stop taking eliquis 3 days prior to surgery.                       Do not wear jewelry, make up, or nail polish            Do not wear lotions, powders, perfumes/colognes, or deodorant.            Do not shave 48 hours prior to surgery.  Men may shave face and neck.            Do not bring valuables to the hospital.            [facility] is not responsible for any belongings or valuables.  Do NOT Smoke (Tobacco/Vaping) or drink Alcohol 24 hours prior to your procedure If you use a CPAP at night, you may bring all equipment for your overnight stay.   Contacts, glasses, dentures or bridgework may not be worn into surgery.      For patients admitted to the hospital, discharge time will be determined by your treatment team.   Patients discharged the day of surgery will not be allowed to drive home, and someone needs to stay with them for 24 hours.    Special instructions:   [place]- Preparing For  Surgery  Before surgery, you can play an important role. Because skin is not sterile, your skin needs to be as free of germs as possible. You can reduce the number of germs on your skin by washing with CHG (chlorahexidine gluconate) Soap before surgery.  CHG is an antiseptic cleaner which kills germs and bonds with the skin to continue killing germs even after washing.    Oral Hygiene is also important to reduce your risk of infection.  Remember - BRUSH YOUR TEETH THE MORNING OF SURGERY WITH YOUR REGULAR TOOTHPASTE  Please do not use if you have an allergy to CHG or antibacterial soaps. If your skin becomes reddened/irritated stop using the CHG.  Do not shave (including legs and underarms) for at least 48 hours prior to first CHG shower. It is OK to shave your face.  Please follow these instructions carefully.   Shower the NIGHT BEFORE SURGERY and the MORNING OF SURGERY with DIAL Soap.   Pat yourself dry with a CLEAN TOWEL.  Wear CLEAN PAJAMAS to bed the night before surgery  Place CLEAN SHEETS on your bed the  night of your first shower and DO NOT SLEEP WITH PETS.   Day of Surgery: Please shower morning of surgery  Wear Clean/Comfortable clothing the morning of surgery Do not apply any deodorants/lotions.   Remember to brush your teeth WITH YOUR REGULAR TOOTHPASTE.   Questions were answered. Patient verbalized understanding of instructions.

## 2021-06-13 NOTE — Progress Notes (Signed)
Anesthesia Chart Review: Jesus Hendrix   Case: [id number] Date/Time: 06/14/21 1145   Procedure: Shunt Placment - right (Right)   Anesthesia type: General   Pre-op diagnosis: Hydrocephalus   Location: [facility] OR ROOM 57 / [place] OR   Surgeons: Earnie Larsson, MD       DISCUSSION: Patient is a 75 year old male scheduled for the above procedure.   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 He had evidence of remote right sided lacunar infarct on 06/06/16 head CT.  Last cardiology follow-up 01/11/21 with Dr. Rockey Situ. CAD felt stable, so no further work-up recommended at this time. He encouraged patient to scheduled follow-up with nephrology for his Wegener's (last visit 03/2020; no hemoptysis, not currently on medications for [facility]). Per notation by Fabian Sharp, PA with [facility], ".Marland KitchenMarland KitchenPer office protocol, patient can hold Eliquis for 3 days prior to procedure".  [employer] notified of upcoming surgery per PAT RN.   Anesthesia team to evaluate on the day of surgery. 06/13/21 presurgical COVID-19 test in process.   VS: Ht 5\' 8"  (1.727 m)   Wt 104.3 kg   BMI 34.97 kg/m  BP Readings from Last 3 Encounters:  05/09/21 (!) 140/56  01/11/21 130/74  07/06/20 130/60   Pulse Readings from Last 3 Encounters:  05/09/21 67  01/11/21 60  07/06/20 60     PROVIDERS: Maryland Pink, MD is PCP Jefm Bryant, [facility] CE) - Ida Rogue,  MD is cardiologist - [name], MD is EP - Jennings Books, MD is neurologist Jefm Bryant, [facility] CE) - Wallene Huh, MD is pulmonologist Jefm Bryant, [facility] CE). Evaluation 03/31/20.  - Denu-Ciocca, Caren Griffins, MD is nephrologist. 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 [facility] CE) - Kathrin Ruddy, MD is Urologist - Nephrologist is Dr. Caren Griffins Denu-Ciocca at [facility], last visit 11/13/16. He was without any active signs of vasculitis by exam. Urine sediment was not active and renal function was stable (BUN 30, Cr 1.28). He has known persistent + PR3 ANCA titer but given his long history of diminutive suppressive history neurologic cancer she did not favor further immunosuppressant therapy and absence of symptomatic disease. She recommended continued monitoring. She is aware of surgery plans. - Notes suggest he also sees a rheumatologist   LABS: For day of procedure as indicated. As of 01/11/21 lab results included: Lab Results  Component Value Date   WBC 6.9 05/04/2020   HGB 11.6 (L) 05/04/2020   HCT 33.7 (L) 05/04/2020   PLT 251 05/04/2020   GLUCOSE 100 (H) 01/11/2021   ALT 33 01/11/2021   AST 30 01/11/2021   NA 140 01/11/2021   K 4.4 01/11/2021   CL 101 01/11/2021   CREATININE 2.11 (H) 01/11/2021   BUN 39 (H) 01/11/2021   CO2 25 01/11/2021   In CHL, Creatinine 2.11-2.61 since 06/2019.    IMAGES: CT Head 02/15/21: IMPRESSION: 1. Since 2017, progressive diffuse ventriculomegaly that is slightly out of proportion to the degree of sulcal volume loss. While this could relate to central  predominant cerebral volume loss, normal pressure hydrocephalus could have a similar appearance in the correct clinical setting. 2. Otherwise, no acute intracranial abnormality. 3. Moderate chronic microvascular ischemic disease.   EKG: 01/11/21: Ventricular paced rhythm.   CV: Echo 01/27/21: IMPRESSIONS   1. Left ventricular ejection fraction, by  estimation, is 30 to 35%. The  left ventricle has moderate to severely decreased function. The left  ventricle demonstrates global hypokinesis. Left ventricular diastolic  function could not be evaluated.   2. Right ventricular systolic function is moderately reduced. The right  ventricular size is normal. There is moderately elevated pulmonary artery  systolic pressure.   3. The mitral valve is grossly normal. No evidence of mitral valve  regurgitation.   4. Tricuspid valve regurgitation not assessed.   5. The aortic valve was not well visualized. Aortic valve regurgitation  is not visualized. Aortic valve mean gradient measures 19.7  mmHg. Aortic valve peak gradient measures 37.6 mmHg. Aortic valve area, by  VTI measures 0.89 cm.   6. Pulmonic valve regurgitation not assessed.  - Comparison 08/15/19: LVED 35-40%, LV diffuse hypokinesis, RV normal systolic function, RVSP 09.4 mmHg, LA severely dilated, RA moderately dilated, mild-moderate MR, moderate TR, mild AR, moderate AS (calculated AVA VTI 1.05 cm, mean grad 22.5 mmHg, peak grad 41.1 mmHg), aortic root 3.7 cm.    Nuclear stress test 10/06/19: Defect 1: There is a medium defect of moderate severity present in the apical inferior and apex location. This defect is fixed and likely represent a previous infarct. This is an intermediate risk study mainly due to reduced EF. Nuclear stress EF: 37%. No reversible ischemia is noted.   30 day event monitor 06/2016: Chronic atrial fibrillation with intraventricular conduction delay Frequent PVCs Minimum heart rate 50 bpm at 5:26 AM June 24 Maximum heart rate 125 bpm,  11:56 AM, July 10     Carotid U/S 05/05/14:  Impression:  Heterogeneous plaque, bilaterally. 1-39% bilateral ICA stenosis. Patent vertebral arteries with antegrade flow. Normal subclavian arteries, bilaterally.    According to cardiology records, "Previous cardiac catheterization in January 2007 details 75-80% long lesion  in the LAD at the ostium, 95% lesion in the PDA. He was referred to bypass surgery."   Past Medical History:  Diagnosis Date   Anxiety    Aortic stenosis    a. mild on echo 04/2014   AV block, Mobitz 2    a. 03/2018 s/p SJM Jennette Banker MP RF model CD [id number] BiV PPM (ser #: [id number]).   Choledocholithiasis    Chronic systolic CHF (congestive heart failure) ([place])    a. echo 04/2014: EF 45-50%, nl LV dia fxn, mild AS, LA mildly dilated, PASP nl; b. 07/2019 Echo: EF 35-40%, diff HK, Nl RV fxn, RVSP 53.64mmHg. Sev dil LA. Mod dil RA. Mild to mod MR. Mod TR. Mod AS. Ao root 37cm.   CKD (chronic kidney disease), stage IV (HCC)    COPD (chronic obstructive pulmonary disease) (HCC)    Not on home oxygen   Coronary artery disease    a. status post CABG in 2007; b. nuc stress test 2014: small region of mild ischemia in the inferior territory, EF 61%, no ischemia, scan similar to prior scan in 2009; c. 07/2016 MV: No ischemia.   Exogenous obesity    GERD (gastroesophageal reflux disease)    Gout    Hiatal hernia    Hip pain, chronic 08/03/2016   Waiting to have hip replacement pending NM stress test results per  patient.   History of colonic diverticulitis    History of kidney stones    History of renal cell carcinoma    a. s/p laser ablation    Hyperlipidemia    Hypertension    Moderate aortic stenosis    a. 07/2019 Echo: Mod AS.   Near syncope    a. 3 separate events in 2017   Nephrolithiasis    Panic attacks    Peripheral vascular disease ([place])    Permanent atrial fibrillation ([place]) 06/06/2016   a. Dx 06/06/2016; b. CHADS2VASc => 5 (CHF, HTN, age x 1, DM, vascular disease)-->was on Eliquis but d/c'd 2/2 anemia and hemoptysis.   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[place])     Past Surgical History:  Procedure Laterality  Date   CARDIAC CATHETERIZATION     CHOLECYSTECTOMY     COLONOSCOPY N/A 06/03/2015   Procedure: COLONOSCOPY;  Surgeon: Lucilla Lame, MD;  Location: [place];  Service: Gastroenterology;  Laterality: N/A;   CORONARY ARTERY BYPASS GRAFT  2004   CABG x 3   EYE SURGERY     HERNIA REPAIR     x 2   HIP SURGERY     right hip replacement   INTRAOCULAR LENS INSERTION     KIDNEY SURGERY     cancer   NOSE SURGERY     PACEMAKER IMPLANT N/A 04/05/2018   SJM Biventricular pacemaker implanted by Dr Rayann Heman for second degree AV block, EF < 50% and permanent afib   POLYPECTOMY  06/03/2015   Procedure: POLYPECTOMY;  Surgeon: Lucilla Lame, MD;  Location: [place];  Service: Gastroenterology;;   PROSTATE SURGERY     TOTAL HIP ARTHROPLASTY Left 12/19/2016   Procedure: LEFT TOTAL HIP ARTHROPLASTY;  Surgeon: Garald Balding, MD;  Location: [place];  Service: Orthopedics;  Laterality: Left;    MEDICATIONS: No current facility-administered medications for this encounter.    acetaminophen (TYLENOL) 325 MG tablet   clonazePAM (KLONOPIN) 1 MG tablet   donepezil (ARICEPT) 5 MG tablet   famotidine (PEPCID) 20 MG tablet   fluticasone (FLONASE) 50 MCG/ACT nasal spray   furosemide (LASIX) 40 MG tablet   losartan (COZAAR) 25 MG tablet   meloxicam (MOBIC) 15 MG tablet   NON FORMULARY   venlafaxine XR (EFFEXOR-XR) 37.5 MG 24 hr capsule   apixaban (ELIQUIS) 5 MG TABS tablet   torsemide (DEMADEX) 20 MG tablet    Myra Gianotti, PA-C Surgical Short Stay/Anesthesiology [facility] Phone [phone] [facility] Phone [phone] 06/13/2021 2:32 PM

## 2021-06-13 NOTE — Telephone Encounter (Signed)
   Name: Jesus Hendrix  DOB: 09/02/1946  MRN: [id number]   Primary Cardiologist: Ida Rogue, MD  Chart reviewed as part of pre-operative protocol coverage.   Per our clinical pharmacist: Patient with diagnosis of afib on Eliquis for anticoagulation.     Procedure: VP SHUNT PLACEMENT Date of procedure: 06/14/21   CHA2DS2-VASc Score = 5  This indicates a 7.2% annual risk of stroke. The patient's score is based upon: CHF History: Yes HTN History: Yes Diabetes History: No Stroke History: No Vascular Disease History: Yes Age Score: 2 Gender Score: 0    CrCl 35 ml/min Platelet count 251   Per office protocol, patient can hold Eliquis for 3 days prior to procedure  I will route this recommendation to the requesting party via [facility] fax function and remove from pre-op pool. Please call with questions.  [place], PA 06/13/2021, 9:04 AM

## 2021-06-14 ENCOUNTER — Telehealth: Payer: Self-pay | Admitting: Cardiovascular Disease

## 2021-06-14 SURGERY — SHUNT INSERTION VENTRICULAR-PERITONEAL
Anesthesia: General | Laterality: Right

## 2021-06-14 NOTE — Telephone Encounter (Signed)
Patients daughter calling in to make Dr.Gollan aware that the Shunt procedure was cancelled due to patient testing positive for covid. Patient is coughing a lot and cautious about potential fluid retention in feet/calves and felt that Dr.Gollan should know. Daughter states that the shunt procedure will be rescheduled for 2-3 weeks from now  Daughter will also be calling PCP office to make them aware

## 2021-06-14 NOTE — Progress Notes (Addendum)
Dr. Annette Stable aware of patient's covid test results.    Spoke with patient's wife - wife informed nurse that she was positive for covid last week.  Wife stated husband started coughing 2-3 days ago and seems to be more tired.    Dr. Annette Stable updated and stated that case will need to be rescheduled.  OR desk aware.

## 2021-06-21 ENCOUNTER — Ambulatory Visit (INDEPENDENT_AMBULATORY_CARE_PROVIDER_SITE_OTHER): Payer: Medicare Other

## 2021-06-21 DIAGNOSIS — I5042 Chronic combined systolic (congestive) and diastolic (congestive) heart failure: Secondary | ICD-10-CM

## 2021-06-21 DIAGNOSIS — Z950 Presence of cardiac pacemaker: Secondary | ICD-10-CM

## 2021-06-22 NOTE — Progress Notes (Signed)
EPIC Encounter for ICM Monitoring  Patient Name: Jesus Hendrix is a 75 y.o. male Date: 06/22/2021 Primary Care Physican: Maryland Pink, MD Primary Cardiologist: Rockey Situ Electrophysiologist: Allred 04/15/2021 Office Weight: 235 lbs         Transmission reviewed.    CorVue Thoracic impedance normal.   Prescribed: Torsemide 20 mg take 1 tablet twice a day.   Labs: 01/11/2021 Creatinine 2.11, BUN 39, Potassium 4.4, Sodium 140, GFR 30-34 A complete set of results can be found in Results Review.   Recommendations:  No changes.   Follow-up plan: ICM clinic phone appointment on 07/25/2021. 91 day device clinic remote transmission 07/01/2021.       EP/Cardiology Office Visits:  08/16/2021 with Oda Kilts, PA.  Recall 07/10/2021 with Dr Rockey Situ.   Copy of ICM check sent to Dr. Rayann Heman.   3 month ICM trend: 06/21/2021.    1 Year ICM trend:       [name], RN 06/22/2021 11:15 AM

## 2021-06-28 ENCOUNTER — Ambulatory Visit: Payer: Medicare Other | Admitting: Physical Therapy

## 2021-06-30 ENCOUNTER — Ambulatory Visit: Payer: Medicare Other | Admitting: Physical Therapy

## 2021-07-01 ENCOUNTER — Ambulatory Visit (INDEPENDENT_AMBULATORY_CARE_PROVIDER_SITE_OTHER): Payer: Medicare Other

## 2021-07-01 DIAGNOSIS — I442 Atrioventricular block, complete: Secondary | ICD-10-CM | POA: Diagnosis not present

## 2021-07-05 LAB — CUP PACEART REMOTE DEVICE CHECK
Battery Remaining Longevity: 72 mo
Battery Remaining Percentage: 70 %
Battery Voltage: 2.99 V
Date Time Interrogation Session: 20220715034535
Implantable Lead Implant Date: 20190419
Implantable Lead Implant Date: 20190419
Implantable Lead Location: 753858
Implantable Lead Location: 753860
Implantable Pulse Generator Implant Date: 20190419
Lead Channel Impedance Value: 430 Ohm
Lead Channel Impedance Value: 750 Ohm
Lead Channel Pacing Threshold Amplitude: 0.75 V
Lead Channel Pacing Threshold Amplitude: 1 V
Lead Channel Pacing Threshold Pulse Width: 0.5 ms
Lead Channel Pacing Threshold Pulse Width: 0.5 ms
Lead Channel Sensing Intrinsic Amplitude: 12 mV
Lead Channel Setting Pacing Amplitude: 2 V
Lead Channel Setting Pacing Amplitude: 2.5 V
Lead Channel Setting Pacing Pulse Width: 0.5 ms
Lead Channel Setting Pacing Pulse Width: 0.5 ms
Lead Channel Setting Sensing Sensitivity: 4 mV
Pulse Gen Model: 3262
Pulse Gen Serial Number: 9017264

## 2021-07-06 NOTE — Progress Notes (Signed)
Left a message for Windle Guard from [facility] regarding this pt's rescheduled surgery for Fri 07/08/21 at 1021. Device orders also sent Dr. Lenna Sciara. Rayann Heman.

## 2021-07-07 ENCOUNTER — Other Ambulatory Visit: Payer: Self-pay | Admitting: Neurosurgery

## 2021-07-07 ENCOUNTER — Other Ambulatory Visit: Payer: Self-pay

## 2021-07-07 ENCOUNTER — Encounter (HOSPITAL_COMMUNITY): Payer: Self-pay | Admitting: Neurosurgery

## 2021-07-07 NOTE — Anesthesia Preprocedure Evaluation (Addendum)
Anesthesia Evaluation  Patient identified by MRN, date of birth, ID band Patient awake and Patient confused    Reviewed: Allergy & Precautions, NPO status , Patient's Chart, lab work & pertinent test results  History of Anesthesia Complications Negative for: history of anesthetic complications  Airway Mallampati: III  TM Distance: >3 FB Neck ROM: Full    Dental no notable dental hx.    Pulmonary COPD, former smoker,    Pulmonary exam normal        Cardiovascular hypertension, Pt. on medications + CAD, + CABG (2007), + Peripheral Vascular Disease and +CHF  Normal cardiovascular exam+ dysrhythmias (on Eliquis) Atrial Fibrillation + pacemaker   TTE 01/2021: EF 30-35%, global hypokinesis, RV systolic function moderately reduced, moderately elevated PASP, (moderate AS on 2020 TTE)   Neuro/Psych Anxiety Hrdrocephalus, spinal stenosis    GI/Hepatic Neg liver ROS, hiatal hernia, GERD  Controlled,  Endo/Other  negative endocrine ROS  Renal/GU Renal InsufficiencyRenal diseaseWegener's granulomatosis   negative genitourinary   Musculoskeletal negative musculoskeletal ROS (+)   Abdominal   Peds  Hematology negative hematology ROS (+)   Anesthesia Other Findings Day of surgery medications reviewed with patient.  Reproductive/Obstetrics negative OB ROS                            Anesthesia Physical Anesthesia Plan  ASA: 3  Anesthesia Plan: General   Post-op Pain Management:    Induction: Intravenous  PONV Risk Score and Plan: 2 and Treatment may vary due to age or medical condition, Ondansetron and Dexamethasone  Airway Management Planned: Oral ETT  Additional Equipment: Arterial line  Intra-op Plan:   Post-operative Plan: Extubation in OR  Informed Consent: I have reviewed the patients History and Physical, chart, labs and discussed the procedure including the risks, benefits and  alternatives for the proposed anesthesia with the patient or authorized representative who has indicated his/her understanding and acceptance.     Dental advisory given and Consent reviewed with POA  Plan Discussed with: CRNA  Anesthesia Plan Comments: (Consent reviewed with patient's wife at [place], MD)       Anesthesia Quick Evaluation

## 2021-07-07 NOTE — Progress Notes (Addendum)
I spoke to Mr. Dusing wife, Kermit Balo; Kermit Balo states that she answers questions because patient has some memory issues, possibly causes by fluid on his brain.  Kermit Balo reports that family do not have              s/s of Covid and to their knowledge have not been exposed to anyone with Covid. Mr. Doolen and his wife were positive for in June, patient had  mild to no symptoms at that time.  I instructed patient to shower with antibiotic soap, if it is available.  Dry off with a clean towel. Do not put lotion, powder, cologne or deodorant or makeup.No jewelry or piercings. Man may shave their face and neck. Wear clean clothes, brush your teeth. Glasses, contact lens,dentures or partials may not be worn in the OR. If you need to wear them, please bring a case for glasses, do not wear contacts or bring a case, the hospital does not have contact cases, dentures or partials will have to be removed , make sure they are clean, we will provide a denture cup to put them in. =  .

## 2021-07-08 ENCOUNTER — Encounter (HOSPITAL_COMMUNITY)
Admission: RE | Disposition: A | Discharge status: Home Health | Payer: Self-pay | Source: Home / Self Care | Attending: Neurosurgery

## 2021-07-08 ENCOUNTER — Inpatient Hospital Stay (HOSPITAL_COMMUNITY): Payer: Medicare Other | Admitting: Physician Assistant

## 2021-07-08 ENCOUNTER — Inpatient Hospital Stay (HOSPITAL_COMMUNITY)
Admission: RE | Admit: 2021-07-08 | Discharge: 2021-07-09 | DRG: 032 | Disposition: A | Discharge status: Home Health | Payer: Medicare Other | Attending: Neurosurgery | Admitting: Neurosurgery

## 2021-07-08 ENCOUNTER — Inpatient Hospital Stay (HOSPITAL_COMMUNITY): Admission: RE | Admit: 2021-07-08 | Payer: Medicare Other | Source: Home / Self Care | Admitting: Neurosurgery

## 2021-07-08 ENCOUNTER — Encounter (HOSPITAL_COMMUNITY): Payer: Self-pay | Admitting: Neurosurgery

## 2021-07-08 DIAGNOSIS — R339 Retention of urine, unspecified: Secondary | ICD-10-CM | POA: Diagnosis not present

## 2021-07-08 DIAGNOSIS — N184 Chronic kidney disease, stage 4 (severe): Secondary | ICD-10-CM | POA: Diagnosis present

## 2021-07-08 DIAGNOSIS — Z79899 Other long term (current) drug therapy: Secondary | ICD-10-CM

## 2021-07-08 DIAGNOSIS — J449 Chronic obstructive pulmonary disease, unspecified: Secondary | ICD-10-CM | POA: Diagnosis not present

## 2021-07-08 DIAGNOSIS — Z8249 Family history of ischemic heart disease and other diseases of the circulatory system: Secondary | ICD-10-CM

## 2021-07-08 DIAGNOSIS — Z951 Presence of aortocoronary bypass graft: Secondary | ICD-10-CM

## 2021-07-08 DIAGNOSIS — I441 Atrioventricular block, second degree: Secondary | ICD-10-CM | POA: Diagnosis not present

## 2021-07-08 DIAGNOSIS — E1151 Type 2 diabetes mellitus with diabetic peripheral angiopathy without gangrene: Secondary | ICD-10-CM | POA: Diagnosis present

## 2021-07-08 DIAGNOSIS — I130 Hypertensive heart and chronic kidney disease with heart failure and stage 1 through stage 4 chronic kidney disease, or unspecified chronic kidney disease: Secondary | ICD-10-CM | POA: Diagnosis present

## 2021-07-08 DIAGNOSIS — Z791 Long term (current) use of non-steroidal anti-inflammatories (NSAID): Secondary | ICD-10-CM

## 2021-07-08 DIAGNOSIS — Z96643 Presence of artificial hip joint, bilateral: Secondary | ICD-10-CM | POA: Diagnosis present

## 2021-07-08 DIAGNOSIS — Z950 Presence of cardiac pacemaker: Secondary | ICD-10-CM

## 2021-07-08 DIAGNOSIS — I2510 Atherosclerotic heart disease of native coronary artery without angina pectoris: Secondary | ICD-10-CM | POA: Diagnosis present

## 2021-07-08 DIAGNOSIS — Z87891 Personal history of nicotine dependence: Secondary | ICD-10-CM | POA: Diagnosis not present

## 2021-07-08 DIAGNOSIS — E1122 Type 2 diabetes mellitus with diabetic chronic kidney disease: Secondary | ICD-10-CM | POA: Diagnosis present

## 2021-07-08 DIAGNOSIS — E785 Hyperlipidemia, unspecified: Secondary | ICD-10-CM | POA: Diagnosis not present

## 2021-07-08 DIAGNOSIS — G910 Communicating hydrocephalus: Secondary | ICD-10-CM | POA: Diagnosis not present

## 2021-07-08 DIAGNOSIS — I5022 Chronic systolic (congestive) heart failure: Secondary | ICD-10-CM | POA: Diagnosis not present

## 2021-07-08 DIAGNOSIS — Z7901 Long term (current) use of anticoagulants: Secondary | ICD-10-CM | POA: Diagnosis not present

## 2021-07-08 DIAGNOSIS — I447 Left bundle-branch block, unspecified: Secondary | ICD-10-CM | POA: Diagnosis not present

## 2021-07-08 DIAGNOSIS — Z888 Allergy status to other drugs, medicaments and biological substances status: Secondary | ICD-10-CM

## 2021-07-08 DIAGNOSIS — K219 Gastro-esophageal reflux disease without esophagitis: Secondary | ICD-10-CM | POA: Diagnosis present

## 2021-07-08 DIAGNOSIS — I350 Nonrheumatic aortic (valve) stenosis: Secondary | ICD-10-CM | POA: Diagnosis not present

## 2021-07-08 DIAGNOSIS — M48062 Spinal stenosis, lumbar region with neurogenic claudication: Secondary | ICD-10-CM | POA: Diagnosis not present

## 2021-07-08 DIAGNOSIS — I4821 Permanent atrial fibrillation: Secondary | ICD-10-CM | POA: Diagnosis present

## 2021-07-08 DIAGNOSIS — Z85528 Personal history of other malignant neoplasm of kidney: Secondary | ICD-10-CM

## 2021-07-08 HISTORY — PX: VENTRICULOPERITONEAL SHUNT: SHX204

## 2021-07-08 HISTORY — DX: Personal history of urinary calculi: Z87.442

## 2021-07-08 HISTORY — DX: Presence of cardiac pacemaker: Z95.0

## 2021-07-08 HISTORY — DX: Peripheral vascular disease, unspecified: I73.9

## 2021-07-08 HISTORY — DX: Other amnesia: R41.3

## 2021-07-08 LAB — BASIC METABOLIC PANEL
Anion gap: 9 (ref 5–15)
BUN: 35 mg/dL — ABNORMAL HIGH (ref 8–23)
CO2: 28 mmol/L (ref 22–32)
Calcium: 9 mg/dL (ref 8.9–10.3)
Chloride: 100 mmol/L (ref 98–111)
Creatinine, Ser: 2.32 mg/dL — ABNORMAL HIGH (ref 0.61–1.24)
GFR, Estimated: 28 mL/min — ABNORMAL LOW (ref >60)
Glucose, Bld: 112 mg/dL — ABNORMAL HIGH (ref 70–99)
Potassium: 4.1 mmol/L (ref 3.5–5.1)
Sodium: 137 mmol/L (ref 135–145)

## 2021-07-08 LAB — CBC
HCT: 34.1 % — ABNORMAL LOW (ref 39.0–52.0)
Hemoglobin: 11.6 g/dL — ABNORMAL LOW (ref 13.0–17.0)
MCH: 34.3 pg — ABNORMAL HIGH (ref 26.0–34.0)
MCHC: 34 g/dL (ref 30.0–36.0)
MCV: 100.9 fL — ABNORMAL HIGH (ref 80.0–100.0)
Platelets: 283 10*3/uL (ref 150–400)
RBC: 3.38 MIL/uL — ABNORMAL LOW (ref 4.22–5.81)
RDW: 14.9 % (ref 11.5–15.5)
WBC: 8.3 10*3/uL (ref 4.0–10.5)
nRBC: 0.2 % (ref 0.0–0.2)

## 2021-07-08 SURGERY — SHUNT INSERTION VENTRICULAR-PERITONEAL
Anesthesia: General | Laterality: Right

## 2021-07-08 MED ORDER — DEXAMETHASONE SODIUM PHOSPHATE 10 MG/ML IJ SOLN
INTRAMUSCULAR | Status: DC | PRN
  Administered 2021-07-08: 10 mg via INTRAVENOUS

## 2021-07-08 MED ORDER — FENTANYL CITRATE (PF) 250 MCG/5ML IJ SOLN
INTRAMUSCULAR | Status: AC
  Filled 2021-07-08: qty 5

## 2021-07-08 MED ORDER — SODIUM CHLORIDE 0.9 % IV SOLN
250.0000 mL | INTRAVENOUS | Status: DC
  Administered 2021-07-08: 250 mL via INTRAVENOUS

## 2021-07-08 MED ORDER — PROMETHAZINE HCL 25 MG/ML IJ SOLN: 6.2500 mg | INTRAMUSCULAR | Status: DC | PRN

## 2021-07-08 MED ORDER — ROCURONIUM BROMIDE 10 MG/ML (PF) SYRINGE
PREFILLED_SYRINGE | INTRAVENOUS | Status: AC
  Filled 2021-07-08: qty 10

## 2021-07-08 MED ORDER — OXYCODONE HCL 5 MG PO TABS: 5.0000 mg | ORAL_TABLET | Freq: Once | ORAL | Status: DC | PRN

## 2021-07-08 MED ORDER — CHLORHEXIDINE GLUCONATE CLOTH 2 % EX PADS: 6.0000 | MEDICATED_PAD | Freq: Once | CUTANEOUS | Status: DC

## 2021-07-08 MED ORDER — ACETAMINOPHEN 325 MG PO TABS: 650.0000 mg | ORAL_TABLET | ORAL | Status: DC | PRN

## 2021-07-08 MED ORDER — 0.9 % SODIUM CHLORIDE (POUR BTL) OPTIME
TOPICAL | Status: DC | PRN
  Administered 2021-07-08: 1000 mL

## 2021-07-08 MED ORDER — CLONAZEPAM 0.5 MG PO TABS
1.0000 mg | ORAL_TABLET | Freq: Every day | ORAL | Status: DC
  Administered 2021-07-08: 1 mg via ORAL
  Filled 2021-07-08: qty 2

## 2021-07-08 MED ORDER — FLUTICASONE PROPIONATE 50 MCG/ACT NA SUSP: 1.0000 | Freq: Every day | NASAL | Status: DC | PRN

## 2021-07-08 MED ORDER — SODIUM CHLORIDE 0.9% FLUSH: 3.0000 mL | INTRAVENOUS | Status: DC | PRN

## 2021-07-08 MED ORDER — FAMOTIDINE 20 MG PO TABS
20.0000 mg | ORAL_TABLET | Freq: Every day | ORAL | Status: DC | PRN
  Administered 2021-07-09: 20 mg via ORAL
  Filled 2021-07-08: qty 1

## 2021-07-08 MED ORDER — ONDANSETRON HCL 4 MG/2ML IJ SOLN
INTRAMUSCULAR | Status: DC | PRN
  Administered 2021-07-08: 4 mg via INTRAVENOUS

## 2021-07-08 MED ORDER — ORAL CARE MOUTH RINSE: 15.0000 mL | Freq: Once | OROMUCOSAL | Status: AC

## 2021-07-08 MED ORDER — CEFAZOLIN SODIUM-DEXTROSE 1-4 GM/50ML-% IV SOLN
1.0000 g | Freq: Three times a day (TID) | INTRAVENOUS | Status: AC
  Administered 2021-07-08 – 2021-07-09 (×2): 1 g via INTRAVENOUS
  Filled 2021-07-08 (×2): qty 50

## 2021-07-08 MED ORDER — LIDOCAINE 2% (20 MG/ML) 5 ML SYRINGE
INTRAMUSCULAR | Status: AC
  Filled 2021-07-08: qty 15

## 2021-07-08 MED ORDER — CYCLOBENZAPRINE HCL 10 MG PO TABS: 10.0000 mg | ORAL_TABLET | Freq: Three times a day (TID) | ORAL | Status: DC | PRN

## 2021-07-08 MED ORDER — HEMOSTATIC AGENTS (NO CHARGE) OPTIME
TOPICAL | Status: DC | PRN
  Administered 2021-07-08: 1 via TOPICAL

## 2021-07-08 MED ORDER — FENTANYL CITRATE (PF) 250 MCG/5ML IJ SOLN
INTRAMUSCULAR | Status: DC | PRN
  Administered 2021-07-08: 50 ug via INTRAVENOUS
  Administered 2021-07-08: 100 ug via INTRAVENOUS

## 2021-07-08 MED ORDER — PHENYLEPHRINE 40 MCG/ML (10ML) SYRINGE FOR IV PUSH (FOR BLOOD PRESSURE SUPPORT)
PREFILLED_SYRINGE | INTRAVENOUS | Status: DC | PRN
  Administered 2021-07-08: 120 ug via INTRAVENOUS

## 2021-07-08 MED ORDER — DONEPEZIL HCL 5 MG PO TABS
5.0000 mg | ORAL_TABLET | Freq: Every day | ORAL | Status: DC
  Administered 2021-07-08: 5 mg via ORAL
  Filled 2021-07-08: qty 1

## 2021-07-08 MED ORDER — TORSEMIDE 20 MG PO TABS
20.0000 mg | ORAL_TABLET | Freq: Two times a day (BID) | ORAL | Status: DC
  Administered 2021-07-08 – 2021-07-09 (×2): 20 mg via ORAL
  Filled 2021-07-08 (×2): qty 1

## 2021-07-08 MED ORDER — ROCURONIUM BROMIDE 10 MG/ML (PF) SYRINGE
PREFILLED_SYRINGE | INTRAVENOUS | Status: DC | PRN
  Administered 2021-07-08: 60 mg via INTRAVENOUS
  Administered 2021-07-08: 20 mg via INTRAVENOUS

## 2021-07-08 MED ORDER — BACITRACIN ZINC 500 UNIT/GM EX OINT
TOPICAL_OINTMENT | CUTANEOUS | Status: AC
  Filled 2021-07-08: qty 28.35

## 2021-07-08 MED ORDER — FENTANYL CITRATE (PF) 100 MCG/2ML IJ SOLN
INTRAMUSCULAR | Status: AC
  Filled 2021-07-08: qty 2

## 2021-07-08 MED ORDER — OXYCODONE HCL 5 MG/5ML PO SOLN
5.0000 mg | Freq: Once | ORAL | Status: DC | PRN
Start: 2021-07-08 — End: 2021-07-08

## 2021-07-08 MED ORDER — DEXAMETHASONE SODIUM PHOSPHATE 10 MG/ML IJ SOLN
10.0000 mg | Freq: Once | INTRAMUSCULAR | Status: DC
  Filled 2021-07-08: qty 1

## 2021-07-08 MED ORDER — DEXAMETHASONE SODIUM PHOSPHATE 10 MG/ML IJ SOLN
INTRAMUSCULAR | Status: AC
  Filled 2021-07-08: qty 2

## 2021-07-08 MED ORDER — LACTATED RINGERS IV SOLN: INTRAVENOUS | Status: DC

## 2021-07-08 MED ORDER — PROPOFOL 10 MG/ML IV BOLUS
INTRAVENOUS | Status: DC | PRN
  Administered 2021-07-08: 120 mg via INTRAVENOUS

## 2021-07-08 MED ORDER — CHLORHEXIDINE GLUCONATE 0.12 % MT SOLN: 15.0000 mL | Freq: Once | OROMUCOSAL | Status: AC

## 2021-07-08 MED ORDER — PHENYLEPHRINE 40 MCG/ML (10ML) SYRINGE FOR IV PUSH (FOR BLOOD PRESSURE SUPPORT)
PREFILLED_SYRINGE | INTRAVENOUS | Status: AC
  Filled 2021-07-08: qty 10

## 2021-07-08 MED ORDER — HYDROCODONE-ACETAMINOPHEN 5-325 MG PO TABS: 1.0000 | ORAL_TABLET | ORAL | Status: DC | PRN

## 2021-07-08 MED ORDER — CEFAZOLIN SODIUM-DEXTROSE 2-4 GM/100ML-% IV SOLN
2.0000 g | INTRAVENOUS | Status: AC
  Administered 2021-07-08: 2 g via INTRAVENOUS
  Filled 2021-07-08: qty 100

## 2021-07-08 MED ORDER — VENLAFAXINE HCL ER 37.5 MG PO CP24
37.5000 mg | ORAL_CAPSULE | Freq: Every day | ORAL | Status: DC
  Administered 2021-07-09: 37.5 mg via ORAL
  Filled 2021-07-08: qty 1

## 2021-07-08 MED ORDER — LIDOCAINE 2% (20 MG/ML) 5 ML SYRINGE
INTRAMUSCULAR | Status: DC | PRN
  Administered 2021-07-08: 100 mg via INTRAVENOUS

## 2021-07-08 MED ORDER — FENTANYL CITRATE (PF) 100 MCG/2ML IJ SOLN
25.0000 ug | INTRAMUSCULAR | Status: DC | PRN
  Administered 2021-07-08 (×2): 50 ug via INTRAVENOUS

## 2021-07-08 MED ORDER — ACETAMINOPHEN 500 MG PO TABS
1000.0000 mg | ORAL_TABLET | Freq: Once | ORAL | Status: AC
  Administered 2021-07-08: 1000 mg via ORAL
  Filled 2021-07-08: qty 2

## 2021-07-08 MED ORDER — ACETAMINOPHEN 650 MG RE SUPP: 650.0000 mg | RECTAL | Status: DC | PRN

## 2021-07-08 MED ORDER — PHENOL 1.4 % MT LIQD: 1.0000 | OROMUCOSAL | Status: DC | PRN

## 2021-07-08 MED ORDER — ONDANSETRON HCL 4 MG/2ML IJ SOLN: 4.0000 mg | Freq: Four times a day (QID) | INTRAMUSCULAR | Status: DC | PRN

## 2021-07-08 MED ORDER — PROPOFOL 10 MG/ML IV BOLUS
INTRAVENOUS | Status: AC
  Filled 2021-07-08: qty 20

## 2021-07-08 MED ORDER — THROMBIN 5000 UNITS EX SOLR
CUTANEOUS | Status: AC
  Filled 2021-07-08: qty 10000

## 2021-07-08 MED ORDER — CEFAZOLIN SODIUM 1 G IJ SOLR
INTRAMUSCULAR | Status: AC
  Filled 2021-07-08: qty 20

## 2021-07-08 MED ORDER — THROMBIN 5000 UNITS EX SOLR
CUTANEOUS | Status: DC | PRN
  Administered 2021-07-08: 10000 [IU] via TOPICAL

## 2021-07-08 MED ORDER — ONDANSETRON HCL 4 MG/2ML IJ SOLN
INTRAMUSCULAR | Status: AC
  Filled 2021-07-08: qty 4

## 2021-07-08 MED ORDER — SODIUM CHLORIDE 0.9% FLUSH
3.0000 mL | Freq: Two times a day (BID) | INTRAVENOUS | Status: DC
  Administered 2021-07-08 (×2): 3 mL via INTRAVENOUS

## 2021-07-08 MED ORDER — SUGAMMADEX SODIUM 200 MG/2ML IV SOLN
INTRAVENOUS | Status: DC | PRN
Start: 2021-07-08 — End: 2021-07-08
  Administered 2021-07-08: 200 mg via INTRAVENOUS

## 2021-07-08 MED ORDER — ONDANSETRON HCL 4 MG PO TABS: 4.0000 mg | ORAL_TABLET | Freq: Four times a day (QID) | ORAL | Status: DC | PRN

## 2021-07-08 MED ORDER — HYDROMORPHONE HCL 1 MG/ML IJ SOLN: 1.0000 mg | INTRAMUSCULAR | Status: DC | PRN

## 2021-07-08 MED ORDER — MENTHOL 3 MG MT LOZG: 1.0000 | LOZENGE | OROMUCOSAL | Status: DC | PRN

## 2021-07-08 MED ORDER — PHENYLEPHRINE HCL-NACL 10-0.9 MG/250ML-% IV SOLN
INTRAVENOUS | Status: DC | PRN
  Administered 2021-07-08: 25 ug/min via INTRAVENOUS

## 2021-07-08 MED ORDER — KETOROLAC TROMETHAMINE 30 MG/ML IJ SOLN
INTRAMUSCULAR | Status: AC
  Filled 2021-07-08: qty 1

## 2021-07-08 MED ORDER — CHLORHEXIDINE GLUCONATE 0.12 % MT SOLN
OROMUCOSAL | Status: AC
  Administered 2021-07-08: 15 mL via OROMUCOSAL
  Filled 2021-07-08: qty 15

## 2021-07-08 MED ORDER — HYDROCODONE-ACETAMINOPHEN 10-325 MG PO TABS
1.0000 | ORAL_TABLET | ORAL | Status: DC | PRN
  Administered 2021-07-08 (×3): 1 via ORAL
  Filled 2021-07-08 (×3): qty 1

## 2021-07-08 MED ORDER — ESMOLOL HCL 100 MG/10ML IV SOLN
INTRAVENOUS | Status: DC | PRN
  Administered 2021-07-08: 20 mg via INTRAVENOUS

## 2021-07-08 MED ORDER — LIDOCAINE-EPINEPHRINE 1 %-1:100000 IJ SOLN
INTRAMUSCULAR | Status: AC
  Filled 2021-07-08: qty 1

## 2021-07-08 MED ORDER — LOSARTAN POTASSIUM 50 MG PO TABS
25.0000 mg | ORAL_TABLET | Freq: Every day | ORAL | Status: DC
  Administered 2021-07-08: 25 mg via ORAL
  Filled 2021-07-08: qty 1

## 2021-07-08 SURGICAL SUPPLY — 72 items
ADH SKN CLS APL DERMABOND .7 (GAUZE/BANDAGES/DRESSINGS) ×1
APL SKNCLS STERI-STRIP NONHPOA (GAUZE/BANDAGES/DRESSINGS) ×1
BAG COUNTER SPONGE SURGICOUNT (BAG) ×2 IMPLANT
BAG DECANTER FOR FLEXI CONT (MISCELLANEOUS) ×1 IMPLANT
BAG SPNG CNTER NS LX DISP (BAG) ×1
BAND INSRT 18 STRL LF DISP RB (MISCELLANEOUS)
BAND RUBBER #18 3X1/16 STRL (MISCELLANEOUS) IMPLANT
BENZOIN TINCTURE PRP APPL 2/3 (GAUZE/BANDAGES/DRESSINGS) ×2 IMPLANT
BLADE SURG 11 STRL SS (BLADE) ×2 IMPLANT
BNDG ADH 1X3 SHEER STRL LF (GAUZE/BANDAGES/DRESSINGS) ×6 IMPLANT
BNDG ADH THN 3X1 STRL LF (GAUZE/BANDAGES/DRESSINGS) ×3
BNDG GAUZE ELAST 4 BULKY (GAUZE/BANDAGES/DRESSINGS) IMPLANT
BUR ACORN 6.0 PRECISION (BURR) ×2 IMPLANT
CANISTER SUCT 3000ML PPV (MISCELLANEOUS) ×2 IMPLANT
CARTRIDGE OIL MAESTRO DRILL (MISCELLANEOUS) ×1 IMPLANT
CATH COUDE FOLEY 2W 5CC 16FR (CATHETERS) IMPLANT
CATH VENTRICULAR 9CM (Shunt) ×1 IMPLANT
CLIP RANEY DISP (INSTRUMENTS) IMPLANT
DERMABOND ADVANCED (GAUZE/BANDAGES/DRESSINGS) ×1
DERMABOND ADVANCED .7 DNX12 (GAUZE/BANDAGES/DRESSINGS) ×1 IMPLANT
DIFFUSER DRILL AIR PNEUMATIC (MISCELLANEOUS) ×2 IMPLANT
DRAPE INCISE IOBAN 85X60 (DRAPES) ×2 IMPLANT
DRAPE ORTHO SPLIT 77X108 STRL (DRAPES) ×2
DRAPE SURG 17X23 STRL (DRAPES) IMPLANT
DRAPE SURG ORHT 6 SPLT 77X108 (DRAPES) ×2 IMPLANT
DRSG OPSITE 4X5.5 SM (GAUZE/BANDAGES/DRESSINGS) ×4 IMPLANT
DRSG OPSITE POSTOP 3X4 (GAUZE/BANDAGES/DRESSINGS) ×2 IMPLANT
ELECT REM PT RETURN 9FT ADLT (ELECTROSURGICAL) ×2
ELECTRODE REM PT RTRN 9FT ADLT (ELECTROSURGICAL) ×1 IMPLANT
GAUZE 4X4 16PLY ~~LOC~~+RFID DBL (SPONGE) ×1 IMPLANT
GLOVE EXAM NITRILE XL STR (GLOVE) IMPLANT
GLOVE SURG LTX SZ9 (GLOVE) ×2 IMPLANT
GOWN STRL REUS W/ TWL LRG LVL3 (GOWN DISPOSABLE) IMPLANT
GOWN STRL REUS W/ TWL XL LVL3 (GOWN DISPOSABLE) IMPLANT
GOWN STRL REUS W/TWL 2XL LVL3 (GOWN DISPOSABLE) IMPLANT
GOWN STRL REUS W/TWL LRG LVL3 (GOWN DISPOSABLE)
GOWN STRL REUS W/TWL XL LVL3 (GOWN DISPOSABLE)
HEMOSTAT SURGICEL 2X14 (HEMOSTASIS) IMPLANT
KIT BASIN OR (CUSTOM PROCEDURE TRAY) ×2 IMPLANT
KIT TURNOVER KIT B (KITS) ×2 IMPLANT
MARKER SKIN DUAL TIP RULER LAB (MISCELLANEOUS) ×2 IMPLANT
NS IRRIG 1000ML POUR BTL (IV SOLUTION) ×2 IMPLANT
OIL CARTRIDGE MAESTRO DRILL (MISCELLANEOUS) ×2
PACK LAMINECTOMY NEURO (CUSTOM PROCEDURE TRAY) ×2 IMPLANT
PAD ARMBOARD 7.5X6 YLW CONV (MISCELLANEOUS) ×2 IMPLANT
SHEATH PERITONEAL INTRO 46 (SHEATH) IMPLANT
SHEATH PERITONEAL INTRO 61 (SHEATH) IMPLANT
SHUNT STRATA 11 SNAP REG (Shunt) ×1 IMPLANT
SPONGE INTESTINAL PEANUT (DISPOSABLE) IMPLANT
SPONGE SURGIFOAM ABS GEL SZ50 (HEMOSTASIS) ×1 IMPLANT
SPONGE T-LAP 4X18 ~~LOC~~+RFID (SPONGE) IMPLANT
STAPLER VISISTAT 35W (STAPLE) ×2 IMPLANT
STRIP CLOSURE SKIN 1/2X4 (GAUZE/BANDAGES/DRESSINGS) ×4 IMPLANT
SUT CHROMIC 3 0 SH 27 (SUTURE) IMPLANT
SUT ETHILON 3 0 FSL (SUTURE) IMPLANT
SUT ETHILON 4 0 PS 2 18 (SUTURE) IMPLANT
SUT NURALON 4 0 TR CR/8 (SUTURE) IMPLANT
SUT SILK 0 TIES 10X30 (SUTURE) IMPLANT
SUT SILK 2 0 TIES 17X18 (SUTURE)
SUT SILK 2-0 18XBRD TIE BLK (SUTURE) ×1 IMPLANT
SUT SILK 3 0 SH 30 (SUTURE) IMPLANT
SUT VIC AB 2-0 CT1 18 (SUTURE) ×1 IMPLANT
SUT VIC AB 2-0 CT2 18 VCP726D (SUTURE) ×1 IMPLANT
SUT VIC AB 3-0 SH 8-18 (SUTURE) ×3 IMPLANT
SUT VICRYL 4-0 PS2 18IN ABS (SUTURE) IMPLANT
SYR 5ML LL (SYRINGE) IMPLANT
SYR CONTROL 10ML LL (SYRINGE) ×1 IMPLANT
TOWEL GREEN STERILE (TOWEL DISPOSABLE) ×2 IMPLANT
TOWEL GREEN STERILE FF (TOWEL DISPOSABLE) ×2 IMPLANT
TRAY FOLEY MTR SLVR 16FR STAT (SET/KITS/TRAYS/PACK) IMPLANT
UNDERPAD 30X36 HEAVY ABSORB (UNDERPADS AND DIAPERS) ×2 IMPLANT
WATER STERILE IRR 1000ML POUR (IV SOLUTION) ×2 IMPLANT

## 2021-07-08 NOTE — Anesthesia Procedure Notes (Signed)
Procedure Name: Intubation Date/Time: 07/08/2021 10:39 AM Performed by: Carolan Clines, CRNA Pre-anesthesia Checklist: Patient identified, Emergency Drugs available, Suction available and Patient being monitored Patient Re-evaluated:Patient Re-evaluated prior to induction Oxygen Delivery Method: Circle System Utilized Preoxygenation: Pre-oxygenation with 100% oxygen Induction Type: IV induction Ventilation: Oral airway inserted - appropriate to patient size and Two handed mask ventilation required Laryngoscope Size: Mac and 4 Grade View: Grade I Tube type: Oral Tube size: 7.5 mm Number of attempts: 1 Airway Equipment and Method: Stylet and Oral airway Placement Confirmation: ETT inserted through vocal cords under direct vision, positive ETCO2 and breath sounds checked- equal and bilateral Secured at: 22 cm Tube secured with: Tape Dental Injury: Teeth and Oropharynx as per pre-operative assessment

## 2021-07-08 NOTE — Brief Op Note (Signed)
07/08/2021  11:34 AM  PATIENT:  Jesus Hendrix  75 y.o. male  PRE-OPERATIVE DIAGNOSIS:  Hydrocephalus  POST-OPERATIVE DIAGNOSIS:  Hydrocephalus  PROCEDURE:  Procedure(s): Shunt Placment - right (Right)  SURGEON:  Surgeon(s) and Role:    * Earnie Larsson, MD - Primary    * Ashok Pall, MD - Assisting  PHYSICIAN ASSISTANT:   ASSISTANTS:    ANESTHESIA:   general  EBL:  Minimal   BLOOD ADMINISTERED:none  DRAINS: none   LOCAL MEDICATIONS USED:  None   SPECIMEN:  No Specimen  DISPOSITION OF SPECIMEN:  N/A  COUNTS:  YES  TOURNIQUET:  * No tourniquets in log *  DICTATION: .Dragon Dictation  PLAN OF CARE: Admit to inpatient   PATIENT DISPOSITION:  PACU - hemodynamically stable.   Delay start of Pharmacological VTE agent (>24hrs) due to surgical blood loss or risk of bleeding: yes

## 2021-07-08 NOTE — H&P (Signed)
Jesus Hendrix is an 75 y.o. male.   Chief Complaint: Confusion HPI: 75 year old male with progressive mental decline with associated gait instability and incontinence.  Patient has been found to have significant ventriculomegaly with minimal evidence of cortical atrophy.  He is status post therapeutic lumbar drainage with some temporary improvement of his symptoms.  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 Past Medical History:  Diagnosis Date   Anxiety    Aortic stenosis    a. mild on echo 04/2014   AV block, Mobitz 2    a. 03/2018 s/p SJM Jennette Banker MP RF model CD [id number] BiV PPM (ser #: [id number]).   Choledocholithiasis    Chronic systolic CHF (congestive heart failure) ([place])    a. echo 04/2014: EF 45-50%, nl LV dia fxn, mild AS, LA mildly dilated, PASP nl; b. 07/2019 Echo: EF 35-40%, diff HK, Nl RV fxn, RVSP 53.49mmHg. Sev dil LA. Mod dil RA. Mild to mod MR. Mod TR. Mod AS. Ao root 37cm.   CKD (chronic kidney disease), stage IV (HCC)    COPD (chronic obstructive pulmonary disease) (HCC)    Not on home oxygen   Coronary artery disease    a. status post CABG in 2007; b. nuc stress test 2014: small region of mild ischemia in the inferior territory, EF 61%, no ischemia, scan similar to prior scan in 2009; c. 07/2016 MV: No ischemia.   Exogenous obesity    GERD (gastroesophageal reflux disease)    Gout    Hiatal hernia    Hip pain, chronic 08/03/2016   Waiting to have hip replacement pending NM stress test results per patient.   History of colonic diverticulitis    History of kidney stones    History of renal cell carcinoma    a. s/p laser ablation    Hyperlipidemia    Hypertension    Memory loss due to medical condition    possibly due to fluid on his brain   Moderate aortic stenosis    a. 07/2019 Echo: Mod AS.   Near syncope    a. 3 separate events in 2017   Nephrolithiasis    Panic attacks    Peripheral vascular disease ([place])     Permanent atrial fibrillation ([place]) 06/06/2016   a. Dx 06/06/2016; b. CHADS2VASc => 5 (CHF, HTN, age x 1, DM, vascular disease)-->was on Eliquis but d/c'd 2/2 anemia and hemoptysis.   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[place])     Past Surgical History:  Procedure Laterality Date   CARDIAC CATHETERIZATION     CHOLECYSTECTOMY     COLONOSCOPY N/A 06/03/2015   Procedure: COLONOSCOPY;  Surgeon: Lucilla Lame, MD;  Location: [place];  Service: Gastroenterology;  Laterality: N/A;   CORONARY ARTERY BYPASS GRAFT  2004   CABG x 3   EYE SURGERY     HERNIA REPAIR     x 2   HIP SURGERY     right hip replacement   INTRAOCULAR LENS INSERTION     KIDNEY SURGERY     cancer   NOSE SURGERY     PACEMAKER IMPLANT N/A 04/05/2018   SJM Biventricular pacemaker implanted by Dr Rayann Heman for second degree AV block, EF < 50% and permanent afib   POLYPECTOMY  06/03/2015  Procedure: POLYPECTOMY;  Surgeon: Lucilla Lame, MD;  Location: [place];  Service: Gastroenterology;;   PROSTATE SURGERY     TOTAL HIP ARTHROPLASTY Left 12/19/2016   Procedure: LEFT TOTAL HIP ARTHROPLASTY;  Surgeon: Garald Balding, MD;  Location: [place];  Service: Orthopedics;  Laterality: Left;    Family History  Problem Relation Age of Onset   Heart disease Mother    Heart disease Sister    Heart disease Brother    Social History:  reports that he quit smoking about 46 years ago. His smoking use included cigarettes. He has a 12.50 pack-year smoking history. He has never used smokeless tobacco. He reports previous alcohol use of about 2.0 standard drinks of alcohol per week. He reports that he does not use drugs.  Allergies:  Allergies  Allergen Reactions   Clonazepam Other (See Comments)    Patient is confused for up to 12+ hours after  administration. (Patient is still taking)      Medications Prior to Admission  Medication Sig Dispense Refill   apixaban (ELIQUIS) 5 MG TABS tablet Take 1 tablet (5 mg total) by mouth 2 (two) times daily. 180 tablet 1   clonazePAM (KLONOPIN) 1 MG tablet Take 1 mg by mouth at bedtime.     donepezil (ARICEPT) 5 MG tablet Take 5 mg by mouth at bedtime.     fluticasone (FLONASE) 50 MCG/ACT nasal spray Place 1-2 sprays into both nostrils daily as needed for allergies.     losartan (COZAAR) 25 MG tablet Take 1 tablet (25 mg total) by mouth daily. (Patient taking differently: Take 25 mg by mouth at bedtime.) 90 tablet 3   NON FORMULARY Take 1 capsule by mouth in the morning and at bedtime. Pure Encapsulations Uric Acid Formula     torsemide (DEMADEX) 20 MG tablet TAKE 1 TABLET(20 MG) BY MOUTH TWICE DAILY (Patient taking differently: Take 20 mg by mouth 2 (two) times daily.) 180 tablet 3   venlafaxine XR (EFFEXOR-XR) 37.5 MG 24 hr capsule Take 37.5 mg by mouth in the morning.     acetaminophen (TYLENOL) 325 MG tablet Take 325-650 mg by mouth every 6 (six) hours as needed for pain.     famotidine (PEPCID) 20 MG tablet TAKE 1 TABLET(20 MG) BY MOUTH EVERY DAY AT BEDTIME AS NEEDED FOR HEART BURN (Patient taking differently: Take 20 mg by mouth daily as needed for indigestion or heartburn.) 90 tablet 0   meloxicam (MOBIC) 15 MG tablet Take 15 mg by mouth daily as needed for pain.      No results found for this or any previous visit (from the past 48 hour(s)). No results found.  Pertinent items noted in HPI and remainder of comprehensive ROS otherwise negative.  Blood pressure (!) 149/56, pulse 61, temperature 98.2 F (36.8 C), temperature source Oral, resp. rate 20, height 5\' 8"  (1.727 m), weight 106.6 kg, SpO2 96 %.  Patient is awake and alert.  He is oriented and reasonably appropriate.  Speech is fluent.  His judgment and insight are poor.  Cranial nerve function normal bilateral.  Motor  examination 5/5 bilaterally.  No pronator drift.  Coordination fair.  Sensory examination nonfocal.  Examination head ears eyes nose and throat is unremarked.  Chest and abdomen are benign.  Extremities are free of major deformity. Assessment/Plan Community hydrocephalus.  Plan right occipital VP shunt.  Risks and benefits of been explained.  Patient wishes to proceed.  Jesus Hendrix 07/08/2021, 9:53 AM

## 2021-07-08 NOTE — Transfer of Care (Signed)
Immediate Anesthesia Transfer of Care Note  Patient: Jesus Hendrix  Procedure(s) Performed: Shunt Placment - right (Right)  Patient Location: PACU  Anesthesia Type:General  Level of Consciousness: awake and alert   Airway & Oxygen Therapy: Patient Spontanous Breathing and Patient connected to face mask oxygen  Post-op Assessment: Report given to RN and Post -op Vital signs reviewed and stable  Post vital signs: Reviewed and stable  Last Vitals:  Vitals Value Taken Time  BP 148/64 07/08/21 1151  Temp    Pulse 66 07/08/21 1154  Resp 14 07/08/21 1154  SpO2 100 % 07/08/21 1154  Vitals shown include unvalidated device data.  Last Pain:  Vitals:   07/08/21 0941  TempSrc:   PainSc: 0-No pain      Patients Stated Pain Goal: 3 (40/37/54 3606)  Complications: No notable events documented.

## 2021-07-08 NOTE — Anesthesia Postprocedure Evaluation (Signed)
Anesthesia Post Note  Patient: Jesus Hendrix  Procedure(s) Performed: Shunt Placment - right (Right)     Patient location during evaluation: PACU Anesthesia Type: General Level of consciousness: awake and alert and oriented Pain management: pain level controlled Vital Signs Assessment: post-procedure vital signs reviewed and stable Respiratory status: spontaneous breathing, nonlabored ventilation and respiratory function stable Cardiovascular status: blood pressure returned to baseline Postop Assessment: no apparent nausea or vomiting Anesthetic complications: no   No notable events documented.  Last Vitals:  Vitals:   07/08/21 1220 07/08/21 1235  BP: 119/68   Pulse: 64 66  Resp: 13 14  Temp:    SpO2: 96% 98%                  Brennan Bailey

## 2021-07-08 NOTE — Op Note (Signed)
Date of procedure: 07/08/2021  Date of dictation: Same  Service: Neurosurgery  Preoperative diagnosis: Communicating hydrocephalus  Postoperative diagnosis: Same  Procedure Name: Right occipital ventriculoperitoneal shunt  Surgeon:Chrys Landgrebe A.Heber Hoog, M.D.  Asst. Surgeon: Christella Noa  Anesthesia: General  Indication: 75 year old male with progressive mental decline and increasing ataxia and instability with associated incontinence.  Work-up demonstrates evidence of marked ventriculomegaly with peri ependymal edema.  Patient is improved with therapeutic lumbar drainage.  Patient presents now for VP shunting.  operative note: After induction of anesthesia, patient position supine with neck turned toward the left and right shoulder elevated.  Patient's scalp neck chest and abdomen were prepped and draped sterilely.  Incision made in his epigastric region off to the right side.  This carried down sharply to the anterior rectus sheath.  Anterior rectus sheath was divided.  Rectus muscles were separated and the posterior rectus sheath was elevated.  Posterior rectus sheath was incised.  Preperitoneal space and peritoneum identified.  The peritoneum was bluntly entered.  The peritoneal cavity was visualized.  Attention then placed to the head.  A right occipital incision was made.  This carried down sharply to the pericranium.  A single bur hole was made in this region.  A inferior pocket was made with a clamp for placement of the shunt valve.  Shunt passer was then placed and used to traverse from the cranial wound to the abdominal wound.  A Medtronic strata 2 valve set at performance level 1.0 was then passed through the shunt passer.  An 9 cm ventricular catheter was then placed into the ventricle with good return of CSF under pressure.  The snap connectors were attached to the shunt system.  Good flow of CSF was observed through the distal catheter.  The distal catheter was then placed into the peritoneal  space.  Wounds were then closed in a typical fashion.  Sterile dressings were applied.  No apparent complications.

## 2021-07-09 MED ORDER — HYDROCODONE-ACETAMINOPHEN 5-325 MG PO TABS: 1.0000 | ORAL_TABLET | ORAL | 0 refills | Status: DC | PRN

## 2021-07-09 NOTE — Progress Notes (Signed)
Patient alert and oriented, voiding adequately, skin clean, dry and intact without evidence of skin break down, or symptoms of complications - no redness or edema noted, only slight tenderness at site.  Patient states pain is manageable at time of discharge. Patient has an appointment with MD in 2 weeks 

## 2021-07-09 NOTE — TOC Transition Note (Signed)
Transition of Care [facility]) - CM/SW Discharge Note   Patient Details  Name: Jesus Hendrix MRN: [id number] Date of Birth: [birth year]  Transition of Care [facility]) CM/SW Contact:  Carles Collet, RN Phone Number: 07/09/2021, 9:49 AM   Clinical Narrative:    Damaris Schooner w patient at bedside.  Referral made to [facility], patient states he had no preference. Unit staff will supply needed DME. No other CM needs identified    Final next level of care: Home w Home Health Services Barriers to Discharge: No Barriers Identified   Patient Goals and CMS Choice Patient states their goals for this hospitalization and ongoing recovery are:: to return home CMS Medicare.gov Compare Post Acute Care list provided to:: Other (Comment Required) Choice offered to / list presented to : Spouse  Discharge Placement                       Discharge Plan and Services                          HH Arranged: PT, OT [facility] Agency: [facility] Date [place]: 07/09/21 Time [place]: [phone] Representative spoke with at [employer]: [place]) Interventions     Readmission Risk Interventions No flowsheet data found.

## 2021-07-09 NOTE — Discharge Instructions (Signed)
Wound Care Keep incision covered and dry for two days.   Do not put any creams, lotions, or ointments on incision. Leave steri-strips in place Activity Walk each and every day, increasing distance each day. No lifting greater than 5 lbs.  Avoid excessive neck motion.  Diet Resume your normal diet.   Call Your Doctor If Any of These Occur Redness, drainage, or swelling at the wound.  Persistent headaches Temperature greater than 101 degrees. Severe pain not relieved by pain medication. Incision starts to come apart. Follow Up Appt Call  [phone]) for problems.

## 2021-07-09 NOTE — Evaluation (Signed)
Physical Therapy Evaluation Patient Details Name: GRESHAM CAETANO MRN: [id number] DOB: 06-02-1946 Today's Date: 07/09/2021   History of Present Illness  75 year old male with progressive mental decline and increasing ataxia and instability with associated incontinence due to communicating hydrocephalus. Admitted 7/22 for R occipital ventriculoperitoneal shunt. PMHx: Lt THA, AS, Afib, CKD, CHF, COPD, gout, HTN, spinal stenosis, panic attacks  Clinical Impression  PT pleasant but HOH and reports initial dizziness with sitting EOB with dizziness, LLE dragging and right lean at baseline. PT reports several falls at home with assist for all homemaking but able to bathe in sitting. Pt with decreased memory, awareness, balance, strength and function who will benefit from acute therapy to maximize mobility, safety and function.      Follow Up Recommendations Home health PT    Equipment Recommendations  None recommended by PT    Recommendations for Other Services       Precautions / Restrictions Precautions Precautions: Fall      Mobility  Bed Mobility Overal bed mobility: Modified Independent                  Transfers Overall transfer level: Needs assistance   Transfers: Sit to/from Stand Sit to Stand: Min guard         General transfer comment: cues for hand placement, safety and sequence to back fully to surface  Ambulation/Gait Ambulation/Gait assistance: Min assist Gait Distance (Feet): 400 Feet Assistive device: Rolling walker (2 wheeled) Gait Pattern/deviations: Step-to pattern;Decreased step length - right;Decreased stride length;Decreased dorsiflexion - left   Gait velocity interpretation: <1.8 ft/sec, indicate of risk for recurrent falls General Gait Details: pt with tendency for dragging LLE with gait requiring mod cues to step into RW and pick up and step with left foot. PT with tendency for veering left with assist for balance and RW control  Stairs             Wheelchair Mobility    Modified Rankin (Stroke Patients Only)       Balance Overall balance assessment: Needs assistance   Sitting balance-Leahy Scale: Fair     Standing balance support: Bilateral upper extremity supported Standing balance-Leahy Scale: Poor Standing balance comment: RW in standing                             Pertinent Vitals/Pain Pain Assessment: No/denies pain    Home Living Family/patient expects to be discharged to:: Private residence Living Arrangements: Spouse/significant other Available Help at Discharge: Family;Available 24 hours/day Type of Home: House Home Access: Stairs to enter Entrance Stairs-Rails: Left Entrance Stairs-Number of Steps: 2 Home Layout: One level Home Equipment: Walker - 2 wheels;Bedside commode;Cane - single point;Wheelchair - manual;Shower seat      Prior Function Level of Independence: Needs assistance   Gait / Transfers Assistance Needed: walks with RW  ADL's / Homemaking Assistance Needed: supervision with use of shower seat for bathing, wife does the homemaking and driving. Assist at times for catherization        Hand Dominance        Extremity/Trunk Assessment   Upper Extremity Assessment Upper Extremity Assessment: Generalized weakness    Lower Extremity Assessment Lower Extremity Assessment: LLE deficits/detail LLE Deficits / Details: decreased hip flexion and dorsiflexion with activity    Cervical / Trunk Assessment Cervical / Trunk Assessment: Kyphotic  Communication      Cognition Arousal/Alertness: Awake/alert Behavior During Therapy: WFL for tasks assessed/performed Overall Cognitive  Status: Impaired/Different from baseline Area of Impairment: Memory;Safety/judgement;Awareness                     Memory: Decreased short-term memory   Safety/Judgement: Decreased awareness of safety;Decreased awareness of deficits Awareness: Intellectual   General Comments:  decreased STM, needs repetitive cueing for safety and position in RW      General Comments      Exercises     Assessment/Plan    PT Assessment Patient needs continued PT services  PT Problem List Decreased strength;Decreased mobility;Decreased activity tolerance;Decreased balance;Decreased knowledge of use of DME;Decreased cognition       PT Treatment Interventions Gait training;Functional mobility training;Therapeutic activities;Patient/family education;Stair training;Balance training;DME instruction;Therapeutic exercise    PT Goals (Current goals can be found in the Care Plan section)  Acute Rehab PT Goals Patient Stated Goal: return home PT Goal Formulation: With patient/family Time For Goal Achievement: 07/22/21 Potential to Achieve Goals: Fair    Frequency Min 3X/week   Barriers to discharge        Co-evaluation               AM-PAC PT "6 Clicks" Mobility  Outcome Measure Help needed turning from your back to your side while in a flat bed without using bedrails?: None Help needed moving from lying on your back to sitting on the side of a flat bed without using bedrails?: None Help needed moving to and from a bed to a chair (including a wheelchair)?: A Little Help needed standing up from a chair using your arms (e.g., wheelchair or bedside chair)?: A Little Help needed to walk in hospital room?: A Little Help needed climbing 3-5 steps with a railing? : A Lot 6 Click Score: 19    End of Session   Activity Tolerance: Patient tolerated treatment well Patient left: in chair;with call bell/phone within reach;with family/visitor present Nurse Communication: Mobility status PT Visit Diagnosis: Other abnormalities of gait and mobility (R26.89);Difficulty in walking, not elsewhere classified (R26.2);Muscle weakness (generalized) (M62.81)    Time: 4536-4680 PT Time Calculation (min) (ACUTE ONLY): 25 min   Charges:   PT Evaluation $PT Eval Moderate Complexity: 1  Mod PT Treatments $Gait Training: 8-22 mins        Bayard Males, PT Acute Rehabilitation Services Pager: [phone] Office: [place] 07/09/2021, 8:16 AM

## 2021-07-09 NOTE — Discharge Summary (Signed)
Physician Discharge Summary  [name] MRN: [id number] DOB/AGE: 77-Jun-1947 77 y.o.  Admit date: 07/08/2021 Discharge date: 07/09/2021  Admission Diagnoses: Normal pressure hydrocephalus  Discharge Diagnoses: Normal pressure hydrocephalus Active Problems:   Communicating hydrocephalus [facility])   Discharged Condition: fair  Hospital Course: Patient was admitted to undergo ventriculoperitoneal shunting procedure in the right parieto-occipital region for normal pressure hydrocephalus he tolerated surgery well.  Consults: None  Significant Diagnostic Studies: None  Treatments: surgery: Right parieto-occipital ventriculoperitoneal shunt  Discharge Exam: Blood pressure (!) 148/58, pulse 60, temperature (!) 97.5 F (36.4 C), temperature source Oral, resp. rate 18, height 5\' 8"  (1.727 m), weight 106.6 kg, SpO2 94 %. Incision is clean and dry motor function is intact.  Disposition: Discharge disposition: 01-Home or Self Care       Discharge Instructions     Call MD for:  redness, tenderness, or signs of infection (pain, swelling, redness, odor or green/yellow discharge around incision site)   Complete by: As directed    Call MD for:  severe uncontrolled pain   Complete by: As directed    Call MD for:  temperature >100.4   Complete by: As directed    Diet - low sodium heart healthy   Complete by: As directed    Discharge wound care:   Complete by: As directed    Per nursing staff   Increase activity slowly   Complete by: As directed       Allergies as of 07/09/2021       Reactions   Clonazepam Other (See Comments)   Patient is confused for up to 12+ hours after administration. (Patient is still taking)        Medication List     TAKE these medications    acetaminophen 325 MG tablet Commonly known as: TYLENOL Take 325-650 mg by mouth every 6 (six) hours as needed for pain.   apixaban 5 MG Tabs tablet Commonly known as: ELIQUIS Take 1 tablet (5  mg total) by mouth 2 (two) times daily.   clonazePAM 1 MG tablet Commonly known as: KLONOPIN Take 1 mg by mouth at bedtime.   donepezil 5 MG tablet Commonly known as: ARICEPT Take 5 mg by mouth at bedtime.   famotidine 20 MG tablet Commonly known as: PEPCID TAKE 1 TABLET(20 MG) BY MOUTH EVERY DAY AT BEDTIME AS NEEDED FOR HEART BURN What changed: See the new instructions.   fluticasone 50 MCG/ACT nasal spray Commonly known as: FLONASE Place 1-2 sprays into both nostrils daily as needed for allergies.   HYDROcodone-acetaminophen 5-325 MG tablet Commonly known as: NORCO/VICODIN Take 1 tablet by mouth every 4 (four) hours as needed for moderate pain ((score 4 to 6)).   losartan 25 MG tablet Commonly known as: COZAAR Take 1 tablet (25 mg total) by mouth daily. What changed: when to take this   meloxicam 15 MG tablet Commonly known as: MOBIC Take 15 mg by mouth daily as needed for pain.   NON FORMULARY Take 1 capsule by mouth in the morning and at bedtime. Pure Encapsulations Uric Acid Formula   torsemide 20 MG tablet Commonly known as: DEMADEX TAKE 1 TABLET(20 MG) BY MOUTH TWICE DAILY What changed:  how much to take how to take this when to take this additional instructions   venlafaxine XR 37.5 MG 24 hr capsule Commonly known as: EFFEXOR-XR Take 37.5 mg by mouth in the morning.  Discharge Care Instructions  (From admission, onward)           Start     Ordered   07/09/21 0000  Discharge wound care:       Comments: Per nursing staff   07/09/21 [phone]            Follow-up Information     Earnie Larsson, MD. Call.   Specialty: Neurosurgery Why: If symptoms worsen Contact information: [address] [place] [address] [phone]                 Signed: Earleen Newport 07/09/2021, 8:22 AM

## 2021-07-09 NOTE — Evaluation (Signed)
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 Admitted 7/22 for R occipital ventriculoperitoneal shunt. PMHx: Lt THA, AS, Afib, CKD, CHF, COPD, gout, HTN, spinal stenosis, panic attacks   Clinical Impression   Patient evaluated by Occupational Therapy with no further acute OT needs identified. All education has been completed and the patient has no further questions. See below for any follow-up Occupational Therapy or equipment needs. OT to sign off. Thank you for referral.      Follow Up Recommendations  No OT follow up    Equipment Recommendations  None recommended by OT (has all DME)    Recommendations for Other Services       Precautions / Restrictions Precautions Precautions: Fall      Mobility Bed Mobility Overal bed mobility: Modified Independent             General bed mobility comments: in chair on arrival    Transfers Overall transfer level: Needs assistance Equipment used: Rolling walker (2 wheeled) Transfers: Sit to/from Stand Sit to Stand: Min guard         General transfer comment: cues for hand placement, safety and sequence to back fully to surface    Balance Overall balance assessment: Needs assistance   Sitting balance-Leahy Scale: Fair     Standing balance support: Bilateral upper extremity supported Standing balance-Leahy Scale: Poor Standing balance comment: RW in standing                           ADL either performed or assessed with clinical judgement   ADL Overall ADL's : Needs assistance/impaired Eating/Feeding: Independent               Upper Body Dressing : Modified independent   Lower Body Dressing: Modified independent Lower Body Dressing Details  (indicate cue type and reason): cues to dress in sitting vs standing for safety. pt states okay ill sit Toilet Transfer: Min guard;RW           Functional mobility during ADLs: Min guard;Rolling walker General ADL Comments: pt with heavy use of hands for sit <>stand  Educated in clean linen every shower   Vision         Perception     Praxis      Pertinent Vitals/Pain Pain Assessment: Faces Faces Pain Scale: Hurts even more Pain Location: belly at incision site Pain Descriptors / Indicators: Discomfort;Grimacing Pain Intervention(s): Monitored during session;Premedicated before session;Repositioned     Hand Dominance Right   Extremity/Trunk Assessment Upper Extremity Assessment Upper Extremity Assessment: Overall WFL for tasks assessed   Lower Extremity Assessment Lower Extremity Assessment: Defer to PT evaluation;LLE deficits/detail LLE Deficits / Details: drags with transfer at times   Cervical / Trunk Assessment Cervical / Trunk Assessment: Kyphotic   Communication Communication Communication: HOH (reads lips to hear)   Cognition Arousal/Alertness: Awake/alert Behavior During Therapy: WFL for tasks assessed/performed Overall Cognitive Status: Impaired/Different from baseline Area of Impairment: Memory;Safety/judgement;Awareness                     Memory: Decreased short-term memory   Safety/Judgement: Decreased awareness of safety;Decreased awareness of deficits Awareness: Intellectual   General Comments: decreased STM, needs repetitive cueing for safety and position in RW   General Comments  pt educated on washing around incision not on the cut. pt verbalized back with teach back x3 during session. pt educated on placing hat on head with loose strap. pt demonstrates bracing against surfaces with standing with back of legs for stability    Exercises     Shoulder Instructions      Home Living Family/patient expects to be discharged to::  Private residence Living Arrangements: Spouse/significant other Available Help at Discharge: Family;Available 24 hours/day Type of Home: House Home Access: Stairs to enter [employer] of Steps: 2 Entrance Stairs-Rails: Left Home Layout: One level     Bathroom Shower/Tub: Occupational psychologist: Handicapped height     Home Equipment: Environmental consultant - 2 wheels;Bedside commode;Cane - single point;Wheelchair - Brewing technologist;Other (comment) (lift chair)           Prior Functioning/Environment Level of Independence: Needs assistance  Gait / Transfers Assistance Needed: walks with RW ADL's / Homemaking Assistance Needed: supervision with use of shower seat for bathing, wife does the homemaking and driving. Assist at times for catherization            OT Problem List: Impaired balance (sitting and/or standing)      OT Treatment/Interventions:      OT Goals(Current goals can be found in the care plan section) Acute Rehab OT Goals Patient Stated Goal: return home OT Goal Formulation: With patient/family  OT Frequency:     Barriers to D/C:            Co-evaluation              AM-PAC OT "6 Clicks" Daily Activity     Outcome Measure Help from another person eating meals?: None Help from another person taking care of personal grooming?: A Little Help from another person toileting, which includes using toliet, bedpan, or urinal?: A Little Help from another person bathing (including washing, rinsing, drying)?: A Little Help from another person to put on and taking off regular upper body clothing?: None Help from another person to put on and taking off regular lower body clothing?: A Little 6 Click Score: 20   End of Session Equipment Utilized During Treatment: Rolling walker Nurse Communication: Mobility status;Precautions  Activity Tolerance: Patient tolerated treatment well Patient left: in chair;with call bell/phone within reach;with  family/visitor present  OT Visit Diagnosis: Unsteadiness on feet (R26.81)                Time: 7078-6754 OT Time Calculation (min): 26 min Charges:  OT General Charges $OT Visit: 1 Visit OT Evaluation $OT Eval Moderate Complexity: 1 Mod OT Treatments $Self Care/Home Management : 8-22 mins   Brynn, OTR/L  Acute Rehabilitation Services Pager: [phone] Office: [phone] .   Jeri Modena 07/09/2021, 9:38 AM

## 2021-07-11 ENCOUNTER — Encounter (HOSPITAL_COMMUNITY): Payer: Self-pay | Admitting: Neurosurgery

## 2021-07-11 DIAGNOSIS — I5022 Chronic systolic (congestive) heart failure: Secondary | ICD-10-CM | POA: Diagnosis not present

## 2021-07-11 DIAGNOSIS — J449 Chronic obstructive pulmonary disease, unspecified: Secondary | ICD-10-CM | POA: Diagnosis not present

## 2021-07-11 DIAGNOSIS — I130 Hypertensive heart and chronic kidney disease with heart failure and stage 1 through stage 4 chronic kidney disease, or unspecified chronic kidney disease: Secondary | ICD-10-CM | POA: Diagnosis not present

## 2021-07-11 DIAGNOSIS — M3131 Wegener's granulomatosis with renal involvement: Secondary | ICD-10-CM | POA: Diagnosis not present

## 2021-07-11 DIAGNOSIS — M109 Gout, unspecified: Secondary | ICD-10-CM | POA: Diagnosis not present

## 2021-07-11 DIAGNOSIS — M4800 Spinal stenosis, site unspecified: Secondary | ICD-10-CM | POA: Diagnosis not present

## 2021-07-11 DIAGNOSIS — F0390 Unspecified dementia without behavioral disturbance: Secondary | ICD-10-CM | POA: Diagnosis not present

## 2021-07-11 DIAGNOSIS — Z48811 Encounter for surgical aftercare following surgery on the nervous system: Secondary | ICD-10-CM | POA: Diagnosis not present

## 2021-07-11 DIAGNOSIS — I350 Nonrheumatic aortic (valve) stenosis: Secondary | ICD-10-CM | POA: Diagnosis not present

## 2021-07-11 DIAGNOSIS — I2510 Atherosclerotic heart disease of native coronary artery without angina pectoris: Secondary | ICD-10-CM | POA: Diagnosis not present

## 2021-07-11 DIAGNOSIS — G910 Communicating hydrocephalus: Secondary | ICD-10-CM | POA: Diagnosis not present

## 2021-07-11 DIAGNOSIS — N184 Chronic kidney disease, stage 4 (severe): Secondary | ICD-10-CM | POA: Diagnosis not present

## 2021-07-11 DIAGNOSIS — M26629 Arthralgia of temporomandibular joint, unspecified side: Secondary | ICD-10-CM | POA: Diagnosis not present

## 2021-07-11 DIAGNOSIS — I4821 Permanent atrial fibrillation: Secondary | ICD-10-CM | POA: Diagnosis not present

## 2021-07-11 DIAGNOSIS — I739 Peripheral vascular disease, unspecified: Secondary | ICD-10-CM | POA: Diagnosis not present

## 2021-07-11 DIAGNOSIS — I441 Atrioventricular block, second degree: Secondary | ICD-10-CM | POA: Diagnosis not present

## 2021-07-14 DIAGNOSIS — I5022 Chronic systolic (congestive) heart failure: Secondary | ICD-10-CM | POA: Diagnosis not present

## 2021-07-14 DIAGNOSIS — Z48811 Encounter for surgical aftercare following surgery on the nervous system: Secondary | ICD-10-CM | POA: Diagnosis not present

## 2021-07-14 DIAGNOSIS — I130 Hypertensive heart and chronic kidney disease with heart failure and stage 1 through stage 4 chronic kidney disease, or unspecified chronic kidney disease: Secondary | ICD-10-CM | POA: Diagnosis not present

## 2021-07-14 DIAGNOSIS — G910 Communicating hydrocephalus: Secondary | ICD-10-CM | POA: Diagnosis not present

## 2021-07-18 ENCOUNTER — Other Ambulatory Visit: Payer: Self-pay | Admitting: Internal Medicine

## 2021-07-18 ENCOUNTER — Ambulatory Visit: Payer: Medicare Other | Admitting: Physical Therapy

## 2021-07-20 ENCOUNTER — Ambulatory Visit: Payer: Medicare Other | Admitting: Physical Therapy

## 2021-07-25 ENCOUNTER — Ambulatory Visit: Payer: Medicare Other | Admitting: Physical Therapy

## 2021-07-25 ENCOUNTER — Ambulatory Visit (INDEPENDENT_AMBULATORY_CARE_PROVIDER_SITE_OTHER): Payer: Medicare Other

## 2021-07-25 DIAGNOSIS — Z950 Presence of cardiac pacemaker: Secondary | ICD-10-CM

## 2021-07-25 DIAGNOSIS — I5042 Chronic combined systolic (congestive) and diastolic (congestive) heart failure: Secondary | ICD-10-CM | POA: Diagnosis not present

## 2021-07-25 NOTE — Progress Notes (Signed)
Remote pacemaker transmission.   

## 2021-07-27 ENCOUNTER — Ambulatory Visit: Payer: Medicare Other | Admitting: Physical Therapy

## 2021-07-27 NOTE — Progress Notes (Signed)
EPIC Encounter for ICM Monitoring  Patient Name: Jesus Hendrix is a 75 y.o. male Date: 07/27/2021 Primary Care Physican: Maryland Pink, MD Primary Cardiologist: Rockey Situ Electrophysiologist: Allred 04/15/2021 Office Weight: 235 lbs         Transmission reviewed.    CorVue Thoracic impedance normal but was suggesting possible fluid accumlation from 7/11-7/26. Which correlates with hospitalization for procedure.   Prescribed: Torsemide 20 mg take 1 tablet twice a day.   Labs: 01/11/2021 Creatinine 2.11, BUN 39, Potassium 4.4, Sodium 140, GFR 30-34 A complete set of results can be found in Results Review.   Recommendations:  No changes.   Follow-up plan: ICM clinic phone appointment on 08/29/2021. 91 day device clinic remote transmission 09/30/2021.       EP/Cardiology Office Visits:  08/16/2021 with Oda Kilts, PA.  Recall 07/10/2021 with Dr Rockey Situ.   Copy of ICM check sent to Dr. Rayann Heman.   3 month ICM trend: 07/25/2021.    1 Year ICM trend:       [name], RN 07/27/2021 12:16 PM

## 2021-07-28 NOTE — Unmapped (Signed)
This encounter was created in error - please disregard.

## 2021-07-29 ENCOUNTER — Ambulatory Visit: Payer: Medicare Other

## 2021-08-02 ENCOUNTER — Ambulatory Visit: Payer: Medicare Other | Admitting: Physical Therapy

## 2021-08-04 ENCOUNTER — Ambulatory Visit: Payer: Medicare Other

## 2021-08-08 ENCOUNTER — Ambulatory Visit: Payer: Medicare Other | Admitting: Physical Therapy

## 2021-08-08 DIAGNOSIS — R339 Retention of urine, unspecified: Secondary | ICD-10-CM | POA: Diagnosis not present

## 2021-08-11 ENCOUNTER — Ambulatory Visit: Payer: Medicare Other | Admitting: Physical Therapy

## 2021-08-11 DIAGNOSIS — N184 Chronic kidney disease, stage 4 (severe): Secondary | ICD-10-CM | POA: Diagnosis not present

## 2021-08-11 DIAGNOSIS — I4821 Permanent atrial fibrillation: Secondary | ICD-10-CM | POA: Diagnosis not present

## 2021-08-11 DIAGNOSIS — I739 Peripheral vascular disease, unspecified: Secondary | ICD-10-CM | POA: Diagnosis not present

## 2021-08-11 DIAGNOSIS — Z48811 Encounter for surgical aftercare following surgery on the nervous system: Secondary | ICD-10-CM | POA: Diagnosis not present

## 2021-08-11 DIAGNOSIS — M3131 Wegener's granulomatosis with renal involvement: Secondary | ICD-10-CM | POA: Diagnosis not present

## 2021-08-11 DIAGNOSIS — I130 Hypertensive heart and chronic kidney disease with heart failure and stage 1 through stage 4 chronic kidney disease, or unspecified chronic kidney disease: Secondary | ICD-10-CM | POA: Diagnosis not present

## 2021-08-11 DIAGNOSIS — G910 Communicating hydrocephalus: Secondary | ICD-10-CM | POA: Diagnosis not present

## 2021-08-11 DIAGNOSIS — M109 Gout, unspecified: Secondary | ICD-10-CM | POA: Diagnosis not present

## 2021-08-11 DIAGNOSIS — M4800 Spinal stenosis, site unspecified: Secondary | ICD-10-CM | POA: Diagnosis not present

## 2021-08-11 DIAGNOSIS — M26629 Arthralgia of temporomandibular joint, unspecified side: Secondary | ICD-10-CM | POA: Diagnosis not present

## 2021-08-11 DIAGNOSIS — I350 Nonrheumatic aortic (valve) stenosis: Secondary | ICD-10-CM | POA: Diagnosis not present

## 2021-08-11 DIAGNOSIS — F0390 Unspecified dementia without behavioral disturbance: Secondary | ICD-10-CM | POA: Diagnosis not present

## 2021-08-11 DIAGNOSIS — I2510 Atherosclerotic heart disease of native coronary artery without angina pectoris: Secondary | ICD-10-CM | POA: Diagnosis not present

## 2021-08-11 DIAGNOSIS — I441 Atrioventricular block, second degree: Secondary | ICD-10-CM | POA: Diagnosis not present

## 2021-08-11 DIAGNOSIS — J449 Chronic obstructive pulmonary disease, unspecified: Secondary | ICD-10-CM | POA: Diagnosis not present

## 2021-08-11 DIAGNOSIS — I5022 Chronic systolic (congestive) heart failure: Secondary | ICD-10-CM | POA: Diagnosis not present

## 2021-08-16 ENCOUNTER — Encounter: Payer: Medicare Other | Admitting: Student

## 2021-08-29 ENCOUNTER — Ambulatory Visit (INDEPENDENT_AMBULATORY_CARE_PROVIDER_SITE_OTHER): Payer: Medicare Other

## 2021-08-29 DIAGNOSIS — I5042 Chronic combined systolic (congestive) and diastolic (congestive) heart failure: Secondary | ICD-10-CM | POA: Diagnosis not present

## 2021-08-29 DIAGNOSIS — Z950 Presence of cardiac pacemaker: Secondary | ICD-10-CM

## 2021-08-30 NOTE — Progress Notes (Signed)
EPIC Encounter for ICM Monitoring  Patient Name: Jesus Hendrix is a 75 y.o. male Date: 08/30/2021 Primary Care Physican: Maryland Pink, MD Primary Cardiologist: Rockey Situ Electrophysiologist: Allred 07/08/2021 Office Weight: 235 lbs         Transmission reviewed.    CorVue Thoracic impedance suggesting normal fluid levels.   Prescribed: Torsemide 20 mg take 1 tablet twice a day.   Labs: 01/11/2021 Creatinine 2.11, BUN 39, Potassium 4.4, Sodium 140, GFR 30-34 A complete set of results can be found in Results Review.   Recommendations:  No changes.   Follow-up plan: ICM clinic phone appointment on 10/03/2021. 91 day device clinic remote transmission 09/30/2021.       EP/Cardiology Office Visits: Recall 07/10/2021 with Dr Rockey Situ.   Copy of ICM check sent to Dr. Rayann Heman.    3 month ICM trend: 08/29/2021.    1 Year ICM trend:       [name], RN 08/30/2021 2:59 PM

## 2021-09-01 ENCOUNTER — Ambulatory Visit: Admit: 2021-09-01 | Payer: MEDICARE
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
## 2021-09-06 DIAGNOSIS — R339 Retention of urine, unspecified: Secondary | ICD-10-CM | POA: Diagnosis not present

## 2021-09-30 ENCOUNTER — Ambulatory Visit (INDEPENDENT_AMBULATORY_CARE_PROVIDER_SITE_OTHER): Payer: Medicare Other

## 2021-09-30 DIAGNOSIS — I5042 Chronic combined systolic (congestive) and diastolic (congestive) heart failure: Secondary | ICD-10-CM | POA: Diagnosis not present

## 2021-10-03 ENCOUNTER — Ambulatory Visit (INDEPENDENT_AMBULATORY_CARE_PROVIDER_SITE_OTHER): Payer: Medicare Other

## 2021-10-03 DIAGNOSIS — Z950 Presence of cardiac pacemaker: Secondary | ICD-10-CM

## 2021-10-03 DIAGNOSIS — I5042 Chronic combined systolic (congestive) and diastolic (congestive) heart failure: Secondary | ICD-10-CM

## 2021-10-03 LAB — CUP PACEART REMOTE DEVICE CHECK
Battery Remaining Longevity: 68 mo
Battery Remaining Percentage: 67 %
Battery Voltage: 2.98 V
Date Time Interrogation Session: 20221014043132
Implantable Lead Implant Date: 20190419
Implantable Lead Implant Date: 20190419
Implantable Lead Location: 753858
Implantable Lead Location: 753860
Implantable Pulse Generator Implant Date: 20190419
Lead Channel Impedance Value: 430 Ohm
Lead Channel Impedance Value: 800 Ohm
Lead Channel Pacing Threshold Amplitude: 0.875 V
Lead Channel Pacing Threshold Amplitude: 1.375 V
Lead Channel Pacing Threshold Pulse Width: 0.5 ms
Lead Channel Pacing Threshold Pulse Width: 0.8 ms
Lead Channel Sensing Intrinsic Amplitude: 8.5 mV
Lead Channel Setting Pacing Amplitude: 2 V
Lead Channel Setting Pacing Amplitude: 2.375
Lead Channel Setting Pacing Pulse Width: 0.5 ms
Lead Channel Setting Pacing Pulse Width: 0.8 ms
Lead Channel Setting Sensing Sensitivity: 4 mV
Pulse Gen Model: 3262
Pulse Gen Serial Number: 9017264

## 2021-10-04 NOTE — Progress Notes (Signed)
EPIC Encounter for ICM Monitoring  Patient Name: Jesus Hendrix is a 75 y.o. male Date: 10/04/2021 Primary Care Physican: Maryland Pink, MD Primary Cardiologist: Rockey Situ Electrophysiologist: Allred 07/08/2021 Office Weight: 235 lbs         Spoke with wife and heart failure questions reviewed.  Pt asymptomatic for fluid accumulation and feeling fine.   CorVue Thoracic impedance suggesting possible fluid accumulation starting 09/26/2021.   Prescribed: Torsemide 20 mg take 1 tablet twice a day.   Labs: 07/08/2021 Creatinine 2.32, BUN 35, Potassium 4.1, Sodium 137, GFR 28 01/11/2021 Creatinine 2.11, BUN 39, Potassium 4.4, Sodium 140, GFR 30-34 A complete set of results can be found in Results Review.   Recommendations:  Will send to Dr Rockey Situ for review and provide recommendations if needed.   Follow-up plan: ICM clinic phone appointment on 10/12/2021 to recheck fluid levels. 91 day device clinic remote transmission 09/30/2021.       EP/Cardiology Office Visits: Recall 07/10/2021 with Dr Rockey Situ for 6 month follow up.  Overdue appointment with Dr Rayann Heman or Oda Kilts, PA (last visit 08/18/2019).  Message sent to scheduler to call wife to schedule EP visit.   Copy of ICM check sent to Dr. Rayann Heman and Dr Rockey Situ.   3 month ICM trend: 10/03/2021.    1 Year ICM trend:       [name], RN 10/04/2021 8:32 AM

## 2021-10-06 DIAGNOSIS — R339 Retention of urine, unspecified: Secondary | ICD-10-CM | POA: Diagnosis not present

## 2021-10-10 NOTE — Progress Notes (Signed)
Remote pacemaker transmission.   

## 2021-10-12 ENCOUNTER — Ambulatory Visit (INDEPENDENT_AMBULATORY_CARE_PROVIDER_SITE_OTHER): Payer: Medicare Other

## 2021-10-12 ENCOUNTER — Telehealth: Payer: Self-pay

## 2021-10-12 DIAGNOSIS — I5042 Chronic combined systolic (congestive) and diastolic (congestive) heart failure: Secondary | ICD-10-CM

## 2021-10-12 DIAGNOSIS — Z950 Presence of cardiac pacemaker: Secondary | ICD-10-CM

## 2021-10-12 NOTE — Progress Notes (Signed)
EPIC Encounter for ICM Monitoring  Patient Name: Jesus Hendrix is a 75 y.o. male Date: 10/12/2021 Primary Care Physican: Maryland Pink, MD Primary Cardiologist: Rockey Situ Electrophysiologist: Allred 07/08/2021 Office Weight: 235 lbs         Attempted call to wife and unable to reach.  Left message to return call. Transmission reviewed.    CorVue Thoracic impedance suggesting fluid levels returned to normal.   Prescribed: Torsemide 20 mg take 1 tablet twice a day.   Labs: 07/08/2021 Creatinine 2.32, BUN 35, Potassium 4.1, Sodium 137, GFR 28 01/11/2021 Creatinine 2.11, BUN 39, Potassium 4.4, Sodium 140, GFR 30-34 A complete set of results can be found in Results Review.   Recommendations:  Unable to reach.     Follow-up plan: ICM clinic phone appointment on 11/21/2021. 91 day device clinic remote transmission 12/30/2021.       EP/Cardiology Office Visits: Recall 07/10/2021 with Dr Rockey Situ for 6 month follow up.  11/02/2021 with Tommye Standard, PA.   Copy of ICM check sent to Dr. Rayann Heman.  3 month ICM trend: 10/12/2021.    1 Year ICM trend:       [name], RN 10/12/2021 7:54 AM

## 2021-10-12 NOTE — Telephone Encounter (Signed)
Remote ICM transmission received.  Attempted call to wife per DPR regarding ICM remote transmission and left message to return call.  ?

## 2021-10-30 NOTE — Progress Notes (Deleted)
Cardiology Office Note Date:  10/30/2021  Patient ID:  Jesus Hendrix, Jesus Hendrix 17-Dec-1946, MRN [id number] PCP:  Maryland Pink, MD  Cardiologist:  Dr. Rockey Situ Electrophysiologist: Dr. Rayann Heman  ***refresh   Chief Complaint: *** annual visit  History of Present Illness: Jesus Hendrix is a 75 y.o. 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 Known atrophic left kidney, (self caths), COPD,   He comes in today to be seen for Dr. Rayann Heman, last seen by him via telehealth visit Aug 2020, was off a/c by a [facility] provider 2/2 hemoptysis, reported edema the patient suspected was 2/2 steroid s at the time for his autoimmune disease. Cardiac wise doing well, + remotes, noted there were plans to resume [place] once H/H allowed, following with Dr. Rockey Situ for this. Recommended EP APP in a year  Jan 2022, last saw Dr. Rockey Situ, was off all his Wegener's meds, urged he catch up on his f/u at [facility], was on eliquis and tolerating.   SOB felt to be multifactorial, planned to update his echo with prior mod AS and revisit EF, not felt to be a cath candidate  TTE noted LFEV remained 30-35%, mod reduced RV function, RVSP 46.8, AV not well visualized, Dr. Rockey Situ reported same, mod AS.   *** eliquis, bleeding, labs, dose *** Dr. Rockey Situ to manage *** meds, CM *** volume, corvue *** ICD??    Device information SJM CRT-P (RV/LV leads) implanted 04/05/2018   Past Medical History:  Diagnosis Date   Anxiety    Aortic stenosis    a. mild on echo 04/2014   AV block, Mobitz 2    a.  03/2018 s/p SJM Jennette Banker MP RF model CD [id number] BiV PPM (ser #: [id number]).   Choledocholithiasis    Chronic systolic CHF (congestive heart failure) ([place])    a. echo 04/2014: EF 45-50%, nl LV dia fxn, mild AS, LA mildly dilated, PASP nl; b. 07/2019 Echo: EF 35-40%, diff HK, Nl RV fxn, RVSP 53.82mmHg. Sev dil LA. Mod dil RA. Mild to mod MR. Mod TR. Mod AS. Ao root 37cm.   CKD (chronic kidney disease), stage IV (HCC)    COPD (chronic obstructive pulmonary disease) (HCC)    Not on home oxygen   Coronary artery disease    a. status post CABG in 2007; b. nuc stress test 2014: small region of mild ischemia in the inferior territory, EF 61%, no ischemia, scan similar to prior scan in 2009; c. 07/2016 MV: No ischemia.   Exogenous obesity    GERD (gastroesophageal reflux disease)    Gout    Hiatal hernia    Hip pain, chronic 08/03/2016   Waiting to have hip replacement pending NM stress test results per patient.   History of colonic diverticulitis    History of kidney stones    History of renal cell carcinoma    a. s/p laser ablation    Hyperlipidemia    Hypertension    Memory loss due to medical condition    possibly due to fluid on his brain   Moderate aortic stenosis    a. 07/2019 Echo: Mod AS.  Near syncope    a. 3 separate events in 2017   Nephrolithiasis    Panic attacks    Peripheral vascular disease ([place])    Permanent atrial fibrillation ([place]) 06/06/2016   a. Dx 06/06/2016; b. CHADS2VASc => 5 (CHF, HTN, age x 1, DM, vascular disease)-->was on Eliquis but d/c'd 2/2 anemia and hemoptysis.   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[place])     Past Surgical History:  Procedure Laterality Date   CARDIAC CATHETERIZATION     CHOLECYSTECTOMY     COLONOSCOPY N/A 06/03/2015   Procedure: COLONOSCOPY;  Surgeon: Lucilla Lame, MD;   Location: [place];  Service: Gastroenterology;  Laterality: N/A;   CORONARY ARTERY BYPASS GRAFT  2004   CABG x 3   EYE SURGERY     HERNIA REPAIR     x 2   HIP SURGERY     right hip replacement   INTRAOCULAR LENS INSERTION     KIDNEY SURGERY     cancer   NOSE SURGERY     PACEMAKER IMPLANT N/A 04/05/2018   SJM Biventricular pacemaker implanted by Dr Rayann Heman for second degree AV block, EF < 50% and permanent afib   POLYPECTOMY  06/03/2015   Procedure: POLYPECTOMY;  Surgeon: Lucilla Lame, MD;  Location: [place];  Service: Gastroenterology;;   PROSTATE SURGERY     TOTAL HIP ARTHROPLASTY Left 12/19/2016   Procedure: LEFT TOTAL HIP ARTHROPLASTY;  Surgeon: Garald Balding, MD;  Location: [place];  Service: Orthopedics;  Laterality: Left;   VENTRICULOPERITONEAL SHUNT Right 07/08/2021   Procedure: Shunt Placment - right;  Surgeon: Earnie Larsson, MD;  Location: [place];  Service: Neurosurgery;  Laterality: Right;    Current Outpatient Medications  Medication Sig Dispense Refill   acetaminophen (TYLENOL) 325 MG tablet Take 325-650 mg by mouth every 6 (six) hours as needed for pain.     apixaban (ELIQUIS) 5 MG TABS tablet Take 1 tablet (5 mg total) by mouth 2 (two) times daily. 180 tablet 1   clonazePAM (KLONOPIN) 1 MG tablet Take 1 mg by mouth at bedtime.     donepezil (ARICEPT) 5 MG tablet Take 5 mg by mouth at bedtime.     famotidine (PEPCID) 20 MG tablet TAKE 1 TABLET(20 MG) BY MOUTH EVERY DAY AT BEDTIME AS NEEDED FOR HEART BURN 90 tablet 0   fluticasone (FLONASE) 50 MCG/ACT nasal spray Place 1-2 sprays into both nostrils daily as needed for allergies.     HYDROcodone-acetaminophen (NORCO/VICODIN) 5-325 MG tablet Take 1 tablet by mouth every 4 (four) hours as needed for moderate pain ((score 4 to 6)). 30 tablet 0   losartan (COZAAR) 25 MG tablet Take 1 tablet (25 mg total) by mouth daily. 90 tablet 3   meloxicam (MOBIC) 15 MG tablet Take 15 mg by mouth daily as needed for pain.      NON FORMULARY Take 1 capsule by mouth in the morning and at bedtime. Pure Encapsulations Uric Acid Formula     torsemide (DEMADEX) 20 MG tablet TAKE 1 TABLET(20 MG) BY MOUTH TWICE DAILY 180 tablet 3   venlafaxine XR (EFFEXOR-XR) 37.5 MG 24 hr capsule Take 37.5 mg by mouth in the morning.     No current facility-administered medications for this visit.    Allergies:   Clonazepam  Social History:  The patient  reports that he quit smoking about 46 years ago. His smoking use included cigarettes. He has a 12.50 pack-year smoking history. He has never used smokeless tobacco. He reports that he does not currently use alcohol after a past usage of about 2.0 standard drinks per week. He reports that he does not use drugs.   Family History:  The patient's family history includes Heart disease in his brother, mother, and sister.  ROS:  Please see the history of present illness.    All other systems are reviewed and otherwise negative.   PHYSICAL EXAM:  VS:  There were no vitals taken for this visit. BMI: There is no height or weight on file to calculate BMI. Well nourished, well developed, in no acute distress HEENT: normocephalic, atraumatic Neck: no JVD, carotid bruits or masses Cardiac:  *** RRR; no significant murmurs, no rubs, or gallops Lungs:  *** CTA b/l, no wheezing, rhonchi or rales Abd: soft, nontender MS: no deformity or *** atrophy Ext: *** no edema Skin: warm and dry, no rash Neuro:  No gross deficits appreciated Psych: euthymic mood, full affect  *** PPM site is stable, no tethering or discomfort   EKG:  not done today  Device interrogation done today and reviewed by myself:  ***   12/27/2020: TTE IMPRESSIONS   1. Left ventricular ejection fraction, by estimation, is 30 to 35%. The  left ventricle has moderate to severely decreased function. The left  ventricle demonstrates global hypokinesis. Left ventricular diastolic  function could not be evaluated.   2.  Right ventricular systolic function is moderately reduced. The right  ventricular size is normal. There is moderately elevated pulmonary artery  systolic pressure.   3. The mitral valve is grossly normal. No evidence of mitral valve  regurgitation.   4. Tricuspid valve regurgitation not assessed.   5. The aortic valve was not well visualized. Aortic valve regurgitation  is not visualized.   6. Pulmonic valve regurgitation not assessed.    Recent Labs: 01/11/2021: ALT 33 07/08/2021: BUN 35; Creatinine, Ser 2.32; Hemoglobin 11.6; Platelets 283; Potassium 4.1; Sodium 137  No results found for requested labs within last 8760 hours.   CrCl cannot be calculated (Patient's most recent lab result is older than the maximum 21 days allowed.).   Wt Readings from Last 3 Encounters:  07/08/21 235 lb (106.6 kg)  06/13/21 230 lb (104.3 kg)  01/11/21 233 lb (105.7 kg)     Other studies reviewed: Additional studies/records reviewed today include: summarized above  ASSESSMENT AND PLAN:  CRT-P ***  CAD ***  ICM Chronic CHF (systolic) ***  Permanent Afib CHA2DS2Vasc is 4, on *** Eliquis, appropriately dosed ***  Disposition: F/u with ***  Current medicines are reviewed at length with the patient today.  The patient did not have any concerns regarding medicines.  Venetia Night, PA-C 10/30/2021 10:40 AM     [facility] [address] [place] [address] [phone] (office)  [phone] (fax)

## 2021-10-31 ENCOUNTER — Encounter: Payer: Medicare Other | Admitting: Internal Medicine

## 2021-11-02 ENCOUNTER — Encounter: Payer: Medicare Other | Admitting: Physician Assistant

## 2021-11-02 ENCOUNTER — Other Ambulatory Visit: Payer: Self-pay

## 2021-11-02 DIAGNOSIS — G910 Communicating hydrocephalus: Secondary | ICD-10-CM | POA: Diagnosis not present

## 2021-11-07 DIAGNOSIS — R339 Retention of urine, unspecified: Secondary | ICD-10-CM | POA: Diagnosis not present

## 2021-11-21 ENCOUNTER — Ambulatory Visit (INDEPENDENT_AMBULATORY_CARE_PROVIDER_SITE_OTHER): Payer: Medicare Other

## 2021-11-21 DIAGNOSIS — Z950 Presence of cardiac pacemaker: Secondary | ICD-10-CM | POA: Diagnosis not present

## 2021-11-21 DIAGNOSIS — I5042 Chronic combined systolic (congestive) and diastolic (congestive) heart failure: Secondary | ICD-10-CM | POA: Diagnosis not present

## 2021-11-25 NOTE — Progress Notes (Signed)
EPIC Encounter for ICM Monitoring  Patient Name: Jesus Hendrix is a 75 y.o. male Date: 11/25/2021 Primary Care Physican: Maryland Pink, MD Primary Cardiologist: Rockey Situ Electrophysiologist: Allred Last Office Weight: 235 lbs         Transmission reviewed.    CorVue Thoracic impedance suggesting fluid levels returned to normal.   Prescribed: Torsemide 20 mg take 1 tablet twice a day.   Labs: 07/08/2021 Creatinine 2.32, BUN 35, Potassium 4.1, Sodium 137, GFR 28 01/11/2021 Creatinine 2.11, BUN 39, Potassium 4.4, Sodium 140, GFR 30-34 A complete set of results can be found in Results Review.   Recommendations:  No changes   Follow-up plan: ICM clinic phone appointment on 12/26/2021.     91 day device clinic remote transmission 12/30/2021.       EP/Cardiology Office Visits: Recall 07/10/2021 with Dr Rockey Situ for 6 month follow up.  12/26/2021 with Tommye Standard, PA.   Copy of ICM check sent to Dr. Rayann Heman.  3 month ICM trend: 11/21/2021.    12-14 Month ICM trend:       [name], RN 11/25/2021 8:49 AM

## 2021-12-07 DIAGNOSIS — R339 Retention of urine, unspecified: Secondary | ICD-10-CM | POA: Diagnosis not present

## 2021-12-26 ENCOUNTER — Ambulatory Visit (INDEPENDENT_AMBULATORY_CARE_PROVIDER_SITE_OTHER): Payer: Medicare Other

## 2021-12-26 ENCOUNTER — Encounter: Payer: Medicare Other | Admitting: Physician Assistant

## 2021-12-26 DIAGNOSIS — Z950 Presence of cardiac pacemaker: Secondary | ICD-10-CM

## 2021-12-26 DIAGNOSIS — I5042 Chronic combined systolic (congestive) and diastolic (congestive) heart failure: Secondary | ICD-10-CM | POA: Diagnosis not present

## 2021-12-26 LAB — CUP PACEART REMOTE DEVICE CHECK
Battery Remaining Longevity: 65 mo
Battery Remaining Percentage: 65 %
Battery Voltage: 2.98 V
Date Time Interrogation Session: 20230109020016
Implantable Lead Implant Date: 20190419
Implantable Lead Implant Date: 20190419
Implantable Lead Location: 753858
Implantable Lead Location: 753860
Implantable Pulse Generator Implant Date: 20190419
Lead Channel Impedance Value: 430 Ohm
Lead Channel Impedance Value: 840 Ohm
Lead Channel Pacing Threshold Amplitude: 0.875 V
Lead Channel Pacing Threshold Amplitude: 1.25 V
Lead Channel Pacing Threshold Pulse Width: 0.5 ms
Lead Channel Pacing Threshold Pulse Width: 0.8 ms
Lead Channel Sensing Intrinsic Amplitude: 12 mV
Lead Channel Setting Pacing Amplitude: 2 V
Lead Channel Setting Pacing Amplitude: 2.25 V
Lead Channel Setting Pacing Pulse Width: 0.5 ms
Lead Channel Setting Pacing Pulse Width: 0.8 ms
Lead Channel Setting Sensing Sensitivity: 4 mV
Pulse Gen Model: 3262
Pulse Gen Serial Number: 9017264

## 2021-12-28 NOTE — Progress Notes (Signed)
EPIC Encounter for ICM Monitoring  Patient Name: Jesus Hendrix is a 76 y.o. male Date: 12/28/2021 Primary Care Physican: Maryland Pink, MD Primary Cardiologist: Rockey Situ Electrophysiologist: Allred Last Office Weight: 235 lbs         Transmission reviewed.    CorVue Thoracic impedance suggesting normal fluid levels.   Prescribed: Torsemide 20 mg take 1 tablet twice a day.   Labs: 07/08/2021 Creatinine 2.32, BUN 35, Potassium 4.1, Sodium 137, GFR 28 01/11/2021 Creatinine 2.11, BUN 39, Potassium 4.4, Sodium 140, GFR 30-34 A complete set of results can be found in Results Review.   Recommendations:  No changes   Follow-up plan: ICM clinic phone appointment on 12/26/2021.     91 day device clinic remote transmission 12/30/2021.       EP/Cardiology Office Visits: Recall 07/10/2021 with Dr Rockey Situ for 6 month follow up.  01/10/2022 with Tommye Standard, PA.   Copy of ICM check sent to Dr. Rayann Heman.  3 month ICM trend: 12/26/2021.    12-14 Month ICM trend:     [name], RN 12/28/2021 4:54 PM

## 2021-12-30 ENCOUNTER — Ambulatory Visit (INDEPENDENT_AMBULATORY_CARE_PROVIDER_SITE_OTHER): Payer: Medicare Other

## 2021-12-30 DIAGNOSIS — I442 Atrioventricular block, complete: Secondary | ICD-10-CM

## 2022-01-06 DIAGNOSIS — R339 Retention of urine, unspecified: Secondary | ICD-10-CM | POA: Diagnosis not present

## 2022-01-08 NOTE — Progress Notes (Deleted)
Cardiology Office Note Date:  01/08/2022  Patient ID:  Jesus Hendrix, Jesus Hendrix January 29, 1946, MRN [id number] PCP:  Maryland Pink, MD  Cardiologist:  Dr. Felipa Emory Electrophysiologist: Dr. Rayann Heman  ***refresh   Chief Complaint: *** over due device visit  History of Present Illness: Jesus Hendrix is a 76 y.o. male with history of CAD (CABG 2007), HLD, kidney cancer, hs hx of  laser ablation at [facility], ANCA associated vasculitis, Wegener's granulomatosis, p.HTN, permanent AFib, chronic CHF (systolic), ICM, heart block with CRT-P   He comes in today to be seen for Dr. Rayann Heman, last seen by him via tele health visit Aug 2020, reported some edema that the pt attributed to recent high dose steroids for his autoimmune disease.  Had recently been taken off Eliquis 2/2 hemoptysis. Had no cardiac complaints Had missed some remotes with plans t get back on track with this. Also missed f/u CBC and visit with Dr. Rockey Situ, urged to f/u and visit resumption of a/c.  He has followed with cardiology team, last was with Dr. Rockey Situ Jan 2022, seems on again off again with medication compliance and f/u with his specialists as well as reports of waxing/waning edema in relation with poor compliance with his self-cathing No clear or acute cardiac issues SOB suspected multifactorial 2/2 deconditioning, ICM, mod AS NOT felt a cardiac cath candidate, planned to update his echo, labs No changes were made.  He is followed by L. Short/ICM clinic + remotes  *** symptoms *** volume, cathing, urine OP? *** meds, CM, CAD *** eliquis, bleeding, dose *** change to ICD?? *** over due for cards    Device information Abbott CRT-P implanted 04/05/2018   Past Medical History:  Diagnosis Date   Anxiety    Aortic stenosis    a. mild on echo 04/2014   AV block, Mobitz 2    a. 03/2018 s/p SJM Jennette Banker MP RF model CD [id number] BiV PPM (ser #: [id number]).   Choledocholithiasis    Chronic systolic CHF (congestive heart failure)  ([place])    a. echo 04/2014: EF 45-50%, nl LV dia fxn, mild AS, LA mildly dilated, PASP nl; b. 07/2019 Echo: EF 35-40%, diff HK, Nl RV fxn, RVSP 53.21mmHg. Sev dil LA. Mod dil RA. Mild to mod MR. Mod TR. Mod AS. Ao root 37cm.   CKD (chronic kidney disease), stage IV (HCC)    COPD (chronic obstructive pulmonary disease) (HCC)    Not on home oxygen   Coronary artery disease    a. status post CABG in 2007; b. nuc stress test 2014: small region of mild ischemia in the inferior territory, EF 61%, no ischemia, scan similar to prior scan in 2009; c. 07/2016 MV: No ischemia.   Exogenous obesity    GERD (gastroesophageal reflux disease)    Gout    Hiatal hernia    Hip pain, chronic 08/03/2016   Waiting to have hip replacement pending NM stress test results per patient.   History of colonic diverticulitis    History of kidney stones    History of renal cell carcinoma    a. s/p laser ablation    Hyperlipidemia    Hypertension    Memory loss due to medical condition    possibly due to fluid on his brain   Moderate aortic stenosis    a. 07/2019 Echo: Mod AS.   Near syncope    a. 3 separate events in 2017   Nephrolithiasis    Panic attacks    Peripheral vascular  disease [facility])    Permanent atrial fibrillation ([place]) 06/06/2016   a. Dx 06/06/2016; b. CHADS2VASc => 5 (CHF, HTN, age x 1, DM, vascular disease)-->was on Eliquis but d/c'd 2/2 anemia and hemoptysis.   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[place])     Past Surgical History:  Procedure Laterality Date   CARDIAC CATHETERIZATION     CHOLECYSTECTOMY     COLONOSCOPY N/A 06/03/2015   Procedure: COLONOSCOPY;  Surgeon: Lucilla Lame, MD;  Location: [place];  Service: Gastroenterology;  Laterality: N/A;   CORONARY ARTERY BYPASS GRAFT  2004   CABG x 3   EYE SURGERY      HERNIA REPAIR     x 2   HIP SURGERY     right hip replacement   INTRAOCULAR LENS INSERTION     KIDNEY SURGERY     cancer   NOSE SURGERY     PACEMAKER IMPLANT N/A 04/05/2018   SJM Biventricular pacemaker implanted by Dr Rayann Heman for second degree AV block, EF < 50% and permanent afib   POLYPECTOMY  06/03/2015   Procedure: POLYPECTOMY;  Surgeon: Lucilla Lame, MD;  Location: [place];  Service: Gastroenterology;;   PROSTATE SURGERY     TOTAL HIP ARTHROPLASTY Left 12/19/2016   Procedure: LEFT TOTAL HIP ARTHROPLASTY;  Surgeon: Garald Balding, MD;  Location: [place];  Service: Orthopedics;  Laterality: Left;   VENTRICULOPERITONEAL SHUNT Right 07/08/2021   Procedure: Shunt Placment - right;  Surgeon: Earnie Larsson, MD;  Location: [place];  Service: Neurosurgery;  Laterality: Right;    Current Outpatient Medications  Medication Sig Dispense Refill   acetaminophen (TYLENOL) 325 MG tablet Take 325-650 mg by mouth every 6 (six) hours as needed for pain.     apixaban (ELIQUIS) 5 MG TABS tablet Take 1 tablet (5 mg total) by mouth 2 (two) times daily. 180 tablet 1   clonazePAM (KLONOPIN) 1 MG tablet Take 1 mg by mouth at bedtime.     donepezil (ARICEPT) 5 MG tablet Take 5 mg by mouth at bedtime.     famotidine (PEPCID) 20 MG tablet TAKE 1 TABLET(20 MG) BY MOUTH EVERY DAY AT BEDTIME AS NEEDED FOR HEART BURN 90 tablet 0   fluticasone (FLONASE) 50 MCG/ACT nasal spray Place 1-2 sprays into both nostrils daily as needed for allergies.     HYDROcodone-acetaminophen (NORCO/VICODIN) 5-325 MG tablet Take 1 tablet by mouth every 4 (four) hours as needed for moderate pain ((score 4 to 6)). 30 tablet 0   losartan (COZAAR) 25 MG tablet Take 1 tablet (25 mg total) by mouth daily. 90 tablet 3   meloxicam (MOBIC) 15 MG tablet Take 15 mg by mouth daily as needed for pain.     NON FORMULARY Take 1 capsule by mouth in the morning and at bedtime. Pure Encapsulations Uric Acid Formula     torsemide (DEMADEX) 20 MG  tablet TAKE 1 TABLET(20 MG) BY MOUTH TWICE DAILY 180 tablet 3   venlafaxine XR (EFFEXOR-XR) 37.5 MG 24 hr capsule Take 37.5 mg by mouth in the morning.     No current facility-administered medications for this visit.    Allergies:   Clonazepam   Social History:  The patient  reports that he quit smoking about 47 years ago. His smoking use included cigarettes. He has a 12.50  pack-year smoking history. He has never used smokeless tobacco. He reports that he does not currently use alcohol after a past usage of about 2.0 standard drinks per week. He reports that he does not use drugs.   Family History:  The patient's family history includes Heart disease in his brother, mother, and sister.  ROS:  Please see the history of present illness.    All other systems are reviewed and otherwise negative.   PHYSICAL EXAM:  VS:  There were no vitals taken for this visit. BMI: There is no height or weight on file to calculate BMI. Well nourished, well developed, in no acute distress HEENT: normocephalic, atraumatic Neck: no JVD, carotid bruits or masses Cardiac:  *** RRR; no significant murmurs, no rubs, or gallops Lungs:  *** CTA b/l, no wheezing, rhonchi or rales Abd: soft, nontender MS: no deformity or *** atrophy Ext: *** no edema Skin: warm and dry, no rash Neuro:  No gross deficits appreciated Psych: euthymic mood, full affect  *** PPM site is stable, no tethering or discomfort   EKG:  Done today and reviewed by myself shows  ***  Device interrogation done today and reviewed by myself:  ***  01/27/2021: TTE IMPRESSIONS   1. Left ventricular ejection fraction, by estimation, is 30 to 35%. The  left ventricle has moderate to severely decreased function. The left  ventricle demonstrates global hypokinesis. Left ventricular diastolic  function could not be evaluated.   2. Right ventricular systolic function is moderately reduced. The right  ventricular size is normal. There is  moderately elevated pulmonary artery  systolic pressure.   3. The mitral valve is grossly normal. No evidence of mitral valve  regurgitation.   4. Tricuspid valve regurgitation not assessed.   5. The aortic valve was not well visualized. Aortic valve regurgitation  is not visualized.   6. Pulmonic valve regurgitation not assessed.    08/15/2019; LVEF 35-40% 04/05/2018: LVEF 45-50%   Recent Labs: 01/11/2021: ALT 33 07/08/2021: BUN 35; Creatinine, Ser 2.32; Hemoglobin 11.6; Platelets 283; Potassium 4.1; Sodium 137  No results found for requested labs within last 8760 hours.   CrCl cannot be calculated (Patient's most recent lab result is older than the maximum 21 days allowed.).   Wt Readings from Last 3 Encounters:  07/08/21 235 lb (106.6 kg)  06/13/21 230 lb (104.3 kg)  01/11/21 233 lb (105.7 kg)     Other studies reviewed: Additional studies/records reviewed today include: summarized above  ASSESSMENT AND PLAN:  CRT-P ***  Permanent AFib CHA2DS2Vasc is 3, on Eliquis, *** appropriately dosed ***  Chronic CHF (systolic) ICM ***  CAD ***  Disposition: F/u with ***  Current medicines are reviewed at length with the patient today.  The patient did not have any concerns regarding medicines.  Venetia Night, PA-C 01/08/2022 10:00 AM     [place] [place] [address] [place] [address] [phone] (office)  [phone] (fax)

## 2022-01-10 ENCOUNTER — Encounter: Payer: Medicare Other | Admitting: Physician Assistant

## 2022-01-10 NOTE — Progress Notes (Signed)
Remote pacemaker transmission.   

## 2022-01-30 ENCOUNTER — Ambulatory Visit (INDEPENDENT_AMBULATORY_CARE_PROVIDER_SITE_OTHER): Payer: Medicare Other

## 2022-01-30 DIAGNOSIS — Z950 Presence of cardiac pacemaker: Secondary | ICD-10-CM | POA: Diagnosis not present

## 2022-01-30 DIAGNOSIS — I5042 Chronic combined systolic (congestive) and diastolic (congestive) heart failure: Secondary | ICD-10-CM | POA: Diagnosis not present

## 2022-02-01 NOTE — Progress Notes (Signed)
EPIC Encounter for ICM Monitoring  Patient Name: Jesus Hendrix is a 76 y.o. male Date: 02/01/2022 Primary Care Physican: Maryland Pink, MD Primary Cardiologist: Rockey Situ Electrophysiologist: Allred Last Office Weight: 235 lbs         Transmission reviewed.    CorVue Thoracic impedance suggesting normal fluid levels but was suggesting possible fluid accumulation from 1/20-1/28.    Prescribed: Torsemide 20 mg take 1 tablet twice a day.   Labs: 07/08/2021 Creatinine 2.32, BUN 35, Potassium 4.1, Sodium 137, GFR 28 01/11/2021 Creatinine 2.11, BUN 39, Potassium 4.4, Sodium 140, GFR 30-34 A complete set of results can be found in Results Review.   Recommendations:  No changes   Follow-up plan: ICM clinic phone appointment on 03/06/2022.     91 day device clinic remote transmission 03/31/2022.       EP/Cardiology Office Visits: Recall 07/10/2021 with Dr Rockey Situ for 6 month follow up.  Canceled 01/10/2022 with Tommye Standard, PA and has not rescheduled.   Copy of ICM check sent to Dr. Rayann Heman.  3 month ICM trend: 01/30/2022.    12-14 Month ICM trend:     [name], RN 02/01/2022 10:57 AM

## 2022-02-06 DIAGNOSIS — R339 Retention of urine, unspecified: Secondary | ICD-10-CM | POA: Diagnosis not present

## 2022-02-13 ENCOUNTER — Other Ambulatory Visit: Payer: Self-pay | Admitting: Cardiovascular Disease

## 2022-02-13 NOTE — Telephone Encounter (Signed)
Please contact pt for future appointment. Pt overdue for 6 month f/u. Pt needing refills. 

## 2022-03-06 ENCOUNTER — Ambulatory Visit (INDEPENDENT_AMBULATORY_CARE_PROVIDER_SITE_OTHER): Payer: Medicare Other

## 2022-03-06 DIAGNOSIS — Z950 Presence of cardiac pacemaker: Secondary | ICD-10-CM

## 2022-03-06 DIAGNOSIS — I5042 Chronic combined systolic (congestive) and diastolic (congestive) heart failure: Secondary | ICD-10-CM

## 2022-03-10 NOTE — Progress Notes (Signed)
EPIC Encounter for ICM Monitoring ? ?Patient Name: Jesus Hendrix is a 76 y.o. male ?Date: 03/10/2022 ?Primary Care Physican: Maryland Pink, MD ?Primary Cardiologist: Rockey Situ ?Electrophysiologist: Allred ?Last Office Weight: 235 lbs ?  ?      Transmission reviewed.  ?  ?CorVue Thoracic impedance suggesting normal fluid levels but was suggesting possible fluid accumulation from 3/5-3/15.  ?  ?Prescribed: Torsemide 20 mg take 1 tablet twice a day. ?  ?Labs: ?07/08/2021 Creatinine 2.32, BUN 35, Potassium 4.1, Sodium 137, GFR 28 ?01/11/2021 Creatinine 2.11, BUN 39, Potassium 4.4, Sodium 140, GFR 30-34 ?A complete set of results can be found in Results Review. ?  ?Recommendations:  No changes ?  ?Follow-up plan: ICM clinic phone appointment on 04/10/2022.     91 day device clinic remote transmission 03/31/2022.     ?  ?EP/Cardiology Office Visits: 03/14/2022 with Dr Rockey Situ.  Canceled 01/10/2022 with Tommye Standard, PA and has not rescheduled. ?  ?Copy of ICM check sent to Dr. Rayann Heman. ? ?3 month ICM trend: 03/06/2022. ? ? ? ?12-14 Month ICM trend:  ? ? ? ?[name], RN ?03/10/2022 ?2:52 PM ? ?

## 2022-03-13 NOTE — Progress Notes (Addendum)
[name], male   DOB: [birth year], 76 y.o.   MRN: [id number] ?Cardiology Office Note ? ?Date:  03/14/2022  ? ?ID:  Jesus Hendrix, DOB [birth year], MRN [id number] ? ?PCP:  Maryland Pink, MD  ? ?Chief Complaint  ?Patient presents with  ? 6 month follow up   ?  "Doing well." Medications reviewed by the patient verbally.   ? ? ?HPI:  ?Jesus Hendrix is a 76 year old gentleman with  ?Persistent atrial fibrillation ?chronic back pain and sciatica down his left leg,  ?coronary artery disease and bypass surgery in 2007,  ?hyperlipidemia,  ?obesity,  ?kidney cancer,  ?status post laser ablation at [facility], ANCA associated vasculitis, Wegener's granulomatosis,  ?previous history of dizziness which he attributes to his Wegener's ?followed by [facility] nephrology ?-Medication noncompliance ?EF 35%, pulm HTN ?who presents for routine followup of his coronary artery disease, atrial fibrillation, heart block with pacer, Wegener's, hemoptysis, blood loss ? ?In follow-up today presents with his wife ?She reports that his weight has been trending upwards, he is sedentary, no regular exercise program ?He is more hard of hearing, getting forgetful ? ?He would like to go back to work ,work on his Administrator, sports.  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 ?Denies significant leg swelling ?Legs weak, no falls ? ?Ventricularly paced rhythm rate 60 bpm ?Unchanged from prior study ? ?Echo 2/22 reviewed ? 1. Left ventricular ejection fraction, by estimation, is 30 to 35%. The  ?left ventricle has moderate to severely decreased function. The left  ?ventricle demonstrates global hypokinesis. Left ventricular diastolic  ?function could not be evaluated.  ? 2. Right ventricular systolic function is moderately reduced. The right  ?ventricular size is normal. There is moderately elevated pulmonary artery  ?systolic pressure.  ? 3. The mitral valve is grossly normal. No evidence of mitral valve  ?regurgitation.  ? 4. Tricuspid valve regurgitation not assessed.  ? 5. The  aortic valve was not well visualized. Aortic valve regurgitation  ?is not visualized.  ? ?Off all wegeners medications ? ?Stress test 10/20,  ?Defect 1: There is a medium defect of moderate severity present in the apical inferior and apex location. This defect is fixed and likely represent a previous infarct. ?This is an intermediate risk study mainly due to reduced EF. ?Nuclear stress EF: 37%. ?No reversible ischemia is noted. ? ? ?Prior echocardiogram reviewed with him showing moderate aortic valve stenosis mean gradient 20 mmHg ? ?  heart rate in the 30s, transferred to [facility],  ?Pacer placed for complete heart block ?switch to eliquis ?  ?Chronic severe back pain, walking with a limp ?Till working, Data processing manager, Lexicographer and sewage ?Followed by  Dr. Carloyn Manner,  Neurosurgery ?  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 Known atrophic left kidney. ? ?cardiac catheterization in January 2007 details 75-80% long lesion in the LAD at the ostium, 95% lesion in the PDA. He was referred to bypass surgery. ?Previous echocardiogram January 2009 showed mild MR, normal LV function, aortic valve sclerosis without significant stenosis ? ?PMH:   has a past medical history of Anxiety, Aortic stenosis, AV block, Mobitz 2, Choledocholithiasis, Chronic systolic CHF (congestive heart failure) (HCC), CKD (chronic kidney disease), stage IV (HCC), COPD (chronic obstructive pulmonary disease) ([place]), Coronary artery disease, Exogenous obesity, GERD (gastroesophageal reflux disease), Gout, Hiatal hernia, Hip pain, chronic (  08/03/2016), History of colonic diverticulitis, History of kidney stones, History of renal cell carcinoma, Hyperlipidemia, Hypertension, Memory loss due to medical condition, Moderate aortic stenosis, Near  syncope, Nephrolithiasis, Panic attacks, Peripheral vascular disease ([place]), Permanent atrial fibrillation ([place]) (06/06/2016), Presence of permanent cardiac pacemaker, Spinal stenosis, Temporomandibular joint pain dysfunction syndrome, Umbilical hernia, Vitamin D deficiency, and Wegener's granulomatosis with renal involvement ([place]). ? ?PSH:    ?Past Surgical History:  ?Procedure Laterality Date  ? CARDIAC CATHETERIZATION    ? CHOLECYSTECTOMY    ? COLONOSCOPY N/A 06/03/2015  ? Procedure: COLONOSCOPY;  Surgeon: Lucilla Lame, MD;  Location: [place];  Service: Gastroenterology;  Laterality: N/A;  ? CORONARY ARTERY BYPASS GRAFT  2004  ? CABG x 3  ? EYE SURGERY    ? HERNIA REPAIR    ? x 2  ? HIP SURGERY    ? right hip replacement  ? INTRAOCULAR LENS INSERTION    ? KIDNEY SURGERY    ? cancer  ? NOSE SURGERY    ? PACEMAKER IMPLANT N/A 04/05/2018  ? SJM Biventricular pacemaker implanted by Dr Rayann Heman for second degree AV block, EF < 50% and permanent afib  ? POLYPECTOMY  06/03/2015  ? Procedure: POLYPECTOMY;  Surgeon: Lucilla Lame, MD;  Location: [place];  Service: Gastroenterology;;  ? PROSTATE SURGERY    ? TOTAL HIP ARTHROPLASTY Left 12/19/2016  ? Procedure: LEFT TOTAL HIP ARTHROPLASTY;  Surgeon: Garald Balding, MD;  Location: [place];  Service: Orthopedics;  Laterality: Left;  ? VENTRICULOPERITONEAL SHUNT Right 07/08/2021  ? Procedure: Shunt Placment - right;  Surgeon: Earnie Larsson, MD;  Location: [place];  Service: Neurosurgery;  Laterality: Right;  ? ? ?Current Outpatient Medications  ?Medication Sig Dispense Refill  ? acetaminophen (TYLENOL) 325 MG tablet Take 325-650 mg by mouth every 6 (six) hours as needed for pain.    ? apixaban (ELIQUIS) 5 MG TABS tablet Take 1 tablet (5 mg total) by mouth 2 (two) times daily. 180 tablet 1  ? clonazePAM (KLONOPIN) 1 MG tablet Take 1 mg by mouth at bedtime.    ? donepezil (ARICEPT) 5 MG tablet Take 5 mg by mouth at bedtime.    ? famotidine (PEPCID) 20 MG tablet TAKE 1  TABLET(20 MG) BY MOUTH EVERY DAY AT BEDTIME AS NEEDED FOR HEART BURN 90 tablet 0  ? fluticasone (FLONASE) 50 MCG/ACT nasal spray Place 1-2 sprays into both nostrils daily as needed for allergies.    ? HYDROcodone-acetaminophen (NORCO/VICODIN) 5-325 MG tablet Take 1 tablet by mouth every 4 (four) hours as needed for moderate pain ((score 4 to 6)). 30 tablet 0  ? losartan (COZAAR) 25 MG tablet TAKE 1 TABLET(25 MG) BY MOUTH DAILY 30 tablet 0  ? meloxicam (MOBIC) 15 MG tablet Take 15 mg by mouth daily as needed for pain.    ? NON FORMULARY Take 1 capsule by mouth in the morning and at bedtime. Pure Encapsulations Uric Acid Formula    ? torsemide (DEMADEX) 20 MG tablet TAKE 1 TABLET(20 MG) BY MOUTH TWICE DAILY 180 tablet 3  ? venlafaxine XR (EFFEXOR-XR) 37.5 MG 24 hr capsule Take 37.5 mg by mouth in the morning.    ? ?No current facility-administered medications for this visit.  ? ? ? ?Allergies:   Clonazepam  ? ?Social History:  The patient  reports that he quit smoking about 47 years ago. His smoking use included cigarettes. He has a 12.50 pack-year smoking history. He has never used smokeless tobacco. He reports that  he does not currently use alcohol after a past usage of about 2.0 standard drinks per week. He reports that he does not use drugs.  ? ?Family History:   family history includes Heart disease in his brother, mother, and sister.  ? ? ?Review of Systems: ?Review of Systems  ?Constitutional: Negative.   ?HENT: Negative.    ?Respiratory:  Positive for shortness of breath.   ?Cardiovascular: Negative.   ?Gastrointestinal: Negative.   ?Musculoskeletal:  Positive for back pain.  ?Neurological: Negative.   ?All other systems reviewed and are negative. ? ?PHYSICAL EXAM: ?VS:  BP 120/60 (BP Location: Left Arm, Patient Position: Sitting, Cuff Size: Normal)   Pulse 60   Ht '5\' 6"'$  (1.676 m)   Wt 236 lb 4 oz (107.2 kg)   SpO2 98%   BMI 38.13 kg/m?  , BMI Body mass index is 38.13 kg/m?Marland Kitchen ?Constitutional:  oriented to  person, place, and time. No distress.  ?HENT:  ?Head: Grossly normal ?Eyes:  no discharge. No scleral icterus.  ?Neck: No JVD, no carotid bruits  ?Cardiovascular: Regular rate and rhythm, no murmurs appreciated

## 2022-03-14 ENCOUNTER — Encounter: Payer: Self-pay | Admitting: Cardiovascular Disease

## 2022-03-14 ENCOUNTER — Other Ambulatory Visit: Payer: Self-pay

## 2022-03-14 ENCOUNTER — Ambulatory Visit: Payer: Medicare Other | Admitting: Cardiovascular Disease

## 2022-03-14 VITALS — BP 120/60 | HR 60 | Ht 66.0 in | Wt 236.2 lb

## 2022-03-14 DIAGNOSIS — Z95818 Presence of other cardiac implants and grafts: Secondary | ICD-10-CM

## 2022-03-14 DIAGNOSIS — R0602 Shortness of breath: Secondary | ICD-10-CM

## 2022-03-14 DIAGNOSIS — I10 Essential (primary) hypertension: Secondary | ICD-10-CM

## 2022-03-14 DIAGNOSIS — Z950 Presence of cardiac pacemaker: Secondary | ICD-10-CM

## 2022-03-14 DIAGNOSIS — I5022 Chronic systolic (congestive) heart failure: Secondary | ICD-10-CM

## 2022-03-14 DIAGNOSIS — I5032 Chronic diastolic (congestive) heart failure: Secondary | ICD-10-CM | POA: Diagnosis not present

## 2022-03-14 DIAGNOSIS — I442 Atrioventricular block, complete: Secondary | ICD-10-CM | POA: Diagnosis not present

## 2022-03-14 DIAGNOSIS — I350 Nonrheumatic aortic (valve) stenosis: Secondary | ICD-10-CM

## 2022-03-14 DIAGNOSIS — I25118 Atherosclerotic heart disease of native coronary artery with other forms of angina pectoris: Secondary | ICD-10-CM

## 2022-03-14 DIAGNOSIS — I4821 Permanent atrial fibrillation: Secondary | ICD-10-CM

## 2022-03-14 DIAGNOSIS — I4819 Other persistent atrial fibrillation: Secondary | ICD-10-CM

## 2022-03-14 DIAGNOSIS — E785 Hyperlipidemia, unspecified: Secondary | ICD-10-CM

## 2022-03-14 DIAGNOSIS — I5042 Chronic combined systolic (congestive) and diastolic (congestive) heart failure: Secondary | ICD-10-CM

## 2022-03-14 DIAGNOSIS — N184 Chronic kidney disease, stage 4 (severe): Secondary | ICD-10-CM

## 2022-03-14 NOTE — Patient Instructions (Addendum)
Medication Instructions:  Your physician recommends that you continue on your current medications as directed. Please refer to the Current Medication list given to you today.  If you need a refill on your cardiac medications before your next appointment, please call your pharmacy.   Lab work: No new labs needed  Testing/Procedures: No new testing needed  Follow-Up: At [facility], you and your health needs are our priority.  As part of our continuing mission to provide you with exceptional heart care, we have created designated Provider Care Teams.  These Care Teams include your primary Cardiologist (physician) and Advanced Practice Providers (APPs -  Physician Assistants and Nurse Practitioners) who all work together to provide you with the care you need, when you need it.  You will need a follow up appointment in 12 months  Providers on your designated Care Team:   Christopher Berge, NP Ryan Dunn, PA-C Cadence Furth, PA-C  COVID-19 Vaccine Information can be found at: [email or web address] For questions related to vaccine distribution or appointments, please email [email or web address] or call [phone].   

## 2022-03-21 ENCOUNTER — Other Ambulatory Visit: Payer: Self-pay | Admitting: Cardiovascular Disease

## 2022-03-31 ENCOUNTER — Ambulatory Visit (INDEPENDENT_AMBULATORY_CARE_PROVIDER_SITE_OTHER): Payer: Medicare Other

## 2022-03-31 DIAGNOSIS — I420 Dilated cardiomyopathy: Secondary | ICD-10-CM

## 2022-03-31 DIAGNOSIS — I5042 Chronic combined systolic (congestive) and diastolic (congestive) heart failure: Secondary | ICD-10-CM | POA: Diagnosis not present

## 2022-03-31 LAB — CUP PACEART REMOTE DEVICE CHECK
Battery Remaining Longevity: 63 mo
Battery Remaining Percentage: 62 %
Battery Voltage: 2.98 V
Date Time Interrogation Session: 20230414020017
Implantable Lead Implant Date: 20190419
Implantable Lead Implant Date: 20190419
Implantable Lead Location: 753858
Implantable Lead Location: 753860
Implantable Pulse Generator Implant Date: 20190419
Lead Channel Impedance Value: 430 Ohm
Lead Channel Impedance Value: 780 Ohm
Lead Channel Pacing Threshold Amplitude: 0.875 V
Lead Channel Pacing Threshold Amplitude: 1.25 V
Lead Channel Pacing Threshold Pulse Width: 0.5 ms
Lead Channel Pacing Threshold Pulse Width: 0.8 ms
Lead Channel Sensing Intrinsic Amplitude: 11.8 mV
Lead Channel Setting Pacing Amplitude: 2 V
Lead Channel Setting Pacing Amplitude: 2.25 V
Lead Channel Setting Pacing Pulse Width: 0.5 ms
Lead Channel Setting Pacing Pulse Width: 0.8 ms
Lead Channel Setting Sensing Sensitivity: 4 mV
Pulse Gen Model: 3262
Pulse Gen Serial Number: 9017264

## 2022-04-10 ENCOUNTER — Ambulatory Visit (INDEPENDENT_AMBULATORY_CARE_PROVIDER_SITE_OTHER): Payer: Medicare Other

## 2022-04-10 DIAGNOSIS — Z950 Presence of cardiac pacemaker: Secondary | ICD-10-CM | POA: Diagnosis not present

## 2022-04-10 DIAGNOSIS — I5042 Chronic combined systolic (congestive) and diastolic (congestive) heart failure: Secondary | ICD-10-CM

## 2022-04-12 NOTE — Progress Notes (Signed)
EPIC Encounter for ICM Monitoring ? ?Patient Name: Jesus Hendrix is a 76 y.o. male ?Date: 04/12/2022 ?Primary Care Physican: Maryland Pink, MD ?Primary Cardiologist: Rockey Situ ?Electrophysiologist: Allred ?Last Office Weight: 235 lbs ?  ?      Transmission reviewed.  ?  ?CorVue Thoracic impedance suggesting normal fluid levels with intermittent days of possible fluid accumulation in the last month.  ?  ?Prescribed: Torsemide 20 mg take 1 tablet twice a day. ?  ?Labs: ?07/08/2021 Creatinine 2.32, BUN 35, Potassium 4.1, Sodium 137, GFR 28 ?01/11/2021 Creatinine 2.11, BUN 39, Potassium 4.4, Sodium 140, GFR 30-34 ?A complete set of results can be found in Results Review. ?  ?Recommendations:  No changes ?  ?Follow-up plan: ICM clinic phone appointment on 05/22/2022.     91 day device clinic remote transmission 06/30/2022.     ?  ?EP/Cardiology Office Visits:  Recall 03/09/2023 with Dr Rockey Situ.  Message sent to EP scheduler 4/26 to contact patient regarding overdue EP visit.  Canceled 01/10/2022 with Tommye Standard, PA and has not rescheduled. ?  ?Copy of ICM check sent to Dr. Rayann Heman. ? ?3 month ICM trend: 04/10/2022. ? ? ? ?12-14 Month ICM trend:  ? ? ? ?[name], RN ?04/12/2022 ?8:42 AM ? ?

## 2022-04-18 NOTE — Progress Notes (Signed)
Remote pacemaker transmission.   

## 2022-05-04 ENCOUNTER — Encounter: Payer: Medicare Other | Admitting: Physician Assistant

## 2022-05-18 DIAGNOSIS — R339 Retention of urine, unspecified: Secondary | ICD-10-CM | POA: Diagnosis not present

## 2022-05-21 ENCOUNTER — Other Ambulatory Visit: Payer: Self-pay | Admitting: Cardiovascular Disease

## 2022-05-21 DIAGNOSIS — Z79899 Other long term (current) drug therapy: Secondary | ICD-10-CM

## 2022-05-22 ENCOUNTER — Ambulatory Visit (INDEPENDENT_AMBULATORY_CARE_PROVIDER_SITE_OTHER): Payer: Medicare Other

## 2022-05-22 DIAGNOSIS — I5042 Chronic combined systolic (congestive) and diastolic (congestive) heart failure: Secondary | ICD-10-CM | POA: Diagnosis not present

## 2022-05-22 DIAGNOSIS — Z950 Presence of cardiac pacemaker: Secondary | ICD-10-CM

## 2022-05-22 NOTE — Telephone Encounter (Signed)
Prescription refill request for Eliquis received. Indication: Atrial Fib Last office visit: 03/14/22  Johnny Bridge MD Scr: 2.3 on 07/08/21 Age: 76 Weight: 107.2kg  Based on above findings Eliquis '5mg'$  twice daily is the appropriate dose.  Refill approved.  Pt is due for CBC/BMP.  Message sent to [place] to schedule labs.

## 2022-05-22 NOTE — Telephone Encounter (Signed)
Refill request

## 2022-05-22 NOTE — Telephone Encounter (Signed)
Orders placed for labs.   Called and spoke with patient's wife Jesus Hendrix, per DPR.   Advised pt's wife that he needs updated labs and that orders have been placed for labs at the [facility]. Advised that patient needs to have these completed by the end of next month.   [name] verbalized understanding and stated that she would make sure that he got them done. [name] voiced appreciation for the call.

## 2022-05-25 DIAGNOSIS — Z6838 Body mass index (BMI) 38.0-38.9, adult: Secondary | ICD-10-CM | POA: Diagnosis not present

## 2022-05-25 DIAGNOSIS — G910 Communicating hydrocephalus: Secondary | ICD-10-CM | POA: Diagnosis not present

## 2022-05-25 NOTE — Progress Notes (Signed)
EPIC Encounter for ICM Monitoring  Patient Name: Jesus Hendrix is a 76 y.o. male Date: 05/25/2022 Primary Care Physican: Maryland Pink, MD Primary Cardiologist: Rockey Situ Electrophysiologist: Allred 03/14/2022 Office Weight: 236 lbs         Transmission reviewed.    CorVue Thoracic impedance suggesting normal fluid levels.    Prescribed: Torsemide 20 mg take 1 tablet twice a day.   Labs: 07/08/2021 Creatinine 2.32, BUN 35, Potassium 4.1, Sodium 137, GFR 28 01/11/2021 Creatinine 2.11, BUN 39, Potassium 4.4, Sodium 140, GFR 30-34 A complete set of results can be found in Results Review.   Recommendations:  No changes   Follow-up plan: ICM clinic phone appointment on 06/26/2022.     91 day device clinic remote transmission 06/30/2022.       EP/Cardiology Office Visits:  Recall 03/09/2023 with Dr Rockey Situ.  05/29/2022 with Tommye Standard, PA.   Copy of ICM check sent to Dr. Rayann Heman.  3 month ICM trend: 05/22/2022.    12-14 Month ICM trend:     [name], RN 05/25/2022 1:32 PM

## 2022-05-28 NOTE — Progress Notes (Unsigned)
Cardiology Office Note Date:  05/29/2022  Patient ID:  Jesus, Hendrix 07/02/1946, MRN [id number] PCP:  Maryland Pink, MD  Cardiologist:  Dr. Rockey Situ Electrophysiologist: Dr. Rayann Heman  Chief Complaint:  over due  History of Present Illness: Jesus Hendrix is a 76 y.o. 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  He comes in today to be seen for Dr Rayann Heman, last seen by him via tele health in Aug 2020. He was off a/c via his [facility] team 2/2 hemoptysis, and on high dose steroid pulse for his autoimmune disease. His AFib described as permanent  He has been followed by Dr. Rockey Situ and his team, most recently in March 2023.  LVEF by last echo 30-35%, mo AS. W/some c/o SOB planned to update his echo. No CP Tolerating eliquis  No echo yet  TODAY He is hard of hearing, is accompanied by his wife He reports that he feels fine! He denies any cardiac awareness, no CP, palpitations. He has no rest SOB, denies symptoms of PND/orthopnea. He also denies DOE, though his wife disagrees with this. She reports that he gets winded with walking longer or faster then his usual stuff, reports him as very sedentary, sits much of his days. The patient disagrees with her, he does not perceive any SOB and reports that he is busy through the day. No reports of any dizzy spells, no near syncope or syncope.  No bleeding or signs of bleeding No longer or has not seen the [facility] team in quite a long time.  Following with neurology after shunt placed last summer for hydrocephalus.   Device information Abbott CRT-P implanted 04/05/2018   Past Medical History:  Diagnosis Date   Anxiety    Aortic stenosis    a. mild on echo 04/2014   AV block, Mobitz 2    a. 03/2018 s/p SJM Jennette Banker  MP RF model CD [id number] BiV PPM (ser #: [id number]).   Choledocholithiasis    Chronic systolic CHF (congestive heart failure) ([place])    a. echo 04/2014: EF 45-50%, nl LV dia fxn, mild AS, LA mildly dilated, PASP nl; b. 07/2019 Echo: EF 35-40%, diff HK, Nl RV fxn, RVSP 53.15mHg. Sev dil LA. Mod dil RA. Mild to mod MR. Mod TR. Mod AS. Ao root 37cm.   CKD (chronic kidney disease), stage IV (HCC)    COPD (chronic obstructive pulmonary disease) (HCC)    Not on home oxygen   Coronary artery disease    a. status post CABG in 2007; b. nuc stress test 2014: small region of mild ischemia in the inferior territory, EF 61%, no ischemia, scan similar to prior scan in 2009; c. 07/2016 MV: No ischemia.   Exogenous obesity    GERD (gastroesophageal reflux disease)    Gout    Hiatal hernia    Hip pain, chronic 08/03/2016   Waiting to have hip replacement pending NM stress test results per patient.   History of colonic diverticulitis    History of kidney stones    History of renal cell carcinoma    a. s/p laser ablation    Hyperlipidemia    Hypertension    Memory loss due to medical condition    possibly due to fluid on his brain   Moderate aortic stenosis    a. 07/2019  Echo: Mod AS.   Near syncope    a. 3 separate events in 2017   Nephrolithiasis    Panic attacks    Peripheral vascular disease ([place])    Permanent atrial fibrillation ([place]) 06/06/2016   a. Dx 06/06/2016; b. CHADS2VASc => 5 (CHF, HTN, age x 1, DM, vascular disease)-->was on Eliquis but d/c'd 2/2 anemia and hemoptysis.   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[place])     Past Surgical History:  Procedure Laterality Date   CARDIAC CATHETERIZATION     CHOLECYSTECTOMY     COLONOSCOPY N/A 06/03/2015   Procedure: COLONOSCOPY;  Surgeon: Lucilla Lame, MD;  Location: [place];  Service: Gastroenterology;  Laterality: N/A;   CORONARY ARTERY BYPASS GRAFT  2004   CABG x 3   EYE SURGERY     HERNIA REPAIR     x 2   HIP SURGERY     right hip replacement   INTRAOCULAR LENS INSERTION     KIDNEY SURGERY     cancer   NOSE SURGERY     PACEMAKER IMPLANT N/A 04/05/2018   SJM Biventricular pacemaker implanted by Dr Rayann Heman for second degree AV block, EF < 50% and permanent afib   POLYPECTOMY  06/03/2015   Procedure: POLYPECTOMY;  Surgeon: Lucilla Lame, MD;  Location: [place];  Service: Gastroenterology;;   PROSTATE SURGERY     TOTAL HIP ARTHROPLASTY Left 12/19/2016   Procedure: LEFT TOTAL HIP ARTHROPLASTY;  Surgeon: Garald Balding, MD;  Location: [place];  Service: Orthopedics;  Laterality: Left;   VENTRICULOPERITONEAL SHUNT Right 07/08/2021   Procedure: Shunt Placment - right;  Surgeon: Earnie Larsson, MD;  Location: [place];  Service: Neurosurgery;  Laterality: Right;    Current Outpatient Medications  Medication Sig Dispense Refill   acetaminophen (TYLENOL) 325 MG tablet Take 325-650 mg by mouth every 6 (six) hours as needed for pain.     apixaban (ELIQUIS) 5 MG TABS tablet TAKE 1 TABLET(5 MG) BY MOUTH TWICE DAILY 180 tablet 0   clonazePAM (KLONOPIN) 1 MG tablet Take 1 mg by mouth at bedtime.     donepezil (ARICEPT) 5 MG tablet Take 5 mg by mouth at bedtime.     famotidine (PEPCID) 20 MG tablet TAKE 1 TABLET(20 MG) BY MOUTH EVERY DAY AT BEDTIME AS NEEDED FOR HEART BURN 90 tablet 0   fluticasone (FLONASE) 50 MCG/ACT nasal spray Place 1-2 sprays into both nostrils daily as needed for allergies.     HYDROcodone-acetaminophen (NORCO/VICODIN) 5-325 MG tablet Take 1 tablet by mouth every 4 (four) hours as needed for moderate pain ((score 4 to 6)). 30 tablet 0   losartan (COZAAR) 25 MG tablet TAKE 1 TABLET(25 MG) BY MOUTH DAILY 90 tablet 2   meloxicam (MOBIC) 15 MG tablet Take 15 mg by mouth daily as needed for pain.     NON FORMULARY Take 1 capsule by mouth in the  morning and at bedtime. Pure Encapsulations Uric Acid Formula     torsemide (DEMADEX) 20 MG tablet TAKE 1 TABLET(20 MG) BY MOUTH TWICE DAILY 180 tablet 3   venlafaxine XR (EFFEXOR-XR) 37.5 MG 24 hr capsule Take 37.5 mg by mouth in the morning.     No current facility-administered medications for this visit.    Allergies:   Clonazepam   Social  History:  The patient  reports that he quit smoking about 47 years ago. His smoking use included cigarettes. He has a 12.50 pack-year smoking history. He has never used smokeless tobacco. He reports that he does not currently use alcohol after a past usage of about 2.0 standard drinks of alcohol per week. He reports that he does not use drugs.   Family History:  The patient's family history includes Heart disease in his brother, mother, and sister.  ROS:  Please see the history of present illness.    All other systems are reviewed and otherwise negative.   PHYSICAL EXAM:  VS:  BP 132/70   Pulse 72   Ht '5\' 8"'$  (1.727 m)   Wt 230 lb (104.3 kg)   SpO2 94%   BMI 34.97 kg/m  BMI: Body mass index is 34.97 kg/m. Well nourished, well developed, in no acute distress HEENT: normocephalic, atraumatic Neck: no JVD, carotid bruits or masses Cardiac:  RRR; no significant murmurs, no rubs, or gallops Lungs:  CTA b/l, no wheezing, rhonchi or rales Abd: soft, nontender, obese MS: no deformity or atrophy Ext: no edema Skin: warm and dry, no rash Neuro:  No gross deficits appreciated Psych: euthymic mood, full affect  PPM site is stable, no tethering or discomfort   EKG:  not done today  Device interrogation done today and reviewed by myself:  Programmed VVIR Battery and lead measurements are good No HVR episodes 97% BP   01/27/2021: TTE  1. Left ventricular ejection fraction, by estimation, is 30 to 35%. The  left ventricle has moderate to severely decreased function. The left  ventricle demonstrates global hypokinesis. Left ventricular diastolic   function could not be evaluated.   2. Right ventricular systolic function is moderately reduced. The right  ventricular size is normal. There is moderately elevated pulmonary artery  systolic pressure.   3. The mitral valve is grossly normal. No evidence of mitral valve  regurgitation.   4. Tricuspid valve regurgitation not assessed.   5. The aortic valve was not well visualized. Aortic valve regurgitation  is not visualized.   6. Pulmonic valve regurgitation not assessed.    10/06/2019: stress myoview Defect 1: There is a medium defect of moderate severity present in the apical inferior and apex location. This defect is fixed and likely represent a previous infarct. This is an intermediate risk study mainly due to reduced EF. Nuclear stress EF: 37%. No reversible ischemia is noted.  Recent Labs: 07/08/2021: BUN 35; Creatinine, Ser 2.32; Hemoglobin 11.6; Platelets 283; Potassium 4.1; Sodium 137  No results found for requested labs within last 365 days.   CrCl cannot be calculated (Patient's most recent lab result is older than the maximum 21 days allowed.).   Wt Readings from Last 3 Encounters:  05/29/22 230 lb (104.3 kg)  03/14/22 236 lb 4 oz (107.2 kg)  07/08/21 235 lb (106.6 kg)     Other studies reviewed: Additional studies/records reviewed today include: summarized above  ASSESSMENT AND PLAN:  CRT-P Stable measurements Rare VS beat at 40bpm today   permanent afib CHA2DS'@Vasc'$  is 5, on eliquis, appropriately dosed Rate controlled   CAD No anginal symptoms Follows with Dr. Rockey Situ  ICM Chronic CHF The patient denies any symptoms CorVue appears to chronically wobble about baseline, below currently, though no exam findings of overt volume OL  Not on a BB, unclear if there has been an intolerance in the past He has a LBBB paced morphlogy, appears unchanged for years He  was to be getting an updated echo via Dr. Rockey Situ, eval his AS and EF. Sounds like he has been  having progressive memory loss perhaps ? They would like to transition to a new EP, preferably someone in [place], will have them transition to Dr. Quentin Ore.  I will send my note to Dr. Rockey Situ, ? If we should/can consider medication optimization and/or pacing opt, +/- ? Upgrade to CRT-D pending echo     Disposition: F/u with Dr. Quentin Ore in [place], 45mo r[place]ed[place] usual  Current medicines are reviewed at length with the patient today.  The patient did not have any concerns regarding medicines.  SVenetia Night PA-C 05/29/2022 12:22 PM     [place] [phone])  [phone](fax)

## 2022-05-29 ENCOUNTER — Ambulatory Visit: Payer: Medicare Other | Admitting: Physician Assistant

## 2022-05-29 ENCOUNTER — Encounter: Payer: Self-pay | Admitting: Physician Assistant

## 2022-05-29 VITALS — BP 132/70 | HR 72 | Ht 68.0 in | Wt 230.0 lb

## 2022-05-29 DIAGNOSIS — Z950 Presence of cardiac pacemaker: Secondary | ICD-10-CM

## 2022-05-29 DIAGNOSIS — I5022 Chronic systolic (congestive) heart failure: Secondary | ICD-10-CM

## 2022-05-29 DIAGNOSIS — I255 Ischemic cardiomyopathy: Secondary | ICD-10-CM

## 2022-05-29 DIAGNOSIS — I442 Atrioventricular block, complete: Secondary | ICD-10-CM

## 2022-05-29 DIAGNOSIS — I5032 Chronic diastolic (congestive) heart failure: Secondary | ICD-10-CM

## 2022-05-29 DIAGNOSIS — I4821 Permanent atrial fibrillation: Secondary | ICD-10-CM | POA: Diagnosis not present

## 2022-05-29 LAB — CUP PACEART INCLINIC DEVICE CHECK
Battery Remaining Longevity: 57 mo
Battery Voltage: 2.98 V
Brady Statistic RA Percent Paced: 0 %
Brady Statistic RV Percent Paced: 97 %
Date Time Interrogation Session: 20230612111700
Implantable Lead Implant Date: 20190419
Implantable Lead Implant Date: 20190419
Implantable Lead Location: 753858
Implantable Lead Location: 753860
Implantable Pulse Generator Implant Date: 20190419
Lead Channel Impedance Value: 400 Ohm
Lead Channel Impedance Value: 762.5 Ohm
Lead Channel Pacing Threshold Amplitude: 0.875 V
Lead Channel Pacing Threshold Amplitude: 1.125 V
Lead Channel Pacing Threshold Pulse Width: 0.5 ms
Lead Channel Pacing Threshold Pulse Width: 0.8 ms
Lead Channel Sensing Intrinsic Amplitude: 12 mV
Lead Channel Setting Pacing Amplitude: 2 V
Lead Channel Setting Pacing Amplitude: 2.125
Lead Channel Setting Pacing Pulse Width: 0.5 ms
Lead Channel Setting Pacing Pulse Width: 0.8 ms
Lead Channel Setting Sensing Sensitivity: 4 mV
Pulse Gen Model: 3262
Pulse Gen Serial Number: 9017264

## 2022-05-29 NOTE — Patient Instructions (Signed)
Medication Instructions:   Your physician recommends that you continue on your current medications as directed. Please refer to the Current Medication list given to you today.   *If you need a refill on your cardiac medications before your next appointment, please call your pharmacy*   Lab Work: NONE ORDERED  TODAY     If you have labs (blood work) drawn today and your tests are completely normal, you will receive your results only by: MyChart Message (if you have MyChart) OR A paper copy in the mail If you have any lab test that is abnormal or we need to change your treatment, we will call you to review the results.   Testing/Procedures: NONE ORDERED  TODAY    Follow-Up: At [facility], you and your health needs are our priority.  As part of our continuing mission to provide you with exceptional heart care, we have created designated Provider Care Teams.  These Care Teams include your primary Cardiologist (physician) and Advanced Practice Providers (APPs -  Physician Assistants and Nurse Practitioners) who all work together to provide you with the care you need, when you need it.  We recommend signing up for the patient portal called "MyChart".  Sign up information is provided on this After Visit Summary.  MyChart is used to connect with patients for Virtual Visits (Telemedicine).  Patients are able to view lab/test results, encounter notes, upcoming appointments, etc.  Non-urgent messages can be sent to your provider as well.   To learn more about what you can do with MyChart, go to [email or web address].    Your next appointment:   6 month(s)  The format for your next appointment:   In Person  Provider:   Cameron Lambert, MD    Other Instructions  Important Information About Sugar       

## 2022-06-08 DIAGNOSIS — H61001 Unspecified perichondritis of right external ear: Secondary | ICD-10-CM | POA: Diagnosis not present

## 2022-06-08 DIAGNOSIS — L281 Prurigo nodularis: Secondary | ICD-10-CM | POA: Diagnosis not present

## 2022-06-08 DIAGNOSIS — D2262 Melanocytic nevi of left upper limb, including shoulder: Secondary | ICD-10-CM | POA: Diagnosis not present

## 2022-06-08 DIAGNOSIS — D225 Melanocytic nevi of trunk: Secondary | ICD-10-CM | POA: Diagnosis not present

## 2022-06-08 DIAGNOSIS — C44319 Basal cell carcinoma of skin of other parts of face: Secondary | ICD-10-CM | POA: Diagnosis not present

## 2022-06-08 DIAGNOSIS — C44519 Basal cell carcinoma of skin of other part of trunk: Secondary | ICD-10-CM | POA: Diagnosis not present

## 2022-06-08 DIAGNOSIS — Z85828 Personal history of other malignant neoplasm of skin: Secondary | ICD-10-CM | POA: Diagnosis not present

## 2022-06-08 DIAGNOSIS — D2261 Melanocytic nevi of right upper limb, including shoulder: Secondary | ICD-10-CM | POA: Diagnosis not present

## 2022-06-19 DIAGNOSIS — R339 Retention of urine, unspecified: Secondary | ICD-10-CM | POA: Diagnosis not present

## 2022-06-26 ENCOUNTER — Ambulatory Visit (INDEPENDENT_AMBULATORY_CARE_PROVIDER_SITE_OTHER): Payer: Medicare Other

## 2022-06-26 DIAGNOSIS — I5032 Chronic diastolic (congestive) heart failure: Secondary | ICD-10-CM

## 2022-06-26 DIAGNOSIS — Z950 Presence of cardiac pacemaker: Secondary | ICD-10-CM | POA: Diagnosis not present

## 2022-06-28 DIAGNOSIS — I130 Hypertensive heart and chronic kidney disease with heart failure and stage 1 through stage 4 chronic kidney disease, or unspecified chronic kidney disease: Secondary | ICD-10-CM | POA: Diagnosis not present

## 2022-06-28 DIAGNOSIS — E785 Hyperlipidemia, unspecified: Secondary | ICD-10-CM | POA: Diagnosis not present

## 2022-06-28 DIAGNOSIS — I5032 Chronic diastolic (congestive) heart failure: Secondary | ICD-10-CM | POA: Diagnosis not present

## 2022-06-28 DIAGNOSIS — C641 Malignant neoplasm of right kidney, except renal pelvis: Secondary | ICD-10-CM | POA: Diagnosis not present

## 2022-06-29 DIAGNOSIS — E785 Hyperlipidemia, unspecified: Secondary | ICD-10-CM | POA: Diagnosis not present

## 2022-06-29 DIAGNOSIS — C641 Malignant neoplasm of right kidney, except renal pelvis: Secondary | ICD-10-CM | POA: Diagnosis not present

## 2022-06-29 DIAGNOSIS — I5032 Chronic diastolic (congestive) heart failure: Secondary | ICD-10-CM | POA: Diagnosis not present

## 2022-06-29 DIAGNOSIS — I130 Hypertensive heart and chronic kidney disease with heart failure and stage 1 through stage 4 chronic kidney disease, or unspecified chronic kidney disease: Secondary | ICD-10-CM | POA: Diagnosis not present

## 2022-06-29 NOTE — Progress Notes (Signed)
EPIC Encounter for ICM Monitoring  Patient Name: Jesus Hendrix is a 76 y.o. male Date: 06/29/2022 Primary Care Physican: Maryland Pink, MD Primary Cardiologist: Rockey Situ Electrophysiologist: Allred 03/14/2022 Office Weight: 236 lbs 05/29/2022 Office Weight: 230 lbs         Transmission reviewed.    CorVue Thoracic impedance suggesting normal fluid levels.    Prescribed: Torsemide 20 mg take 1 tablet twice a day.   Labs: 06/28/2022 Creatinine 1.8, BUN 37, Potassium 5.4, Sodium 134, GFR 37  A complete set of results can be found in Results Review.   Recommendations:  No changes   Follow-up plan: ICM clinic phone appointment on 07/31/2022.     91 day device clinic remote transmission 09/29/2022.       EP/Cardiology Office Visits:  Recall 03/09/2023 with Dr Rockey Situ.  Recall 11/25/2022 with Dr Quentin Ore.   Copy of ICM check sent to Dr. Rayann Heman.   3 month ICM trend: 06/26/2022.    12-14 Month ICM trend:     [name], RN 06/29/2022 3:01 PM

## 2022-07-04 DIAGNOSIS — C44319 Basal cell carcinoma of skin of other parts of face: Principal | ICD-10-CM

## 2022-07-05 DIAGNOSIS — E875 Hyperkalemia: Secondary | ICD-10-CM | POA: Diagnosis not present

## 2022-07-05 DIAGNOSIS — R748 Abnormal levels of other serum enzymes: Secondary | ICD-10-CM | POA: Diagnosis not present

## 2022-07-05 DIAGNOSIS — C641 Malignant neoplasm of right kidney, except renal pelvis: Secondary | ICD-10-CM | POA: Diagnosis not present

## 2022-07-26 DIAGNOSIS — R339 Retention of urine, unspecified: Secondary | ICD-10-CM | POA: Diagnosis not present

## 2022-07-31 ENCOUNTER — Ambulatory Visit (INDEPENDENT_AMBULATORY_CARE_PROVIDER_SITE_OTHER): Payer: Medicare Other

## 2022-07-31 ENCOUNTER — Telehealth: Payer: Self-pay

## 2022-07-31 DIAGNOSIS — I5032 Chronic diastolic (congestive) heart failure: Secondary | ICD-10-CM

## 2022-07-31 DIAGNOSIS — Z950 Presence of cardiac pacemaker: Secondary | ICD-10-CM

## 2022-07-31 NOTE — Telephone Encounter (Signed)
Remote ICM transmission received.  Attempted call to patient regarding ICM remote transmission and left message per DPR to return call.   

## 2022-07-31 NOTE — Progress Notes (Signed)
Spoke with wife and heart failure questions reviewed.  Pt asymptomatic for fluid accumulation.  She pt is supposed to performing self catheterization at least once a day but wife is having difficulty getting him comply.  He is having some memory issues.  Reviewed diet and fluid intake.  Recommendation to limit salt intake to 2000 mg daily and fluid intake to 64 oz daily.  She will try to push him harder to get him to catheterize at least once a day to help with removing fluid build up.

## 2022-07-31 NOTE — Progress Notes (Signed)
EPIC Encounter for ICM Monitoring  Patient Name: Jesus Hendrix is a 76 y.o. male Date: 07/31/2022 Primary Care Physican: Maryland Pink, MD Primary Cardiologist: Rockey Situ Electrophysiologist: Allred 03/14/2022 Office Weight: 236 lbs 05/29/2022 Office Weight: 230 lbs         Attempted call to wife/patient and unable to reach.  Left message to return call. Transmission reviewed.    CorVue Thoracic impedance suggesting possible fluid accumulation 8/10.    Prescribed: Torsemide 20 mg take 1 tablet twice a day.   Labs: 06/28/2022 Creatinine 1.8, BUN 37, Potassium 5.4, Sodium 134, GFR 37  A complete set of results can be found in Results Review.   Recommendations:  Unable to reach.     Follow-up plan: ICM clinic phone appointment on 08/07/2022 to recheck fluid levels.     91 day device clinic remote transmission 09/29/2022.       EP/Cardiology Office Visits:  Recall 03/09/2023 with Dr Rockey Situ.  Recall 11/25/2022 with Dr Quentin Ore.   Copy of ICM check sent to Dr. Rayann Heman.   3 month ICM trend: 07/31/2022.    12-14 Month ICM trend:     [name], RN 07/31/2022 12:55 PM

## 2022-08-07 ENCOUNTER — Telehealth: Payer: Self-pay

## 2022-08-07 ENCOUNTER — Ambulatory Visit (INDEPENDENT_AMBULATORY_CARE_PROVIDER_SITE_OTHER): Payer: Medicare Other

## 2022-08-07 DIAGNOSIS — I5032 Chronic diastolic (congestive) heart failure: Secondary | ICD-10-CM

## 2022-08-07 DIAGNOSIS — Z950 Presence of cardiac pacemaker: Secondary | ICD-10-CM

## 2022-08-07 NOTE — Telephone Encounter (Signed)
Remote ICM transmission received.  Attempted call to wife/patient regarding ICM remote transmission and left message per DPR to return call.

## 2022-08-07 NOTE — Progress Notes (Unsigned)
EPIC Encounter for ICM Monitoring  Patient Name: Jesus Hendrix is a 76 y.o. male Date: 08/07/2022 Primary Care Physican: Maryland Pink, MD Primary Cardiologist: Rockey Situ Electrophysiologist: Allred 03/14/2022 Office Weight: 236 lbs 05/29/2022 Office Weight: 230 lbs         Attempted call to wife/patient and unable to reach.  Left message to return call. Transmission reviewed.    CorVue Thoracic impedance suggesting ongoing possible fluid accumulation 8/10.    Prescribed: Torsemide 20 mg take 1 tablet twice a day.   Labs: 06/28/2022 Creatinine 1.8, BUN 37, Potassium 5.4, Sodium 134, GFR 37  A complete set of results can be found in Results Review.   Recommendations:  Unable to reach.     Follow-up plan: ICM clinic phone appointment on 08/14/2022 to recheck fluid levels.     91 day device clinic remote transmission 09/29/2022.       EP/Cardiology Office Visits:  Recall 03/09/2023 with Dr Rockey Situ.  Recall 11/25/2022 with Dr Quentin Ore.   Copy of ICM check sent to Dr. Rayann Heman.  Will send to Dr Rockey Situ for review if patient/wife reached.  3 month ICM trend: 08/07/2022.    12-14 Month ICM trend:     [name], RN 08/07/2022 2:09 PM

## 2022-08-14 ENCOUNTER — Ambulatory Visit (INDEPENDENT_AMBULATORY_CARE_PROVIDER_SITE_OTHER): Payer: Medicare Other

## 2022-08-14 ENCOUNTER — Telehealth: Payer: Self-pay

## 2022-08-14 DIAGNOSIS — I5032 Chronic diastolic (congestive) heart failure: Secondary | ICD-10-CM

## 2022-08-14 DIAGNOSIS — Z950 Presence of cardiac pacemaker: Secondary | ICD-10-CM

## 2022-08-14 NOTE — Telephone Encounter (Signed)
Remote ICM transmission received.  Attempted call to wife/patient regarding ICM remote transmission and no answer.

## 2022-08-14 NOTE — Progress Notes (Signed)
EPIC Encounter for ICM Monitoring  Patient Name: Jesus Hendrix is a 76 y.o. male Date: 08/14/2022 Primary Care Physican: Maryland Pink, MD Primary Cardiologist: Rockey Situ Electrophysiologist: Quentin Ore 03/14/2022 Office Weight: 236 lbs 05/29/2022 Office Weight: 230 lbs         Attempted call to wife/patient and unable to reach.  Transmission reviewed.    CorVue Thoracic impedance suggesting fluid levels returned to normal on 8/21 then begin trending below baseline normal again suggesting possible fluid accumulation returned 8/27.   Prescribed: Torsemide 20 mg take 1 tablet twice a day.   Labs: 06/28/2022 Creatinine 1.8, BUN 37, Potassium 5.4, Sodium 134, GFR 37  A complete set of results can be found in Results Review.   Recommendations:  Unable to reach.     Follow-up plan: ICM clinic phone appointment on 08/22/2022 to recheck fluid levels.     91 day device clinic remote transmission 09/29/2022.       EP/Cardiology Office Visits:  Recall 03/09/2023 with Dr Rockey Situ.  Recall 11/25/2022 with Dr Quentin Ore.   Copy of ICM check sent to Dr. Quentin Ore.  Will send to Dr Rockey Situ for review if patient/wife reached.  3 month ICM trend: 08/14/2022.    12-14 Month ICM trend:     [name], RN 08/14/2022 1:00 PM

## 2022-08-22 ENCOUNTER — Ambulatory Visit (INDEPENDENT_AMBULATORY_CARE_PROVIDER_SITE_OTHER): Payer: Medicare Other

## 2022-08-22 DIAGNOSIS — Z950 Presence of cardiac pacemaker: Secondary | ICD-10-CM

## 2022-08-22 DIAGNOSIS — I5032 Chronic diastolic (congestive) heart failure: Secondary | ICD-10-CM

## 2022-08-23 DIAGNOSIS — R339 Retention of urine, unspecified: Secondary | ICD-10-CM | POA: Diagnosis not present

## 2022-08-23 NOTE — Progress Notes (Signed)
EPIC Encounter for ICM Monitoring  Patient Name: Jesus Hendrix is a 76 y.o. male Date: 08/23/2022 Primary Care Physican: Maryland Pink, MD Primary Cardiologist: Rockey Situ Electrophysiologist: Quentin Ore 03/14/2022 Office Weight: 236 lbs 05/29/2022 Office Weight: 230 lbs          Transmission reviewed.    CorVue Thoracic impedance suggesting fluid levels returned to normal.   Prescribed: Torsemide 20 mg take 1 tablet twice a day.   Labs: 06/28/2022 Creatinine 1.8, BUN 37, Potassium 5.4, Sodium 134, GFR 37  A complete set of results can be found in Results Review.   Recommendations:  No changes.      Follow-up plan: ICM clinic phone appointment on 09/11/2022.     91 day device clinic remote transmission 09/29/2022.       EP/Cardiology Office Visits:  Recall 03/09/2023 with Dr Rockey Situ.  Recall 11/25/2022 with Dr Quentin Ore.   Copy of ICM check sent to Dr. Quentin Ore.   3 month ICM trend: 08/22/2022.    12-14 Month ICM trend:     [name], RN 08/23/2022 2:15 PM

## 2022-08-28 DIAGNOSIS — D235 Other benign neoplasm of skin of trunk: Secondary | ICD-10-CM | POA: Diagnosis not present

## 2022-08-28 DIAGNOSIS — C44519 Basal cell carcinoma of skin of other part of trunk: Secondary | ICD-10-CM | POA: Diagnosis not present

## 2022-08-29 ENCOUNTER — Ambulatory Visit (INDEPENDENT_AMBULATORY_CARE_PROVIDER_SITE_OTHER): Payer: Medicare Other

## 2022-08-29 DIAGNOSIS — I255 Ischemic cardiomyopathy: Secondary | ICD-10-CM | POA: Diagnosis not present

## 2022-09-01 LAB — CUP PACEART REMOTE DEVICE CHECK
Battery Remaining Longevity: 57 mo
Battery Remaining Percentage: 57 %
Battery Voltage: 2.98 V
Date Time Interrogation Session: 20230912190920
Implantable Lead Implant Date: 20190419
Implantable Lead Implant Date: 20190419
Implantable Lead Location: 753858
Implantable Lead Location: 753860
Implantable Pulse Generator Implant Date: 20190419
Lead Channel Impedance Value: 400 Ohm
Lead Channel Impedance Value: 730 Ohm
Lead Channel Pacing Threshold Amplitude: 0.875 V
Lead Channel Pacing Threshold Amplitude: 1.125 V
Lead Channel Pacing Threshold Pulse Width: 0.5 ms
Lead Channel Pacing Threshold Pulse Width: 0.8 ms
Lead Channel Sensing Intrinsic Amplitude: 12 mV
Lead Channel Setting Pacing Amplitude: 2 V
Lead Channel Setting Pacing Amplitude: 2.125
Lead Channel Setting Pacing Pulse Width: 0.5 ms
Lead Channel Setting Pacing Pulse Width: 0.8 ms
Lead Channel Setting Sensing Sensitivity: 4 mV
Pulse Gen Model: 3262
Pulse Gen Serial Number: 9017264

## 2022-09-12 ENCOUNTER — Ambulatory Visit (INDEPENDENT_AMBULATORY_CARE_PROVIDER_SITE_OTHER): Payer: Medicare Other

## 2022-09-12 DIAGNOSIS — I5032 Chronic diastolic (congestive) heart failure: Secondary | ICD-10-CM

## 2022-09-12 DIAGNOSIS — Z950 Presence of cardiac pacemaker: Secondary | ICD-10-CM | POA: Diagnosis not present

## 2022-09-14 NOTE — Progress Notes (Signed)
EPIC Encounter for ICM Monitoring  Patient Name: Jesus Hendrix is a 76 y.o. male Date: 09/14/2022 Primary Care Physican: Maryland Pink, MD Primary Cardiologist: Rockey Situ Electrophysiologist: Quentin Ore 03/14/2022 Office Weight: 236 lbs 05/29/2022 Office Weight: 230 lbs          Transmission reviewed.    CorVue Thoracic impedance suggesting normal fluid levels since 08/25/2022.   Prescribed: Torsemide 20 mg take 1 tablet twice a day.   Labs: 06/28/2022 Creatinine 1.8, BUN 37, Potassium 5.4, Sodium 134, GFR 37  A complete set of results can be found in Results Review.   Recommendations:  No changes.      Follow-up plan: ICM clinic phone appointment on 10/16/2022.     91 day device clinic remote transmission 09/29/2022.       EP/Cardiology Office Visits:  Recall 03/09/2023 with Dr Rockey Situ.  Recall 11/25/2022 with Dr Quentin Ore.   Copy of ICM check sent to Dr. Quentin Ore.   3 month ICM trend: 09/12/2022.    12-14 Month ICM trend:     [name], RN 09/14/2022 4:32 PM

## 2022-09-14 NOTE — Progress Notes (Signed)
Remote pacemaker transmission.   

## 2022-09-22 DIAGNOSIS — R339 Retention of urine, unspecified: Secondary | ICD-10-CM | POA: Diagnosis not present

## 2022-10-16 NOTE — Progress Notes (Signed)
No ICM remote transmission received for 10/16/2022 and next ICM transmission scheduled for 11/06/2022.

## 2022-10-23 DIAGNOSIS — R339 Retention of urine, unspecified: Secondary | ICD-10-CM | POA: Diagnosis not present

## 2022-11-06 ENCOUNTER — Ambulatory Visit (INDEPENDENT_AMBULATORY_CARE_PROVIDER_SITE_OTHER): Payer: Medicare Other

## 2022-11-06 DIAGNOSIS — I5032 Chronic diastolic (congestive) heart failure: Secondary | ICD-10-CM | POA: Diagnosis not present

## 2022-11-06 DIAGNOSIS — Z950 Presence of cardiac pacemaker: Secondary | ICD-10-CM

## 2022-11-07 ENCOUNTER — Telehealth: Payer: Self-pay

## 2022-11-07 NOTE — Progress Notes (Signed)
EPIC Encounter for ICM Monitoring  Patient Name: Jesus Hendrix is a 76 y.o. male Date: 11/07/2022 Primary Care Physican: Maryland Pink, MD Primary Cardiologist: Rockey Situ Electrophysiologist: Quentin Ore 03/14/2022 Office Weight: 236 lbs 05/29/2022 Office Weight: 230 lbs          Attempted call to patient and unable to reach.  Left message to return call. Transmission reviewed.    CorVue Thoracic impedance suggesting possible fluid accumulation starting 11/12.   Prescribed: Torsemide 20 mg take 1 tablet twice a day.   Labs: 07/05/2022 Creatinine 1.9, BUN 41, Potassium 5.0, Sodium 134, GFR 35 06/28/2022 Creatinine 1.8, BUN 37, Potassium 5.4, Sodium 134, GFR 37  A complete set of results can be found in Results Review.   Recommendations:  Unable to reach.     Follow-up plan: ICM clinic phone appointment on 11/13/2022 to recheck fluid levels.     91 day device clinic remote transmission 11/28/2022.       EP/Cardiology Office Visits:  Recall 03/09/2023 with Dr Rockey Situ.  Recall 11/25/2022 with Dr Quentin Ore.   Copy of ICM check sent to Dr. Quentin Ore.  Will send to Dr Rockey Situ for review if patient is reached.   3 month ICM trend: 11/06/2022.    12-14 Month ICM trend:     [name], RN 11/07/2022 8:18 AM

## 2022-11-07 NOTE — Telephone Encounter (Signed)
Remote ICM transmission received.  Attempted call to patient regarding ICM remote transmission and left message per DPR to return call.   

## 2022-11-12 DIAGNOSIS — N390 Urinary tract infection, site not specified: Secondary | ICD-10-CM | POA: Diagnosis not present

## 2022-11-12 DIAGNOSIS — R300 Dysuria: Secondary | ICD-10-CM | POA: Diagnosis not present

## 2022-11-13 ENCOUNTER — Ambulatory Visit (INDEPENDENT_AMBULATORY_CARE_PROVIDER_SITE_OTHER): Payer: Medicare Other

## 2022-11-13 DIAGNOSIS — Z950 Presence of cardiac pacemaker: Secondary | ICD-10-CM

## 2022-11-13 DIAGNOSIS — I5032 Chronic diastolic (congestive) heart failure: Secondary | ICD-10-CM

## 2022-11-17 NOTE — Progress Notes (Signed)
EPIC Encounter for ICM Monitoring  Patient Name: Jesus Hendrix is a 76 y.o. male Date: 11/17/2022 Primary Care Physican: Maryland Pink, MD Primary Cardiologist: Rockey Situ Electrophysiologist: Quentin Ore 03/14/2022 Office Weight: 236 lbs 05/29/2022 Office Weight: 230 lbs          Transmission reviewed.    CorVue Thoracic impedance suggesting possible fluid accumulation starting 11/12 but returned close to baseline.   Prescribed: Torsemide 20 mg take 1 tablet twice a day.   Labs: 07/05/2022 Creatinine 1.9, BUN 41, Potassium 5.0, Sodium 134, GFR 35 06/28/2022 Creatinine 1.8, BUN 37, Potassium 5.4, Sodium 134, GFR 37  A complete set of results can be found in Results Review.   Recommendations:  No changes   Follow-up plan: ICM clinic phone appointment on 11/27/2022 to recheck fluid levels.     91 day device clinic remote transmission 11/28/2022.       EP/Cardiology Office Visits:  Recall 03/09/2023 with Dr Rockey Situ.  Recall 11/25/2022 with Dr Quentin Ore.   Copy of ICM check sent to Dr. Quentin Ore.   3 month ICM trend: 11/13/2022.    12-14 Month ICM trend:     [name], RN 11/17/2022 12:39 PM

## 2022-11-22 DIAGNOSIS — R339 Retention of urine, unspecified: Secondary | ICD-10-CM | POA: Diagnosis not present

## 2022-11-27 ENCOUNTER — Ambulatory Visit (INDEPENDENT_AMBULATORY_CARE_PROVIDER_SITE_OTHER): Payer: Medicare Other

## 2022-11-27 DIAGNOSIS — I5032 Chronic diastolic (congestive) heart failure: Secondary | ICD-10-CM

## 2022-11-27 DIAGNOSIS — Z950 Presence of cardiac pacemaker: Secondary | ICD-10-CM

## 2022-11-28 NOTE — Progress Notes (Signed)
EPIC Encounter for ICM Monitoring  Patient Name: Jesus Hendrix is a 76 y.o. male Date: 11/28/2022 Primary Care Physican: Maryland Pink, MD Primary Cardiologist: Rockey Situ Electrophysiologist: Quentin Ore 03/14/2022 Office Weight: 236 lbs 05/29/2022 Office Weight: 230 lbs          Transmission reviewed.    CorVue Thoracic impedance suggesting possible fluid accumulation starting 11/12 but returned to baseline 11/30.   Prescribed: Torsemide 20 mg take 1 tablet twice a day.   Labs: 07/05/2022 Creatinine 1.9, BUN 41, Potassium 5.0, Sodium 134, GFR 35 Care Everywhere 06/28/2022 Creatinine 1.8, BUN 37, Potassium 5.4, Sodium 134, GFR 37  A complete set of results can be found in Results Review.   Recommendations:  No changes   Follow-up plan: ICM clinic phone appointment on 01/08/2023.     91 day device clinic remote transmission 11/28/2022.       EP/Cardiology Office Visits:  Recall 03/09/2023 with Dr Rockey Situ.  Recall 11/25/2022 with Dr Quentin Ore.   Copy of ICM check sent to Dr. Quentin Ore.   3 month ICM trend: 11/27/2022.    12-14 Month ICM trend:     [name], RN 11/28/2022 2:08 PM

## 2022-11-29 DIAGNOSIS — I2510 Atherosclerotic heart disease of native coronary artery without angina pectoris: Secondary | ICD-10-CM | POA: Diagnosis not present

## 2022-11-29 DIAGNOSIS — I130 Hypertensive heart and chronic kidney disease with heart failure and stage 1 through stage 4 chronic kidney disease, or unspecified chronic kidney disease: Secondary | ICD-10-CM | POA: Diagnosis not present

## 2022-11-29 DIAGNOSIS — I5032 Chronic diastolic (congestive) heart failure: Secondary | ICD-10-CM | POA: Diagnosis not present

## 2022-11-29 DIAGNOSIS — Z0000 Encounter for general adult medical examination without abnormal findings: Secondary | ICD-10-CM | POA: Diagnosis not present

## 2022-12-01 DIAGNOSIS — F419 Anxiety disorder, unspecified: Secondary | ICD-10-CM | POA: Diagnosis not present

## 2022-12-01 DIAGNOSIS — E538 Deficiency of other specified B group vitamins: Secondary | ICD-10-CM | POA: Diagnosis not present

## 2022-12-01 DIAGNOSIS — I10 Essential (primary) hypertension: Secondary | ICD-10-CM | POA: Diagnosis not present

## 2022-12-01 DIAGNOSIS — E785 Hyperlipidemia, unspecified: Secondary | ICD-10-CM | POA: Diagnosis not present

## 2022-12-01 DIAGNOSIS — H6123 Impacted cerumen, bilateral: Secondary | ICD-10-CM | POA: Diagnosis not present

## 2022-12-25 DIAGNOSIS — R339 Retention of urine, unspecified: Secondary | ICD-10-CM | POA: Diagnosis not present

## 2023-01-08 ENCOUNTER — Ambulatory Visit: Payer: Medicare Other | Attending: Cardiology

## 2023-01-08 DIAGNOSIS — I5032 Chronic diastolic (congestive) heart failure: Secondary | ICD-10-CM

## 2023-01-08 DIAGNOSIS — Z950 Presence of cardiac pacemaker: Secondary | ICD-10-CM

## 2023-01-10 ENCOUNTER — Telehealth: Payer: Self-pay

## 2023-01-10 NOTE — Telephone Encounter (Signed)
Remote ICM transmission received.  Attempted call to patient regarding ICM remote transmission and left detailed message per DPR.  Advised to return call for any fluid symptoms or questions. Next ICM remote transmission scheduled 02/12/2023.    

## 2023-01-10 NOTE — Progress Notes (Signed)
EPIC Encounter for ICM Monitoring  Patient Name: Jesus Hendrix is a 77 y.o. male Date: 01/10/2023 Primary Care Physican: Maryland Pink, MD Primary Cardiologist: Rockey Situ Electrophysiologist: Quentin Ore 03/14/2022 Office Weight: 236 lbs 05/29/2022 Office Weight: 230 lbs          Attempted call to wife/patient and unable to reach.  Left detailed message per DPR regarding transmission. Transmission reviewed.    CorVue Thoracic impedance normal but was suggesting possible fluid accumulation from 12/25-1/17.   Prescribed: Torsemide 20 mg take 1 tablet twice a day.   Labs: 07/05/2022 Creatinine 1.9, BUN 41, Potassium 5.0, Sodium 134, GFR 35 Care Everywhere 06/28/2022 Creatinine 1.8, BUN 37, Potassium 5.4, Sodium 134, GFR 37  A complete set of results can be found in Results Review.   Recommendations:  Left voice mail with ICM number and encouraged to call if experiencing any fluid symptoms.   Follow-up plan: ICM clinic phone appointment on 02/12/2023.     91 day device clinic remote transmission 02/27/2023.       EP/Cardiology Office Visits:  Recall 03/09/2023 with Dr Rockey Situ.  Recall 11/25/2022 with Dr Quentin Ore.   Copy of ICM check sent to Dr. Quentin Ore.   3 month ICM trend: 01/08/2023.    12-14 Month ICM trend:     [name], RN 01/10/2023 12:08 PM

## 2023-01-24 DIAGNOSIS — R339 Retention of urine, unspecified: Secondary | ICD-10-CM | POA: Diagnosis not present

## 2023-01-26 ENCOUNTER — Other Ambulatory Visit: Payer: Self-pay | Admitting: Cardiovascular Disease

## 2023-01-26 DIAGNOSIS — I4891 Unspecified atrial fibrillation: Secondary | ICD-10-CM

## 2023-01-26 NOTE — Telephone Encounter (Signed)
Prescription refill request for Eliquis received. Indication: a fib Last office visit: 05/29/22 Scr: 1.9 care everywhere Age: 77 Weight: 104kg

## 2023-01-26 NOTE — Telephone Encounter (Signed)
Refill request

## 2023-01-27 ENCOUNTER — Other Ambulatory Visit: Payer: Self-pay | Admitting: Cardiovascular Disease

## 2023-02-12 ENCOUNTER — Ambulatory Visit: Payer: Medicare Other | Attending: Cardiology

## 2023-02-12 DIAGNOSIS — Z950 Presence of cardiac pacemaker: Secondary | ICD-10-CM | POA: Diagnosis not present

## 2023-02-12 DIAGNOSIS — I5032 Chronic diastolic (congestive) heart failure: Secondary | ICD-10-CM | POA: Diagnosis not present

## 2023-02-16 NOTE — Progress Notes (Signed)
EPIC Encounter for ICM Monitoring  Patient Name: Jesus Hendrix is a 77 y.o. male Date: 02/16/2023 Primary Care Physican: Maryland Pink, MD Primary Cardiologist: Rockey Situ Electrophysiologist: Quentin Ore 03/14/2022 Office Weight: 236 lbs 05/29/2022 Office Weight: 230 lbs         Transmission reviewed.    CorVue Thoracic impedance normal but was suggesting possible fluid accumulation from 2/5-2/11.   Prescribed: Torsemide 20 mg take 1 tablet twice a day.   Labs: 07/05/2022 Creatinine 1.9, BUN 41, Potassium 5.0, Sodium 134, GFR 35 Care Everywhere 06/28/2022 Creatinine 1.8, BUN 37, Potassium 5.4, Sodium 134, GFR 37  A complete set of results can be found in Results Review.   Recommendations:  No changes.    Follow-up plan: ICM clinic phone appointment on 03/19/2023.     91 day device clinic remote transmission 02/27/2023.       EP/Cardiology Office Visits:  Recall 03/09/2023 with Dr Rockey Situ.  Recall 11/25/2022 with Dr Quentin Ore.   Copy of ICM check sent to Dr. Quentin Ore.   3 month ICM trend: 02/12/2023.    12-14 Month ICM trend:     [name], RN 02/16/2023 2:31 PM

## 2023-02-23 DIAGNOSIS — R339 Retention of urine, unspecified: Secondary | ICD-10-CM | POA: Diagnosis not present

## 2023-02-27 ENCOUNTER — Ambulatory Visit (INDEPENDENT_AMBULATORY_CARE_PROVIDER_SITE_OTHER): Payer: Medicare Other

## 2023-02-27 DIAGNOSIS — I255 Ischemic cardiomyopathy: Secondary | ICD-10-CM

## 2023-03-01 DIAGNOSIS — R600 Localized edema: Secondary | ICD-10-CM | POA: Diagnosis not present

## 2023-03-01 DIAGNOSIS — I5032 Chronic diastolic (congestive) heart failure: Secondary | ICD-10-CM | POA: Diagnosis not present

## 2023-03-01 DIAGNOSIS — R5383 Other fatigue: Secondary | ICD-10-CM | POA: Diagnosis not present

## 2023-03-01 DIAGNOSIS — R140 Abdominal distension (gaseous): Secondary | ICD-10-CM | POA: Diagnosis not present

## 2023-03-01 DIAGNOSIS — N184 Chronic kidney disease, stage 4 (severe): Secondary | ICD-10-CM | POA: Diagnosis not present

## 2023-03-01 DIAGNOSIS — I130 Hypertensive heart and chronic kidney disease with heart failure and stage 1 through stage 4 chronic kidney disease, or unspecified chronic kidney disease: Secondary | ICD-10-CM | POA: Diagnosis not present

## 2023-03-01 DIAGNOSIS — R110 Nausea: Secondary | ICD-10-CM | POA: Diagnosis not present

## 2023-03-01 DIAGNOSIS — R5381 Other malaise: Secondary | ICD-10-CM | POA: Diagnosis not present

## 2023-03-01 LAB — CUP PACEART REMOTE DEVICE CHECK
Battery Remaining Longevity: 51 mo
Battery Remaining Percentage: 52 %
Battery Voltage: 2.96 V
Date Time Interrogation Session: 20240313015020
Implantable Lead Connection Status: 753985
Implantable Lead Connection Status: 753985
Implantable Lead Implant Date: 20190419
Implantable Lead Implant Date: 20190419
Implantable Lead Location: 753858
Implantable Lead Location: 753860
Implantable Pulse Generator Implant Date: 20190419
Lead Channel Impedance Value: 410 Ohm
Lead Channel Impedance Value: 650 Ohm
Lead Channel Pacing Threshold Amplitude: 0.75 V
Lead Channel Pacing Threshold Amplitude: 1 V
Lead Channel Pacing Threshold Pulse Width: 0.5 ms
Lead Channel Pacing Threshold Pulse Width: 0.8 ms
Lead Channel Sensing Intrinsic Amplitude: 7.2 mV
Lead Channel Setting Pacing Amplitude: 2 V
Lead Channel Setting Pacing Amplitude: 2 V
Lead Channel Setting Pacing Pulse Width: 0.5 ms
Lead Channel Setting Pacing Pulse Width: 0.8 ms
Lead Channel Setting Sensing Sensitivity: 4 mV
Pulse Gen Model: 3262
Pulse Gen Serial Number: 9017264

## 2023-03-02 DIAGNOSIS — I130 Hypertensive heart and chronic kidney disease with heart failure and stage 1 through stage 4 chronic kidney disease, or unspecified chronic kidney disease: Secondary | ICD-10-CM | POA: Diagnosis not present

## 2023-03-02 DIAGNOSIS — R140 Abdominal distension (gaseous): Secondary | ICD-10-CM | POA: Diagnosis not present

## 2023-03-04 ENCOUNTER — Other Ambulatory Visit: Payer: Self-pay | Admitting: Cardiovascular Disease

## 2023-03-05 ENCOUNTER — Telehealth: Payer: Self-pay | Admitting: Medical

## 2023-03-05 ENCOUNTER — Encounter: Payer: Self-pay | Admitting: Medical

## 2023-03-05 ENCOUNTER — Other Ambulatory Visit
Admission: RE | Admit: 2023-03-05 | Discharge: 2023-03-05 | Disposition: A | Discharge status: Home / Self Care | Payer: Medicare Other | Source: Ambulatory Visit | Attending: Medical | Admitting: Medical

## 2023-03-05 ENCOUNTER — Ambulatory Visit: Payer: Medicare Other | Attending: Medical | Admitting: Medical

## 2023-03-05 VITALS — BP 139/69 | HR 60 | Ht 68.0 in | Wt 247.8 lb

## 2023-03-05 DIAGNOSIS — I350 Nonrheumatic aortic (valve) stenosis: Secondary | ICD-10-CM

## 2023-03-05 DIAGNOSIS — Z950 Presence of cardiac pacemaker: Secondary | ICD-10-CM | POA: Diagnosis not present

## 2023-03-05 DIAGNOSIS — I255 Ischemic cardiomyopathy: Secondary | ICD-10-CM | POA: Diagnosis present

## 2023-03-05 DIAGNOSIS — I5023 Acute on chronic systolic (congestive) heart failure: Secondary | ICD-10-CM

## 2023-03-05 DIAGNOSIS — I5022 Chronic systolic (congestive) heart failure: Secondary | ICD-10-CM | POA: Diagnosis present

## 2023-03-05 DIAGNOSIS — R600 Localized edema: Secondary | ICD-10-CM | POA: Diagnosis not present

## 2023-03-05 DIAGNOSIS — E782 Mixed hyperlipidemia: Secondary | ICD-10-CM

## 2023-03-05 DIAGNOSIS — I4821 Permanent atrial fibrillation: Secondary | ICD-10-CM | POA: Insufficient documentation

## 2023-03-05 DIAGNOSIS — I10 Essential (primary) hypertension: Secondary | ICD-10-CM | POA: Diagnosis not present

## 2023-03-05 DIAGNOSIS — I5032 Chronic diastolic (congestive) heart failure: Secondary | ICD-10-CM | POA: Insufficient documentation

## 2023-03-05 DIAGNOSIS — I442 Atrioventricular block, complete: Secondary | ICD-10-CM

## 2023-03-05 LAB — BRAIN NATRIURETIC PEPTIDE: B Natriuretic Peptide: 339.7 pg/mL — ABNORMAL HIGH (ref 0.0–100.0)

## 2023-03-05 MED ORDER — LOSARTAN POTASSIUM 25 MG PO TABS: 25.0000 mg | ORAL_TABLET | Freq: Every day | ORAL | 0 refills | Status: DC

## 2023-03-05 NOTE — Telephone Encounter (Signed)
Pt wife called in to inform Alisha, RN that pt is taking furosemide 20mg  2x daily.

## 2023-03-05 NOTE — Progress Notes (Signed)
Cardiology Office Note:    Date:  03/05/2023   ID:  JULIANNA CUARTAS, DOB 1945/12/28, MRN [id number]  PCP:  Maryland Pink, MD  [facility] Cardiologist:  Ida Rogue, MD  [facility] Electrophysiologist:  [name], MD (Inactive)   Referring MD: Maryland Pink, MD   Chief Complaint: 1 year follow-up  History of Present Illness:    ZADARIUS MARN is a 77 y.o. male with a hx of persistent atrial fibrillation, chronic back pain, CAD status post CABG in 2007, hyperlipidemia, obesity, kidney cancer, CKD stage 3-4, status post laser ablation at [facility], ANCA associated vasculitis, hydrocephalus s/p shunt 06/2021, Wegener's granulomatosis, medication noncompliance, complete heart block status post pacemaker, HFrEF who presents for 1 year follow-up.  Patient has a history of CAD status post CABG in 2007.  Echo in January 2009 showed mild MR, normal LV function, aortic valve sclerosis without significant stenosis.  Stress test January 2009 showed large region of decreased perfusion mild to moderate severity in the inferior wall consistent with possible old MI.  Defect was worse in the inferior apical region.  No evidence of ischemia EF 55%.  Echo in August 2020 showed LVEF 35 to 40%, mild LVH, normal RV size and function, moderately elevated RV pressures, severely dilated left atrium, mild to moderate MR, moderate TR, mild AI, moderate stenosis of the aortic valve. Myoview lexiscan in 09/2019 showed a medium defect of moderate severity in the apical inferior and apex location, likely due to previous infarct, intermediate risk study, no reversible ischemia noted.   Patient was last seen March 2023 was overall doing well from a cardiac perspective.  Repeat echo was ordered.  This showed persistently low LVEF 30 to 35%, global hypokinesis, moderately reduced RV SF.  Today, the patient reports lower leg edema for 1-2 weeks. Also some swelling in the abdomen, feels tight. No chest pain. PCP note says he  increased lasix for 3 days. Patient feels intermittent shortness of breath and fatigue all the time. No dizziness, lightheadedness or palpitations. Wife is unsure of patient is taking lasix or torsemide, she will have to call and let us know. Patient eats a lot of salt. Hasn't seen nephrologist in a while.   Past Medical History:  Diagnosis Date   Anxiety    Aortic stenosis    a. mild on echo 04/2014   AV block, Mobitz 2    a. 03/2018 s/p SJM Jennette Banker MP RF model CD [id number] BiV PPM (ser #: [id number]).   Choledocholithiasis    Chronic systolic CHF (congestive heart failure) ([place])    a. echo 04/2014: EF 45-50%, nl LV dia fxn, mild AS, LA mildly dilated, PASP nl; b. 07/2019 Echo: EF 35-40%, diff HK, Nl RV fxn, RVSP 53.28mmHg. Sev dil LA. Mod dil RA. Mild to mod MR. Mod TR. Mod AS. Ao root 37cm.   CKD (chronic kidney disease), stage IV (HCC)    COPD (chronic obstructive pulmonary disease) (HCC)    Not on home oxygen   Coronary artery disease    a. status post CABG in 2007; b. nuc stress test 2014: small region of mild ischemia in the inferior territory, EF 61%, no ischemia, scan similar to prior scan in 2009; c. 07/2016 MV: No ischemia.   Exogenous obesity    GERD (gastroesophageal reflux disease)    Gout    Hiatal hernia    Hip pain, chronic 08/03/2016   Waiting to have hip replacement pending NM stress test results per patient.  History of colonic diverticulitis    History of kidney stones    History of renal cell carcinoma    a. s/p laser ablation    Hyperlipidemia    Hypertension    Memory loss due to medical condition    possibly due to fluid on his brain   Moderate aortic stenosis    a. 07/2019 Echo: Mod AS.   Near syncope    a. 3 separate events in 2017   Nephrolithiasis    Panic attacks    Peripheral vascular disease ([place])    Permanent atrial fibrillation ([place]) 06/06/2016   a. Dx 06/06/2016; b. CHADS2VASc => 5 (CHF, HTN, age x 1, DM, vascular disease)-->was on Eliquis but d/c'd  2/2 anemia and hemoptysis.   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[place])     Past Surgical History:  Procedure Laterality Date   CARDIAC CATHETERIZATION     CHOLECYSTECTOMY     COLONOSCOPY N/A 06/03/2015   Procedure: COLONOSCOPY;  Surgeon: Lucilla Lame, MD;  Location: [facility];  Service: Gastroenterology;  Laterality: N/A;   CORONARY ARTERY BYPASS GRAFT  2004   CABG x 3   EYE SURGERY     HERNIA REPAIR     x 2   HIP SURGERY     right hip replacement   INTRAOCULAR LENS INSERTION     KIDNEY SURGERY     cancer   NOSE SURGERY     PACEMAKER IMPLANT N/A 04/05/2018   SJM Biventricular pacemaker implanted by Dr Rayann Heman for second degree AV block, EF < 50% and permanent afib   POLYPECTOMY  06/03/2015   Procedure: POLYPECTOMY;  Surgeon: Lucilla Lame, MD;  Location: [place];  Service: Gastroenterology;;   PROSTATE SURGERY     TOTAL HIP ARTHROPLASTY Left 12/19/2016   Procedure: LEFT TOTAL HIP ARTHROPLASTY;  Surgeon: Garald Balding, MD;  Location: [place];  Service: Orthopedics;  Laterality: Left;   VENTRICULOPERITONEAL SHUNT Right 07/08/2021   Procedure: Shunt Placment - right;  Surgeon: Earnie Larsson, MD;  Location: [place];  Service: Neurosurgery;  Laterality: Right;    Current Medications: Current Meds  Medication Sig   acetaminophen (TYLENOL) 325 MG tablet Take 325-650 mg by mouth every 6 (six) hours as needed for pain.   clonazePAM (KLONOPIN) 1 MG tablet Take 1 mg by mouth at bedtime.   donepezil (ARICEPT) 5 MG tablet Take 5 mg by mouth at bedtime.   ELIQUIS 5 MG TABS tablet TAKE 1 TABLET(5 MG) BY MOUTH TWICE DAILY   famotidine (PEPCID) 20 MG tablet TAKE 1 TABLET(20 MG) BY MOUTH EVERY DAY AT BEDTIME AS NEEDED FOR HEART BURN   fluticasone (FLONASE) 50 MCG/ACT nasal spray Place 1-2 sprays into both  nostrils daily as needed for allergies.   meloxicam (MOBIC) 15 MG tablet Take 15 mg by mouth daily as needed for pain.   NON FORMULARY Take 1 capsule by mouth in the morning and at bedtime. Pure Encapsulations Uric Acid Formula   sertraline (ZOLOFT) 50 MG tablet Take 50 mg by mouth daily.   torsemide (DEMADEX) 20 MG tablet TAKE 1 TABLET(20 MG) BY MOUTH TWICE DAILY   [DISCONTINUED] losartan (COZAAR) 25 MG tablet Take 1 tablet (25 mg total) by mouth daily. Please schedule office visit for further refills. Thank you!     Allergies:  Clonazepam   Social History   Socioeconomic History   Marital status: Married    Spouse name: Not on file   Number of children: Not on file   Years of education: Not on file   Highest education level: Not on file  Occupational History   Not on file  Tobacco Use   Smoking status: Former    Packs/day: 0.50    Years: 25.00    Additional pack years: 0.00    Total pack years: 12.50    Types: Cigarettes    Quit date: 12/29/1974    Years since quitting: 48.2   Smokeless tobacco: Never  Vaping Use   Vaping Use: Never used  Substance and Sexual Activity   Alcohol use: Not Currently    Alcohol/week: 2.0 standard drinks of alcohol    Types: 2 Cans of beer per week    Comment: social   Drug use: No   Sexual activity: Not on file  Other Topics Concern   Not on file  Social History Narrative   Not on file   Social Determinants of Health   Financial Resource Strain: Not on file  Food Insecurity: Not on file  Transportation Needs: Not on file  Physical Activity: Not on file  Stress: Not on file  Social Connections: Not on file     Family History: The patient's family history includes Heart disease in his brother, mother, and sister.  ROS:   Please see the history of present illness.     All other systems reviewed and are negative.  EKGs/Labs/Other Studies Reviewed:    The following studies were reviewed today:  Echo 01/2021  1. Left  ventricular ejection fraction, by estimation, is 30 to 35%. The  left ventricle has moderate to severely decreased function. The left  ventricle demonstrates global hypokinesis. Left ventricular diastolic  function could not be evaluated.   2. Right ventricular systolic function is moderately reduced. The right  ventricular size is normal. There is moderately elevated pulmonary artery  systolic pressure.   3. The mitral valve is grossly normal. No evidence of mitral valve  regurgitation.   4. Tricuspid valve regurgitation not assessed.   5. The aortic valve was not well visualized. Aortic valve regurgitation  is not visualized.   6. Pulmonic valve regurgitation not assessed.   Myoview lexiscan 09/2019 Defect 1: There is a medium defect of moderate severity present in the apical inferior and apex location. This defect is fixed and likely represent a previous infarct. This is an intermediate risk study mainly due to reduced EF. Nuclear stress EF: 37%. No reversible ischemia is noted.    Echo 07/2019 1. The left ventricle has moderately reduced systolic function, with an  ejection fraction of 35-40%. The cavity size was normal. There is mildly  increased left ventricular wall thickness. Left ventricular diastolic  Doppler parameters are indeterminate.  Left ventricular diffuse hypokinesis.   2. The right ventricle has normal systolic function. The cavity was  normal. There is no increase in right ventricular wall thickness. Right  ventricular systolic pressure is moderately elevated with an estimated  pressure of 53.3 mmHg.   3. Left atrial size was severely dilated.   4. Right atrial size was moderately dilated.   5. Mitral valve regurgitation is mild to moderate   6. Tricuspid valve regurgitation is moderate.   7. The aortic valve was not well visualized. Moderate calcification of  the aortic valve. Aortic valve regurgitation is mild by color flow  Doppler. Moderate stenosis of the  aortic valve.   8. There is dilatation of the aortic root.3.7 cm   Echo 03/2018  Study Conclusions   - Left ventricle: The cavity size was normal. There was mild    concentric hypertrophy. Systolic function was mildly reduced. The    estimated ejection fraction was in the range of 45% to 50%.    Hypokinesis and scarring of the inferolateral myocardium;    consistent with infarction in the distribution of the right    coronary or left circumflex coronary artery.  - Ventricular septum: Septal motion showed paradox. These changes    are consistent with intraventricular conduction    delay/idioventricular rhythm.  - Aortic valve: There was mild to moderate stenosis. Valve area    (VTI): 1.56 cm^2. Valve area (Vmax): 1.5 cm^2. Valve area    (Vmean): 1.48 cm^2.  - Mitral valve: Calcified annulus. There was moderate    regurgitation.  - Left atrium: The atrium was severely dilated.  - Right ventricle: The cavity size was mildly dilated. Systolic    function was moderately reduced.  - Right atrium: The atrium was severely dilated.  - Tricuspid valve: There was moderate regurgitation.  - Pulmonary arteries: Systolic pressure was mildly increased. PA    peak pressure: 44 mm Hg (S).   EKG:  EKG is ordered today.  The ekg ordered today demonstrates V paced rhythm, 60bpm  Recent Labs: No results found for requested labs within last 365 days.  Recent Lipid Panel    Component Value Date/Time   CHOL 132 04/05/2018 0236   TRIG 58 04/05/2018 0236   HDL 29 (L) 04/05/2018 0236   CHOLHDL 4.6 04/05/2018 0236   VLDL 12 04/05/2018 0236   LDLCALC 91 04/05/2018 0236    Physical Exam:    VS:  BP 139/69 (BP Location: Left Arm, Patient Position: Sitting, Cuff Size: Normal)   Pulse 60   Ht 5\' 8"  (1.727 m)   Wt 247 lb 12.8 oz (112.4 kg)   SpO2 98%   BMI 37.68 kg/m     Wt Readings from Last 3 Encounters:  03/05/23 247 lb 12.8 oz (112.4 kg)  05/29/22 230 lb (104.3 kg)  03/14/22 236 lb 4 oz  (107.2 kg)     GEN:  Well nourished, well developed in no acute distress HEENT: Normal NECK: + JVD; No carotid bruits LYMPHATICS: No lymphadenopathy CARDIAC: RRR, + murmur, rubs, gallops RESPIRATORY:  Clear to auscultation without rales, wheezing or rhonchi  ABDOMEN: Soft, non-tender, non-distended MUSCULOSKELETAL:  2+ lower leg edema; No deformity  SKIN: Warm and dry NEUROLOGIC:  Alert and oriented x 3 PSYCHIATRIC:  Normal affect   ASSESSMENT:    1. Acute on chronic systolic heart failure ([place])   2. Lower leg edema   3. Biventricular cardiac pacemaker in situ   4. Complete heart block ([place])   5. Moderate aortic stenosis   6. Essential hypertension   7. Hyperlipidemia, mixed   8. Permanent atrial fibrillation (HCC)    PLAN:    In order of problems listed above:  Acute on chronic HFrEF Patient presents with 2 weeks of progressive lower leg edema and abdominal tightness.  No dyspnea on exertion or chest pain reported.  Patient saw PCP recently who increased Lasix to 40 mg for 3 days.  On our records patient is taking torsemide 20 mg twice daily.  Wife is unsure which diuretic patient is taking, she will call back and let us know. On exam,  patient is volume overloaded.  Recent labs showed creatinine 1.9, which is stable for patient.  I will check a BNP today.  Echo from 2020 showed reduced EF 35 to 40% subsequent MPI showed no reversible ischemia, intermittent rest study due to reduced EF.  Echo in 2022 showed further reduced EF 30 to 35%. I will repeat an echocardiogram. Low salt diet, leg elevation and compression socks encouraged. I also recommend patient see nephrology given CKD stage 3. I will see patient back in 2 to 3 weeks.   CAD status post remote CABG Patient denies chest pain.  Plan for repeat echo as above.  Given kidney disease, want to avoid cardiac cath, however may be necessary pending echo. Will discuss with MD. Not on BB therapy. No ASA given Eliquis.   Complete  heart block s/p PPM Followed by EP.  Most recent device interrogation showed normally functioning device.  Moderate Aortic Valve stenosis Moderate aortic valve stenosis on echo in 2020.  Repeat echo as above.  HTN Blood pressure mildly elevated at 139/69.  No changes today.  HLD Most recent labs showed LDL 131.  Patient is not on lipid therapy.  Can discuss at follow-up.  Permanent Afib Continue Eliquis 5 mg twice daily for stroke prophylaxis. CHADSVasc OF 5. Not on BB, unclear if patient had intolerance in the past. Needs to follow-up with EP.  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  Disposition: Follow up in 2-3 week(s) with MD/APP    Signed, Andrus Sharp Ninfa Meeker, PA-C  03/05/2023 1:17 PM    [facility]

## 2023-03-05 NOTE — Telephone Encounter (Signed)
Please refill if appropriate during office visit with Cadence Furth today. Thank you!

## 2023-03-05 NOTE — Patient Instructions (Signed)
Medication Instructions:   Your physician recommends that you continue on your current medications as directed. Please refer to the Current Medication list given to you today.  *If you need a refill on your cardiac medications before your next appointment, please call your pharmacy*   Lab Work:  Your physician recommends that you have lab work: TODAY at [facility].  BNP   If you have labs (blood work) drawn today and your tests are completely normal, you will receive your results only by: MyChart Message (if you have MyChart) OR A paper copy in the mail If you have any lab test that is abnormal or we need to change your treatment, we will call you to review the results.   Testing/Procedures:  Your physician has requested that you have an echocardiogram. Echocardiography is a painless test that uses sound waves to create images of your heart. It provides your doctor with information about the size and shape of your heart and how well your heart's chambers and valves are working. This procedure takes approximately one hour. There are no restrictions for this procedure. Please do NOT wear cologne, perfume, aftershave, or lotions (deodorant is allowed). Please arrive 15 minutes prior to your appointment time.    Follow-Up: At [facility], you and your health needs are our priority.  As part of our continuing mission to provide you with exceptional heart care, we have created designated Provider Care Teams.  These Care Teams include your primary Cardiologist (physician) and Advanced Practice Providers (APPs -  Physician Assistants and Nurse Practitioners) who all work together to provide you with the care you need, when you need it.  We recommend signing up for the patient portal called "MyChart".  Sign up information is provided on this After Visit Summary.  MyChart is used to connect with patients for Virtual Visits (Telemedicine).  Patients are able to view lab/test  results, encounter notes, upcoming appointments, etc.  Non-urgent messages can be sent to your provider as well.   To learn more about what you can do with MyChart, go to [email or web address].    Your next appointment:   2 week(s)   Provider:   Cadence Kathlen Mody, PA-C     *PLEASE CONTACT OUR OFFICE WITH YOUR FLUID PILL NAME/DOSE/INSTRUCTIONS.  [phone]

## 2023-03-07 MED ORDER — FUROSEMIDE 20 MG PO TABS: 20.0000 mg | ORAL_TABLET | Freq: Two times a day (BID) | ORAL | 3 refills | Status: DC

## 2023-03-07 NOTE — Telephone Encounter (Signed)
Pt's wife made aware and verbalized understanding.   Furth, Cadence H, PA-C  YouYesterday (7:55 AM)    Ok so if patient is taking lasix 20mg  BID, lets increase this to lasix 40mg  BID for 5 days, then back down to the 20mg  BID.

## 2023-03-12 DIAGNOSIS — D539 Nutritional anemia, unspecified: Secondary | ICD-10-CM | POA: Diagnosis not present

## 2023-03-19 ENCOUNTER — Ambulatory Visit: Payer: Medicare Other | Attending: Cardiology

## 2023-03-19 DIAGNOSIS — I5032 Chronic diastolic (congestive) heart failure: Secondary | ICD-10-CM | POA: Diagnosis not present

## 2023-03-19 DIAGNOSIS — Z950 Presence of cardiac pacemaker: Secondary | ICD-10-CM

## 2023-03-20 ENCOUNTER — Ambulatory Visit: Payer: Medicare Other | Attending: Medical | Admitting: Medical

## 2023-03-20 ENCOUNTER — Other Ambulatory Visit (HOSPITAL_COMMUNITY): Payer: Self-pay

## 2023-03-20 ENCOUNTER — Encounter: Payer: Self-pay | Admitting: Medical

## 2023-03-20 ENCOUNTER — Other Ambulatory Visit: Payer: Self-pay | Admitting: *Deleted

## 2023-03-20 VITALS — BP 117/73 | HR 60 | Ht 68.0 in | Wt 246.4 lb

## 2023-03-20 DIAGNOSIS — N184 Chronic kidney disease, stage 4 (severe): Secondary | ICD-10-CM

## 2023-03-20 DIAGNOSIS — I2510 Atherosclerotic heart disease of native coronary artery without angina pectoris: Secondary | ICD-10-CM | POA: Diagnosis not present

## 2023-03-20 DIAGNOSIS — I5023 Acute on chronic systolic (congestive) heart failure: Secondary | ICD-10-CM | POA: Diagnosis not present

## 2023-03-20 DIAGNOSIS — I442 Atrioventricular block, complete: Secondary | ICD-10-CM | POA: Diagnosis not present

## 2023-03-20 DIAGNOSIS — I10 Essential (primary) hypertension: Secondary | ICD-10-CM

## 2023-03-20 DIAGNOSIS — I440 Atrioventricular block, first degree: Secondary | ICD-10-CM | POA: Diagnosis not present

## 2023-03-20 DIAGNOSIS — E782 Mixed hyperlipidemia: Secondary | ICD-10-CM

## 2023-03-20 DIAGNOSIS — I4821 Permanent atrial fibrillation: Secondary | ICD-10-CM

## 2023-03-20 MED ORDER — POTASSIUM CHLORIDE CRYS ER 20 MEQ PO TBCR: EXTENDED_RELEASE_TABLET | ORAL | 0 refills | Status: DC

## 2023-03-20 MED ORDER — FUROSCIX 80 MG/10ML ~~LOC~~ CTKT: 1.0000 | CARTRIDGE | SUBCUTANEOUS | 0 refills | Status: DC

## 2023-03-20 MED ORDER — METOLAZONE 2.5 MG PO TABS: ORAL_TABLET | ORAL | 0 refills | Status: DC

## 2023-03-20 NOTE — Progress Notes (Signed)
Cardiology Office Note:    Date:  03/20/2023   ID:  MATRIX SCHWIND, DOB 12-06-1946, MRN [id number]  PCP:  [place] Pink, MD  [facility] Cardiologist:  Ida Rogue, MD  [facility] Electrophysiologist:  [name], MD (Inactive)   Referring MD: Maryland Pink, MD   Chief Complaint: lower leg edema follow-up  History of Present Illness:    Jesus Hendrix is a 77 y.o. male with a hx of persistent atrial fibrillation, chronic back pain, CAD status post CABG in 2007, hyperlipidemia, obesity, kidney cancer, CKD stage 3-4, status post laser ablation at [facility], ANCA associated vasculitis, hydrocephalus s/p shunt 06/2021, Wegener's granulomatosis, medication noncompliance, complete heart block status post pacemaker, HFrEF who presents for lower leg edema follow-up.   Patient has a history of CAD status post CABG in 2007.  Echo in January 2009 showed mild MR, normal LV function, aortic valve sclerosis without significant stenosis.  Stress test January 2009 showed large region of decreased perfusion mild to moderate severity in the inferior wall consistent with possible old MI.  Defect was worse in the inferior apical region.  No evidence of ischemia EF 55%.  Echo in August 2020 showed LVEF 35 to 40%, mild LVH, normal RV size and function, moderately elevated RV pressures, severely dilated left atrium, mild to moderate MR, moderate TR, mild AI, moderate stenosis of the aortic valve. Myoview lexiscan in 09/2019 showed a medium defect of moderate severity in the apical inferior and apex location, likely due to previous infarct, intermediate risk study, no reversible ischemia noted.    Patient was seen March 2023 was overall doing well from a cardiac perspective.  Repeat echo was ordered which showed persistently low LVEF 30 to 35%, global hypokinesis, moderately reduced RV SF.  Patient was last seen 03/05/2023 with lower leg edema, notes of breath and abdominal tightness.  PCP had increase Lasix for 3  days.  There was a discrepancy in which diuretic patient was taking, our records showed he was taking torsemide.  We clarified patient was taking Lasix and this was doubled for 5 days to 40 mg twice daily, then back down to 20 mg twice daily.  Repeat echo was ordered.  Today, the patient is still volume overloaded on exam. They reported increased dose of lasix didn't help with lower leg edema. He is visibly short of breath with obvious lower leg edema. He denies chest pain. We discussed ER evaluation, but patient is not wanting to go to the ER. I discussed the patient with Dr. Caryl Comes, who spoke with the patient. Patient refused ER evaluation again. We called Dr. Haroldine Kempa who recommended Furoscix, which patient agreed to. Patient was taken upstairs to the advanced heart failure clinic.   Past Medical History:  Diagnosis Date   Anxiety    Aortic stenosis    a. mild on echo 04/2014   AV block, Mobitz 2    a. 03/2018 s/p SJM Jennette Banker MP RF model CD [id number] BiV PPM (ser #: [id number]).   Choledocholithiasis    Chronic systolic CHF (congestive heart failure)    a. echo 04/2014: EF 45-50%, nl LV dia fxn, mild AS, LA mildly dilated, PASP nl; b. 07/2019 Echo: EF 35-40%, diff HK, Nl RV fxn, RVSP 53.75mmHg. Sev dil LA. Mod dil RA. Mild to mod MR. Mod TR. Mod AS. Ao root 37cm.   CKD (chronic kidney disease), stage IV    COPD (chronic obstructive pulmonary disease)    Not on home oxygen  Coronary artery disease    a. status post CABG in 2007; b. nuc stress test 2014: small region of mild ischemia in the inferior territory, EF 61%, no ischemia, scan similar to prior scan in 2009; c. 07/2016 MV: No ischemia.   Exogenous obesity    GERD (gastroesophageal reflux disease)    Gout    Hiatal hernia    Hip pain, chronic 08/03/2016   Waiting to have hip replacement pending NM stress test results per patient.   History of colonic diverticulitis    History of kidney stones    History of renal cell carcinoma    a.  s/p laser ablation    Hyperlipidemia    Hypertension    Memory loss due to medical condition    possibly due to fluid on his brain   Moderate aortic stenosis    a. 07/2019 Echo: Mod AS.   Near syncope    a. 3 separate events in 2017   Nephrolithiasis    Panic attacks    Peripheral vascular disease    Permanent atrial fibrillation 06/06/2016   a. Dx 06/06/2016; b. CHADS2VASc => 5 (CHF, HTN, age x 1, DM, vascular disease)-->was on Eliquis but d/c'd 2/2 anemia and hemoptysis.   Presence of permanent cardiac pacemaker    Spinal stenosis    Temporomandibular joint pain dysfunction syndrome    Umbilical hernia    Vitamin D deficiency    Wegener's granulomatosis with renal involvement     Past Surgical History:  Procedure Laterality Date   CARDIAC CATHETERIZATION     CHOLECYSTECTOMY     COLONOSCOPY N/A 06/03/2015   Procedure: COLONOSCOPY;  Surgeon: Lucilla Lame, MD;  Location: [place];  Service: Gastroenterology;  Laterality: N/A;   CORONARY ARTERY BYPASS GRAFT  2004   CABG x 3   EYE SURGERY     HERNIA REPAIR     x 2   HIP SURGERY     right hip replacement   INTRAOCULAR LENS INSERTION     KIDNEY SURGERY     cancer   NOSE SURGERY     PACEMAKER IMPLANT N/A 04/05/2018   SJM Biventricular pacemaker implanted by Dr Rayann Heman for second degree AV block, EF < 50% and permanent afib   POLYPECTOMY  06/03/2015   Procedure: POLYPECTOMY;  Surgeon: Lucilla Lame, MD;  Location: [place];  Service: Gastroenterology;;   PROSTATE SURGERY     TOTAL HIP ARTHROPLASTY Left 12/19/2016   Procedure: LEFT TOTAL HIP ARTHROPLASTY;  Surgeon: Garald Balding, MD;  Location: [place];  Service: Orthopedics;  Laterality: Left;   VENTRICULOPERITONEAL SHUNT Right 07/08/2021   Procedure: Shunt Placment - right;  Surgeon: Earnie Larsson, MD;  Location: [place];  Service: Neurosurgery;  Laterality: Right;    Current Medications: Current Meds  Medication Sig   acetaminophen (TYLENOL) 325 MG tablet Take  325-650 mg by mouth every 6 (six) hours as needed for pain.   clonazePAM (KLONOPIN) 1 MG tablet Take 1 mg by mouth at bedtime.   donepezil (ARICEPT) 5 MG tablet Take 5 mg by mouth at bedtime.   ELIQUIS 5 MG TABS tablet TAKE 1 TABLET(5 MG) BY MOUTH TWICE DAILY   famotidine (PEPCID) 20 MG tablet TAKE 1 TABLET(20 MG) BY MOUTH EVERY DAY AT BEDTIME AS NEEDED FOR HEART BURN   fluticasone (FLONASE) 50 MCG/ACT nasal spray Place 1-2 sprays into both nostrils daily as needed for allergies.   furosemide (LASIX) 20 MG tablet Take 1 tablet (20 mg total) by  mouth 2 (two) times daily.   losartan (COZAAR) 25 MG tablet Take 1 tablet (25 mg total) by mouth daily.   meloxicam (MOBIC) 15 MG tablet Take 15 mg by mouth daily as needed for pain.   NON FORMULARY Take 1 capsule by mouth in the morning and at bedtime. Pure Encapsulations Uric Acid Formula   sertraline (ZOLOFT) 50 MG tablet Take 50 mg by mouth daily.     Allergies:   Clonazepam   Social History   Socioeconomic History   Marital status: Married    Spouse name: Not on file   Number of children: Not on file   Years of education: Not on file   Highest education level: Not on file  Occupational History   Not on file  Tobacco Use   Smoking status: Former    Packs/day: 0.50    Years: 25.00    Additional pack years: 0.00    Total pack years: 12.50    Types: Cigarettes    Quit date: 12/29/1974    Years since quitting: 48.2   Smokeless tobacco: Never  Vaping Use   Vaping Use: Never used  Substance and Sexual Activity   Alcohol use: Not Currently    Alcohol/week: 2.0 standard drinks of alcohol    Types: 2 Cans of beer per week    Comment: social   Drug use: No   Sexual activity: Not on file  Other Topics Concern   Not on file  Social History Narrative   Not on file   Social Determinants of Health   Financial Resource Strain: Not on file  Food Insecurity: Not on file  Transportation Needs: Not on file  Physical Activity: Not on file   Stress: Not on file  Social Connections: Not on file     Family History: The patient's family history includes Heart disease in his brother, mother, and sister.  ROS:   Please see the history of present illness.     All other systems reviewed and are negative.  EKGs/Labs/Other Studies Reviewed:    The following studies were reviewed today:  Echo 01/2021  1. Left ventricular ejection fraction, by estimation, is 30 to 35%. The  left ventricle has moderate to severely decreased function. The left  ventricle demonstrates global hypokinesis. Left ventricular diastolic  function could not be evaluated.   2. Right ventricular systolic function is moderately reduced. The right  ventricular size is normal. There is moderately elevated pulmonary artery  systolic pressure.   3. The mitral valve is grossly normal. No evidence of mitral valve  regurgitation.   4. Tricuspid valve regurgitation not assessed.   5. The aortic valve was not well visualized. Aortic valve regurgitation  is not visualized.   6. Pulmonic valve regurgitation not assessed.    Myoview lexiscan 09/2019 Defect 1: There is a medium defect of moderate severity present in the apical inferior and apex location. This defect is fixed and likely represent a previous infarct. This is an intermediate risk study mainly due to reduced EF. Nuclear stress EF: 37%. No reversible ischemia is noted.     Echo 07/2019 1. The left ventricle has moderately reduced systolic function, with an  ejection fraction of 35-40%. The cavity size was normal. There is mildly  increased left ventricular wall thickness. Left ventricular diastolic  Doppler parameters are indeterminate.  Left ventricular diffuse hypokinesis.   2. The right ventricle has normal systolic function. The cavity was  normal. There is no increase in right ventricular  wall thickness. Right  ventricular systolic pressure is moderately elevated with an estimated  pressure of  53.3 mmHg.   3. Left atrial size was severely dilated.   4. Right atrial size was moderately dilated.   5. Mitral valve regurgitation is mild to moderate   6. Tricuspid valve regurgitation is moderate.   7. The aortic valve was not well visualized. Moderate calcification of  the aortic valve. Aortic valve regurgitation is mild by color flow  Doppler. Moderate stenosis of the aortic valve.   8. There is dilatation of the aortic root.3.7 cm    Echo 03/2018   Study Conclusions   - Left ventricle: The cavity size was normal. There was mild    concentric hypertrophy. Systolic function was mildly reduced. The    estimated ejection fraction was in the range of 45% to 50%.    Hypokinesis and scarring of the inferolateral myocardium;    consistent with infarction in the distribution of the right    coronary or left circumflex coronary artery.  - Ventricular septum: Septal motion showed paradox. These changes    are consistent with intraventricular conduction    delay/idioventricular rhythm.  - Aortic valve: There was mild to moderate stenosis. Valve area    (VTI): 1.56 cm^2. Valve area (Vmax): 1.5 cm^2. Valve area    (Vmean): 1.48 cm^2.  - Mitral valve: Calcified annulus. There was moderate    regurgitation.  - Left atrium: The atrium was severely dilated.  - Right ventricle: The cavity size was mildly dilated. Systolic    function was moderately reduced.  - Right atrium: The atrium was severely dilated.  - Tricuspid valve: There was moderate regurgitation.  - Pulmonary arteries: Systolic pressure was mildly increased. PA    peak pressure: 44 mm Hg (S).   EKG:  EKG is ordered today. V paced rhythm Recent Labs: 03/05/2023: B Natriuretic Peptide 339.7  Recent Lipid Panel    Component Value Date/Time   CHOL 132 04/05/2018 0236   TRIG 58 04/05/2018 0236   HDL 29 (L) 04/05/2018 0236   CHOLHDL 4.6 04/05/2018 0236   VLDL 12 04/05/2018 0236   LDLCALC 91 04/05/2018 0236     Physical  Exam:    VS:  BP 117/73 (BP Location: Left Arm, Patient Position: Sitting, Cuff Size: Normal)   Pulse 60   Ht 5\' 8"  (1.727 m)   Wt 246 lb 6.4 oz (111.8 kg)   SpO2 97%   BMI 37.46 kg/m     Wt Readings from Last 3 Encounters:  03/20/23 246 lb 6.4 oz (111.8 kg)  03/05/23 247 lb 12.8 oz (112.4 kg)  05/29/22 230 lb (104.3 kg)     GEN:  Well nourished, well developed in no acute distress HEENT: Normal NECK: + JVD; No carotid bruits LYMPHATICS: No lymphadenopathy CARDIAC: RRR, + murmur, rubs, gallops RESPIRATORY:  diminished at bases ABDOMEN: Soft, non-tender, non-distended MUSCULOSKELETAL:  3+ lower leg edema; No deformity  SKIN: Warm and dry NEUROLOGIC:  Alert and oriented x 3 PSYCHIATRIC:  Normal affect   ASSESSMENT:    1. Acute on chronic systolic heart failure   2. Coronary artery disease involving native coronary artery of native heart without angina pectoris   3. Complete heart block   4. CKD (chronic kidney disease), stage IV   5. First degree AV block   6. Essential hypertension   7. Hyperlipidemia, mixed   8. Permanent atrial fibrillation    PLAN:    In order of problems listed  above:  Acute on chronic HFrEF Patient remains significantly volume overloaded despite increase in lasix for 5 days. Weight is about the same as prior. Prior BNP 339 and Scr around 1.9 (baseline).  Given failure of oral diuretics, I recommended ER evaluation, but patient refused. Patient was dicussed with Dr. Caryl Comes who also discussed ER evaluation, but patient again refused. We discussed with Advanced heart failure team, who is able to administer Furoscix. Patient failed outpatient oral diuretics and the patient is not wanting to be admitted. We are sending the patient to advanced heart failure clinic for Furoscix as stated above. They will continue to follow diuresis closely. Follow-up echo has been ordered.    CAD s/p remote CABG Patient denies chest pain. No further ischemic work-up at this  time. Plan as above. No ASA given Eliquis. Not on BB  or cholesterol therapy.   Complete heart block s/p PPM V-paced rhythm on EKG. PPM followed by EP.   Moderate Aortic Valve stenosis Moderate Aortic Valve stenosis on echo in 2020. Follow-up echo has been ordered. Significant murmur on exam.   HTN BP is good today, continue current medications.   HLD Most recent labs showed LDL 131. Patient is not on lipid therapy. Can discuss at follow-up.   Permanent Afib Continue Eliquis for stroke ppx. CHADSVASC of 5. Not on BB, unclear if patient had tolerance in the past. Needs to follow-up with EP.   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 He has not seen nephrology in a while, I recommended follow-up.   Disposition: Follow up in 1 month(s) with MD/APP    Signed, Adanya Sosinski Ninfa Meeker, PA-C  03/20/2023 12:23 PM    [facility]

## 2023-03-20 NOTE — Patient Instructions (Signed)
Medication Instructions:  Your physician recommends that you continue on your current medications as directed. Please refer to the Current Medication list given to you today.  *If you need a refill on your cardiac medications before your next appointment, please call your pharmacy*   Lab Work: -None ordered If you have labs (blood work) drawn today and your tests are completely normal, you will receive your results only by: [place] (if you have MyChart) OR A paper copy in the mail If you have any lab test that is abnormal or we need to change your treatment, we will call you to review the results.   Testing/Procedures: -None ordered  Follow-Up: At [facility], you and your health needs are our priority.  As part of our continuing mission to provide you with exceptional heart care, we have created designated Provider Care Teams.  These Care Teams include your primary Cardiologist (physician) and Advanced Practice Providers (APPs -  Physician Assistants and Nurse Practitioners) who all work together to provide you with the care you need, when you need it.  We recommend signing up for the patient portal called "MyChart".  Sign up information is provided on this After Visit Summary.  MyChart is used to connect with patients for Virtual Visits (Telemedicine).  Patients are able to view lab/test results, encounter notes, upcoming appointments, etc.  Non-urgent messages can be sent to your provider as well.   To learn more about what you can do with MyChart, go to [email or web address].    Your next appointment:   1 month(s)  Provider:   You may see Ida Rogue, MD or one of the following Advanced Practice Providers on your designated Care Team:   Murray Hodgkins, NP Christell Faith, PA-C Cadence Kathlen Mody, PA-C Gerrie Nordmann, NP   Other Instructions -None

## 2023-03-23 ENCOUNTER — Ambulatory Visit (HOSPITAL_BASED_OUTPATIENT_CLINIC_OR_DEPARTMENT_OTHER): Payer: Medicare Other | Admitting: Internal Medicine

## 2023-03-23 ENCOUNTER — Other Ambulatory Visit
Admission: RE | Admit: 2023-03-23 | Discharge: 2023-03-23 | Disposition: A | Discharge status: Home / Self Care | Payer: Medicare Other | Source: Ambulatory Visit | Attending: Internal Medicine | Admitting: Internal Medicine

## 2023-03-23 ENCOUNTER — Encounter: Payer: Self-pay | Admitting: Internal Medicine

## 2023-03-23 VITALS — BP 132/60 | HR 59 | Wt 235.6 lb

## 2023-03-23 DIAGNOSIS — I442 Atrioventricular block, complete: Secondary | ICD-10-CM

## 2023-03-23 DIAGNOSIS — R600 Localized edema: Secondary | ICD-10-CM

## 2023-03-23 DIAGNOSIS — Z950 Presence of cardiac pacemaker: Secondary | ICD-10-CM | POA: Diagnosis not present

## 2023-03-23 DIAGNOSIS — I5032 Chronic diastolic (congestive) heart failure: Secondary | ICD-10-CM

## 2023-03-23 DIAGNOSIS — N184 Chronic kidney disease, stage 4 (severe): Secondary | ICD-10-CM | POA: Diagnosis not present

## 2023-03-23 DIAGNOSIS — I5022 Chronic systolic (congestive) heart failure: Secondary | ICD-10-CM

## 2023-03-23 LAB — BASIC METABOLIC PANEL
Anion gap: 11 (ref 5–15)
BUN: 50 mg/dL — ABNORMAL HIGH (ref 8–23)
CO2: 28 mmol/L (ref 22–32)
Calcium: 8.9 mg/dL (ref 8.9–10.3)
Chloride: 94 mmol/L — ABNORMAL LOW (ref 98–111)
Creatinine, Ser: 1.86 mg/dL — ABNORMAL HIGH (ref 0.61–1.24)
GFR, Estimated: 37 mL/min — ABNORMAL LOW (ref >60)
Glucose, Bld: 154 mg/dL — ABNORMAL HIGH (ref 70–99)
Potassium: 3.5 mmol/L (ref 3.5–5.1)
Sodium: 133 mmol/L — ABNORMAL LOW (ref 135–145)

## 2023-03-23 LAB — BRAIN NATRIURETIC PEPTIDE: B Natriuretic Peptide: 422.2 pg/mL — ABNORMAL HIGH (ref 0.0–100.0)

## 2023-03-23 MED ORDER — TORSEMIDE 20 MG PO TABS
60.0000 mg | ORAL_TABLET | Freq: Two times a day (BID) | ORAL | 3 refills | Status: DC
Start: 2023-03-23 — End: 2023-04-03

## 2023-03-23 NOTE — Progress Notes (Unsigned)
ADVANCED HF CLINIC CONSULT NOTE  Referring Physician: Sherryl MangesSteven Klein, MD Primary Care: Jesus Hendrix, [name], MD Primary Cardiologist: Jesus Nordmannimothy Gollan, MD  HPI:  Jesus Hendrix is a 77 y.o. 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 Graciela Hendrix for further management of his HF and volume overload.   Echo 2007 EF 55-60% Myoview 2009 EF 55% inferior scar Echo 8/20 EF 35-40% moderate AS/MR/TR. Severe LAE Echo 2/22 EF 30-35%, global hypokinesis, moderately reduced RV SF. Moderate AS (mean gradient 20)   [facility]. Lasix recently increased to 40 bid by PCP for several days and then back down to 20 daily. Was markedly volume overloaded. Refused hospital admit. Started on Furoscix x 3 doses + metolazone 2.5 x 2 days. Referred here.   Here with his wife. He is very HOH and his wife has to answer most questions for him. Says he feels old. No energy. Sits in chair all day. Wife says he doesn't do anything. Smokes an occasional cigar. No CP. Denies SOB. Sleeps in chair most of the time + orthopnea. Wife says he doesn't snore. + LE edema  L >R. Lost 11 pounds this week. He has to self-cath himself.  Pacer interrogation 98% biv pacing  Review of Systems: [y] = yes, [ ]  = no   General: Weight gain [ ] ; Weight loss [place][y ]; Anorexia [ ] ; Fatigue [ y]; Fever [ ] ; Chills [ ] ; Weakness [ y]  Cardiac: Chest pain/pressure [ ] ; Resting SOB [ ] ; Exertional SOB [ ] ; Orthopnea [ y]; Pedal Edema [ y]; Palpitations [ ] ; Syncope [ ] ; Presyncope [ ] ; Paroxysmal nocturnal dyspnea[ ]   Pulmonary: Cough [ ] ; Wheezing[ ] ; Hemoptysis[ ] ; Sputum [ ] ; Snoring [ ]   GI: Vomiting[ ] ; Dysphagia[ ] ; Melena[ ] ; Hematochezia [ ] ; Heartburn[ ] ; Abdominal pain [ ] ;  Constipation [ ] ; Diarrhea [ ] ; BRBPR [ ]   GU: Hematuria[ ] ; Dysuria [ ] ; Nocturia[ ]   Vascular: Pain in legs with walking [ y]; Pain in feet with lying flat [ ] ; Non-healing sores [ ] ; Stroke [ ] ; TIA [ ] ; Slurred speech [ ] ;  Neuro: Headaches[ ] ; Vertigo[ ] ; Seizures[ ] ; Paresthesias[ ] ;Blurred vision [ ] ; Diplopia [ ] ; Vision changes [ ]   Ortho/Skin: Arthritis [ y]; Joint pain [ y]; Muscle pain [ ] ; Joint swelling [ ] ; Back Pain [ ] ; Rash [ ]   Psych: Depression[ ] ; Anxiety[y ]  Heme: Bleeding problems [ ] ; Clotting disorders [ ] ; Anemia [ ]   Endocrine: Diabetes [ ] ; Thyroid dysfunction[ ]    Past Medical History:  Diagnosis Date   Anxiety    Aortic stenosis    a. mild on echo 04/2014   AV block, Mobitz 2    a. 03/2018 s/p SJM Quarda Allure MP RF model CD [id number] BiV PPM (ser #: [id number]).   Choledocholithiasis    Chronic systolic CHF (congestive heart failure)    a. echo 04/2014: EF 45-50%, nl LV dia fxn, mild AS, LA mildly dilated, PASP nl; b. 07/2019 Echo: EF 35-40%, diff HK, Nl RV fxn, RVSP 53.753mmHg. Sev dil LA. Mod dil RA. Mild to mod MR. Mod TR. Mod AS. Ao root 37cm.   CKD (chronic kidney disease), stage IV    COPD (chronic obstructive  pulmonary disease)    Not on home oxygen   Coronary artery disease    a. status post CABG in 2007; b. nuc stress test 2014: small region of mild ischemia in the inferior territory, EF 61%, no ischemia, scan similar to prior scan in 2009; c. 07/2016 MV: No ischemia.   Exogenous obesity    GERD (gastroesophageal reflux disease)    Gout    Hiatal hernia    Hip pain, chronic 08/03/2016   Waiting to have hip replacement pending NM stress test results per patient.   History of colonic diverticulitis    History of kidney stones    History of renal cell carcinoma    a. s/p laser ablation    Hyperlipidemia    Hypertension    Memory loss due to medical condition    possibly due to fluid on his brain   Moderate aortic stenosis    a. 07/2019 Echo: Mod AS.    Near syncope    a. 3 separate events in 2017   Nephrolithiasis    Panic attacks    Peripheral vascular disease    Permanent atrial fibrillation 06/06/2016   a. Dx 06/06/2016; b. CHADS2VASc => 5 (CHF, HTN, age x 1, DM, vascular disease)-->was on Eliquis but d/c'd 2/2 anemia and hemoptysis.   Presence of permanent cardiac pacemaker    Spinal stenosis    Temporomandibular joint pain dysfunction syndrome    Umbilical hernia    Vitamin D deficiency    Wegener's granulomatosis with renal involvement     Current Outpatient Medications  Medication Sig Dispense Refill   acetaminophen (TYLENOL) 325 MG tablet Take 325-650 mg by mouth every 6 (six) hours as needed for pain.     clonazePAM (KLONOPIN) 1 MG tablet Take 1 mg by mouth as needed.     donepezil (ARICEPT) 5 MG tablet Take 5 mg by mouth at bedtime.     ELIQUIS 5 MG TABS tablet TAKE 1 TABLET(5 MG) BY MOUTH TWICE DAILY 180 tablet 0   famotidine (PEPCID) 20 MG tablet TAKE 1 TABLET(20 MG) BY MOUTH EVERY DAY AT BEDTIME AS NEEDED FOR HEART BURN 90 tablet 0   fluticasone (FLONASE) 50 MCG/ACT nasal spray Place 1-2 sprays into both nostrils daily as needed for allergies.     furosemide (LASIX) 20 MG tablet Take 1 tablet (20 mg total) by mouth 2 (two) times daily. 180 tablet 3   losartan (COZAAR) 25 MG tablet Take 1 tablet (25 mg total) by mouth daily. 90 tablet 0   meloxicam (MOBIC) 15 MG tablet Take 15 mg by mouth daily as needed for pain.     sertraline (ZOLOFT) 50 MG tablet Take 50 mg by mouth daily.     NON FORMULARY Take 1 capsule by mouth in the morning and at bedtime. Pure Encapsulations Uric Acid Formula     No current facility-administered medications for this visit.    Allergies  Allergen Reactions   Clonazepam Other (See Comments)    Patient is confused for up to 12+ hours after administration. (Patient is still taking)        Social History   Socioeconomic History   Marital status: Married    Spouse name: Not on file    Number of children: Not on file   Years of education: Not on file   Highest education level: Not on file  Occupational History   Not on file  Tobacco Use   Smoking status: Former    Packs/day: 0.50  Years: 25.00    Additional pack years: 0.00    Total pack years: 12.50    Types: Cigarettes    Quit date: 12/29/1974    Years since quitting: 48.2   Smokeless tobacco: Never  Vaping Use   Vaping Use: Never used  Substance and Sexual Activity   Alcohol use: Not Currently    Alcohol/week: 2.0 standard drinks of alcohol    Types: 2 Cans of beer per week    Comment: social   Drug use: No   Sexual activity: Not on file  Other Topics Concern   Not on file  Social History Narrative   Not on file   Social Determinants of Health   Financial Resource Strain: Not on file  Food Insecurity: Not on file  Transportation Needs: Not on file  Physical Activity: Not on file  Stress: Not on file  Social Connections: Not on file  Intimate Partner Violence: Not on file      Family History  Problem Relation Age of Onset   Heart disease Mother    Heart disease Sister    Heart disease Brother     Vitals:   03/23/23 1020  BP: 132/60  Pulse: (!) 59  SpO2: 98%  Weight: 235 lb 9.6 oz (106.9 kg)    PHYSICAL EXAM: General:  Elderly. Very HOH  No respiratory difficulty HEENT: normal Neck: supple. JVP to ear . Carotids 2+ bilat; + bruits. No lymphadenopathy or thryomegaly appreciated. Cor: PMI nondisplaced. Regular rate & rhythm. 3/6 AS s2 only mildly depressed Lungs: clear Abdomen: soft, nontender, nondistended. No hepatosplenomegaly. No bruits or masses. Good bowel sounds. Extremities: no cyanosis, clubbing, rash, 1+ R, 2+ LE pedal edema Neuro: alert & oriented x 3, cranial nerves grossly intact. moves all 4 extremities w/o difficulty. Affect pleasant.  ECG:   ASSESSMENT & PLAN:  1. 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 No ischemia - Echo 8/20 EF 35-40% moderate AS/MR/TR. Severe LAE - Echo 2/22 EF 30-35%, global hypokinesis, moderately reduced RV SF. Moderate AS (mean gradient 20) - Assessment of functional capacity limited by inactivity. Suspect NYHA III - Still markedly volume overloaded - Switch lasix 40 daily to torsemide 60 daily. May need metolazone 1-2x/week (will reassess next week) - Place compression hose - Continue losartan 25 daily  - GDMT has been limited by CKD and noncompliance. Will f/u in HF Clinic and titrate as able - Ideally would add SGLT2i soon but would like him to see Nephrology first given complicated renal issues - Not ICD candidate given poor functional capacity - Labs today and follow closely with diuresis  2. CAD s/p CABG 2007 -  Myoview 10/20 EF 37% inferior scar. No ischemia - Off ASA due to Eliquis  - Unclear why he is not on statin - No s/s angina - Managed by Dr. Mariah MillingGollan  3. Aortic stenosis - Moderate by echo in 2022 - Pending repeat  4. Permanent AF - on Eliquis.  - Rate controlled  5. H/o CHB s/p STJ CRT-P - Followed by EP - 98% biv pacing on ECG   6. CKD IV - baseline SCr 2.3-2.5 - Refer back to Nephrology - ideally needs SGLT2i   7. Fatigue - suspect he has OSA but not interested in CPAP  Arvilla Meresaniel Keith Felten, MD  11:05 AM

## 2023-03-23 NOTE — Patient Instructions (Signed)
Please wear your compression hose.   You have been referred to nephrology they will call you to schedule an appointment.  Routine lab work today. Will notify you of abnormal results  STOP Lasix  START Torsemide 60mg  daily  Follow up with Jesus Hendrix next week  Follow up with Jesus Hendrix in 3 weeks  Do the following things EVERYDAY: Weigh yourself in the morning before breakfast. Write it down and keep it in a log. Take your medicines as prescribed Eat low salt foods--Limit salt (sodium) to 2000 mg per day.  Stay as active as you can everyday Limit all fluids for the day to less than 2 liters

## 2023-03-23 NOTE — Progress Notes (Signed)
EPIC Encounter for ICM Monitoring  Patient Name: Jesus Hendrix is a 77 y.o. male Date: 03/23/2023 Primary Care Physican: Jerl Mina, MD Primary Cardiologist: Gollan/Bensimhon Electrophysiologist: Lalla Brothers 03/14/2022 Office Weight: 236 lbs 05/29/2022 Office Weight: 230 lbs 03/23/2023 Office Weight: 235 lbs         Patient seen by Dr Gala Romney in HF clinic 4/5.  Pt received Furoscix on 4/2 due to extra oral diuretics did not resolve fluid symptoms of lower leg edema    CorVue Thoracic impedance normal but was suggesting possible fluid accumulation from 2/28-3/10, 3/15-3/21 and 3/29-4/1.   Prescribed: Torsemide 20 mg take 3 tablet(s) (60 mg total) by mouth twice a day (increased 03/23/23)   Labs: 03/23/2023 Creatinine 1.86, BUN 50, Potassium 3.5, Sodium 133, GFR 37 03/01/2023 Creatinine 1.9,   BUN 49, Potassium 5.0, Sodium 132, GFR 36 A complete set of results can be found in Results Review.   Recommendations:  Recommendations given by Dr Gala Romney at 4/5 OV.    Follow-up plan: ICM clinic phone appointment on  to recheck fluid levels.     91 day device clinic remote transmission 05/29/2023.       EP/Cardiology Office Visits:  03/30/2023 with Clarisa Kindred, NP.  04/13/2023 with Dr Gala Romney.    Recall 11/25/2022 with Dr Lalla Brothers.   Copy of ICM check sent to Dr. Lalla Brothers.   3 month ICM trend: 03/19/2023.    12-14 Month ICM trend:     Karie Soda, RN 03/23/2023 12:37 PM

## 2023-03-23 NOTE — Progress Notes (Signed)
REDS VEST READING= 37 CHEST RULER=38  VEST FITTING TASKS: POSTURE=sitting HEIGHT MARKER=D CENTER STRIP=aligned  COMMENTS:

## 2023-03-26 DIAGNOSIS — R339 Retention of urine, unspecified: Secondary | ICD-10-CM | POA: Diagnosis not present

## 2023-03-27 ENCOUNTER — Telehealth: Payer: Self-pay

## 2023-03-27 DIAGNOSIS — I5032 Chronic diastolic (congestive) heart failure: Secondary | ICD-10-CM

## 2023-03-27 DIAGNOSIS — I5042 Chronic combined systolic (congestive) and diastolic (congestive) heart failure: Secondary | ICD-10-CM

## 2023-03-27 MED ORDER — POTASSIUM CHLORIDE CRYS ER 20 MEQ PO TBCR: 20.0000 meq | EXTENDED_RELEASE_TABLET | Freq: Every day | ORAL | 3 refills | Status: DC

## 2023-03-27 NOTE — Telephone Encounter (Addendum)
Spoke with pt and pt's spouse, Jesus Hendrix, to review below message from provider. [name] states that she will give patient two potassium tablets (40 meq) today and one 20 meq daily after that. They verbalized understanding that labs with potassium level will be rechecked on 4/12 when he has appointment with [facility]. They deny any other questions or concerns at this time.  Potassium prescription sent to pt's pharmacy. BMET order placed for 4/12 per MD instructions.  ----- Message from Dolores Patty, MD sent at 03/25/2023  4:20 PM EDT ----- Add kdur 20 daily. Take 2 tabs on the first day. Repeat BMET when he sees Inetta Fermo this week.

## 2023-03-30 ENCOUNTER — Other Ambulatory Visit: Payer: Self-pay | Admitting: *Deleted

## 2023-03-30 ENCOUNTER — Encounter: Payer: Self-pay | Admitting: Family

## 2023-03-30 ENCOUNTER — Ambulatory Visit (HOSPITAL_BASED_OUTPATIENT_CLINIC_OR_DEPARTMENT_OTHER): Payer: Medicare Other | Admitting: Family

## 2023-03-30 ENCOUNTER — Other Ambulatory Visit
Admission: RE | Admit: 2023-03-30 | Discharge: 2023-03-30 | Disposition: A | Discharge status: Home / Self Care | Payer: Medicare Other | Source: Ambulatory Visit | Attending: Family | Admitting: Family

## 2023-03-30 VITALS — BP 123/64 | HR 62 | Wt 240.6 lb

## 2023-03-30 DIAGNOSIS — I5022 Chronic systolic (congestive) heart failure: Secondary | ICD-10-CM | POA: Diagnosis not present

## 2023-03-30 DIAGNOSIS — I2510 Atherosclerotic heart disease of native coronary artery without angina pectoris: Secondary | ICD-10-CM | POA: Diagnosis not present

## 2023-03-30 DIAGNOSIS — I5032 Chronic diastolic (congestive) heart failure: Secondary | ICD-10-CM | POA: Diagnosis present

## 2023-03-30 DIAGNOSIS — I350 Nonrheumatic aortic (valve) stenosis: Secondary | ICD-10-CM | POA: Diagnosis not present

## 2023-03-30 DIAGNOSIS — I10 Essential (primary) hypertension: Secondary | ICD-10-CM

## 2023-03-30 DIAGNOSIS — I442 Atrioventricular block, complete: Secondary | ICD-10-CM

## 2023-03-30 DIAGNOSIS — N184 Chronic kidney disease, stage 4 (severe): Secondary | ICD-10-CM

## 2023-03-30 DIAGNOSIS — I4821 Permanent atrial fibrillation: Secondary | ICD-10-CM | POA: Diagnosis not present

## 2023-03-30 LAB — BASIC METABOLIC PANEL
Anion gap: 13 (ref 5–15)
BUN: 75 mg/dL — ABNORMAL HIGH (ref 8–23)
CO2: 25 mmol/L (ref 22–32)
Calcium: 8.4 mg/dL — ABNORMAL LOW (ref 8.9–10.3)
Chloride: 92 mmol/L — ABNORMAL LOW (ref 98–111)
Creatinine, Ser: 2.27 mg/dL — ABNORMAL HIGH (ref 0.61–1.24)
GFR, Estimated: 29 mL/min — ABNORMAL LOW (ref >60)
Glucose, Bld: 115 mg/dL — ABNORMAL HIGH (ref 70–99)
Potassium: 4.4 mmol/L (ref 3.5–5.1)
Sodium: 130 mmol/L — ABNORMAL LOW (ref 135–145)

## 2023-03-30 MED ORDER — METOLAZONE 2.5 MG PO TABS: ORAL_TABLET | ORAL | 3 refills | Status: DC

## 2023-03-30 MED ORDER — POTASSIUM CHLORIDE CRYS ER 20 MEQ PO TBCR: EXTENDED_RELEASE_TABLET | ORAL | 3 refills | Status: DC

## 2023-03-30 NOTE — Progress Notes (Signed)
[name], male    DOB: 03/14/1946, 77 y.o.   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 Moderate AS (mean gradient 20). Echo 8/20 EF 35-40% moderate AS/Jesus/TR. Severe LAE. Echo 2007 EF 55-60%  Myoview 2009 EF 55% inferior scar  Has not been admitted or been in the ED in the last 6 months.   He presents today for a HF f/u visit with a chief complaint of moderate fatigue with little exertion. Chronic in nature and he says that "just bathing" makes him tired. Has associated SOB, pedal edema, abdominal distention and difficulty sleeping along with this. Denies dizziness, palpitations, chest pain or cough.   At last visit, his diuretic was changed from furosemide to torsemide 60mg  daily with possible need for weekly metolazone. Potassium was also added. Also was instructed to use compression socks which his wife says that he will not wear because he says that they hurt his legs. Not elevating his legs much during the day.   Wife doesn't feel like his swelling is any better since the above changes and he's actually gained a few pounds.   Past Medical History:  Diagnosis Date   Anxiety    Aortic stenosis    a. mild on echo 04/2014   AV block, Mobitz 2    a. 03/2018 s/p SJM Rometta Emery MP RF model CD [id number] BiV PPM (ser #: [id number]).   Choledocholithiasis    Chronic  systolic CHF (congestive heart failure)    a. echo 04/2014: EF 45-50%, nl LV dia fxn, mild AS, LA mildly dilated, PASP nl; b. 07/2019 Echo: EF 35-40%, diff HK, Nl RV fxn, RVSP 53.79mmHg. Sev dil LA. Mod dil RA. Mild to mod Jesus. Mod TR. Mod AS. Ao root 37cm.   CKD (chronic kidney disease), stage IV    COPD (chronic obstructive pulmonary disease)    Not on home oxygen   Coronary artery disease    a. status post CABG in 2007; b. nuc stress test 2014: small region of mild ischemia in the inferior territory, EF 61%, no ischemia, scan similar to prior scan in 2009; c. 07/2016 MV: No ischemia.   Exogenous obesity    GERD (gastroesophageal reflux disease)    Gout    Hiatal hernia    Hip pain, chronic 08/03/2016   Waiting to have hip replacement pending NM stress test results per patient.   History of colonic diverticulitis    History of kidney stones    History of renal cell carcinoma    a. s/p laser ablation    Hyperlipidemia    Hypertension    Memory loss due to medical condition    possibly due to fluid on his brain   Moderate aortic stenosis    a. 07/2019 Echo: Mod AS.   Near syncope    a. 3 separate events in 2017   Nephrolithiasis    Panic attacks  Peripheral vascular disease    Permanent atrial fibrillation 06/06/2016   a. Dx 06/06/2016; b. CHADS2VASc => 5 (CHF, HTN, age x 1, DM, vascular disease)-->was on Eliquis but d/c'd 2/2 anemia and hemoptysis.   Presence of permanent cardiac pacemaker    Spinal stenosis    Temporomandibular joint pain dysfunction syndrome    Umbilical hernia    Vitamin D deficiency    Wegener's granulomatosis with renal involvement    Past Surgical History:  Procedure Laterality Date   CARDIAC CATHETERIZATION     CHOLECYSTECTOMY     COLONOSCOPY N/A 06/03/2015   Procedure: COLONOSCOPY;  Surgeon: Midge Minium, MD;  Location: [facility];  Service: Gastroenterology;  Laterality: N/A;   CORONARY ARTERY BYPASS GRAFT  2004   CABG x 3   EYE SURGERY      HERNIA REPAIR     x 2   HIP SURGERY     right hip replacement   INTRAOCULAR LENS INSERTION     KIDNEY SURGERY     cancer   NOSE SURGERY     PACEMAKER IMPLANT N/A 04/05/2018   SJM Biventricular pacemaker implanted by Dr Johney Frame for second degree AV block, EF < 50% and permanent afib   POLYPECTOMY  06/03/2015   Procedure: POLYPECTOMY;  Surgeon: Midge Minium, MD;  Location: [facility];  Service: Gastroenterology;;   PROSTATE SURGERY     TOTAL HIP ARTHROPLASTY Left 12/19/2016   Procedure: LEFT TOTAL HIP ARTHROPLASTY;  Surgeon: Valeria Batman, MD;  Location[facility] OR;  Service: Orthopedics;  Laterality: Left;   VENTRICULOPERITONEAL SHUNT Right 07/08/2021   Procedure: Shunt Placment - right;  Surgeon: Julio Sicks, MD;  Location: [facility] OR;  Service: Neurosurgery;  Laterality: Right;   Family History  Problem Relation Age of Onset   Heart disease Mother    Heart disease Sister    Heart disease Brother    Social History   Tobacco Use   Smoking status: Former    Packs/day: 0.50    Years: 25.00    Additional pack years: 0.00    Total pack years: 12.50    Types: Cigarettes    Quit date: 12/29/1974    Years since quitting: 48.2   Smokeless tobacco: Never  Substance Use Topics   Alcohol use: Not Currently    Alcohol/week: 2.0 standard drinks of alcohol    Types: 2 Cans of beer per week    Comment: social   Allergies  Allergen Reactions   Clonazepam Other (See Comments)    Patient is confused for up to 12+ hours after administration. (Patient is still taking)     Prior to Admission medications   Medication Sig Start Date End Date Taking? Authorizing Provider  acetaminophen (TYLENOL) 325 MG tablet Take 325-650 mg by mouth every 6 (six) hours as needed for pain.   Yes [provider]  clonazePAM (KLONOPIN) 1 MG tablet Take 1 mg by mouth as needed.   Yes [provider]  donepezil (ARICEPT) 5 MG tablet Take 5 mg by mouth at bedtime. 03/19/21  Yes [provider]  ELIQUIS 5 MG TABS tablet TAKE 1 TABLET(5 MG) BY MOUTH TWICE DAILY 01/26/23  Yes Gollan, Tollie Pizza, MD  famotidine (PEPCID) 20 MG tablet TAKE 1 TABLET(20 MG) BY MOUTH EVERY DAY AT BEDTIME AS NEEDED FOR HEART BURN 07/12/20  Yes Gollan, Tollie Pizza, MD  fluticasone (FLONASE) 50 MCG/ACT nasal spray Place 1-2 sprays into both nostrils daily as needed for allergies. 01/10/21  Yes [provider]  losartan (COZAAR) 25 MG tablet Take 1 tablet (25 mg total) by mouth daily. 03/05/23  Yes Furth, Cadence H, PA-C  meloxicam (MOBIC) 15 MG tablet Take 15 mg by mouth daily as needed for pain. 03/23/21  Yes [provider]  potassium chloride SA (KLOR-CON M) 20 MEQ tablet Take 1 tablet (20 mEq total) by mouth daily. 03/27/23  Yes Bensimhon, Bevelyn Buckles, MD  sertraline (ZOLOFT) 50 MG tablet Take 50 mg by mouth daily.   Yes [provider]  torsemide (DEMADEX) 20 MG tablet Take 3 tablets (60 mg total) by mouth 2 (two) times daily. 03/23/23  Yes Bensimhon, Bevelyn Buckles, MD  NON FORMULARY Take 1 capsule by mouth in the morning and at bedtime. Pure Encapsulations Uric Acid Formula    [provider]   Review of Systems  Constitutional:  Positive for fatigue. Negative for appetite change.  HENT:  Positive for hearing loss. Negative for congestion and postnasal drip.   Eyes: Negative.   Respiratory:  Positive for shortness of breath. Negative for cough and chest tightness.   Cardiovascular:  Positive for leg swelling. Negative for chest pain and palpitations.  Gastrointestinal:  Positive for abdominal distention. Negative for abdominal pain.  Endocrine: Negative.   Genitourinary: Negative.   Musculoskeletal: Negative.   Skin: Negative.   Allergic/Immunologic: Negative.   Neurological:  Negative for dizziness and light-headedness.  Hematological:  Negative for adenopathy. Bruises/bleeds easily.  Psychiatric/Behavioral:  Positive for sleep disturbance (sleeping during the day; up &  down all night). Negative for dysphoric mood. The patient is not nervous/anxious.    Vitals:   03/30/23 1008  BP: 123/64  Pulse: 62  SpO2: 96%  Weight: 240 lb 9.6 oz (109.1 kg)   Wt Readings from Last 3 Encounters:  03/30/23 240 lb 9.6 oz (109.1 kg)  03/23/23 235 lb 9.6 oz (106.9 kg)  03/20/23 246 lb 6.4 oz (111.8 kg)   Lab Results  Component Value Date   CREATININE 1.86 (H) 03/23/2023   CREATININE 2.32 (H) 07/08/2021   CREATININE 2.11 (H) 01/11/2021   Physical Exam Vitals and nursing note reviewed. Exam conducted with a chaperone present (wife).  Constitutional:      Appearance: Normal appearance.  HENT:     Head: Normocephalic and atraumatic.     Comments: Severe hearing loss    Right Ear: Decreased hearing noted.     Left Ear: Decreased hearing noted.  Cardiovascular:     Rate and Rhythm: Normal rate and regular rhythm.  Pulmonary:     Effort: Pulmonary effort is normal. No respiratory distress.     Breath sounds: No wheezing, rhonchi or rales.  Abdominal:     General: There is no distension.     Palpations: Abdomen is soft.     Tenderness: There is no abdominal tenderness.  Musculoskeletal:        General: No tenderness.     Cervical back: Normal range of motion and neck supple.     Right lower leg: Edema (1+ pitting to knee) present.     Left lower leg: Edema (2+ pitting to knee) present.  Skin:    General: Skin is warm and dry.     Findings: Bruising (on bilateral arms) present.  Neurological:     General: No focal deficit present.     Mental Status: He is alert and oriented to person, place, and time.  Psychiatric:        Mood and Affect: Mood normal.  Behavior: Behavior normal.   Assessment & Plan:  1: NICM with reduced ejection fraction- - NYHA class III - fluid overloaded with continued edema and weight gain - weighing daily - weight up 5 pounds from last visit here 1 week ago - Echo 2/22 EF 30-35%, global hypokinesis, moderately reduced RV  SF. Moderate AS (mean gradient 20). Echo 8/20 EF 35-40% moderate AS/Jesus/TR. Severe LAE. Echo 2007 EF 55-60% - Myoview 10/20 EF 37% inferior scar. No ischemia. Myoview 2009 EF 55% inferior scar - continue torsemide 60mg  BID/ potassium daily - continue losartan 25mg  daily - BMP today as he most likely will need metolazone; will call patient's wife once we obtain results - GDMT has been limited due to CKD and noncompliance.  - Not ICD candidate given poor functional capacity - saw [facility] provider (Bensimhon) 04/24; returns in 2 weeks  - unable to wear compression socks as he says that they hurt his legs - not elevating his legs much during the day and emphasized that he needed to elevate them - not adding salt and wife doesn't cook with salt - BMP 03/23/23 was 422.2  2. CAD- - s/p CABG 2007 - Myoview 10/20 EF 37% inferior scar. No ischemia - Off ASA due to Eliquis  - Unclear why he is not on statin - No s/s angina - saw cardiology Fransico Michael) 04/24   3. Aortic stenosis- - Moderate by echo in 2022 - Pending repeat echo on 04/12/23  4. Permanent AF- - on Eliquis.  - Rate controlled  5. H/o CHB s/p STJ CRT-P- - Followed by EP  6. CKD IV- - baseline SCr 2.3-2.5 - saw [facility] nephrology back in 2020 - ideally needs SGLT2i   7: HTN- - BP 123/64 - saw PCP Burnett Sheng) 03/24 - BMP 03/23/23 showed sodium 133, potassium 3.5, creatinine 1.86 & GFR 37  Return next week, sooner if needed.

## 2023-04-02 ENCOUNTER — Telehealth: Payer: Self-pay

## 2023-04-02 ENCOUNTER — Emergency Department
Admission: EM | Admit: 2023-04-02 | Discharge: 2023-04-18 | Disposition: E | Payer: Medicare Other | Attending: Student in an Organized Health Care Education/Training Program | Admitting: Student in an Organized Health Care Education/Training Program

## 2023-04-02 DIAGNOSIS — N189 Chronic kidney disease, unspecified: Secondary | ICD-10-CM | POA: Insufficient documentation

## 2023-04-02 DIAGNOSIS — R1084 Generalized abdominal pain: Secondary | ICD-10-CM | POA: Diagnosis not present

## 2023-04-02 DIAGNOSIS — R451 Restlessness and agitation: Secondary | ICD-10-CM | POA: Diagnosis not present

## 2023-04-02 DIAGNOSIS — K625 Hemorrhage of anus and rectum: Secondary | ICD-10-CM | POA: Diagnosis not present

## 2023-04-02 DIAGNOSIS — I469 Cardiac arrest, cause unspecified: Secondary | ICD-10-CM | POA: Diagnosis not present

## 2023-04-02 DIAGNOSIS — J449 Chronic obstructive pulmonary disease, unspecified: Secondary | ICD-10-CM | POA: Diagnosis not present

## 2023-04-02 DIAGNOSIS — R4182 Altered mental status, unspecified: Secondary | ICD-10-CM | POA: Diagnosis not present

## 2023-04-02 DIAGNOSIS — R55 Syncope and collapse: Secondary | ICD-10-CM | POA: Diagnosis not present

## 2023-04-02 DIAGNOSIS — R0689 Other abnormalities of breathing: Secondary | ICD-10-CM | POA: Diagnosis not present

## 2023-04-02 DIAGNOSIS — I509 Heart failure, unspecified: Secondary | ICD-10-CM | POA: Diagnosis not present

## 2023-04-02 DIAGNOSIS — R410 Disorientation, unspecified: Secondary | ICD-10-CM | POA: Diagnosis not present

## 2023-04-02 LAB — CBG MONITORING, ED: Glucose-Capillary: 51 mg/dL — ABNORMAL LOW (ref 70–99)

## 2023-04-03 MED ORDER — SODIUM BICARBONATE 8.4 % IV SOLN
INTRAVENOUS | Status: AC | PRN
  Administered 2023-04-02: 50 meq via INTRAVENOUS

## 2023-04-03 MED ORDER — SODIUM CHLORIDE 0.9 % IV SOLN
INTRAVENOUS | Status: AC | PRN
  Administered 2023-04-02: 1000 mL via INTRAVENOUS

## 2023-04-03 MED ORDER — LACTATED RINGERS IV SOLN
INTRAVENOUS | Status: AC | PRN
  Administered 2023-04-02: 999 mL/h via INTRAVENOUS

## 2023-04-03 MED ORDER — EPINEPHRINE 1 MG/10ML IJ SOSY
PREFILLED_SYRINGE | INTRAMUSCULAR | Status: AC | PRN
  Administered 2023-04-02 (×5): 1 mg via INTRAVENOUS

## 2023-04-03 MED ORDER — DEXTROSE 50 % IV SOLN
INTRAVENOUS | Status: AC | PRN
  Administered 2023-04-02: 1 via INTRAVENOUS

## 2023-04-03 MED FILL — Medication: Qty: 1 | Status: AC

## 2023-04-10 ENCOUNTER — Encounter: Payer: Medicare Other | Admitting: Family

## 2023-04-10 NOTE — Addendum Note (Signed)
Addended by: Elease Etienne A on: 04/10/2023 08:43 AM   Modules accepted: Level of Service

## 2023-04-10 NOTE — Progress Notes (Signed)
Remote pacemaker transmission.   

## 2023-04-12 ENCOUNTER — Other Ambulatory Visit: Payer: Medicare Other

## 2023-04-13 ENCOUNTER — Encounter: Payer: Medicare Other | Admitting: Internal Medicine

## 2023-04-18 NOTE — Code Documentation (Signed)
No pulse 

## 2023-04-18 NOTE — ED Triage Notes (Addendum)
Code cart at bedside; pt placed on monitor; pads applied; pt coded a few minutes before arrival to ER per EMS; Fam states per EMS that pt has not been doing well for 3 days and at that time per EMS per fam pt was refusing to come in to ER; hx cirrhosis; BP 70/28 per EMS; HR initially 70 per EMS at house; 75% RA; unable to get temp per EMS; 18g R ac; 1 epi; CPr for 3 minutes with EMS; epi in at 11:53am by ER staff; no advanced airway per family per EMS though they don't know of any specific wishes of pt nor paperwork; family is on way per EMS;  EDP Roxan Hockey at bedside immediately; resp therapist at bedside 11:54am; 500cc fluid bolus given by EMS; asystole no pulse at 11:55am; another epi in 11:56am; CPR continued by staff and Samuel Bouche applied at 11:56am; RRT and EDP Robinson in agreement for and currently placing airway; another epi in at 11:58am; airway placed by RRT at 11:59am 24 at teeth with color change noted; 12:00 another dose of epi given; amp of D50 given at 12:02; pulse check and epi given at 12:03 (pt in PEA currently). 20g IV placed by Vet RN at 12:01. NS 1L bolus finished and 1L bolus of LR about 400cc left until finished. Hold on epi per EDP Robinson 12:05. EDP Roxan Hockey completing [place] of abdomen at 12:08. Pulse check 12:08 no pulse; 12:10 another epi given. Pupils fixed per Beecher Mcardle RN 12:10; 12:12 pulse check and further [place] by EDP; hold on epi by EDP Robinson at 12:13; amp of Bicarb given at 12:15; pulse check scheduled for 12:19 per EDP Roxan Hockey verbal order; [place] of heart with no organized activity per EDP Robinson at 12:19. Pulse check completed by EDP Roxan Hockey; called at 12:20 by EDP Roxan Hockey.

## 2023-04-18 NOTE — ED Notes (Signed)
Pt's family denies any needs at this time aside from stating one more person will arrive soon to visit.

## 2023-04-18 NOTE — Code Documentation (Signed)
Patient time of death; called by EDP Roxan Hockey.

## 2023-04-18 NOTE — Code Documentation (Signed)
Lary RRT and 2nd RRT at bedside assisting with airway.

## 2023-04-18 NOTE — Progress Notes (Signed)
Pt intubated with 7.5 ET tube on first attempt, secured at 24 cm at lip.

## 2023-04-18 NOTE — Code Documentation (Signed)
No pulse PEA

## 2023-04-18 NOTE — Code Documentation (Signed)
20g IV placed by Vet RN at L fa

## 2023-04-18 NOTE — Code Documentation (Signed)
3 RN's, 2 NT's, EDP Robinson at bedside with RRT on way.

## 2023-04-18 NOTE — ED Notes (Signed)
Monitor applied in effort to get vitals if any obtainable.

## 2023-04-18 NOTE — Telephone Encounter (Signed)
pts daughter called stating that pt was pooping blood  and was refusing to be active all day.  Pts daughter was requesting to have [place] do a referral to hospice so that they could see him as soon as possible and to evaluate him. RN advised to  call EMS to take pt to the ED. Pt daughter aware, agreeable, and verbalized understanding

## 2023-04-18 NOTE — Code Documentation (Signed)
Pulse check asystole no pulse

## 2023-04-18 NOTE — Progress Notes (Signed)
This Chap responded to code Blue. Pt being actively attended by combined care team although no family at the moment. Jesus Hendrix will continue to monitor the situation as well as being available as need be.   03/27/2023 1200  Spiritual Encounters  Type of Visit Initial  Care provided to: Pt not available  Conversation partners present during encounter Nurse  Referral source Code page  Reason for visit Code  OnCall Visit No  Interventions  Spiritual Care Interventions Made Compassionate presence

## 2023-04-18 NOTE — ED Notes (Signed)
EDP Roxan Hockey notified in person that first nurse notified this RN that pt's family in family room now.

## 2023-04-18 NOTE — ED Notes (Signed)
Pt's family left bedside.

## 2023-04-18 NOTE — ED Notes (Signed)
Pt's family still awaiting arrival of other family member. Family denies any current needs.

## 2023-04-18 NOTE — ED Notes (Signed)
Pt's family to bedside with hospital chaplain.

## 2023-04-18 NOTE — ED Provider Notes (Signed)
[facility] Provider Note    None    (approximate)   History   No chief complaint on file.  Level V Caveat:  CPR in progress  HPI  Jesus Hendrix is a 77 y.o. male with extensive history of aortic stenosis, CHF, CKD, COPD, GERD presents to the ER for altered mental status, agitation reported 3 days of bright red blood per rectum.  EMS found the patient agitated requiring some calming agent in the form of Haldol for transport to ER as the patient was reportedly combative hypotensive and did not have capacity to refuse transport.  Reportedly family have been trying to get the patient to come for several days.  In transport patient became increasingly hypotensive and coded upon arrival to the ER.     Physical Exam   Triage Vital Signs: ED Triage Vitals  Enc Vitals Group     BP      Pulse      Resp      Temp      Temp src      SpO2      Weight      Height      Head Circumference      Peak Flow      Pain Score      Pain Loc      Pain Edu?      Excl. in GC?     Most recent vital signs: There were no vitals filed for this visit.   Constitutional: unresponsive Eyes: pupils fixed Head: Atraumatic. Nose: No congestion/rhinnorhea. Mouth/Throat: Mucous membranes are moist.   Neck: Painless ROM.  Cardiovascular:   pulseless Respiratory: equal with bagging  Gastrointestinal: Soft and nontender.  Musculoskeletal:  no deformity   ED Results / Procedures / Treatments   Labs (all labs ordered are listed, but only abnormal results are displayed) Labs Reviewed  CBG MONITORING, ED - Abnormal; Notable for the following components:      Result Value   Glucose-Capillary 51 (*)    All other components within normal limits     EKG     RADIOLOGY   EMERGENCY DEPARTMENT [place] CARDIAC EXAM "Study: Limited Ultrasound of the Heart and Pericardium"  INDICATIONS:Cardiac arrest Multiple views of the heart and pericardium were obtained in real-time  with a multi-frequency probe.  PERFORMED HM:CNOBSJ IMAGES ARCHIVED?: No LIMITATIONS:  Emergent procedure VIEWS USED: Subcostal 4 chamber INTERPRETATION: No coordinated contractions      PROCEDURES:  Critical Care performed: No  Procedure Name: Intubation Date/Time: 03/31/2023 12:29 PM  Performed by: Willy Eddy, MDPre-anesthesia Checklist: Patient identified, Patient being monitored, Emergency Drugs available, Timeout performed and Suction available Oxygen Delivery Method: Non-rebreather mask Preoxygenation: Pre-oxygenation with 100% oxygen Induction Type: Rapid sequence Ventilation: Mask ventilation without difficulty Laryngoscope Size: Glidescope and 3 Tube size: 7.5 mm Number of attempts: 1 Airway Equipment and Method: Video-laryngoscopy Placement Confirmation: ETT inserted through vocal cords under direct vision, CO2 detector and Breath sounds checked- equal and bilateral       MEDICATIONS ORDERED IN ED: Medications - No data to display   IMPRESSION / MDM / ASSESSMENT AND PLAN / ED COURSE  I reviewed the triage vital signs and the nursing notes.                              Differential diagnosis includes, but is not limited to, hypovolemia, shock, GI bleed, ACS, dissection, dysrhythmia  Patient  presenting to the ER for evaluation of symptoms as described above arriving with CPR in progress.  Patient intubated.  ACLS protocol followed.  Code cart was emptied of epinephrine without return of spontaneous circulation.  Patient with very poor prognosis given several days of bleeding altered mental status in the setting of extensive CHF.  Despite volume resuscitation O2 saturations continue to downtrend.  After 30 minutes of CPR with bedside ultrasound showing no organized cardiac motion with asystole on cardiac monitor resuscitative efforts were terminated.  Several attempts were made to contact family at the listed number.       FINAL CLINICAL IMPRESSION(S) /  ED DIAGNOSES   Final diagnoses:  Cardiac arrest     Rx / DC Orders   ED Discharge Orders     None        Note:  This document was prepared using Dragon voice recognition software and may include unintentional dictation errors.    Willy Eddy, MD 04-11-23 1229

## 2023-04-18 NOTE — Progress Notes (Signed)
Chap responded to Nurse notification that family is at the waiting room. This chap provided emotional and spiritual care to the family as the grieved the passing on of their loved one. Pt wife, daughter and Son in law were present. This chap escorted FAM to the room and offered a prayer of committal at the request of the daughter.   03/21/2023 1500  Spiritual Encounters  Type of Visit Follow up  Care provided to: [facility] partners present during encounter Nurse  Referral source Chaplain assessment  Reason for visit End-of-life  OnCall Visit No  Interventions  Spiritual Care Interventions Made Established relationship of care and support;Compassionate presence;Bereavement/grief support;Prayer  Intervention Outcomes  Outcomes Reduced isolation;Reduced anxiety;Awareness of support;Connection to spiritual care  Spiritual Care Plan  Spiritual Care Issues Still Outstanding No further spiritual care needs at this time (see row info)

## 2023-04-18 NOTE — Code Documentation (Signed)
ER staff met EMS staff in EMS bay at truck to help facilitate hand off; EMS staff remains on CPR

## 2023-04-18 DEATH — deceased

## 2023-04-19 ENCOUNTER — Ambulatory Visit: Payer: Medicare Other | Admitting: Medical

## 2023-05-25 ENCOUNTER — Telehealth: Payer: Self-pay

## 2023-05-25 NOTE — Telephone Encounter (Signed)
Wife left message on Voice Mail with questions regarding her husband's monitoring device.  When calling back, she was at the [facility] and could not talk.  Says she will call back at a later time.   Note: patient's husband passed away in 04/20/2023.   Forwarding to [facility], device CMA to monitor and follow up.

## 2023-05-29 NOTE — Telephone Encounter (Signed)
I spoke with the patient wife and she is going to call [employer] support for a return kit.

## 2023-12-25 NOTE — Unmapped (Signed)
error
